# Patient Record
Sex: Female | Born: 1945
Health system: Southern US, Community
[De-identification: ages and names within clinical notes are randomized; demographics above are authoritative.]

## PROBLEM LIST (undated history)

## (undated) DIAGNOSIS — I10 Essential (primary) hypertension: Secondary | ICD-10-CM

## (undated) DIAGNOSIS — C801 Malignant (primary) neoplasm, unspecified: Secondary | ICD-10-CM

## (undated) DIAGNOSIS — N289 Disorder of kidney and ureter, unspecified: Secondary | ICD-10-CM

## (undated) DIAGNOSIS — K224 Dyskinesia of esophagus: Secondary | ICD-10-CM

## (undated) DIAGNOSIS — R0602 Shortness of breath: Secondary | ICD-10-CM

## (undated) DIAGNOSIS — T884XXA Failed or difficult intubation, initial encounter: Secondary | ICD-10-CM

## (undated) DIAGNOSIS — K219 Gastro-esophageal reflux disease without esophagitis: Secondary | ICD-10-CM

## (undated) DIAGNOSIS — I499 Cardiac arrhythmia, unspecified: Secondary | ICD-10-CM

## (undated) DIAGNOSIS — F32A Depression, unspecified: Secondary | ICD-10-CM

## (undated) DIAGNOSIS — F419 Anxiety disorder, unspecified: Secondary | ICD-10-CM

## (undated) DIAGNOSIS — N059 Unspecified nephritic syndrome with unspecified morphologic changes: Secondary | ICD-10-CM

## (undated) DIAGNOSIS — E119 Type 2 diabetes mellitus without complications: Secondary | ICD-10-CM

## (undated) DIAGNOSIS — T4145XA Adverse effect of unspecified anesthetic, initial encounter: Secondary | ICD-10-CM

## (undated) DIAGNOSIS — K8689 Other specified diseases of pancreas: Secondary | ICD-10-CM

## (undated) DIAGNOSIS — G8929 Other chronic pain: Secondary | ICD-10-CM

## (undated) DIAGNOSIS — J302 Other seasonal allergic rhinitis: Secondary | ICD-10-CM

## (undated) DIAGNOSIS — M1990 Unspecified osteoarthritis, unspecified site: Secondary | ICD-10-CM

## (undated) DIAGNOSIS — N183 Chronic kidney disease, stage 3 unspecified: Secondary | ICD-10-CM

## (undated) DIAGNOSIS — M545 Low back pain, unspecified: Secondary | ICD-10-CM

## (undated) DIAGNOSIS — F329 Major depressive disorder, single episode, unspecified: Secondary | ICD-10-CM

## (undated) DIAGNOSIS — M797 Fibromyalgia: Secondary | ICD-10-CM

## (undated) DIAGNOSIS — C259 Malignant neoplasm of pancreas, unspecified: Secondary | ICD-10-CM

## (undated) HISTORY — PX: TONSILLECTOMY: SUR1361

## (undated) HISTORY — DX: Essential (primary) hypertension: I10

## (undated) HISTORY — PX: LUMBAR DISC SURGERY: SHX700

## (undated) HISTORY — PX: CARPAL TUNNEL RELEASE: SHX101

## (undated) HISTORY — PX: CYST EXCISION: SHX5701

## (undated) HISTORY — DX: Malignant (primary) neoplasm, unspecified: C80.1

## (undated) HISTORY — PX: JOINT REPLACEMENT: SHX530

## (undated) HISTORY — DX: Gastro-esophageal reflux disease without esophagitis: K21.9

## (undated) HISTORY — PX: APPENDECTOMY: SHX54

## (undated) HISTORY — PX: COLONOSCOPY: SHX5424

## (undated) HISTORY — PX: ABDOMINAL HYSTERECTOMY: SHX81

---

## 1999-01-24 ENCOUNTER — Other Ambulatory Visit: Admission: RE | Admit: 1999-01-24 | Discharge: 1999-01-24 | Payer: Self-pay | Admitting: Obstetrics and Gynecology

## 2000-03-31 ENCOUNTER — Encounter: Admission: RE | Admit: 2000-03-31 | Discharge: 2000-03-31 | Payer: Self-pay | Admitting: Obstetrics and Gynecology

## 2000-03-31 ENCOUNTER — Encounter: Payer: Self-pay | Admitting: Obstetrics and Gynecology

## 2000-06-23 DIAGNOSIS — T8859XA Other complications of anesthesia, initial encounter: Secondary | ICD-10-CM

## 2000-06-23 DIAGNOSIS — T884XXA Failed or difficult intubation, initial encounter: Secondary | ICD-10-CM

## 2000-06-23 HISTORY — DX: Other complications of anesthesia, initial encounter: T88.59XA

## 2000-06-23 HISTORY — PX: SKIN GRAFT: SHX250

## 2000-06-23 HISTORY — PX: SQUAMOUS CELL CARCINOMA EXCISION: SHX2433

## 2000-06-23 HISTORY — DX: Failed or difficult intubation, initial encounter: T88.4XXA

## 2001-01-20 ENCOUNTER — Ambulatory Visit (HOSPITAL_BASED_OUTPATIENT_CLINIC_OR_DEPARTMENT_OTHER): Admission: RE | Admit: 2001-01-20 | Discharge: 2001-01-20 | Payer: Self-pay | Admitting: *Deleted

## 2001-02-08 ENCOUNTER — Encounter: Admission: RE | Admit: 2001-02-08 | Discharge: 2001-02-08 | Payer: Self-pay | Admitting: Specialist

## 2001-02-08 ENCOUNTER — Encounter: Payer: Self-pay | Admitting: Specialist

## 2001-02-26 ENCOUNTER — Encounter: Admission: RE | Admit: 2001-02-26 | Discharge: 2001-02-26 | Payer: Self-pay | Admitting: Specialist

## 2001-02-26 ENCOUNTER — Encounter: Payer: Self-pay | Admitting: Specialist

## 2001-03-01 ENCOUNTER — Encounter (INDEPENDENT_AMBULATORY_CARE_PROVIDER_SITE_OTHER): Payer: Self-pay | Admitting: *Deleted

## 2001-03-01 ENCOUNTER — Ambulatory Visit (HOSPITAL_BASED_OUTPATIENT_CLINIC_OR_DEPARTMENT_OTHER): Admission: RE | Admit: 2001-03-01 | Discharge: 2001-03-01 | Payer: Self-pay | Admitting: Specialist

## 2001-05-14 ENCOUNTER — Encounter: Payer: Self-pay | Admitting: Obstetrics and Gynecology

## 2001-05-14 ENCOUNTER — Other Ambulatory Visit: Admission: RE | Admit: 2001-05-14 | Discharge: 2001-05-14 | Payer: Self-pay | Admitting: Obstetrics and Gynecology

## 2001-05-14 ENCOUNTER — Encounter: Admission: RE | Admit: 2001-05-14 | Discharge: 2001-05-14 | Payer: Self-pay | Admitting: Obstetrics and Gynecology

## 2002-05-16 ENCOUNTER — Ambulatory Visit (HOSPITAL_COMMUNITY): Admission: RE | Admit: 2002-05-16 | Discharge: 2002-05-16 | Payer: Self-pay | Admitting: Obstetrics and Gynecology

## 2002-05-16 ENCOUNTER — Encounter: Payer: Self-pay | Admitting: Obstetrics and Gynecology

## 2003-05-19 ENCOUNTER — Ambulatory Visit (HOSPITAL_COMMUNITY): Admission: RE | Admit: 2003-05-19 | Discharge: 2003-05-19 | Payer: Self-pay | Admitting: Obstetrics and Gynecology

## 2004-05-21 ENCOUNTER — Ambulatory Visit (HOSPITAL_COMMUNITY): Admission: RE | Admit: 2004-05-21 | Discharge: 2004-05-21 | Payer: Self-pay | Admitting: Obstetrics and Gynecology

## 2005-05-01 ENCOUNTER — Encounter (HOSPITAL_COMMUNITY): Admission: RE | Admit: 2005-05-01 | Discharge: 2005-05-31 | Payer: Self-pay | Admitting: Neurosurgery

## 2005-06-05 ENCOUNTER — Ambulatory Visit (HOSPITAL_COMMUNITY): Admission: RE | Admit: 2005-06-05 | Discharge: 2005-06-05 | Payer: Self-pay | Admitting: Obstetrics and Gynecology

## 2006-06-08 ENCOUNTER — Ambulatory Visit (HOSPITAL_COMMUNITY): Admission: RE | Admit: 2006-06-08 | Discharge: 2006-06-08 | Payer: Self-pay | Admitting: Family Medicine

## 2007-06-21 ENCOUNTER — Ambulatory Visit (HOSPITAL_COMMUNITY): Admission: RE | Admit: 2007-06-21 | Discharge: 2007-06-21 | Payer: Self-pay | Admitting: Family Medicine

## 2008-07-04 ENCOUNTER — Ambulatory Visit (HOSPITAL_COMMUNITY): Admission: RE | Admit: 2008-07-04 | Discharge: 2008-07-04 | Payer: Self-pay | Admitting: Family Medicine

## 2009-07-05 ENCOUNTER — Ambulatory Visit (HOSPITAL_COMMUNITY): Admission: RE | Admit: 2009-07-05 | Discharge: 2009-07-05 | Payer: Self-pay | Admitting: Family Medicine

## 2010-07-01 ENCOUNTER — Encounter
Admission: RE | Admit: 2010-07-01 | Discharge: 2010-07-01 | Payer: Self-pay | Source: Home / Self Care | Attending: Orthopedic Surgery | Admitting: Orthopedic Surgery

## 2010-07-12 ENCOUNTER — Inpatient Hospital Stay (HOSPITAL_COMMUNITY)
Admission: RE | Admit: 2010-07-12 | Discharge: 2010-07-12 | Payer: Self-pay | Source: Home / Self Care | Attending: Neurosurgery | Admitting: Neurosurgery

## 2010-07-13 ENCOUNTER — Inpatient Hospital Stay (HOSPITAL_COMMUNITY)
Admission: RE | Admit: 2010-07-13 | Discharge: 2010-07-13 | Payer: Self-pay | Source: Home / Self Care | Attending: Neurosurgery | Admitting: Neurosurgery

## 2010-07-15 LAB — SURGICAL PCR SCREEN: MRSA, PCR: NEGATIVE

## 2010-07-15 LAB — CBC
MCH: 29.7 pg (ref 26.0–34.0)
MCHC: 32.4 g/dL (ref 30.0–36.0)
Platelets: 236 10*3/uL (ref 150–400)
RDW: 13.1 % (ref 11.5–15.5)

## 2010-07-15 LAB — BASIC METABOLIC PANEL
CO2: 30 mEq/L (ref 19–32)
Calcium: 10.2 mg/dL (ref 8.4–10.5)
Creatinine, Ser: 0.9 mg/dL (ref 0.4–1.2)
Glucose, Bld: 203 mg/dL — ABNORMAL HIGH (ref 70–99)
Sodium: 139 mEq/L (ref 135–145)

## 2010-07-16 LAB — GLUCOSE, CAPILLARY
Glucose-Capillary: 234 mg/dL — ABNORMAL HIGH (ref 70–99)
Glucose-Capillary: 135 mg/dL — ABNORMAL HIGH (ref 70–99)
Glucose-Capillary: 151 mg/dL — ABNORMAL HIGH (ref 70–99)

## 2010-07-16 LAB — URINALYSIS, ROUTINE W REFLEX MICROSCOPIC
Urine Glucose, Fasting: 500 mg/dL — AB
Urobilinogen, UA: 0.2 mg/dL (ref 0.0–1.0)

## 2010-08-16 NOTE — Op Note (Signed)
  Patricia Austin, ZELLNER NO.:  [id number]  MEDICAL RECORD NO.:  [id number]          PATIENT TYPE:  INP  LOCATION:  3001                         FACILITY:  [facility]  PHYSICIAN:  Coletta Memos, M.D.     DATE OF BIRTH:  1945/10/30  DATE OF PROCEDURE:  07/13/2010 DATE OF DISCHARGE:                              OPERATIVE REPORT   PREOPERATIVE DIAGNOSES:  Displaced disk left L4-5, left L4 radiculopathy.  POSTOPERATIVE DIAGNOSES:  Displaced disk left L4-5, left L4 radiculopathy.  PROCEDURE:  Left L4-7 semihemilaminectomy with microdissection and diskectomy.  COMPLICATIONS:  None.  SURGEON:  Coletta Memos, MD  ASSISTANT:  Lovell Sheehan.  INDICATIONS:  Kevin Docter presented to the office with severe pain in the back and left lower extremity.  She had significant weakness in the left hip flexors and in the quadriceps.  Great difficulty taking a step on the left leg leading with the left leg.  I therefore recommended, she agreed to undergo operative decompression secondary to the fragment of disk which was easily seen on the MRI which she presented with.  OPERATIVE NOTE:  Ms. Breisch was taken to the operating room, intubated and placed under general anesthetic without difficulty.  She was rolled prone onto a Wilson frame and all pressure points were properly padded. Her back was prepped and she was draped in a sterile fashion.  I infiltrated 10 mL 0.5% lidocaine with 1:200,000 strength epinephrine into the lumbar region.  I opened the skin with #10 blade and took this down to the thoracolumbar fascia.  I exposed the lamina of L4 and L5 on the left side.  I then proceeded with semihemilaminectomy of L4 using a high-speed drill and Kerrison punch.  I removed ligamentum flavum which was somewhat thick hardened and redundant.  I removed that exposing the thecal sac.  I brought the microscope into the operative field.  I then able to identify the thecal sac, the disk space and what  was a palpable lump which was still contained rostral to the disk space.  I was able to see the L4 root.  I then opened that lump.  I then removed the fragments of disk material.  I used various size right angle probes to ensure that I was not leaving any disk material behind and I was not.  Dr. Lovell Sheehan assisted with the inspection and diskectomy.  I then satisfied myself that there was no more pressure in her nerve and also satisfied myself that there was no benefit to going into her disk space, although it was quite degenerated.  I irrigated the wound.  I then closed the wound in layered fashion using Vicryl sutures.  I used Dermabond for sterile dressing.          ______________________________ Coletta Memos, M.D.     KC/MEDQ  D:  07/13/2010  T:  07/13/2010  Job:  [id number]  Electronically Signed by Coletta Memos M.D. on 08/16/2010 09:20:34 AM

## 2010-08-18 NOTE — Discharge Summary (Signed)
  NAMECITLALLI, WEIKEL NO.:  [id number]  MEDICAL RECORD NO.:  [id number]           PATIENT TYPE:  LOCATION:                                 FACILITY:  PHYSICIAN:  Coletta Memos, M.D.     DATE OF BIRTH:  Aug 31, 1945  DATE OF ADMISSION: DATE OF DISCHARGE:                              DISCHARGE SUMMARY   ADMITTING DIAGNOSIS:  Metallic bur hole cover.  DISCHARGE DIAGNOSIS:  Metallic bur hole cover.  PROCEDURE:  Removal of metallic bur hole cover.  COMPLICATIONS:  None.  DISCHARGE STATUS:  Alive and well.  DISCHARGE DESTINATION:  Home.  MEDICATIONS:  None.  INSTRUCTIONS:  May wash scalp tomorrow.  Do not directly wash incision, water may hit it.  COMPLICATIONS AT DISCHARGE:  None.          ______________________________ Coletta Memos, M.D.     KC/MEDQ  D:  08/16/2010  T:  08/16/2010  Job:  [id number]  Electronically Signed by Coletta Memos M.D. on 08/18/2010 12:11:57 AM

## 2010-09-24 ENCOUNTER — Other Ambulatory Visit: Payer: Self-pay | Admitting: Neurosurgery

## 2010-09-24 DIAGNOSIS — M545 Low back pain: Secondary | ICD-10-CM

## 2010-09-30 ENCOUNTER — Other Ambulatory Visit: Payer: Self-pay

## 2010-09-30 ENCOUNTER — Ambulatory Visit
Admission: RE | Admit: 2010-09-30 | Discharge: 2010-09-30 | Disposition: A | Discharge status: Home / Self Care | Payer: Medicare Other | Source: Ambulatory Visit | Attending: Neurosurgery | Admitting: Neurosurgery

## 2010-09-30 DIAGNOSIS — M545 Low back pain: Secondary | ICD-10-CM

## 2010-09-30 MED ORDER — GADOBENATE DIMEGLUMINE 529 MG/ML IV SOLN
17.0000 mL | Freq: Once | INTRAVENOUS | Status: AC | PRN
  Administered 2010-09-30: 17 mL via INTRAVENOUS

## 2010-10-17 ENCOUNTER — Encounter (HOSPITAL_COMMUNITY)
Admission: RE | Admit: 2010-10-17 | Discharge: 2010-10-17 | Disposition: A | Discharge status: Home / Self Care | Payer: Medicare Other | Source: Ambulatory Visit | Attending: Neurosurgery | Admitting: Neurosurgery

## 2010-10-17 LAB — BASIC METABOLIC PANEL
GFR calc Af Amer: >60 mL/min (ref >60)
GFR calc non Af Amer: 59 mL/min — ABNORMAL LOW (ref >60)
Glucose, Bld: 233 mg/dL — ABNORMAL HIGH (ref 70–99)

## 2010-10-17 LAB — SURGICAL PCR SCREEN: MRSA, PCR: NEGATIVE

## 2010-10-17 LAB — CBC
HCT: 35.6 % — ABNORMAL LOW (ref 36.0–46.0)
Hemoglobin: 11.9 g/dL — ABNORMAL LOW (ref 12.0–15.0)
MCH: 30.8 pg (ref 26.0–34.0)
MCHC: 33.4 g/dL (ref 30.0–36.0)
MCV: 92.2 fL (ref 78.0–100.0)
Platelets: 251 10*3/uL (ref 150–400)

## 2010-10-23 ENCOUNTER — Observation Stay (HOSPITAL_COMMUNITY)
Admission: RE | Admit: 2010-10-23 | Discharge: 2010-10-23 | Disposition: A | Discharge status: Home / Self Care | Payer: Medicare Other | Source: Ambulatory Visit | Attending: Neurosurgery | Admitting: Neurosurgery

## 2010-10-23 ENCOUNTER — Inpatient Hospital Stay (HOSPITAL_COMMUNITY): Payer: Medicare Other

## 2010-10-23 DIAGNOSIS — Z01812 Encounter for preprocedural laboratory examination: Secondary | ICD-10-CM | POA: Insufficient documentation

## 2010-10-23 DIAGNOSIS — I10 Essential (primary) hypertension: Secondary | ICD-10-CM | POA: Insufficient documentation

## 2010-10-23 DIAGNOSIS — E119 Type 2 diabetes mellitus without complications: Secondary | ICD-10-CM | POA: Insufficient documentation

## 2010-10-23 DIAGNOSIS — E669 Obesity, unspecified: Secondary | ICD-10-CM | POA: Insufficient documentation

## 2010-10-23 DIAGNOSIS — M5126 Other intervertebral disc displacement, lumbar region: Principal | ICD-10-CM | POA: Insufficient documentation

## 2010-10-23 LAB — GLUCOSE, CAPILLARY: Glucose-Capillary: 202 mg/dL — ABNORMAL HIGH (ref 70–99)

## 2010-10-23 LAB — TYPE AND SCREEN
ABO/RH(D): A POS
Antibody Screen: NEGATIVE

## 2010-11-08 NOTE — Op Note (Signed)
NAMEJANAYLA, MARIK NO.:  [id number]  MEDICAL RECORD NO.:  [id number]           PATIENT TYPE:  O  LOCATION:  3524                         FACILITY:  [facility]  PHYSICIAN:  Coletta Memos, M.D.     DATE OF BIRTH:  May 26, 1946  DATE OF PROCEDURE:  10/23/2010 DATE OF DISCHARGE:  10/23/2010                              OPERATIVE REPORT   PREOPERATIVE DIAGNOSIS:  Recurrent disk herniation, left L4-L5.  POSTOPERATIVE DIAGNOSIS:  Recurrent disk herniation, left L4-L5.  PROCEDURE:  Redo left L4-L5 semi-hemilaminectomy and diskectomy with microdissection.  SURGEON:  Coletta Memos, MD  ASSISTANT:  Hewitt Shorts, MD  COMPLICATIONS:  None.  FINDINGS:  Fragment of disk above the disk space with significant amount of surrounding scar tissue.  INDICATIONS:  Patricia Austin is a 65 year old who in January underwent a lumbar diskectomy at L4-L5.  She had very good relief of pain until approximately 5 weeks ago.  She has severe recurrence of pain.  MRI showed a recurrent disk herniation.  We discussed in length fusion versus a simple redo diskectomy.  Patricia Austin made a decision to simply undergo a redo diskectomy.  OPERATIVE NOTE:  Patricia Austin was brought to the operating room, intubated, and placed under a general anesthetic.  She was rolled prone onto a Wilson frame and all pressure points were properly padded.  Her back was prepped and she was draped in a sterile fashion.  I opened the old incision with a #10 blade and took this down to the thoracolumbar fascia.  I then exposed the lamina of L3, L4, and L5.  I confirmed our location intraoperatively with x-ray.  I then used a curved curette to free the soft tissue from the bony edges.  I used a Kerrison punch to remove more bone from both L4 and the lateral interlaminar space at L4- L5.  I then performed some more of the medial facetectomy.  Again, I was able to then find the disk space in a layer of the membranous  tissue overlying the disk material which was not in the disk space.  I was able to remove that using blunt hooks and a pituitary rongeur.  Dr. Newell Austin assisted with the decompression.  We observed what we felt was the disk space.  The recurrent disk was not in the disk space but was migrated rostrally from the disk space.  I still did not believe in Patricia Austin's best interest to explore the disk space, so we left that alone.  The nerve root was well decompressed.  I then irrigated the wound.  I then placed some fentanyl over the resection site.  I then closed the wound in layered fashion using Vicryl sutures.  The operative microscope was used during the case to help with operative dissection, microdissection of the nerve root, and disk material.  I closed the wound in layered fashion using Vicryls.  I used Dermabond for sterile dressing.  Patricia Austin tolerated the procedure well.          ______________________________ Coletta Memos, M.D.     KC/MEDQ  D:  10/23/2010  T:  10/24/2010  Job:  [id number]  Electronically Signed by Coletta Memos M.D. on 11/08/2010 11:42:00 AM

## 2010-11-08 NOTE — Op Note (Signed)
[place]  Patient:    Patricia Austin, LOUREIRO Visit Number: [id number] MRN: [id number]          Service Type: Attending:  Yaakov Guthrie. Shon Hough, M.D. Dictated by:   Yaakov Guthrie. Shon Hough, M.D. Proc. Date: 03/01/01   CC:         Maisie Fus B. Samuella Cota, M.D.   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  The defect presently measures approximately 1.5 x 1.0-2.0 cm.  PROCEDURE:  Wide excision, bilateral horizontal flap advancements.  SURGEON:  Yaakov Guthrie. Shon Hough, M.D.  ASSISTANTS: Donnie Coffin. Samuella Cota, M.D. Margaretha Sheffield, R.N.  ANESTHESIA:  General.  DESCRIPTION OF PROCEDURE:  The patient underwent general anesthesia and intubated orally.  Prep was done to the face, scalp and neck area with Betadine soaping solution after hair had been shaven and walled off with sterile towels and drapes so as to make a sterile field.  Marking pin was used to outline a wide, wide excision of the area down to underlying periosteum. Specimen was sent to pathology for examination.  Hemostasis was maintained with the Bovie unit on coagulation after the areas had been locally injected with 0.5% Xylocaine with epinephrine 1:200,000 concentration, a total of 30 cc.  After proper hemostasis, bilateral horizontal flaps were outlined based over the temporalis artery.  Flaps were then freed out all the way to their bases and a small bat cut made on each side.  There was increased resistance to closure initially and after freeing up the flaps more, the galea was scored from inside with some of the musculature of the scalp to allow [place] to get more distance.  Closure was done with a 0 nylon, then subcutaneous closure was done with 0 Vicryl and then the skin reapproximated with skin staples.  The wounds were drained with the #10 Blake drain, which was placed in the depths of the wound and brought out through the  lateral-most incision at the lower part of the incisions.  The wounds were cleansed, sterile dressings applied.  She withstood the procedures very well and was then taken to recovery in excellent condition.  Estimated blood loss:  150 cc. Complications:  None. Dictated by:   Yaakov Guthrie. Shon Hough, M.D. Attending:  Yaakov Guthrie. Shon Hough, M.D. DD:  03/01/01 TD:  03/01/01 Job: [id number]

## 2010-11-28 NOTE — Discharge Summary (Signed)
  NAMEELVIRA, [name] NO.:  [id number]  MEDICAL RECORD NO.:  [id number]           PATIENT TYPE:  O  LOCATION:  3524                         FACILITY:  [facility]  PHYSICIAN:  Coletta Memos, M.D.     DATE OF BIRTH:  06/19/1946  DATE OF ADMISSION:  10/23/2010 DATE OF DISCHARGE:  10/23/2010                              DISCHARGE SUMMARY   ADMITTING DIAGNOSIS:  Recurrent disk herniation, left L4-L5.  DISCHARGE DIAGNOSIS:  Recurrent disk herniation, left L4-L5.  PROCEDURE:  Redo left L4-L5 semi-hemilaminectomy and diskectomy with microdissection.  COMPLICATIONS:  None.  DISCHARGE STATUS:  Alive and well.  DISCHARGE DESTINATION:  Home.  MEDICATIONS:  Percocet and Flexeril.  INDICATIONS:  [name] was admitted to the hospital secondary to a recurrent disk herniation at L4-L5.  She was given the option of possible fusion or just simple redo diskectomy.  She has opted for redo diskectomy.  Her procedure went well on the left side at L4-L5.  Postop, she continued to do well.  Wound was clean, dry, no signs of infection. At discharge, she was able to void and ambulate and tolerated a regular diet.  I will see her in the office again in 3-4 weeks.  She was given a instruction sheet.  No driving for 10 days.  No heavy lifting, bending, or twisting.          ______________________________ Coletta Memos, M.D.     KC/MEDQ  D:  11/08/2010  T:  11/09/2010  Job:  [id number]  Electronically Signed by Coletta Memos M.D. on 11/28/2010 03:08:25 PM

## 2011-12-12 ENCOUNTER — Other Ambulatory Visit: Payer: Self-pay | Admitting: Nurse Practitioner

## 2011-12-12 DIAGNOSIS — Z1231 Encounter for screening mammogram for malignant neoplasm of breast: Secondary | ICD-10-CM

## 2012-01-06 ENCOUNTER — Ambulatory Visit (HOSPITAL_COMMUNITY): Payer: Medicare Other

## 2012-01-22 ENCOUNTER — Ambulatory Visit (HOSPITAL_COMMUNITY)
Admission: RE | Admit: 2012-01-22 | Discharge: 2012-01-22 | Disposition: A | Discharge status: Home / Self Care | Payer: Medicare Other | Source: Ambulatory Visit | Attending: Nurse Practitioner | Admitting: Nurse Practitioner

## 2012-01-22 DIAGNOSIS — Z1231 Encounter for screening mammogram for malignant neoplasm of breast: Secondary | ICD-10-CM | POA: Insufficient documentation

## 2012-09-07 ENCOUNTER — Other Ambulatory Visit: Payer: Self-pay | Admitting: Nurse Practitioner

## 2012-09-07 DIAGNOSIS — E118 Type 2 diabetes mellitus with unspecified complications: Secondary | ICD-10-CM

## 2012-09-07 MED ORDER — ONETOUCH ULTRA SYSTEM W/DEVICE KIT: 1.0000 | PACK | Freq: Once | Status: DC

## 2012-10-11 ENCOUNTER — Other Ambulatory Visit: Payer: Self-pay | Admitting: Nurse Practitioner

## 2012-11-08 ENCOUNTER — Other Ambulatory Visit: Payer: Self-pay | Admitting: Nurse Practitioner

## 2012-11-10 ENCOUNTER — Other Ambulatory Visit: Payer: Self-pay | Admitting: Nurse Practitioner

## 2012-11-11 NOTE — Telephone Encounter (Signed)
LAST OV 2/14. 

## 2012-12-06 ENCOUNTER — Other Ambulatory Visit: Payer: Self-pay | Admitting: Nurse Practitioner

## 2013-01-05 ENCOUNTER — Telehealth: Payer: Self-pay | Admitting: Nurse Practitioner

## 2013-01-06 NOTE — Telephone Encounter (Signed)
Ok to wait until appointment for labs

## 2013-01-06 NOTE — Telephone Encounter (Signed)
Please advise 

## 2013-01-06 NOTE — Telephone Encounter (Signed)
Patient takes injections once a week., They are costing her 133.00. Wants to have labs done in the am before she goes out of town and if it doesn't appear to be working then she wants to stop because she can't afford it.

## 2013-01-06 NOTE — Telephone Encounter (Signed)
Patricia Austin put her on Bydureon 2mg /ml weekly.

## 2013-01-06 NOTE — Telephone Encounter (Signed)
Pts appt moved to earlier date- will ck labs then

## 2013-01-06 NOTE — Telephone Encounter (Signed)
What is she taking?

## 2013-01-13 ENCOUNTER — Telehealth: Payer: Self-pay | Admitting: Nurse Practitioner

## 2013-01-13 ENCOUNTER — Other Ambulatory Visit: Payer: Self-pay | Admitting: Nurse Practitioner

## 2013-01-14 ENCOUNTER — Telehealth: Payer: Self-pay | Admitting: Nurse Practitioner

## 2013-01-14 NOTE — Telephone Encounter (Signed)
Samples left in lab refrigerator of bydueron. We just need to know if she needs refill on other medications since we do not receive samples of those meds.

## 2013-01-14 NOTE — Telephone Encounter (Signed)
LMOM with details, sample available. Call pharmacy if refills needed.

## 2013-01-14 NOTE — Telephone Encounter (Signed)
Left details for pt, sample available,call pharmacy to request other medication refills.

## 2013-01-17 NOTE — Telephone Encounter (Signed)
No regular labs since 12/24/11, only one check with Marcelino Duster

## 2013-01-20 ENCOUNTER — Ambulatory Visit: Payer: Self-pay | Admitting: Nurse Practitioner

## 2013-02-03 ENCOUNTER — Telehealth: Payer: Self-pay | Admitting: Nurse Practitioner

## 2013-02-04 NOTE — Telephone Encounter (Signed)
appt made

## 2013-02-07 ENCOUNTER — Ambulatory Visit: Payer: Self-pay | Admitting: Nurse Practitioner

## 2013-02-09 ENCOUNTER — Encounter: Payer: Self-pay | Admitting: Nurse Practitioner

## 2013-02-09 ENCOUNTER — Ambulatory Visit (INDEPENDENT_AMBULATORY_CARE_PROVIDER_SITE_OTHER): Payer: Medicare Other | Admitting: Nurse Practitioner

## 2013-02-09 VITALS — BP 140/78 | HR 100 | Temp 97.5°F | Ht 63.0 in | Wt 196.5 lb

## 2013-02-09 DIAGNOSIS — K219 Gastro-esophageal reflux disease without esophagitis: Secondary | ICD-10-CM

## 2013-02-09 DIAGNOSIS — C4442 Squamous cell carcinoma of skin of scalp and neck: Secondary | ICD-10-CM

## 2013-02-09 DIAGNOSIS — M19079 Primary osteoarthritis, unspecified ankle and foot: Secondary | ICD-10-CM

## 2013-02-09 DIAGNOSIS — E119 Type 2 diabetes mellitus without complications: Secondary | ICD-10-CM | POA: Insufficient documentation

## 2013-02-09 DIAGNOSIS — J309 Allergic rhinitis, unspecified: Secondary | ICD-10-CM

## 2013-02-09 DIAGNOSIS — E785 Hyperlipidemia, unspecified: Secondary | ICD-10-CM | POA: Insufficient documentation

## 2013-02-09 DIAGNOSIS — M1710 Unilateral primary osteoarthritis, unspecified knee: Secondary | ICD-10-CM

## 2013-02-09 DIAGNOSIS — M19071 Primary osteoarthritis, right ankle and foot: Secondary | ICD-10-CM

## 2013-02-09 DIAGNOSIS — I10 Essential (primary) hypertension: Secondary | ICD-10-CM

## 2013-02-09 DIAGNOSIS — M170 Bilateral primary osteoarthritis of knee: Secondary | ICD-10-CM

## 2013-02-09 DIAGNOSIS — IMO0001 Reserved for inherently not codable concepts without codable children: Secondary | ICD-10-CM

## 2013-02-09 LAB — POCT GLYCOSYLATED HEMOGLOBIN (HGB A1C): Hemoglobin A1C: 7.3

## 2013-02-09 MED ORDER — GLIPIZIDE-METFORMIN HCL 2.5-500 MG PO TABS: ORAL_TABLET | ORAL | Status: DC

## 2013-02-09 MED ORDER — DULOXETINE HCL 60 MG PO CPEP: ORAL_CAPSULE | ORAL | Status: DC

## 2013-02-09 MED ORDER — HYDROCODONE-ACETAMINOPHEN 7.5-325 MG/15ML PO SOLN: 15.0000 mL | Freq: Four times a day (QID) | ORAL | Status: DC | PRN

## 2013-02-09 MED ORDER — CARVEDILOL 25 MG PO TABS: 25.0000 mg | ORAL_TABLET | Freq: Every day | ORAL | Status: DC

## 2013-02-09 MED ORDER — BENAZEPRIL HCL 40 MG PO TABS: 40.0000 mg | ORAL_TABLET | Freq: Every day | ORAL | Status: DC

## 2013-02-09 MED ORDER — DICLOFENAC SODIUM 75 MG PO TBEC: 75.0000 mg | DELAYED_RELEASE_TABLET | Freq: Two times a day (BID) | ORAL | Status: DC

## 2013-02-09 NOTE — Patient Instructions (Signed)

## 2013-02-09 NOTE — Addendum Note (Signed)
Addended by: Bennie Pierini on: 02/09/2013 04:36 PM   Modules accepted: Orders

## 2013-02-09 NOTE — Progress Notes (Signed)
Subjective:    Patient ID: Patricia Austin, female    DOB: 01-08-1946, 66 y.o.   MRN: [id number]  Hypertension This is a chronic problem. The current episode started more than 1 year ago. The problem is unchanged. The problem is uncontrolled (patient doesn't check at home.). Pertinent negatives include no blurred vision, chest pain, headaches, neck pain, palpitations, peripheral edema, shortness of breath or sweats. There are no associated agents to hypertension. Risk factors for coronary artery disease include diabetes mellitus, dyslipidemia, family history, obesity, post-menopausal state and sedentary lifestyle. Past treatments include beta blockers, ACE inhibitors and diuretics. The current treatment provides mild improvement. Compliance problems include diet and exercise.   Hyperlipidemia This is a chronic problem. The current episode started more than 1 year ago. The problem is uncontrolled. Recent lipid tests were reviewed and are high. There are no known factors aggravating her hyperlipidemia. Associated symptoms include myalgias. Pertinent negatives include no chest pain or shortness of breath. She is currently on no antihyperlipidemic treatment (patient has never wanted toi take.). Compliance problems include adherence to diet and adherence to exercise.  Risk factors for coronary artery disease include diabetes mellitus, family history, hypertension, obesity, post-menopausal and a sedentary lifestyle.  Diabetes She presents for her follow-up diabetic visit. She has type 2 diabetes mellitus. No MedicAlert identification noted. The initial diagnosis of diabetes was made 15 years ago. Her disease course has been fluctuating. There are no hypoglycemic associated symptoms. Pertinent negatives for hypoglycemia include no headaches or sweats. Pertinent negatives for diabetes include no blurred vision and no chest pain. There are no hypoglycemic complications. Symptoms are stable. There are no diabetic  complications. Risk factors for coronary artery disease include dyslipidemia, family history, hypertension, obesity, post-menopausal and sedentary lifestyle. Current diabetic treatment includes oral agent (dual therapy) (was on bydureon but has stopped due to expense.). She is compliant with treatment some of the time. Her weight is stable. When asked about meal planning, she reported none. She has not had a previous visit with a dietician. She rarely participates in exercise. Her home blood glucose trend is fluctuating minimally. Her breakfast blood glucose range is generally 130-140 mg/dl. Her highest blood glucose is >200 mg/dl. Her overall blood glucose range is 130-140 mg/dl. An ACE inhibitor/angiotensin II receptor blocker is being taken. She does not see a podiatrist.Eye exam is not current (2013).  Fibromyalgia/periperal neuropathy Cymbalta working ok but would like to increase dose because still has achiness all over- she also takes neurotin which helps some. * History of squamous cell carcinoma- needs follow up CT scan  Review of Systems  Constitutional: Negative.   HENT: Negative for neck pain.   Eyes: Negative.  Negative for blurred vision.  Respiratory: Negative.  Negative for shortness of breath.   Cardiovascular: Negative for chest pain and palpitations.  Genitourinary: Negative.   Musculoskeletal: Positive for myalgias, back pain and arthralgias.  Neurological: Negative for headaches.       Objective:   Physical Exam  Constitutional: She is oriented to person, place, and time. She appears well-developed and well-nourished.  HENT:  Nose: Nose normal.  Mouth/Throat: Oropharynx is clear and moist.  Eyes: EOM are normal.  Neck: Trachea normal, normal range of motion and full passive range of motion without pain. Neck supple. No JVD present. Carotid bruit is not present. No thyromegaly present.  Cardiovascular: Normal rate, regular rhythm, normal heart sounds and intact distal  pulses.  Exam reveals no gallop and no friction rub.   No murmur  heard. Pulmonary/Chest: Effort normal and breath sounds normal.  Abdominal: Soft. Bowel sounds are normal. She exhibits no distension and no mass. There is no tenderness.  Musculoskeletal: Normal range of motion.  Lymphadenopathy:    She has no cervical adenopathy.  Neurological: She is alert and oriented to person, place, and time. She has normal reflexes.  Skin: Skin is warm and dry.  Scalp scar tender on right- no edema- no discoloration  Psychiatric: She has a normal mood and affect. Her behavior is normal. Judgment and thought content normal.    BP 140/78  Pulse 100  Temp(Src) 97.5 F (36.4 C) (Oral)  Ht 5\' 3"  (1.6 m)  Wt 196 lb 8 oz (89.132 kg)  BMI 34.82 kg/m2 Results for orders placed in visit on 02/09/13  POCT GLYCOSYLATED HEMOGLOBIN (HGB A1C)      Result Value Range   Hemoglobin A1C 7.3%           Assessment & Plan:  1. HTN (hypertension) Low Na+ diet - CMP14+EGFR - carvedilol (COREG) 25 MG tablet; Take 1 tablet (25 mg total) by mouth daily.  Dispense: 30 tablet; Refill: 5 - benazepril (LOTENSIN) 40 MG tablet; Take 1 tablet (40 mg total) by mouth daily.  Dispense: 30 tablet; Refill: 5  2. Type II or unspecified type diabetes mellitus without mention of complication, uncontrolled Count crabs - POCT glycosylated hemoglobin (Hb A1C) - glipiZIDE-metformin (METAGLIP) 2.5-500 MG per tablet; 2 PO BID  Dispense: 120 tablet; Refill: 2 - POCT UA - Microalbumin  3. Morbid obesity Discussed diet  Encouraged exercsie  4. Hyperlipidemia with target LDL less than 100 Low fat diet - NMR, lipoprofile  5. Osteoarthritis of both feet Keep follow up with ortho  6. Osteoarthritis of both knees Rest when can - HYDROcodone-acetaminophen (HYCET) 7.5-325 mg/15 ml solution; Take 15 mL by mouth 4 (four) times daily as needed for pain.  Dispense: 120 mL; Refill: 0 - diclofenac (VOLTAREN) 75 MG EC tablet; Take 1  tablet (75 mg total) by mouth 2 (two) times daily.  Dispense: 60 tablet; Refill: 3  7. GERD (gastroesophageal reflux disease) Avoid spicy and fatty foods  8. Allergic rhinitis OTC meds as needed   9. Squamous cell carcinoma of scalp Will schedule scalp CT when patient returns from vacation  Mary-Margaret Daphine Deutscher, FNP

## 2013-02-10 ENCOUNTER — Other Ambulatory Visit: Payer: Self-pay | Admitting: Nurse Practitioner

## 2013-02-10 ENCOUNTER — Telehealth: Payer: Self-pay | Admitting: Nurse Practitioner

## 2013-02-10 MED ORDER — HYDROCODONE-ACETAMINOPHEN 5-325 MG PO TABS: 1.0000 | ORAL_TABLET | Freq: Four times a day (QID) | ORAL | Status: DC | PRN

## 2013-02-10 NOTE — Telephone Encounter (Signed)
Pt aware.

## 2013-02-10 NOTE — Telephone Encounter (Signed)
You were concerned with her blood pressure and intended to increase her meds. She has been taking one half pill in morning and same in the evening. Script for one daily didn't change anything. Her cymbalta cost her $127 which was twice the cost of before. It looks like you ordered a liquid hydrocone per meds but no printed script has come through.  Pt definitely wants hydrocodone pill (like the ones she took of her husband's). They are going out of town tomorrow. She hopes to pick up pain med script by morning.

## 2013-02-10 NOTE — Telephone Encounter (Signed)
rx ready for pick up for pain meds- Continue same dose of coreg for blood pressure- Sorry about cymbalta- once outpf donut hole it will be cheaper.

## 2013-02-11 LAB — NMR, LIPOPROFILE
HDL Cholesterol by NMR: 55 mg/dL (ref >=40)
LDL Size: 21.5 nm (ref >20.5)
LP-IR Score: <25 (ref <=45)
Small LDL Particle Number: 241 nmol/L (ref <=527)

## 2013-02-11 LAB — CMP14+EGFR
ALT: 22 IU/L (ref 0–32)
AST: 23 IU/L (ref 0–40)
Albumin/Globulin Ratio: 1.7 (ref 1.1–2.5)
Alkaline Phosphatase: 119 IU/L — ABNORMAL HIGH (ref 39–117)
BUN/Creatinine Ratio: 15 (ref 11–26)
CO2: 27 mmol/L (ref 18–29)
Calcium: 10.3 mg/dL — ABNORMAL HIGH (ref 8.6–10.2)
GFR calc non Af Amer: 54 mL/min/{1.73_m2} — ABNORMAL LOW (ref >59)
Potassium: 5.1 mmol/L (ref 3.5–5.2)
Sodium: 143 mmol/L (ref 134–144)

## 2013-02-22 MED ORDER — HYDROCODONE-ACETAMINOPHEN 5-325 MG PO TABS: 1.0000 | ORAL_TABLET | Freq: Four times a day (QID) | ORAL | Status: DC | PRN

## 2013-03-02 ENCOUNTER — Other Ambulatory Visit: Payer: Self-pay | Admitting: Family Medicine

## 2013-03-16 ENCOUNTER — Other Ambulatory Visit: Payer: Self-pay | Admitting: Pharmacist

## 2013-03-16 MED ORDER — EXENATIDE ER 2 MG ~~LOC~~ SUSR: 2.0000 mg | SUBCUTANEOUS | Status: DC

## 2013-04-06 ENCOUNTER — Ambulatory Visit (INDEPENDENT_AMBULATORY_CARE_PROVIDER_SITE_OTHER): Payer: Medicare Other | Admitting: General Practice

## 2013-04-06 VITALS — BP 140/83 | HR 75 | Temp 97.7°F | Wt 196.0 lb

## 2013-04-06 DIAGNOSIS — R739 Hyperglycemia, unspecified: Secondary | ICD-10-CM

## 2013-04-06 DIAGNOSIS — R7309 Other abnormal glucose: Secondary | ICD-10-CM

## 2013-04-06 LAB — POCT URINALYSIS DIPSTICK
Leukocytes, UA: NEGATIVE
Nitrite, UA: NEGATIVE
Urobilinogen, UA: NEGATIVE
pH, UA: 5

## 2013-04-06 LAB — GLUCOSE, POCT (MANUAL RESULT ENTRY): POC Glucose: 280 mg/dl — AB (ref 70–99)

## 2013-04-06 MED ORDER — INSULIN ASPART 100 UNIT/ML ~~LOC~~ SOLN: 10.0000 [IU] | Freq: Once | SUBCUTANEOUS | Status: DC

## 2013-04-06 NOTE — Progress Notes (Signed)
  Subjective:    Patient ID: Patricia Austin, female    DOB: December 09, 1945, 67 y.o.   MRN: [id number]  HPI Patient presents today with complaints of elevated blood sugars. Reports checking blood sugar 2-3 hours ago and reading was 375. She denies having bydureon nsulin to administer for two weeks. She reports taking oral antidiabetic medications as directed.     Review of Systems  Constitutional: Negative for fever and chills.  Respiratory: Negative for cough, chest tightness, shortness of breath and wheezing.   Cardiovascular: Negative for chest pain and palpitations.  Gastrointestinal: Negative for abdominal pain, diarrhea, constipation and blood in stool.  Endocrine: Negative for polydipsia, polyphagia and polyuria.  Genitourinary: Negative for dysuria, hematuria and difficulty urinating.  Musculoskeletal: Negative for back pain, neck pain and neck stiffness.  Neurological: Negative for dizziness, weakness and headaches.       Objective:   Physical Exam  Constitutional: She is oriented to person, place, and time. She appears well-developed and well-nourished.  HENT:  Head: Normocephalic and atraumatic.  Right Ear: External ear normal.  Left Ear: External ear normal.  Nose: Nose normal.  Mouth/Throat: Oropharynx is clear and moist.  Eyes: EOM are normal. Pupils are equal, round, and reactive to light.  Neck: Normal range of motion. Neck supple. No thyromegaly present.  Cardiovascular: Normal rate, regular rhythm and normal heart sounds.   Pulmonary/Chest: Effort normal and breath sounds normal. No respiratory distress. She exhibits no tenderness.  Abdominal: Soft. Bowel sounds are normal. She exhibits no distension. There is no tenderness.  Musculoskeletal: She exhibits no edema and no tenderness.  Lymphadenopathy:    She has no cervical adenopathy.  Neurological: She is alert and oriented to person, place, and time.  Skin: Skin is warm and dry.  Psychiatric: She has a normal mood and  affect.          Results for orders placed in visit on 04/06/13  POCT URINALYSIS DIPSTICK      Result Value Range   Color, UA gold     Clarity, UA clear     Glucose, UA 1+     Bilirubin, UA negative     Ketones, UA moderate     Spec Grav, UA >=1.030     Blood, UA negative     pH, UA 5.0     Protein, UA 4+     Urobilinogen, UA negative     Nitrite, UA negative     Leukocytes, UA Negative    GLUCOSE, POCT (MANUAL RESULT ENTRY)      Result Value Range   POC Glucose 280 (*) 70 - 99 mg/dl     Assessment & Plan:  1. Elevated blood sugar  - Urinalysis Dipstick - Glucose (CBG) - POCT glucose (manual entry) -discussed with patient urine results and with ketones present she should go hospital for evaluation -patient refuses to be evaluated at hospital - insulin aspart (novoLOG) injection 10 Units; Inject 10 Units into the skin once. -blood sugar rechecked and down to 255 -encouraged patient to hydrate -patient has bydureon to self administer -check blood sugar as directed once home -seek emergency medical treatment if symptoms worsen -Patient verbalized understanding -Coralie Keens, FNP-C

## 2013-04-19 ENCOUNTER — Telehealth: Payer: Self-pay | Admitting: Nurse Practitioner

## 2013-04-20 MED ORDER — EXENATIDE ER 2 MG ~~LOC~~ PEN: 2.0000 mg | PEN_INJECTOR | SUBCUTANEOUS | Status: DC

## 2013-04-20 NOTE — Telephone Encounter (Signed)
rx was faxed to [facility] and patient notified.

## 2013-05-02 ENCOUNTER — Telehealth: Payer: Self-pay | Admitting: Pharmacist

## 2013-05-02 NOTE — Telephone Encounter (Signed)
Received paper work from [employer].  Filled out and faxed today.

## 2013-05-05 ENCOUNTER — Other Ambulatory Visit: Payer: Self-pay | Admitting: Nurse Practitioner

## 2013-05-05 DIAGNOSIS — Z1231 Encounter for screening mammogram for malignant neoplasm of breast: Secondary | ICD-10-CM

## 2013-05-09 ENCOUNTER — Other Ambulatory Visit: Payer: Self-pay | Admitting: Nurse Practitioner

## 2013-05-10 ENCOUNTER — Other Ambulatory Visit: Payer: Self-pay | Admitting: Nurse Practitioner

## 2013-05-20 ENCOUNTER — Ambulatory Visit (HOSPITAL_COMMUNITY)
Admission: RE | Admit: 2013-05-20 | Discharge: 2013-05-20 | Disposition: A | Discharge status: Home / Self Care | Payer: Medicare Other | Source: Ambulatory Visit | Attending: Nurse Practitioner | Admitting: Nurse Practitioner

## 2013-05-20 DIAGNOSIS — Z1231 Encounter for screening mammogram for malignant neoplasm of breast: Secondary | ICD-10-CM | POA: Insufficient documentation

## 2013-05-27 ENCOUNTER — Ambulatory Visit (INDEPENDENT_AMBULATORY_CARE_PROVIDER_SITE_OTHER): Payer: Medicare Other | Admitting: Nurse Practitioner

## 2013-05-27 ENCOUNTER — Encounter: Payer: Self-pay | Admitting: Nurse Practitioner

## 2013-05-27 VITALS — BP 136/77 | HR 81 | Temp 98.1°F | Ht 63.0 in | Wt 195.0 lb

## 2013-05-27 DIAGNOSIS — I10 Essential (primary) hypertension: Secondary | ICD-10-CM

## 2013-05-27 DIAGNOSIS — IMO0001 Reserved for inherently not codable concepts without codable children: Secondary | ICD-10-CM

## 2013-05-27 DIAGNOSIS — E785 Hyperlipidemia, unspecified: Secondary | ICD-10-CM

## 2013-05-27 DIAGNOSIS — K219 Gastro-esophageal reflux disease without esophagitis: Secondary | ICD-10-CM

## 2013-05-27 DIAGNOSIS — Z8589 Personal history of malignant neoplasm of other organs and systems: Secondary | ICD-10-CM

## 2013-05-27 MED ORDER — CARVEDILOL 25 MG PO TABS: 25.0000 mg | ORAL_TABLET | Freq: Two times a day (BID) | ORAL | Status: DC

## 2013-05-27 MED ORDER — AMLODIPINE BESYLATE 5 MG PO TABS: 5.0000 mg | ORAL_TABLET | Freq: Every day | ORAL | Status: DC

## 2013-05-27 NOTE — Progress Notes (Addendum)
Subjective:    Patient ID: Patricia Austin, female    DOB: 1945-12-15, 67 y.o.   MRN: [id number]  Hypertension This is a chronic problem. The current episode started more than 1 year ago. The problem is unchanged. The problem is uncontrolled (Blood presuure at home has been running in the 170's systolic.). Pertinent negatives include no blurred vision, chest pain, headaches, neck pain, palpitations, peripheral edema, shortness of breath or sweats. There are no associated agents to hypertension. Risk factors for coronary artery disease include diabetes mellitus, dyslipidemia, family history, obesity, post-menopausal state and sedentary lifestyle. Past treatments include beta blockers, ACE inhibitors and diuretics. The current treatment provides mild improvement. Compliance problems include diet and exercise.   Hyperlipidemia This is a chronic problem. The current episode started more than 1 year ago. The problem is uncontrolled. Recent lipid tests were reviewed and are high. There are no known factors aggravating her hyperlipidemia. Associated symptoms include myalgias. Pertinent negatives include no chest pain or shortness of breath. She is currently on no antihyperlipidemic treatment (patient has never wanted toi take.). Compliance problems include adherence to diet and adherence to exercise.  Risk factors for coronary artery disease include diabetes mellitus, family history, hypertension, obesity, post-menopausal and a sedentary lifestyle.  Diabetes She presents for her follow-up diabetic visit. She has type 2 diabetes mellitus. No MedicAlert identification noted. The initial diagnosis of diabetes was made 15 years ago. Her disease course has been fluctuating. There are no hypoglycemic associated symptoms. Pertinent negatives for hypoglycemia include no headaches or sweats. Pertinent negatives for diabetes include no blurred vision and no chest pain. There are no hypoglycemic complications. Symptoms are  stable. There are no diabetic complications. Risk factors for coronary artery disease include dyslipidemia, family history, hypertension, obesity, post-menopausal and sedentary lifestyle. Current diabetic treatment includes oral agent (dual therapy) (Ran out of byduren and blood sugars got out of control- started back on bydureon 3 weeks ago and blood sugars have really come down.). She is compliant with treatment some of the time. Her weight is stable. When asked about meal planning, she reported none. She has not had a previous visit with a dietician. She rarely participates in exercise. Her home blood glucose trend is fluctuating minimally. Her breakfast blood glucose range is generally 180-200 mg/dl. Her highest blood glucose is >200 mg/dl. Her overall blood glucose range is >200 mg/dl. An ACE inhibitor/angiotensin II receptor blocker is being taken. She does not see a podiatrist.Eye exam is not current (2013).  Fibromyalgia/periperal neuropathy Cymbalta working ok but would like to increase dose because still has achiness all over- she also takes neurotin which helps some.   Review of Systems  Constitutional: Negative.   Eyes: Negative.  Negative for blurred vision.  Respiratory: Negative.  Negative for shortness of breath.   Cardiovascular: Negative for chest pain and palpitations.  Genitourinary: Negative.   Musculoskeletal: Positive for arthralgias, back pain and myalgias. Negative for neck pain.  Neurological: Negative for headaches.       Objective:   Physical Exam  Constitutional: She is oriented to person, place, and time. She appears well-developed and well-nourished.  HENT:  Nose: Nose normal.  Mouth/Throat: Oropharynx is clear and moist.  Eyes: EOM are normal.  Neck: Trachea normal, normal range of motion and full passive range of motion without pain. Neck supple. No JVD present. Carotid bruit is not present. No thyromegaly present.  Cardiovascular: Normal rate, regular rhythm,  normal heart sounds and intact distal pulses.  Exam reveals no  gallop and no friction rub.   No murmur heard. Pulmonary/Chest: Effort normal and breath sounds normal.  Abdominal: Soft. Bowel sounds are normal. She exhibits no distension and no mass. There is no tenderness.  Musculoskeletal: Normal range of motion.  Lymphadenopathy:    She has no cervical adenopathy.  Neurological: She is alert and oriented to person, place, and time. She has normal reflexes.  Skin: Skin is warm and dry.  Scalp scar tender on right- no edema- no discoloration  Psychiatric: She has a normal mood and affect. Her behavior is normal. Judgment and thought content normal.    BP 136/77  Pulse 81  Temp(Src) 98.1 F (36.7 C) (Oral)  Ht 5\' 3"  (1.6 m)  Wt 195 lb (88.451 kg)  BMI 34.55 kg/m2 Results for orders placed in visit on 05/27/13  POCT GLYCOSYLATED HEMOGLOBIN (HGB A1C)      Result Value Range   Hemoglobin A1C 9.7           Assessment & Plan:  1. HTN (hypertension) Low Na+ diet - CMP14+EGFR - coreg 25mg  1 po BID #60 5 refills\-amlodipine 5mg  1 po qd #30 5 refills  2. Type II or unspecified type diabetes mellitus without mention of complication, uncontrolled Count crabs Keep diary of blood sugars Stay on bydureon- if can't afford willl have to go on lantus daily - POCT glycosylated hemoglobin (Hb A1C) - glipiZIDE-metformin (METAGLIP) 2.5-500 MG per tablet; 2 PO BID  Dispense: 120 tablet; Refill: 2 - POCT UA - Microalbumin  3. Morbid obesity Discussed diet  Encouraged exercsie  4. Hyperlipidemia with target LDL less than 100 Low fat diet - NMR, lipoprofile  5. Osteoarthritis of both feet Keep follow up with ortho  6. Osteoarthritis of both knees Rest when can  -7. GERD (gastroesophageal reflux disease) Avoid spicy and fatty foods  8. Allergic rhinitis OTC meds as needed   9. Squamous cell carcinoma of scalp Will schedule scalp CT   Mary-Margaret Daphine Deutscher, FNP

## 2013-05-27 NOTE — Patient Instructions (Signed)

## 2013-05-27 NOTE — Addendum Note (Signed)
Addended by: Bennie Pierini on: 05/27/2013 11:42 AM   Modules accepted: Orders

## 2013-05-29 LAB — CMP14+EGFR
AST: 21 IU/L (ref 0–40)
Albumin: 4 g/dL (ref 3.6–4.8)
Alkaline Phosphatase: 105 IU/L (ref 39–117)
BUN/Creatinine Ratio: 15 (ref 11–26)
Chloride: 100 mmol/L (ref 97–108)
GFR calc Af Amer: 68 mL/min/{1.73_m2} (ref >59)
Potassium: 5.2 mmol/L (ref 3.5–5.2)
Total Bilirubin: 0.2 mg/dL (ref 0.0–1.2)

## 2013-05-29 LAB — NMR, LIPOPROFILE
Cholesterol: 184 mg/dL (ref <200)
LDL Particle Number: 1193 nmol/L — ABNORMAL HIGH (ref <1000)
LDL Size: 21.2 nm (ref >20.5)
LDLC SERPL CALC-MCNC: 100 mg/dL — ABNORMAL HIGH (ref <100)
LP-IR Score: 33 (ref <=45)
Small LDL Particle Number: 479 nmol/L (ref <=527)

## 2013-06-02 ENCOUNTER — Ambulatory Visit
Admission: RE | Admit: 2013-06-02 | Discharge: 2013-06-02 | Disposition: A | Discharge status: Home / Self Care | Payer: Medicare Other | Source: Ambulatory Visit | Attending: Nurse Practitioner | Admitting: Nurse Practitioner

## 2013-06-02 DIAGNOSIS — Z8589 Personal history of malignant neoplasm of other organs and systems: Secondary | ICD-10-CM

## 2013-06-02 MED ORDER — IOHEXOL 300 MG/ML  SOLN: 75.0000 mL | Freq: Once | INTRAMUSCULAR | Status: AC | PRN

## 2013-06-08 ENCOUNTER — Telehealth: Payer: Self-pay | Admitting: *Deleted

## 2013-06-08 NOTE — Telephone Encounter (Signed)
Home health nurse said patient's urine was 4+ glucose.  She  (Patricia Austin), thought you should know.

## 2013-06-09 NOTE — Telephone Encounter (Signed)
What are blood sugars running?

## 2013-06-13 NOTE — Telephone Encounter (Signed)
ok 

## 2013-06-13 NOTE — Telephone Encounter (Signed)
Patient has not been checking it in a few days but the last time she checked it it was below 200 she will call back tom with some reading she said

## 2013-07-11 ENCOUNTER — Telehealth: Payer: Self-pay | Admitting: *Deleted

## 2013-07-11 NOTE — Telephone Encounter (Signed)
Pt notified that we received Bydureon pen inj 2mg  #12 from [employer] patient assistance program. She will have her husband pick up today. This was supposed to be shipped to her house but she said they called and said it would have to be shipped to our office.

## 2013-08-16 ENCOUNTER — Other Ambulatory Visit: Payer: Self-pay | Admitting: Family Medicine

## 2013-08-16 ENCOUNTER — Other Ambulatory Visit: Payer: Self-pay | Admitting: Nurse Practitioner

## 2013-09-14 ENCOUNTER — Other Ambulatory Visit: Payer: Self-pay | Admitting: Nurse Practitioner

## 2013-09-19 ENCOUNTER — Other Ambulatory Visit: Payer: Self-pay | Admitting: Pharmacist

## 2013-09-19 MED ORDER — EXENATIDE ER 2 MG ~~LOC~~ PEN: 2.0000 mg | PEN_INJECTOR | SUBCUTANEOUS | Status: DC

## 2013-09-22 ENCOUNTER — Telehealth: Payer: Self-pay | Admitting: Pharmacist

## 2013-09-22 ENCOUNTER — Other Ambulatory Visit: Payer: Self-pay | Admitting: Pharmacist

## 2013-09-22 MED ORDER — EXENATIDE ER 2 MG ~~LOC~~ PEN: 2.0000 mg | PEN_INJECTOR | SUBCUTANEOUS | Status: DC

## 2013-09-22 NOTE — Telephone Encounter (Signed)
I printed out Rx again and refaxed. Patient notified via VM

## 2013-09-28 ENCOUNTER — Telehealth: Payer: Self-pay | Admitting: Pharmacist

## 2013-10-03 NOTE — Telephone Encounter (Signed)
rx was signed by Dr Laurance Flatten and faxed back to [employer] patient assistance program. [place] program [phone].  Confirmed that they received rx.

## 2013-10-05 ENCOUNTER — Telehealth: Payer: Self-pay | Admitting: *Deleted

## 2013-10-05 NOTE — Telephone Encounter (Signed)
Pt notified that we received her indigent medication of Bydereon pens 2mg  #12 pens

## 2013-10-19 ENCOUNTER — Encounter: Payer: Self-pay | Admitting: Nurse Practitioner

## 2013-10-19 ENCOUNTER — Ambulatory Visit (INDEPENDENT_AMBULATORY_CARE_PROVIDER_SITE_OTHER): Payer: Medicare Other

## 2013-10-19 ENCOUNTER — Ambulatory Visit (INDEPENDENT_AMBULATORY_CARE_PROVIDER_SITE_OTHER): Payer: Medicare Other | Admitting: Nurse Practitioner

## 2013-10-19 VITALS — BP 108/64 | HR 81 | Temp 98.4°F | Ht 63.0 in | Wt 198.0 lb

## 2013-10-19 DIAGNOSIS — I10 Essential (primary) hypertension: Secondary | ICD-10-CM

## 2013-10-19 DIAGNOSIS — E785 Hyperlipidemia, unspecified: Secondary | ICD-10-CM

## 2013-10-19 DIAGNOSIS — E1165 Type 2 diabetes mellitus with hyperglycemia: Secondary | ICD-10-CM

## 2013-10-19 DIAGNOSIS — IMO0001 Reserved for inherently not codable concepts without codable children: Secondary | ICD-10-CM

## 2013-10-19 DIAGNOSIS — K219 Gastro-esophageal reflux disease without esophagitis: Secondary | ICD-10-CM

## 2013-10-19 LAB — POCT GLYCOSYLATED HEMOGLOBIN (HGB A1C)

## 2013-10-19 NOTE — Patient Instructions (Signed)
Premature Beats °A premature beat is an extra heartbeat that happens earlier than normal. Premature beats are called premature atrial contractions (PACs) or premature ventricular contractions (PVCs) depending on the area of the heart where they start. °CAUSES  °Premature beats may be brought on by a variety of factors including: °· Emotional stress. °· Lack of sleep. °· Caffeine. °· Asthma medicines. °· Stimulants. °· Herbal teas. °· Dietary supplements. °· Alcohol. °In most cases, premature beats are not dangerous and are not a sign of serious heart disease. Most patients evaluated for premature beats have completely normal heart function. Rarely, premature beats may be a sign of more significant heart problems or medical illness. °SYMPTOMS  °Premature beats may cause palpitations. This means you feel like your heart is skipping a beat or beating harder than usual. Sometimes, slight chest pain occurs with premature beats, lasting only a few seconds. This pain has been described as a "flopping" feeling inside the chest. In many cases, premature beats do not cause any symptoms and they are only detected when an electrocardiography test (EKG) or heart monitoring is performed. °DIAGNOSIS  °Your caregiver may run some tests to evaluate your heart such as an EKG or echocardiography. You may need to wear a portable heart monitor for several days to record the electrical activity of your heart. Blood testing may also be performed to check your electrolytes and thyroid function. °TREATMENT  °Premature beats usually go away with rest. If the problem continues, your caregiver will determine a treatment plan for you.  °HOME CARE INSTRUCTIONS °· Get plenty of rest over the next few days until your symptoms improve. °· Avoid coffee, tea, alcohol, and soda (pop, cola). °· Do not smoke. °SEEK MEDICAL CARE IF: °· Your symptoms continue after 1 to 2 days of rest. °· You have new symptoms, such as chest pain or trouble  breathing. °SEEK IMMEDIATE MEDICAL CARE IF: °· You have severe chest pain or abdominal pain. °· You have pain that radiates into the neck, arm, or jaw. °· You faint or have extreme weakness. °· You have shortness of breath. °· Your heartbeat races for more than 5 seconds. °MAKE SURE YOU: °· Understand these instructions. °· Will watch your condition. °· Will get help right away if you are not doing well or get worse. °Document Released: 07/17/2004 Document Revised: 09/01/2011 Document Reviewed: 02/10/2011 °Exit[employer]® Patient Information ©2014 [employer]. ° °

## 2013-10-19 NOTE — Progress Notes (Addendum)
Subjective:    Patient ID: Patricia Austin, female    DOB: May 18, 1946, 68 y.o.   MRN: [id number]  Patient here today for follow up of chronic medical problems.  Hypertension This is a chronic problem. The current episode started more than 1 year ago. The problem is unchanged. The problem is uncontrolled (patient doesn't check at home.). Pertinent negatives include no blurred vision, chest pain, headaches, neck pain, palpitations, peripheral edema, shortness of breath or sweats. There are no associated agents to hypertension. Risk factors for coronary artery disease include diabetes mellitus, dyslipidemia, family history, obesity, post-menopausal state and sedentary lifestyle. Past treatments include beta blockers, ACE inhibitors and diuretics. The current treatment provides mild improvement. Compliance problems include diet and exercise.   Hyperlipidemia This is a chronic problem. The current episode started more than 1 year ago. The problem is uncontrolled. Recent lipid tests were reviewed and are high. There are no known factors aggravating her hyperlipidemia. Associated symptoms include myalgias. Pertinent negatives include no chest pain or shortness of breath. She is currently on no antihyperlipidemic treatment (patient has never wanted toi take.). Compliance problems include adherence to diet and adherence to exercise.  Risk factors for coronary artery disease include diabetes mellitus, family history, hypertension, obesity, post-menopausal and a sedentary lifestyle.  Diabetes She presents for her follow-up diabetic visit. She has type 2 diabetes mellitus. No MedicAlert identification noted. The initial diagnosis of diabetes was made 15 years ago. Her disease course has been fluctuating. There are no hypoglycemic associated symptoms. Pertinent negatives for hypoglycemia include no headaches or sweats. Pertinent negatives for diabetes include no blurred vision and no chest pain. There are no hypoglycemic  complications. Symptoms are stable. There are no diabetic complications. Risk factors for coronary artery disease include dyslipidemia, family history, hypertension, obesity, post-menopausal and sedentary lifestyle. Current diabetic treatment includes oral agent (dual therapy) (Back on bydureon and is helping some.). She is compliant with treatment some of the time. Her weight is stable. When asked about meal planning, she reported none. She has not had a previous visit with a dietician. She rarely participates in exercise. Her home blood glucose trend is fluctuating minimally. Her breakfast blood glucose range is generally 130-140 mg/dl. Her highest blood glucose is >200 mg/dl. Her overall blood glucose range is 130-140 mg/dl. An ACE inhibitor/angiotensin II receptor blocker is being taken. She does not see a podiatrist.Eye exam is not current (2013).  Fibromyalgia/periperal neuropathy Cymbalta working ok but would like to increase dose because still has achiness all over- she also takes neurotin which helps some.  * feels like heart is "flip-flopping" occurs off and on everyday.  Review of Systems  Constitutional: Negative.   Eyes: Negative.  Negative for blurred vision.  Respiratory: Negative.  Negative for shortness of breath.   Cardiovascular: Negative for chest pain and palpitations.  Genitourinary: Negative.   Musculoskeletal: Positive for arthralgias, back pain and myalgias. Negative for neck pain.  Neurological: Negative for headaches.       Objective:   Physical Exam  Constitutional: She is oriented to person, place, and time. She appears well-developed and well-nourished.  HENT:  Nose: Nose normal.  Mouth/Throat: Oropharynx is clear and moist.  Eyes: EOM are normal.  Neck: Trachea normal, normal range of motion and full passive range of motion without pain. Neck supple. No JVD present. Carotid bruit is not present. No thyromegaly present.  Cardiovascular: Normal rate, regular  rhythm, normal heart sounds and intact distal pulses.  Exam reveals no gallop and  no friction rub.   No murmur heard. Pulmonary/Chest: Effort normal and breath sounds normal.  Abdominal: Soft. Bowel sounds are normal. She exhibits no distension and no mass. There is no tenderness.  Musculoskeletal: Normal range of motion.  Lymphadenopathy:    She has no cervical adenopathy.  Neurological: She is alert and oriented to person, place, and time. She has normal reflexes.  Skin: Skin is warm and dry.  Scalp scar tender on right- no edema- no discoloration  Psychiatric: She has a normal mood and affect. Her behavior is normal. Judgment and thought content normal.    BP 108/64  Pulse 81  Temp(Src) 98.4 F (36.9 C) (Oral)  Ht $R'5\' 3"'hG$  (1.6 m)  Wt 198 lb (89.812 kg)  BMI 35.08 kg/m2 Results for orders placed in visit on 10/19/13  POCT GLYCOSYLATED HEMOGLOBIN (HGB A1C)      Result Value Ref Range   Hemoglobin A1C 8.0%      EKG- NSR with premature beats-Mary-Margaret Hassell Done, FNP  Chest xray- unchanged from previous-Preliminary reading by Ronnald Collum, FNP  [facility]     Assessment & Plan:   1. Hyperlipidemia with target LDL less than 100   2. Hypertension   3. Type II or unspecified type diabetes mellitus without mention of complication, uncontrolled   4. Morbid obesity   5. GERD (gastroesophageal reflux disease)    Orders Placed This Encounter  Procedures  . CMP14+EGFR  . NMR, lipoprofile  . POCT glycosylated hemoglobin (Hb A1C)   Hgba1c is improving so we will continue current  meds and recheck in 3  months Labs pending Health maintenance reviewed Diet and exercise encouraged Continue all meds Follow up  In 3 months   [place], FNP

## 2013-10-20 LAB — NMR, LIPOPROFILE
CHOLESTEROL: 167 mg/dL (ref <200)
HDL CHOLESTEROL BY NMR: 53 mg/dL (ref >=40)
HDL PARTICLE NUMBER: 29 umol/L — AB (ref >=30.5)
LDL Particle Number: 1052 nmol/L — ABNORMAL HIGH (ref <1000)
LDL Size: 21.4 nm (ref >20.5)
LDLC SERPL CALC-MCNC: 88 mg/dL (ref <100)
LP-IR Score: 32 (ref <=45)
Small LDL Particle Number: 231 nmol/L (ref <=527)
Triglycerides by NMR: 130 mg/dL (ref <150)

## 2013-10-20 LAB — CMP14+EGFR
ALBUMIN: 3.8 g/dL (ref 3.6–4.8)
ALT: 15 IU/L (ref 0–32)
AST: 17 IU/L (ref 0–40)
Albumin/Globulin Ratio: 1.7 (ref 1.1–2.5)
Alkaline Phosphatase: 111 IU/L (ref 39–117)
BUN/Creatinine Ratio: 19 (ref 11–26)
BUN: 23 mg/dL (ref 8–27)
CALCIUM: 9.7 mg/dL (ref 8.7–10.3)
CO2: 23 mmol/L (ref 18–29)
CREATININE: 1.23 mg/dL — AB (ref 0.57–1.00)
Chloride: 100 mmol/L (ref 97–108)
GFR calc Af Amer: 52 mL/min/{1.73_m2} — ABNORMAL LOW (ref >59)
GFR calc non Af Amer: 45 mL/min/{1.73_m2} — ABNORMAL LOW (ref >59)
GLOBULIN, TOTAL: 2.3 g/dL (ref 1.5–4.5)
Glucose: 245 mg/dL — ABNORMAL HIGH (ref 65–99)
Potassium: 5.1 mmol/L (ref 3.5–5.2)
Sodium: 140 mmol/L (ref 134–144)
Total Bilirubin: <0.2 mg/dL (ref 0.0–1.2)
Total Protein: 6.1 g/dL (ref 6.0–8.5)

## 2013-11-02 ENCOUNTER — Other Ambulatory Visit: Payer: Self-pay | Admitting: Nurse Practitioner

## 2013-11-29 ENCOUNTER — Other Ambulatory Visit: Payer: Self-pay | Admitting: Physician Assistant

## 2013-11-29 NOTE — H&P (Signed)
TOTAL KNEE ADMISSION H&P  Patient is being admitted for left total knee arthroplasty.  Subjective:  Chief Complaint:left knee pain.  HPI: Patricia Austin, 68 y.o. female, has a history of pain and functional disability in the left knee due to arthritis and has failed non-surgical conservative treatments for greater than 12 weeks to includeNSAID's and/or analgesics, use of assistive devices and activity modification.  Onset of symptoms was gradual, starting 2 years ago with gradually worsening course since that time. The patient noted no past surgery on the left knee(s).  Patient currently rates pain in the left knee(s) at 5 out of 10 with activity. Patient has night pain, worsening of pain with activity and weight bearing, pain that interferes with activities of daily living, pain with passive range of motion, crepitus and joint swelling.  Patient has evidence of subchondral sclerosis and joint space narrowing by imaging studies. There is no active infection.  Patient Active Problem List   Diagnosis Date Noted  . Type II or unspecified type diabetes mellitus without mention of complication, uncontrolled 02/09/2013  . Morbid obesity 02/09/2013  . Hypertension 02/09/2013  . Hyperlipidemia with target LDL less than 100 02/09/2013  . Osteoarthritis of both feet 02/09/2013  . Osteoarthritis of both knees 02/09/2013  . GERD (gastroesophageal reflux disease) 02/09/2013  . Allergic rhinitis 02/09/2013  . Squamous cell carcinoma of scalp 02/09/2013   Past Medical History  Diagnosis Date  . Diabetes mellitus without complication   . Hypertension   . GERD (gastroesophageal reflux disease)   . Allergy   . squamous cell carcinoma scalp     Past Surgical History  Procedure Laterality Date  . Abdominal hysterectomy    . Spine surgery x2    . Appendectomy    . Scalp surgery       (Not in a hospital admission) No Known Allergies  History  Substance Use Topics  . Smoking status: Never Smoker   .  Smokeless tobacco: Not on file  . Alcohol Use: Yes    Family History  Problem Relation Age of Onset  . Early death Mother   . Thrombosis Father      Review of Systems  Constitutional: Negative.   HENT: Negative.   Eyes: Negative.   Respiratory: Negative.   Cardiovascular: Negative.   Gastrointestinal: Negative.   Genitourinary: Negative.   Musculoskeletal: Positive for joint pain.  Skin: Negative.   Neurological: Negative.   Endo/Heme/Allergies: Negative.   Psychiatric/Behavioral: Negative.     Objective:  Physical Exam  Constitutional: She is oriented to person, place, and time. She appears well-developed and well-nourished.  HENT:  Head: Normocephalic and atraumatic.  Eyes: EOM are normal. Pupils are equal, round, and reactive to light.  Neck: Normal range of motion.  Cardiovascular: Normal rate and regular rhythm.  Exam reveals no gallop and no friction rub.   No murmur heard. Respiratory: Effort normal and breath sounds normal. No respiratory distress. She has no wheezes. She has no rales.  GI: Soft. Bowel sounds are normal. She exhibits no distension. There is no tenderness.  Musculoskeletal:  68 year old female. Markedly antalgic gait varus thrust left greater than right. 5'3" 190 pounds. Negative straight leg raise both sides negative log roll both hips. Left knee has range of motion 0-110. She has 5-7 degrees of varus not very correctable. Tibiofemoral and patellofemoral crepitus. Right knee has range of motion 0-120 degrees. Just a little bit of varus. Patellofemoral greater than tibiofemoral crepitus. Good stability both sides. Reasonable  strength. She is neurovascularly intact distally. Decreased lumbar motion with soreness  lower lumbar spine and over SI joints sciatic notch.  Neurological: She is alert and oriented to person, place, and time.  Skin: Skin is warm and dry.  Psychiatric: She has a normal mood and affect. Her behavior is normal. Judgment and thought  content normal.    Vital signs in last 24 hours: @VSRANGES @  Labs:   Estimated body mass index is 35.08 kg/(m^2) as calculated from the following:   Height as of 10/19/13: 5\' 3"  (1.6 m).   Weight as of 10/19/13: 89.812 kg (198 lb).   Imaging Review Plain radiographs demonstrate severe degenerative joint disease of the left knee(s). The overall alignment ismild varus. The bone quality appears to be fair for age and reported activity level.  Assessment/Plan:  End stage arthritis, left knee   The patient history, physical examination, clinical judgment of the provider and imaging studies are consistent with end stage degenerative joint disease of the left knee(s) and total knee arthroplasty is deemed medically necessary. The treatment options including medical management, injection therapy arthroscopy and arthroplasty were discussed at length. The risks and benefits of total knee arthroplasty were presented and reviewed. The risks due to aseptic loosening, infection, stiffness, patella tracking problems, thromboembolic complications and other imponderables were discussed. The patient acknowledged the explanation, agreed to proceed with the plan and consent was signed. Patient is being admitted for inpatient treatment for surgery, pain control, PT, OT, prophylactic antibiotics, VTE prophylaxis, progressive ambulation and ADL's and discharge planning. The patient is planning to be discharged home with home health services

## 2013-12-01 ENCOUNTER — Encounter (HOSPITAL_COMMUNITY): Payer: Self-pay | Admitting: Pharmacy Technician

## 2013-12-02 ENCOUNTER — Other Ambulatory Visit: Payer: Self-pay | Admitting: Nurse Practitioner

## 2013-12-05 NOTE — Pre-Procedure Instructions (Signed)
Patricia Austin  12/05/2013   Your procedure is scheduled on:  Wednesday December 14, 2013 at 12:30 PM.  Report to [facility] at 10:30a.m.  Call this number if you have problems the morning of surgery: [phone]   Remember:   Do not eat food or drink liquids after midnight. On Tuesday   Take these medicines the morning of surgery with A SIP OF WATER: Amlodipine (Norvasc), Carvedilol (Coreg), Duloxetine (Cymbalta), Gabapentin (Neurontin), and Hydrocodone (Vicodin) if needed for pain.    Do NOT take any diabetic medications the morning of your surgery (ex. Glipizide-Metformin)   Do not wear jewelry, make-up or nail polish.  Do not wear lotions, powders, or perfumes.   Do not shave 48 hours prior to surgery.   Do not bring valuables to the hospital.  [facility] is not responsible for any belongings or valuables.               Contacts, dentures or bridgework may not be worn into surgery.  Leave suitcase in the car. After surgery it may be brought to your room.  For patients admitted to the hospital, discharge time is determined by your treatment team.               Patients discharged the day of surgery will not be allowed to drive home.  Name and phone number of your driver: Family/Friend  Special Instructions: Shower using CHG soap the night before and the morning of your surgery   Please read over the following fact sheets that you were given: Pain Booklet, Coughing and Deep Breathing, Blood Transfusion Information, Total Joint Packet, MRSA Information and Surgical Site Infection Prevention

## 2013-12-06 ENCOUNTER — Encounter (HOSPITAL_COMMUNITY): Payer: Self-pay

## 2013-12-06 ENCOUNTER — Encounter (HOSPITAL_COMMUNITY)
Admission: RE | Admit: 2013-12-06 | Discharge: 2013-12-06 | Disposition: A | Discharge status: Home / Self Care | Payer: Medicare Other | Source: Ambulatory Visit | Attending: Orthopedic Surgery | Admitting: Orthopedic Surgery

## 2013-12-06 DIAGNOSIS — Z01812 Encounter for preprocedural laboratory examination: Secondary | ICD-10-CM | POA: Insufficient documentation

## 2013-12-06 HISTORY — DX: Cardiac arrhythmia, unspecified: I49.9

## 2013-12-06 HISTORY — DX: Adverse effect of unspecified anesthetic, initial encounter: T41.45XA

## 2013-12-06 HISTORY — DX: Shortness of breath: R06.02

## 2013-12-06 HISTORY — DX: Fibromyalgia: M79.7

## 2013-12-06 LAB — CBC WITH DIFFERENTIAL/PLATELET
BASOS ABS: 0 10*3/uL (ref 0.0–0.1)
Basophils Relative: 0 % (ref 0–1)
Eosinophils Absolute: 0.4 10*3/uL (ref 0.0–0.7)
Eosinophils Relative: 4 % (ref 0–5)
HEMATOCRIT: 36.2 % (ref 36.0–46.0)
Hemoglobin: 11.8 g/dL — ABNORMAL LOW (ref 12.0–15.0)
LYMPHS PCT: 21 % (ref 12–46)
Lymphs Abs: 2.1 10*3/uL (ref 0.7–4.0)
MCH: 30.6 pg (ref 26.0–34.0)
MCHC: 32.6 g/dL (ref 30.0–36.0)
MCV: 94 fL (ref 78.0–100.0)
Monocytes Absolute: 0.6 10*3/uL (ref 0.1–1.0)
Monocytes Relative: 6 % (ref 3–12)
NEUTROS ABS: 7 10*3/uL (ref 1.7–7.7)
Neutrophils Relative %: 69 % (ref 43–77)
Platelets: 244 10*3/uL (ref 150–400)
RBC: 3.85 MIL/uL — ABNORMAL LOW (ref 3.87–5.11)
RDW: 13.6 % (ref 11.5–15.5)
WBC: 10.1 10*3/uL (ref 4.0–10.5)

## 2013-12-06 LAB — URINALYSIS, ROUTINE W REFLEX MICROSCOPIC
GLUCOSE, UA: NEGATIVE mg/dL
Hgb urine dipstick: NEGATIVE
Ketones, ur: 15 mg/dL — AB
Leukocytes, UA: NEGATIVE
Nitrite: NEGATIVE
PH: 5.5 (ref 5.0–8.0)
Protein, ur: NEGATIVE mg/dL
Specific Gravity, Urine: 1.025 (ref 1.005–1.030)
Urobilinogen, UA: 1 mg/dL (ref 0.0–1.0)

## 2013-12-06 LAB — APTT: APTT: 34 s (ref 24–37)

## 2013-12-06 LAB — COMPREHENSIVE METABOLIC PANEL
ALK PHOS: 104 U/L (ref 39–117)
ALT: 18 U/L (ref 0–35)
AST: 22 U/L (ref 0–37)
Albumin: 3.8 g/dL (ref 3.5–5.2)
BILIRUBIN TOTAL: 0.3 mg/dL (ref 0.3–1.2)
BUN: 22 mg/dL (ref 6–23)
CHLORIDE: 102 meq/L (ref 96–112)
CO2: 28 mEq/L (ref 19–32)
Calcium: 10.4 mg/dL (ref 8.4–10.5)
Creatinine, Ser: 1.05 mg/dL (ref 0.50–1.10)
GFR calc Af Amer: 61 mL/min — ABNORMAL LOW (ref >90)
GFR, EST NON AFRICAN AMERICAN: 53 mL/min — AB (ref >90)
Glucose, Bld: 152 mg/dL — ABNORMAL HIGH (ref 70–99)
Potassium: 5 mEq/L (ref 3.7–5.3)
SODIUM: 141 meq/L (ref 137–147)
Total Protein: 7.3 g/dL (ref 6.0–8.3)

## 2013-12-06 LAB — TYPE AND SCREEN
ABO/RH(D): A POS
Antibody Screen: NEGATIVE

## 2013-12-06 LAB — PROTIME-INR
INR: 0.9 (ref 0.00–1.49)
Prothrombin Time: 12 seconds (ref 11.6–15.2)

## 2013-12-06 LAB — SURGICAL PCR SCREEN
MRSA, PCR: NEGATIVE
STAPHYLOCOCCUS AUREUS: NEGATIVE

## 2013-12-06 NOTE — Progress Notes (Signed)
Call to Dr. Linna Caprice, asked that her history be reviewed due to her report of anesth. challenge in the past.

## 2013-12-07 LAB — URINE CULTURE
Colony Count: NO GROWTH
Culture: NO GROWTH

## 2013-12-12 ENCOUNTER — Ambulatory Visit (INDEPENDENT_AMBULATORY_CARE_PROVIDER_SITE_OTHER): Payer: Medicare Other | Admitting: Nurse Practitioner

## 2013-12-12 ENCOUNTER — Ambulatory Visit (INDEPENDENT_AMBULATORY_CARE_PROVIDER_SITE_OTHER): Payer: Medicare Other

## 2013-12-12 ENCOUNTER — Encounter: Payer: Self-pay | Admitting: Nurse Practitioner

## 2013-12-12 VITALS — BP 114/64 | HR 83 | Temp 98.5°F | Ht 62.0 in | Wt 195.8 lb

## 2013-12-12 DIAGNOSIS — I158 Other secondary hypertension: Secondary | ICD-10-CM

## 2013-12-12 DIAGNOSIS — K219 Gastro-esophageal reflux disease without esophagitis: Secondary | ICD-10-CM

## 2013-12-12 DIAGNOSIS — I159 Secondary hypertension, unspecified: Secondary | ICD-10-CM

## 2013-12-12 DIAGNOSIS — E1165 Type 2 diabetes mellitus with hyperglycemia: Secondary | ICD-10-CM

## 2013-12-12 DIAGNOSIS — IMO0001 Reserved for inherently not codable concepts without codable children: Secondary | ICD-10-CM

## 2013-12-12 DIAGNOSIS — Z01818 Encounter for other preprocedural examination: Secondary | ICD-10-CM

## 2013-12-12 DIAGNOSIS — E785 Hyperlipidemia, unspecified: Secondary | ICD-10-CM

## 2013-12-12 DIAGNOSIS — R635 Abnormal weight gain: Secondary | ICD-10-CM

## 2013-12-12 LAB — POCT GLYCOSYLATED HEMOGLOBIN (HGB A1C): HEMOGLOBIN A1C: 7.2

## 2013-12-12 NOTE — Patient Instructions (Signed)

## 2013-12-12 NOTE — Progress Notes (Signed)
Subjective:    Patient ID: Patricia Austin, female    DOB: 04/30/1946, 67 y.o.   MRN: [id number]  Patient here today for folllow up of chronic medical problems as well as surgical clearance- SHe is scheduled for Total knee replacement.  Hypertension This is a chronic problem. The current episode started more than 1 year ago. The problem is unchanged. The problem is uncontrolled (patient doesn't check at home.). Pertinent negatives include no blurred vision, chest pain, headaches, neck pain, palpitations, peripheral edema, shortness of breath or sweats. There are no associated agents to hypertension. Risk factors for coronary artery disease include diabetes mellitus, dyslipidemia, family history, obesity, post-menopausal state and sedentary lifestyle. Past treatments include beta blockers, ACE inhibitors and diuretics. The current treatment provides mild improvement. Compliance problems include diet and exercise.   Hyperlipidemia This is a chronic problem. The current episode started more than 1 year ago. The problem is uncontrolled. Recent lipid tests were reviewed and are high. There are no known factors aggravating her hyperlipidemia. Associated symptoms include myalgias. Pertinent negatives include no chest pain or shortness of breath. She is currently on no antihyperlipidemic treatment (patient has never wanted toi take.). Compliance problems include adherence to diet and adherence to exercise.  Risk factors for coronary artery disease include diabetes mellitus, family history, hypertension, obesity, post-menopausal and a sedentary lifestyle.  Diabetes She presents for her follow-up diabetic visit. She has type 2 diabetes mellitus. No MedicAlert identification noted. The initial diagnosis of diabetes was made 15 years ago. Her disease course has been fluctuating. There are no hypoglycemic associated symptoms. Pertinent negatives for hypoglycemia include no headaches or sweats. Pertinent negatives for  diabetes include no blurred vision and no chest pain. There are no hypoglycemic complications. Symptoms are stable. There are no diabetic complications. Risk factors for coronary artery disease include dyslipidemia, family history, hypertension, obesity, post-menopausal and sedentary lifestyle. Current diabetic treatment includes oral agent (dual therapy) (was on bydureon but has stopped due to expense.). She is compliant with treatment some of the time. Her weight is stable. When asked about meal planning, she reported none. She has not had a previous visit with a dietician. She rarely participates in exercise. Her home blood glucose trend is fluctuating minimally. Her breakfast blood glucose range is generally 130-140 mg/dl. Her highest blood glucose is >200 mg/dl. Her overall blood glucose range is 130-140 mg/dl. An ACE inhibitor/angiotensin II receptor blocker is being taken. She does not see a podiatrist.Eye exam is not current (2013).  Fibromyalgia/periperal neuropathy Cymbalta working ok but would like to increase dose because still has achiness all over- she also takes neurotin which helps some.   Review of Systems  Constitutional: Negative.   Eyes: Negative.  Negative for blurred vision.  Respiratory: Negative.  Negative for shortness of breath.   Cardiovascular: Negative for chest pain and palpitations.  Genitourinary: Negative.   Musculoskeletal: Positive for arthralgias, back pain and myalgias. Negative for neck pain.  Neurological: Negative for headaches.       Objective:   Physical Exam  Constitutional: She is oriented to person, place, and time. She appears well-developed and well-nourished.  HENT:  Nose: Nose normal.  Mouth/Throat: Oropharynx is clear and moist.  Eyes: EOM are normal.  Neck: Trachea normal, normal range of motion and full passive range of motion without pain. Neck supple. No JVD present. Carotid bruit is not present. No thyromegaly present.  Cardiovascular:  Normal rate, regular rhythm, normal heart sounds and intact distal pulses.  Exam reveals  no gallop and no friction rub.   No murmur heard. Pulmonary/Chest: Effort normal and breath sounds normal.  Abdominal: Soft. Bowel sounds are normal. She exhibits no distension and no mass. There is no tenderness.  Musculoskeletal: Normal range of motion.  Lymphadenopathy:    She has no cervical adenopathy.  Neurological: She is alert and oriented to person, place, and time. She has normal reflexes.  Skin: Skin is warm and dry.  Scalp scar tender on right- no edema- no discoloration  Psychiatric: She has a normal mood and affect. Her behavior is normal. Judgment and thought content normal.    Results for orders placed in visit on 12/12/13  POCT GLYCOSYLATED HEMOGLOBIN (HGB A1C)      Result Value Ref Range   Hemoglobin A1C 7.2     Chest x ray- unchanged from previous-Preliminary reading by Ronnald Collum, FNP  [facility]   EKG- NSR- unchanged from previous-Mary-Margaret Hassell Done, FNP   BP 114/64  Pulse 83  Temp(Src) 98.5 F (36.9 C) (Oral)  Ht $R'5\' 2"'zQ$  (1.575 m)  Wt 195 lb 12.8 oz (88.814 kg)  BMI 35.80 kg/m2       Assessment & Plan:   1. Preoperative clearance   2. Type II or unspecified type diabetes mellitus without mention of complication, uncontrolled   3. Secondary hypertension, unspecified   4. Hyperlipidemia with target LDL less than 100   5. Pre-op exam   6. Morbid obesity   7. Gastroesophageal reflux disease without esophagitis    Orders Placed This Encounter  Procedures  . DG Chest 2 View    Standing Status: Future     Number of Occurrences: 1     Standing Expiration Date: 02/11/2015    Order Specific Question:  Reason for Exam (SYMPTOM  OR DIAGNOSIS REQUIRED)    Answer:  pre op    Order Specific Question:  Preferred imaging location?    Answer:  Internal  . CMP14+EGFR  . NMR, lipoprofile  . POCT glycosylated hemoglobin (Hb A1C)  . EKG 12-Lead   Keep appointment for Total  knee replacement- clearance sent to Dr. Debroah Loop office Labs pending Health maintenance reviewed Diet and exercise encouraged Continue all meds Follow up  In 3 months   [place], FNP

## 2013-12-13 LAB — NMR, LIPOPROFILE
CHOLESTEROL: 166 mg/dL (ref 100–199)
HDL CHOLESTEROL BY NMR: 50 mg/dL (ref >39)
HDL PARTICLE NUMBER: 26.9 umol/L — AB (ref >=30.5)
LDL Particle Number: 939 nmol/L (ref <1000)
LDL Size: 21.4 nm (ref >20.5)
LDLC SERPL CALC-MCNC: 93 mg/dL (ref 0–99)
LP-IR Score: 37 (ref <=45)
SMALL LDL PARTICLE NUMBER: 255 nmol/L (ref <=527)
Triglycerides by NMR: 115 mg/dL (ref 0–149)

## 2013-12-13 LAB — CMP14+EGFR
A/G RATIO: 1.8 (ref 1.1–2.5)
ALBUMIN: 4.2 g/dL (ref 3.6–4.8)
ALT: 15 IU/L (ref 0–32)
AST: 20 IU/L (ref 0–40)
Alkaline Phosphatase: 110 IU/L (ref 39–117)
BUN/Creatinine Ratio: 19 (ref 11–26)
BUN: 21 mg/dL (ref 8–27)
CALCIUM: 9.7 mg/dL (ref 8.7–10.3)
CO2: 24 mmol/L (ref 18–29)
CREATININE: 1.09 mg/dL — AB (ref 0.57–1.00)
Chloride: 98 mmol/L (ref 97–108)
GFR calc Af Amer: 60 mL/min/{1.73_m2} (ref >59)
GFR calc non Af Amer: 52 mL/min/{1.73_m2} — ABNORMAL LOW (ref >59)
GLOBULIN, TOTAL: 2.4 g/dL (ref 1.5–4.5)
Glucose: 120 mg/dL — ABNORMAL HIGH (ref 65–99)
Potassium: 5.7 mmol/L — ABNORMAL HIGH (ref 3.5–5.2)
SODIUM: 140 mmol/L (ref 134–144)
Total Bilirubin: 0.3 mg/dL (ref 0.0–1.2)
Total Protein: 6.6 g/dL (ref 6.0–8.5)

## 2013-12-13 MED ORDER — CEFAZOLIN SODIUM-DEXTROSE 2-3 GM-% IV SOLR
2.0000 g | INTRAVENOUS | Status: AC
  Administered 2013-12-14: 2 g via INTRAVENOUS
  Filled 2013-12-13: qty 50

## 2013-12-14 ENCOUNTER — Inpatient Hospital Stay (HOSPITAL_COMMUNITY): Payer: Medicare Other | Admitting: Anesthesiology

## 2013-12-14 ENCOUNTER — Inpatient Hospital Stay (HOSPITAL_COMMUNITY): Payer: Medicare Other

## 2013-12-14 ENCOUNTER — Inpatient Hospital Stay (HOSPITAL_COMMUNITY)
Admission: RE | Admit: 2013-12-14 | Discharge: 2013-12-15 | DRG: 470 | Disposition: A | Discharge status: Home Health | Payer: Medicare Other | Source: Ambulatory Visit | Attending: Orthopedic Surgery | Admitting: Orthopedic Surgery

## 2013-12-14 ENCOUNTER — Encounter (HOSPITAL_COMMUNITY): Payer: Medicare Other | Admitting: Anesthesiology

## 2013-12-14 ENCOUNTER — Encounter (HOSPITAL_COMMUNITY)
Admission: RE | Disposition: A | Discharge status: Home Health | Payer: Self-pay | Source: Ambulatory Visit | Attending: Orthopedic Surgery

## 2013-12-14 ENCOUNTER — Encounter (HOSPITAL_COMMUNITY): Payer: Self-pay | Admitting: *Deleted

## 2013-12-14 DIAGNOSIS — M179 Osteoarthritis of knee, unspecified: Secondary | ICD-10-CM | POA: Diagnosis present

## 2013-12-14 DIAGNOSIS — I10 Essential (primary) hypertension: Secondary | ICD-10-CM | POA: Diagnosis present

## 2013-12-14 DIAGNOSIS — E785 Hyperlipidemia, unspecified: Secondary | ICD-10-CM | POA: Diagnosis present

## 2013-12-14 DIAGNOSIS — Z6835 Body mass index (BMI) 35.0-35.9, adult: Secondary | ICD-10-CM

## 2013-12-14 DIAGNOSIS — M24569 Contracture, unspecified knee: Secondary | ICD-10-CM | POA: Diagnosis present

## 2013-12-14 DIAGNOSIS — E119 Type 2 diabetes mellitus without complications: Secondary | ICD-10-CM | POA: Diagnosis present

## 2013-12-14 DIAGNOSIS — M1712 Unilateral primary osteoarthritis, left knee: Secondary | ICD-10-CM

## 2013-12-14 DIAGNOSIS — M1710 Unilateral primary osteoarthritis, unspecified knee: Principal | ICD-10-CM | POA: Diagnosis present

## 2013-12-14 DIAGNOSIS — Z85828 Personal history of other malignant neoplasm of skin: Secondary | ICD-10-CM

## 2013-12-14 HISTORY — DX: Low back pain: M54.5

## 2013-12-14 HISTORY — DX: Other chronic pain: G89.29

## 2013-12-14 HISTORY — DX: Dyskinesia of esophagus: K22.4

## 2013-12-14 HISTORY — DX: Low back pain, unspecified: M54.50

## 2013-12-14 HISTORY — DX: Type 2 diabetes mellitus without complications: E11.9

## 2013-12-14 HISTORY — DX: Unspecified nephritic syndrome with unspecified morphologic changes: N05.9

## 2013-12-14 HISTORY — DX: Depression, unspecified: F32.A

## 2013-12-14 HISTORY — DX: Unspecified osteoarthritis, unspecified site: M19.90

## 2013-12-14 HISTORY — DX: Anxiety disorder, unspecified: F41.9

## 2013-12-14 HISTORY — DX: Major depressive disorder, single episode, unspecified: F32.9

## 2013-12-14 HISTORY — PX: TOTAL KNEE ARTHROPLASTY: SHX125

## 2013-12-14 HISTORY — DX: Other seasonal allergic rhinitis: J30.2

## 2013-12-14 LAB — GLUCOSE, CAPILLARY
GLUCOSE-CAPILLARY: 155 mg/dL — AB (ref 70–99)
GLUCOSE-CAPILLARY: 195 mg/dL — AB (ref 70–99)
GLUCOSE-CAPILLARY: 353 mg/dL — AB (ref 70–99)
GLUCOSE-CAPILLARY: 248 mg/dL — AB (ref 70–99)

## 2013-12-14 SURGERY — ARTHROPLASTY, KNEE, TOTAL
Anesthesia: General | Site: Knee | Laterality: Left

## 2013-12-14 MED ORDER — PHENOL 1.4 % MT LIQD: 1.0000 | OROMUCOSAL | Status: DC | PRN

## 2013-12-14 MED ORDER — SODIUM CHLORIDE 0.9 % IJ SOLN
INTRAMUSCULAR | Status: DC | PRN
  Administered 2013-12-14: 40 mL

## 2013-12-14 MED ORDER — BISACODYL 5 MG PO TBEC: 5.0000 mg | DELAYED_RELEASE_TABLET | Freq: Every day | ORAL | Status: DC | PRN

## 2013-12-14 MED ORDER — INSULIN ASPART 100 UNIT/ML ~~LOC~~ SOLN
0.0000 [IU] | Freq: Every day | SUBCUTANEOUS | Status: DC
  Administered 2013-12-14: 2 [IU] via SUBCUTANEOUS

## 2013-12-14 MED ORDER — SUCCINYLCHOLINE CHLORIDE 20 MG/ML IJ SOLN
INTRAMUSCULAR | Status: DC | PRN
  Administered 2013-12-14: 100 mg via INTRAVENOUS

## 2013-12-14 MED ORDER — OXYCODONE-ACETAMINOPHEN 5-325 MG PO TABS: 1.0000 | ORAL_TABLET | ORAL | Status: DC | PRN

## 2013-12-14 MED ORDER — FENTANYL CITRATE 0.05 MG/ML IJ SOLN
INTRAMUSCULAR | Status: DC | PRN
  Administered 2013-12-14 (×2): 100 ug via INTRAVENOUS

## 2013-12-14 MED ORDER — POTASSIUM CHLORIDE IN NACL 20-0.9 MEQ/L-% IV SOLN
INTRAVENOUS | Status: DC
  Administered 2013-12-14: 17:00:00 via INTRAVENOUS
  Filled 2013-12-14 (×5): qty 1000

## 2013-12-14 MED ORDER — ACETAMINOPHEN 650 MG RE SUPP: 650.0000 mg | Freq: Four times a day (QID) | RECTAL | Status: DC | PRN

## 2013-12-14 MED ORDER — AMLODIPINE BESYLATE 5 MG PO TABS
5.0000 mg | ORAL_TABLET | Freq: Every day | ORAL | Status: DC
  Administered 2013-12-15: 5 mg via ORAL
  Filled 2013-12-14 (×2): qty 1

## 2013-12-14 MED ORDER — MENTHOL 3 MG MT LOZG: 1.0000 | LOZENGE | OROMUCOSAL | Status: DC | PRN

## 2013-12-14 MED ORDER — CHLORHEXIDINE GLUCONATE 4 % EX LIQD: 60.0000 mL | Freq: Once | CUTANEOUS | Status: DC

## 2013-12-14 MED ORDER — ACETAMINOPHEN 325 MG PO TABS: 650.0000 mg | ORAL_TABLET | Freq: Four times a day (QID) | ORAL | Status: DC | PRN

## 2013-12-14 MED ORDER — ONDANSETRON HCL 4 MG/2ML IJ SOLN: 4.0000 mg | Freq: Four times a day (QID) | INTRAMUSCULAR | Status: DC | PRN

## 2013-12-14 MED ORDER — BENAZEPRIL HCL 40 MG PO TABS
40.0000 mg | ORAL_TABLET | Freq: Every day | ORAL | Status: DC
  Administered 2013-12-15: 40 mg via ORAL
  Filled 2013-12-14 (×2): qty 1

## 2013-12-14 MED ORDER — BUPIVACAINE HCL (PF) 0.25 % IJ SOLN
INTRAMUSCULAR | Status: DC | PRN
  Administered 2013-12-14: 10 mL

## 2013-12-14 MED ORDER — METHOCARBAMOL 500 MG PO TABS: 500.0000 mg | ORAL_TABLET | Freq: Four times a day (QID) | ORAL | Status: DC

## 2013-12-14 MED ORDER — ONDANSETRON HCL 4 MG PO TABS: 4.0000 mg | ORAL_TABLET | Freq: Three times a day (TID) | ORAL | Status: DC | PRN

## 2013-12-14 MED ORDER — HYDROMORPHONE HCL PF 1 MG/ML IJ SOLN
INTRAMUSCULAR | Status: AC
  Filled 2013-12-14: qty 1

## 2013-12-14 MED ORDER — METHOCARBAMOL 1000 MG/10ML IJ SOLN
500.0000 mg | Freq: Four times a day (QID) | INTRAVENOUS | Status: DC | PRN
  Filled 2013-12-14: qty 5

## 2013-12-14 MED ORDER — ONDANSETRON HCL 4 MG/2ML IJ SOLN: 4.0000 mg | Freq: Once | INTRAMUSCULAR | Status: DC | PRN

## 2013-12-14 MED ORDER — ONDANSETRON HCL 4 MG/2ML IJ SOLN
INTRAMUSCULAR | Status: AC
  Filled 2013-12-14: qty 2

## 2013-12-14 MED ORDER — PROPOFOL 10 MG/ML IV BOLUS
INTRAVENOUS | Status: AC
  Filled 2013-12-14: qty 20

## 2013-12-14 MED ORDER — GLIPIZIDE 2.5 MG HALF TABLET
2.5000 mg | ORAL_TABLET | Freq: Every day | ORAL | Status: DC
  Filled 2013-12-14: qty 1

## 2013-12-14 MED ORDER — METOCLOPRAMIDE HCL 10 MG PO TABS: 5.0000 mg | ORAL_TABLET | Freq: Three times a day (TID) | ORAL | Status: DC | PRN

## 2013-12-14 MED ORDER — LIDOCAINE HCL (CARDIAC) 20 MG/ML IV SOLN
INTRAVENOUS | Status: DC | PRN
  Administered 2013-12-14: 40 mg via INTRAVENOUS

## 2013-12-14 MED ORDER — ROCURONIUM BROMIDE 50 MG/5ML IV SOLN
INTRAVENOUS | Status: AC
  Filled 2013-12-14: qty 1

## 2013-12-14 MED ORDER — FUROSEMIDE 20 MG PO TABS
20.0000 mg | ORAL_TABLET | Freq: Every day | ORAL | Status: DC
  Administered 2013-12-15: 20 mg via ORAL
  Filled 2013-12-14 (×2): qty 1

## 2013-12-14 MED ORDER — CEFAZOLIN SODIUM-DEXTROSE 2-3 GM-% IV SOLR
2.0000 g | Freq: Four times a day (QID) | INTRAVENOUS | Status: AC
  Administered 2013-12-14 (×2): 2 g via INTRAVENOUS
  Filled 2013-12-14 (×2): qty 50

## 2013-12-14 MED ORDER — HYDROMORPHONE HCL PF 1 MG/ML IJ SOLN
0.5000 mg | INTRAMUSCULAR | Status: DC | PRN
  Administered 2013-12-14: 1 mg via INTRAVENOUS
  Filled 2013-12-14: qty 1

## 2013-12-14 MED ORDER — DEXAMETHASONE SODIUM PHOSPHATE 4 MG/ML IJ SOLN
INTRAMUSCULAR | Status: DC | PRN
  Administered 2013-12-14: 4 mg via INTRAVENOUS

## 2013-12-14 MED ORDER — ONDANSETRON HCL 4 MG PO TABS: 4.0000 mg | ORAL_TABLET | Freq: Four times a day (QID) | ORAL | Status: DC | PRN

## 2013-12-14 MED ORDER — PHENYLEPHRINE 40 MCG/ML (10ML) SYRINGE FOR IV PUSH (FOR BLOOD PRESSURE SUPPORT)
PREFILLED_SYRINGE | INTRAVENOUS | Status: AC
  Filled 2013-12-14: qty 10

## 2013-12-14 MED ORDER — METHOCARBAMOL 500 MG PO TABS
500.0000 mg | ORAL_TABLET | Freq: Four times a day (QID) | ORAL | Status: DC | PRN
  Administered 2013-12-14 – 2013-12-15 (×2): 500 mg via ORAL
  Filled 2013-12-14 (×2): qty 1

## 2013-12-14 MED ORDER — CELECOXIB 200 MG PO CAPS
200.0000 mg | ORAL_CAPSULE | Freq: Two times a day (BID) | ORAL | Status: DC
  Administered 2013-12-15: 200 mg via ORAL
  Filled 2013-12-14 (×5): qty 1

## 2013-12-14 MED ORDER — GLIPIZIDE-METFORMIN HCL 2.5-500 MG PO TABS
1.0000 | ORAL_TABLET | Freq: Two times a day (BID) | ORAL | Status: DC
Start: 2013-12-14 — End: 2013-12-14

## 2013-12-14 MED ORDER — DIPHENHYDRAMINE HCL 12.5 MG/5ML PO ELIX: 12.5000 mg | ORAL_SOLUTION | ORAL | Status: DC | PRN

## 2013-12-14 MED ORDER — BUPIVACAINE LIPOSOME 1.3 % IJ SUSP
20.0000 mL | Freq: Once | INTRAMUSCULAR | Status: DC
  Filled 2013-12-14: qty 20

## 2013-12-14 MED ORDER — DEXAMETHASONE SODIUM PHOSPHATE 4 MG/ML IJ SOLN
INTRAMUSCULAR | Status: AC
Start: 2013-12-14 — End: 2013-12-14
  Filled 2013-12-14: qty 1

## 2013-12-14 MED ORDER — DULOXETINE HCL 60 MG PO CPEP
60.0000 mg | ORAL_CAPSULE | Freq: Every day | ORAL | Status: DC
  Administered 2013-12-15: 60 mg via ORAL
  Filled 2013-12-14 (×2): qty 1

## 2013-12-14 MED ORDER — SODIUM CHLORIDE 0.9 % IR SOLN
Status: DC | PRN
  Administered 2013-12-14: 1000 mL
  Administered 2013-12-14: 3000 mL

## 2013-12-14 MED ORDER — ASPIRIN EC 325 MG PO TBEC: 325.0000 mg | DELAYED_RELEASE_TABLET | Freq: Every day | ORAL | Status: DC

## 2013-12-14 MED ORDER — LIDOCAINE HCL (CARDIAC) 20 MG/ML IV SOLN
INTRAVENOUS | Status: AC
  Filled 2013-12-14: qty 5

## 2013-12-14 MED ORDER — ASPIRIN EC 325 MG PO TBEC
325.0000 mg | DELAYED_RELEASE_TABLET | Freq: Every day | ORAL | Status: DC
  Administered 2013-12-15: 325 mg via ORAL
  Filled 2013-12-14 (×2): qty 1

## 2013-12-14 MED ORDER — PROPOFOL 10 MG/ML IV BOLUS
INTRAVENOUS | Status: DC | PRN
  Administered 2013-12-14: 200 mg via INTRAVENOUS

## 2013-12-14 MED ORDER — METOCLOPRAMIDE HCL 5 MG/ML IJ SOLN: 5.0000 mg | Freq: Three times a day (TID) | INTRAMUSCULAR | Status: DC | PRN

## 2013-12-14 MED ORDER — PHENYLEPHRINE HCL 10 MG/ML IJ SOLN
INTRAMUSCULAR | Status: DC | PRN
  Administered 2013-12-14 (×2): 80 ug via INTRAVENOUS
  Administered 2013-12-14: 120 ug via INTRAVENOUS

## 2013-12-14 MED ORDER — OXYCODONE HCL 5 MG PO TABS
5.0000 mg | ORAL_TABLET | ORAL | Status: DC | PRN
  Administered 2013-12-14 – 2013-12-15 (×3): 10 mg via ORAL
  Filled 2013-12-14 (×3): qty 2

## 2013-12-14 MED ORDER — CARVEDILOL 25 MG PO TABS
25.0000 mg | ORAL_TABLET | Freq: Two times a day (BID) | ORAL | Status: DC
  Administered 2013-12-14 – 2013-12-15 (×2): 25 mg via ORAL
  Filled 2013-12-14 (×4): qty 1

## 2013-12-14 MED ORDER — GABAPENTIN 300 MG PO CAPS
300.0000 mg | ORAL_CAPSULE | Freq: Two times a day (BID) | ORAL | Status: DC
  Administered 2013-12-14 – 2013-12-15 (×2): 300 mg via ORAL
  Filled 2013-12-14 (×5): qty 1

## 2013-12-14 MED ORDER — LIDOCAINE HCL 4 % MT SOLN
OROMUCOSAL | Status: DC | PRN
  Administered 2013-12-14: 4 mL via TOPICAL

## 2013-12-14 MED ORDER — HYDROMORPHONE HCL PF 1 MG/ML IJ SOLN
0.2500 mg | INTRAMUSCULAR | Status: DC | PRN
  Administered 2013-12-14 (×4): 0.25 mg via INTRAVENOUS

## 2013-12-14 MED ORDER — GLIPIZIDE 2.5 MG HALF TABLET
2.5000 mg | ORAL_TABLET | Freq: Two times a day (BID) | ORAL | Status: DC
  Administered 2013-12-14 – 2013-12-15 (×2): 2.5 mg via ORAL
  Filled 2013-12-14 (×4): qty 1

## 2013-12-14 MED ORDER — LACTATED RINGERS IV SOLN
INTRAVENOUS | Status: DC
  Administered 2013-12-14: 10:00:00 via INTRAVENOUS

## 2013-12-14 MED ORDER — FENTANYL CITRATE 0.05 MG/ML IJ SOLN
INTRAMUSCULAR | Status: AC
  Filled 2013-12-14: qty 5

## 2013-12-14 MED ORDER — METFORMIN HCL 500 MG PO TABS
500.0000 mg | ORAL_TABLET | Freq: Two times a day (BID) | ORAL | Status: DC
  Administered 2013-12-14 – 2013-12-15 (×2): 500 mg via ORAL
  Filled 2013-12-14 (×4): qty 1

## 2013-12-14 MED ORDER — ONDANSETRON HCL 4 MG/2ML IJ SOLN
INTRAMUSCULAR | Status: DC | PRN
  Administered 2013-12-14: 4 mg via INTRAVENOUS

## 2013-12-14 MED ORDER — BUPIVACAINE LIPOSOME 1.3 % IJ SUSP
INTRAMUSCULAR | Status: DC | PRN
  Administered 2013-12-14: 20 mL

## 2013-12-14 MED ORDER — INSULIN ASPART 100 UNIT/ML ~~LOC~~ SOLN
0.0000 [IU] | Freq: Three times a day (TID) | SUBCUTANEOUS | Status: DC
  Administered 2013-12-14: 9 [IU] via SUBCUTANEOUS
  Administered 2013-12-15: 2 [IU] via SUBCUTANEOUS
  Administered 2013-12-15: 5 [IU] via SUBCUTANEOUS

## 2013-12-14 MED ORDER — DOCUSATE SODIUM 100 MG PO CAPS
100.0000 mg | ORAL_CAPSULE | Freq: Two times a day (BID) | ORAL | Status: DC
  Administered 2013-12-14 – 2013-12-15 (×2): 100 mg via ORAL
  Filled 2013-12-14 (×2): qty 1

## 2013-12-14 SURGICAL SUPPLY — 69 items
APL SKNCLS STERI-STRIP NONHPOA (GAUZE/BANDAGES/DRESSINGS) ×1
BANDAGE ELASTIC 4 VELCRO ST LF (GAUZE/BANDAGES/DRESSINGS) ×3 IMPLANT
BANDAGE ELASTIC 6 VELCRO ST LF (GAUZE/BANDAGES/DRESSINGS) ×3 IMPLANT
BANDAGE ESMARK 6X9 LF (GAUZE/BANDAGES/DRESSINGS) ×1 IMPLANT
BENZOIN TINCTURE PRP APPL 2/3 (GAUZE/BANDAGES/DRESSINGS) ×3 IMPLANT
BLADE SAG 18X100X1.27 (BLADE) ×6 IMPLANT
BNDG CMPR 9X6 STRL LF SNTH (GAUZE/BANDAGES/DRESSINGS) ×1
BNDG ESMARK 6X9 LF (GAUZE/BANDAGES/DRESSINGS) ×3
BOWL SMART MIX CTS (DISPOSABLE) ×3 IMPLANT
CEMENT BONE SIMPLEX SPEEDSET (Cement) ×6 IMPLANT
CLOSURE STERI-STRIP 1/2X4 (GAUZE/BANDAGES/DRESSINGS) ×1
CLOSURE WOUND 1/2 X4 (GAUZE/BANDAGES/DRESSINGS) ×2
CLSR STERI-STRIP ANTIMIC 1/2X4 (GAUZE/BANDAGES/DRESSINGS) ×1 IMPLANT
COVER SURGICAL LIGHT HANDLE (MISCELLANEOUS) ×3 IMPLANT
CUFF TOURNIQUET SINGLE 34IN LL (TOURNIQUET CUFF) ×3 IMPLANT
DRAPE EXTREMITY T 121X128X90 (DRAPE) ×3 IMPLANT
DRAPE PROXIMA HALF (DRAPES) ×3 IMPLANT
DRAPE U-SHAPE 47X51 STRL (DRAPES) ×3 IMPLANT
DRSG PAD ABDOMINAL 8X10 ST (GAUZE/BANDAGES/DRESSINGS) ×3 IMPLANT
DURAPREP 26ML APPLICATOR (WOUND CARE) ×6 IMPLANT
ELECT CAUTERY BLADE 6.4 (BLADE) ×3 IMPLANT
ELECT REM PT RETURN 9FT ADLT (ELECTROSURGICAL) ×3
ELECTRODE REM PT RTRN 9FT ADLT (ELECTROSURGICAL) ×1 IMPLANT
EVACUATOR 1/8 PVC DRAIN (DRAIN) ×3 IMPLANT
FACESHIELD WRAPAROUND (MASK) ×6 IMPLANT
FACESHIELD WRAPAROUND OR TEAM (MASK) ×2 IMPLANT
GLOVE BIOGEL PI IND STRL 7.0 (GLOVE) ×2 IMPLANT
GLOVE BIOGEL PI INDICATOR 7.0 (GLOVE) ×4
GLOVE ECLIPSE 6.5 STRL STRAW (GLOVE) ×6 IMPLANT
GLOVE ORTHO TXT STRL SZ7.5 (GLOVE) ×3 IMPLANT
GOWN STRL REUS W/ TWL LRG LVL3 (GOWN DISPOSABLE) ×1 IMPLANT
GOWN STRL REUS W/ TWL XL LVL3 (GOWN DISPOSABLE) ×1 IMPLANT
GOWN STRL REUS W/TWL LRG LVL3 (GOWN DISPOSABLE) ×3
GOWN STRL REUS W/TWL XL LVL3 (GOWN DISPOSABLE) ×3
HANDPIECE INTERPULSE COAX TIP (DISPOSABLE) ×3
IMMOBILIZER KNEE 22 UNIV (SOFTGOODS) ×3 IMPLANT
IMMOBILIZER KNEE 24 THIGH 36 (MISCELLANEOUS) IMPLANT
IMMOBILIZER KNEE 24 UNIV (MISCELLANEOUS)
KIT BASIN OR (CUSTOM PROCEDURE TRAY) ×3 IMPLANT
KIT ROOM TURNOVER OR (KITS) ×3 IMPLANT
KNEE/VIT E POLY LINER LEVEL 1B ×2 IMPLANT
MANIFOLD NEPTUNE II (INSTRUMENTS) ×3 IMPLANT
NDL 18GX1X1/2 (RX/OR ONLY) (NEEDLE) ×1 IMPLANT
NDL 25GX 5/8IN NON SAFETY (NEEDLE) ×1 IMPLANT
NEEDLE 18GX1X1/2 (RX/OR ONLY) (NEEDLE) ×3 IMPLANT
NEEDLE 25GX 5/8IN NON SAFETY (NEEDLE) ×3 IMPLANT
NS IRRIG 1000ML POUR BTL (IV SOLUTION) ×3 IMPLANT
PACK TOTAL JOINT (CUSTOM PROCEDURE TRAY) ×3 IMPLANT
PAD ARMBOARD 7.5X6 YLW CONV (MISCELLANEOUS) ×6 IMPLANT
PAD CAST 4YDX4 CTTN HI CHSV (CAST SUPPLIES) ×1 IMPLANT
PADDING CAST COTTON 4X4 STRL (CAST SUPPLIES) ×3
PADDING CAST COTTON 6X4 STRL (CAST SUPPLIES) ×3 IMPLANT
SET HNDPC FAN SPRY TIP SCT (DISPOSABLE) ×1 IMPLANT
SPONGE GAUZE 4X4 12PLY (GAUZE/BANDAGES/DRESSINGS) ×3 IMPLANT
SPONGE GAUZE 4X4 12PLY STER LF (GAUZE/BANDAGES/DRESSINGS) ×2 IMPLANT
STRIP CLOSURE SKIN 1/2X4 (GAUZE/BANDAGES/DRESSINGS) ×4 IMPLANT
SUCTION FRAZIER TIP 10 FR DISP (SUCTIONS) ×3 IMPLANT
SUT MNCRL AB 4-0 PS2 18 (SUTURE) ×3 IMPLANT
SUT VIC AB 0 CT1 27 (SUTURE)
SUT VIC AB 0 CT1 27XBRD ANBCTR (SUTURE) IMPLANT
SUT VIC AB 1 CT1 27 (SUTURE) ×6
SUT VIC AB 1 CT1 27XBRD ANBCTR (SUTURE) ×2 IMPLANT
SUT VIC AB 2-0 CT1 27 (SUTURE) ×6
SUT VIC AB 2-0 CT1 TAPERPNT 27 (SUTURE) ×2 IMPLANT
SYR 50ML LL SCALE MARK (SYRINGE) ×3 IMPLANT
SYR CONTROL 10ML LL (SYRINGE) ×3 IMPLANT
TOWEL OR 17X24 6PK STRL BLUE (TOWEL DISPOSABLE) ×3 IMPLANT
TOWEL OR 17X26 10 PK STRL BLUE (TOWEL DISPOSABLE) ×3 IMPLANT
WATER STERILE IRR 1000ML POUR (IV SOLUTION) ×6 IMPLANT

## 2013-12-14 NOTE — Transfer of Care (Signed)
Immediate Anesthesia Transfer of Care Note  Patient: Patricia Austin  Procedure(s) Performed: Procedure(s): LEFT TOTAL KNEE ARTHROPLASTY (Left)  Patient Location: PACU  Anesthesia Type:General  Level of Consciousness: awake  Airway & Oxygen Therapy: Patient Spontanous Breathing and Patient connected to nasal cannula oxygen  Post-op Assessment: Report given to PACU RN, Post -op Vital signs reviewed and stable and Patient moving all extremities X 4  Post vital signs: Reviewed and stable  Complications: No apparent anesthesia complications

## 2013-12-14 NOTE — Evaluation (Signed)
Physical Therapy Evaluation Patient Details Name: Patricia Austin MRN: [id number] DOB: Nov 15, 1945 Today's Date: 12/14/2013   History of Present Illness  68 y.o female s/p left total knee arthroplasty. Hx of diabetes, HTN, and GERD.  Clinical Impression  Pt is s/p left TKA resulting in the deficits listed below (see PT Problem List). Tolerated ambulating up to 15 feet today with min guard for safety on post op day #0. Anticipate pt will progress well with therapy and plans to d/c home with 24 hour care from husband. Pt will benefit from skilled PT to increase their independence and safety with mobility to allow discharge to the venue listed below.      Follow Up Recommendations Home health PT;Supervision for mobility/OOB    Equipment Recommendations  None recommended by PT    Recommendations for Other Services OT consult     Precautions / Restrictions Precautions Precautions: Knee Precaution Comments: Reviewed precautions and footsie roll use Required Braces or Orthoses: Knee Immobilizer - Left Restrictions Weight Bearing Restrictions: Yes LLE Weight Bearing: Weight bearing as tolerated      Mobility  Bed Mobility Overal bed mobility: Needs Assistance Bed Mobility: Supine to Sit     Supine to sit: Min guard;HOB elevated     General bed mobility comments: Min guard for safety with HOB slightly elevated. VCs for technique. Heavy use of rail  Transfers Overall transfer level: Needs assistance Equipment used: Rolling walker (2 wheeled) Transfers: Sit to/from Stand Sit to Stand: Min assist         General transfer comment: Min assist for boost from lowest bed setting. VCs for hand placement. Instructed to increase WB through LLE upon standing  Ambulation/Gait Ambulation/Gait assistance: Min guard Ambulation Distance (Feet): 15 Feet Assistive device: Rolling walker (2 wheeled) Gait Pattern/deviations: Step-to pattern;Decreased step length - right;Decreased stance time -  left;Antalgic   Gait velocity interpretation: Below normal speed for age/gender General Gait Details: Slow and guarded gait. No instances of knee buckling with knee immobilizer in place. Educated on safe use of DME and adjusted walker for approriate height. No loss of balance.  Stairs            Wheelchair Mobility    Modified Rankin (Stroke Patients Only)       Balance Overall balance assessment: Needs assistance Sitting-balance support: No upper extremity supported;Feet supported Sitting balance-Leahy Scale: Good     Standing balance support: No upper extremity supported Standing balance-Leahy Scale: Fair                               Pertinent Vitals/Pain Pt reports pain as moderate Nurse notified as pt requests pain medication Patient repositioned in chair for comfort.     Home Living Family/patient expects to be discharged to:: Private residence Living Arrangements: Spouse/significant other Available Help at Discharge: Family;Available 24 hours/day Type of Home: House Home Access: Stairs to enter Entrance Stairs-Rails: Left Entrance Stairs-Number of Steps: 4 Home Layout: One level Home Equipment: Walker - 2 wheels;Bedside commode;Shower seat;Tub bench      Prior Function Level of Independence: Independent               Hand Dominance   Dominant Hand: Right    Extremity/Trunk Assessment   Upper Extremity Assessment: Defer to OT evaluation           Lower Extremity Assessment: LLE deficits/detail   LLE Deficits / Details: decreased strength and ROM as expected  post op TKA     Communication   Communication: No difficulties  Cognition Arousal/Alertness: Awake/alert Behavior During Therapy: WFL for tasks assessed/performed Overall Cognitive Status: Within Functional Limits for tasks assessed                      General Comments      Exercises Total Joint Exercises Ankle Circles/Pumps: AROM;Both;10  reps;Supine Quad Sets: AROM;Left;10 reps;Seated      Assessment/Plan    PT Assessment Patient needs continued PT services  PT Diagnosis Difficulty walking;Abnormality of gait;Acute pain   PT Problem List Decreased strength;Decreased range of motion;Decreased activity tolerance;Decreased balance;Decreased mobility;Decreased knowledge of use of DME;Decreased knowledge of precautions;Impaired sensation;Pain;Obesity  PT Treatment Interventions DME instruction;Gait training;Stair training;Functional mobility training;Therapeutic activities;Therapeutic exercise;Balance training;Neuromuscular re-education;Patient/family education;Modalities   PT Goals (Current goals can be found in the Care Plan section) Acute Rehab PT Goals Patient Stated Goal: Go home PT Goal Formulation: With patient Time For Goal Achievement: 12/21/13 Potential to Achieve Goals: Good    Frequency 7X/week   Barriers to discharge        Co-evaluation               End of Session Equipment Utilized During Treatment: Left knee immobilizer Activity Tolerance: Patient tolerated treatment well Patient left: in chair;with call bell/phone within reach;with family/visitor present Nurse Communication: Mobility status;Patient requests pain meds         Time: 1410-3013 PT Time Calculation (min): 28 min   Charges:   PT Evaluation $Initial PT Evaluation Tier I: 1 Procedure PT Treatments $Therapeutic Activity: 8-22 mins   PT G Codes:         Elayne Snare, [place]  Ellouise Newer 12/14/2013, 4:35 PM

## 2013-12-14 NOTE — Discharge Instructions (Signed)
Total Knee Replacement Care After Refer to this sheet in the next few weeks. These instructions provide you with information on caring for yourself after your procedure. Your caregiver also may give you specific instructions. Your treatment has been planned according to the most current medical practices, but problems sometimes occur. Call your caregiver if you have any problems or questions after your procedure. HOME CARE INSTRUCTIONS   Weight bearing as tolerated.  Take Aspirin 1 tab a day for the next 30 days to prevent blood clots.  May change dressing daily starting on Saturday.  May shower on Monday but do not soak incision.  May apply ice for up to 20 minutes at a time for pain and swelling.  Follow up appointment in two weeks.     See a physical therapist as directed by your caregiver.  Take over-the-counter or prescription medicines for pain, discomfort, or fever only as directed by your caregiver.  Avoid lifting or driving until you are instructed otherwise.  If you have been sent home with a continuous passive motion machine, use it as directed by your caregiver. SEEK MEDICAL CARE IF:  You have difficulty breathing.  Your wound is red, swollen, or has become increasingly painful.  You have pus draining from your wound.  You have a bad smell coming from your wound.  You have persistent bleeding from your wound.  Your wound breaks open after sutures (stitches) or staples have been removed. SEEK IMMEDIATE MEDICAL CARE IF:   You have a fever.  You have a rash.  You have pain or swelling in your calf or thigh.  You have shortness of breath or chest pain.  Your range of motion in your knee is decreasing rather than increasing. MAKE SURE YOU:   Understand these instructions.  Will watch your condition.  Will get help right away if you are not doing well or get worse. Document Released: 12/27/2004 Document Revised: 12/09/2011 Document Reviewed: 07/29/2011 [facility]  Patient Information 2015 [place], [place]. This information is not intended to replace advice given to you by your health care provider. Make sure you discuss any questions you have with your health care provider.

## 2013-12-14 NOTE — Progress Notes (Signed)
Utilization review completed.  

## 2013-12-14 NOTE — Discharge Summary (Addendum)
Patient ID: KARE DADO MRN: [id number] DOB/AGE: Nov 16, 1945 68 y.o.  Admit date: 12/14/2013 Discharge date: 12/15/2013  Admission Diagnoses:  Active Problems:   DJD (degenerative joint disease) of knee   Discharge Diagnoses:  Same ABLA:  Pre-op Hgb 11.8 and is at 9.4 post-op.  Patient asymptomatic but will continue to follow.  Past Medical History  Diagnosis Date  . Diabetes mellitus without complication   . Hypertension   . GERD (gastroesophageal reflux disease)   . Allergy   . Dysrhythmia     told that she sometimes has extra beats. Does not see cardiologist, had a stress test many yrs. ago>10 yrs.   . Shortness of breath     walking long distances   . Fibromyalgia   . Arthritis     OA, knees   . squamous cell carcinoma scalp     squamous, on scalp, & has had several surgeries on scalp, for plastics repair of scalp   . Complication of anesthesia 2002    difficulty /w intubation, with plastic surgery , because she was feeling so anxious. 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 .   Pt. reports that she  is very sensitive to med. & is hard  to wake up.    Surgeries: Procedure(s): LEFT TOTAL KNEE ARTHROPLASTY on 12/14/2013   Consultants:    Discharged Condition: Improved  Hospital Course: KELSAY HAGGARD is an 68 y.o. female who was admitted 12/14/2013 for operative treatment of<principal problem not specified>. Patient has severe unremitting pain that affects sleep, daily activities, and work/hobbies. After pre-op clearance the patient was taken to the operating room on 12/14/2013 and underwent  Procedure(s): LEFT TOTAL KNEE ARTHROPLASTY.    Patient was given perioperative antibiotics:     Anti-infectives   Start     Dose/Rate Route Frequency Ordered Stop   12/14/13 1500  ceFAZolin (ANCEF) IVPB 2 g/50 mL premix     2 g 100 mL/hr over 30 Minutes Intravenous Every 6 hours 12/14/13 1452 12/14/13 2311   12/14/13 0600  ceFAZolin (ANCEF) IVPB 2 g/50 mL premix     2 g 100  mL/hr over 30 Minutes Intravenous On call to O.R. 12/13/13 1442 12/14/13 1123       Patient was given sequential compression devices, early ambulation, and chemoprophylaxis to prevent DVT.  Patient benefited maximally from hospital stay and there were no complications.    Recent vital signs:  Patient Vitals for the past 24 hrs:  BP Temp Temp src Pulse Resp SpO2 Height Weight  12/15/13 0447 130/46 mmHg 98.8 F (37.1 C) - 109 18 90 % - -  12/14/13 2058 121/51 mmHg 97.5 F (36.4 C) - 95 18 91 % - -  12/14/13 1600 - - - - 18 - - -  12/14/13 1455 134/64 mmHg 97.2 F (36.2 C) - - 16 95 % - -  12/14/13 1440 132/64 mmHg 97.2 F (36.2 C) - - 16 95 % - -  12/14/13 1407 - 97.7 F (36.5 C) - - - - - -  12/14/13 1405 - - - 94 16 97 % - -  12/14/13 1400 - - - 92 17 94 % - -  12/14/13 1353 129/52 mmHg - - 93 14 96 % - -  12/14/13 1345 - - - 95 18 96 % - -  12/14/13 1338 132/52 mmHg - - 94 16 94 % - -  12/14/13 1330 - - - 92 15 94 % - -  12/14/13 1323 138/58  mmHg - - 92 15 94 % - -  12/14/13 1315 - - - 92 17 94 % - -  12/14/13 1308 153/62 mmHg - - 92 17 95 % - -  12/14/13 1300 - - - 91 18 94 % - -  12/14/13 1254 - 97.6 F (36.4 C) - - - - - -  12/14/13 1253 159/77 mmHg - - 93 13 97 % - -  12/14/13 1004 131/43 mmHg 97.2 F (36.2 C) Oral 77 18 97 % 5\' 2"  (1.575 m) 88.8 kg (195 lb 12.3 oz)     Recent laboratory studies:   Recent Labs  12/12/13 1600 12/15/13 0420  WBC  --  12.4*  HGB  --  9.4*  HCT  --  29.2*  PLT  --  235  NA 140 137  K 5.7* 5.4*  CL 98 100  CO2 24 25  BUN 21 25*  CREATININE 1.09* 1.17*  GLUCOSE 120* 189*  CALCIUM 9.7 9.4     Discharge Medications:     Medication List    STOP taking these medications       diclofenac 75 MG EC tablet  Commonly known as:  VOLTAREN     HYDROcodone-acetaminophen 5-325 MG per tablet  Commonly known as:  NORCO/VICODIN      TAKE these medications       amLODipine 5 MG tablet  Commonly known as:  NORVASC  TAKE 1  TABLET DAILY before breakfast     aspirin EC 325 MG tablet  Take 1 tablet (325 mg total) by mouth daily.     benazepril 40 MG tablet  Commonly known as:  LOTENSIN  Take 40 mg by mouth daily before breakfast.     bisacodyl 5 MG EC tablet  Commonly known as:  DULCOLAX  Take 1 tablet (5 mg total) by mouth daily as needed for moderate constipation.     carvedilol 25 MG tablet  Commonly known as:  COREG  Take 1 tablet (25 mg total) by mouth 2 (two) times daily with a meal.     DULoxetine 60 MG capsule  Commonly known as:  CYMBALTA  Take 60 mg by mouth daily.     Exenatide ER 2 MG Pen  Inject 2 mg into the skin once a week.     furosemide 20 MG tablet  Commonly known as:  LASIX  Take 20 mg by mouth daily.     gabapentin 300 MG capsule  Commonly known as:  NEURONTIN  Take 300 mg by mouth 2 (two) times daily.     glipiZIDE-metformin 2.5-500 MG per tablet  Commonly known as:  METAGLIP  Take 1 tablet by mouth 2 (two) times daily before a meal.     methocarbamol 500 MG tablet  Commonly known as:  ROBAXIN  Take 1 tablet (500 mg total) by mouth 4 (four) times daily.     ondansetron 4 MG tablet  Commonly known as:  ZOFRAN  Take 1 tablet (4 mg total) by mouth every 8 (eight) hours as needed for nausea or vomiting.     oxyCODONE-acetaminophen 5-325 MG per tablet  Commonly known as:  ROXICET  Take 1-2 tablets by mouth every 4 (four) hours as needed.     Vitamin D3 5000 UNITS Caps  Take 5,000 Units by mouth daily.        Diagnostic Studies: Dg Chest 2 View  12/12/2013   CLINICAL DATA:  Preoperative evaluation  EXAM: CHEST  2 VIEW  COMPARISON:  10/19/13  FINDINGS: Cardiac shadow is stable. A calcified granuloma is again noted in the left lung base. The lungs are otherwise clear. Degenerative changes of the thoracic spine are noted. No other focal abnormality is noted.  IMPRESSION: No active cardiopulmonary disease.   Electronically Signed   By: Inez Catalina M.D.   On: 12/12/2013  16:40   Dg Knee Left Port  12/14/2013   CLINICAL DATA:  Total left knee arthroplasty  EXAM: PORTABLE LEFT KNEE - 1-2 VIEW  COMPARISON:  None.  FINDINGS: Left knee replacement is identified without malalignment. Postsurgical changes including soft tissue air and drain are noted.  IMPRESSION: Left knee replacement without malalignment. Postsurgical changes as described.   Electronically Signed   By: Abelardo Diesel M.D.   On: 12/14/2013 13:45    Disposition: 01-Home or Self Care ABLA:  Pre-op Hgb 11.8 and is at 9.4 post-op.  Patient asymptomatic but will continue to follow.  Discharge Instructions   CPM    Complete by:  As directed   Continuous passive motion machine (CPM):      Use the CPM from 0- to 60 for 6 hours per day.      You may increase by 10 per day.  You may break it up into 2 or 3 sessions per day.      Use CPM for 2-3 weeks or until you are told to stop.     Call MD / Call 911    Complete by:  As directed   If you experience chest pain or shortness of breath, CALL 911 and be transported to the hospital emergency room.  If you develope a fever above 101 F, pus (white drainage) or increased drainage or redness at the wound, or calf pain, call your surgeon's office.     Change dressing    Complete by:  As directed   Change dressing on Saturday, then change the dressing daily with sterile 4 x 4 inch gauze dressing and apply TED hose.  You may clean the incision with alcohol prior to redressing.     Constipation Prevention    Complete by:  As directed   Drink plenty of fluids.  Prune juice may be helpful.  You may use a stool softener, such as Colace (over the counter) 100 mg twice a day.  Use MiraLax (over the counter) for constipation as needed.     Diet - low sodium heart healthy    Complete by:  As directed      Discharge instructions    Complete by:  As directed   Weight bearing as tolerated.  Take Aspirin 1 tab a day for the next 30 days to prevent blood clots.  Change dressing  daily starting on Saturday.  May shower on Monday but do not soak incision.  May apply ice for up to 20 minutes at a time for pain and swelling.  Follow up appointment in two weeks.     Do not put a pillow under the knee. Place it under the heel.    Complete by:  As directed   Place gray foam under operative heel when in bed or in a chair to work on extension     Increase activity slowly as tolerated    Complete by:  As directed      TED hose    Complete by:  As directed   Use stockings (TED hose) for 2 weeks on both leg(s).  You may remove them at night for  sleeping.           Follow-up Information   Follow up with [facility], MD. Schedule an appointment as soon as possible for a visit in 2 weeks.   Specialty:  Orthopedic Surgery   Contact information:   [address] [place] [address] [phone]        Signed: Larae Grooms 12/15/2013, 6:46 AM

## 2013-12-14 NOTE — Progress Notes (Signed)
Orthopedic Tech Progress Note Patient Details:  Patricia Austin 1945/12/07 [id number]  CPM Left Knee CPM Left Knee: On Left Knee Flexion (Degrees): 60 Left Knee Extension (Degrees): 0 Additional Comments: Trapeze bar and foot roll   Irish Elders 12/14/2013, 2:02 PM

## 2013-12-14 NOTE — H&P (View-Only) (Signed)
TOTAL KNEE ADMISSION H&P  Patient is being admitted for left total knee arthroplasty.  Subjective:  Chief Complaint:left knee pain.  HPI: Patricia Austin, 68 y.o. female, has a history of pain and functional disability in the left knee due to arthritis and has failed non-surgical conservative treatments for greater than 12 weeks to includeNSAID's and/or analgesics, use of assistive devices and activity modification.  Onset of symptoms was gradual, starting 2 years ago with gradually worsening course since that time. The patient noted no past surgery on the left knee(s).  Patient currently rates pain in the left knee(s) at 5 out of 10 with activity. Patient has night pain, worsening of pain with activity and weight bearing, pain that interferes with activities of daily living, pain with passive range of motion, crepitus and joint swelling.  Patient has evidence of subchondral sclerosis and joint space narrowing by imaging studies. There is no active infection.  Patient Active Problem List   Diagnosis Date Noted  . Type II or unspecified type diabetes mellitus without mention of complication, uncontrolled 02/09/2013  . Morbid obesity 02/09/2013  . Hypertension 02/09/2013  . Hyperlipidemia with target LDL less than 100 02/09/2013  . Osteoarthritis of both feet 02/09/2013  . Osteoarthritis of both knees 02/09/2013  . GERD (gastroesophageal reflux disease) 02/09/2013  . Allergic rhinitis 02/09/2013  . Squamous cell carcinoma of scalp 02/09/2013   Past Medical History  Diagnosis Date  . Diabetes mellitus without complication   . Hypertension   . GERD (gastroesophageal reflux disease)   . Allergy   . squamous cell carcinoma scalp     Past Surgical History  Procedure Laterality Date  . Abdominal hysterectomy    . Spine surgery x2    . Appendectomy    . Scalp surgery       (Not in a hospital admission) No Known Allergies  History  Substance Use Topics  . Smoking status: Never Smoker   .  Smokeless tobacco: Not on file  . Alcohol Use: Yes    Family History  Problem Relation Age of Onset  . Early death Mother   . Thrombosis Father      Review of Systems  Constitutional: Negative.   HENT: Negative.   Eyes: Negative.   Respiratory: Negative.   Cardiovascular: Negative.   Gastrointestinal: Negative.   Genitourinary: Negative.   Musculoskeletal: Positive for joint pain.  Skin: Negative.   Neurological: Negative.   Endo/Heme/Allergies: Negative.   Psychiatric/Behavioral: Negative.     Objective:  Physical Exam  Constitutional: She is oriented to person, place, and time. She appears well-developed and well-nourished.  HENT:  Head: Normocephalic and atraumatic.  Eyes: EOM are normal. Pupils are equal, round, and reactive to light.  Neck: Normal range of motion.  Cardiovascular: Normal rate and regular rhythm.  Exam reveals no gallop and no friction rub.   No murmur heard. Respiratory: Effort normal and breath sounds normal. No respiratory distress. She has no wheezes. She has no rales.  GI: Soft. Bowel sounds are normal. She exhibits no distension. There is no tenderness.  Musculoskeletal:  68 year old female. Markedly antalgic gait varus thrust left greater than right. 5'3" 190 pounds. Negative straight leg raise both sides negative log roll both hips. Left knee has range of motion 0-110. She has 5-7 degrees of varus not very correctable. Tibiofemoral and patellofemoral crepitus. Right knee has range of motion 0-120 degrees. Just a little bit of varus. Patellofemoral greater than tibiofemoral crepitus. Good stability both sides. Reasonable  strength. She is neurovascularly intact distally. Decreased lumbar motion with soreness  lower lumbar spine and over SI joints sciatic notch.  Neurological: She is alert and oriented to person, place, and time.  Skin: Skin is warm and dry.  Psychiatric: She has a normal mood and affect. Her behavior is normal. Judgment and thought  content normal.    Vital signs in last 24 hours: @VSRANGES @  Labs:   Estimated body mass index is 35.08 kg/(m^2) as calculated from the following:   Height as of 10/19/13: 5\' 3"  (1.6 m).   Weight as of 10/19/13: 89.812 kg (198 lb).   Imaging Review Plain radiographs demonstrate severe degenerative joint disease of the left knee(s). The overall alignment ismild varus. The bone quality appears to be fair for age and reported activity level.  Assessment/Plan:  End stage arthritis, left knee   The patient history, physical examination, clinical judgment of the provider and imaging studies are consistent with end stage degenerative joint disease of the left knee(s) and total knee arthroplasty is deemed medically necessary. The treatment options including medical management, injection therapy arthroscopy and arthroplasty were discussed at length. The risks and benefits of total knee arthroplasty were presented and reviewed. The risks due to aseptic loosening, infection, stiffness, patella tracking problems, thromboembolic complications and other imponderables were discussed. The patient acknowledged the explanation, agreed to proceed with the plan and consent was signed. Patient is being admitted for inpatient treatment for surgery, pain control, PT, OT, prophylactic antibiotics, VTE prophylaxis, progressive ambulation and ADL's and discharge planning. The patient is planning to be discharged home with home health services

## 2013-12-14 NOTE — Plan of Care (Signed)
Problem: Consults Goal: Diagnosis- Total Joint Replacement Primary Total Knee Left     

## 2013-12-14 NOTE — Interval H&P Note (Signed)
History and Physical Interval Note:  12/14/2013 8:29 AM  Patricia Austin  has presented today for surgery, with the diagnosis of DJD LEFT KNEE  The various methods of treatment have been discussed with the patient and family. After consideration of risks, benefits and other options for treatment, the patient has consented to  Procedure(s): LEFT TOTAL KNEE ARTHROPLASTY (Left) as a surgical intervention .  The patient's history has been reviewed, patient examined, no change in status, stable for surgery.  I have reviewed the patient's chart and labs.  Questions were answered to the patient's satisfaction.     MURPHY,DANIEL F

## 2013-12-14 NOTE — Op Note (Signed)
NAMEMARIHA, Patricia Austin NO.:  [id number]  MEDICAL RECORD NO.:  [id number]  LOCATION:  5N18C                        FACILITY:  [facility]  PHYSICIAN:  Ninetta Lights, M.D. DATE OF BIRTH:  1945/10/06  DATE OF PROCEDURE:  12/14/2013 DATE OF DISCHARGE:                              OPERATIVE REPORT   PREOPERATIVE DIAGNOSIS:  Left knee end-stage degenerative arthritis. Flexion contracture varus alignment.  POSTOPERATIVE DIAGNOSIS:  Left knee end-stage degenerative arthritis. Flexion contracture varus alignment.  PROCEDURE:  Modified minimally invasive left total knee replacement Stryker Triathlon prosthesis.  Soft tissue balancing.  Cemented pegged cruciate retaining #3 femoral component.  Cemented #3 tibial component, 9 mm CS insert.  Cemented resurfacing 32-mm patellar component.  SURGEON:  Ninetta Lights, MD  ASSISTANT:  Doran Stabler, PA present throughout the entire case and necessary for timely completion of procedure.  ANESTHESIA:  General.  BLOOD LOSS:  Minimal.  SPECIMENS:  None.  CULTURES:  None.  COMPLICATIONS:  None.  DRESSINGS:  Soft compressive with knee immobilizer.  DRAINS:  Hemovac x1.  TOURNIQUET TIME:  50 minutes.  DESCRIPTION OF PROCEDURE:  The patient was brought to the operating room, placed on the operating room table in supine position.  After adequate anesthesia had been obtained, tourniquet applied, prepped and draped in usual sterile fashion.  Exsanguinated with elevation of Esmarch.  Tourniquet inflated to 350 mmHg.  A 5 degree flexion contracture, flexion of 100, 5 degrees of varus.  Anterior incision above the patella down to tibial tubercle.  Skin and subcutaneous tissue divided.  Medial arthrotomy, vastus splitting, preserving quad tendon. Knee exposed.  Medial capsule released.  Very tight knee. Intramedullary guide distal femur flexible rod.  A 10-mm resection 5 degrees of valgus.  Using epicondylar axis, the femur was  sized, cut, and fitted for a pegged cruciate retaining #3 component.  Proximal tibial resection extramedullary guide.  Resection below the defect medially.  Sized to #3 component.  Debris cleared throughout.  Nicely balanced in flexion, extension.  Patella exposed, posterior 10 mm removed, drilled, sized, and fitted for a 32-mm component.  Trials put in place.  With a 9 mm insert, very pleased with balancing by mechanical axis, flexion, extension, stability and patellar tracking.  Tibia was marked for rotation and hand reamed.  All trials removed.  Copious irrigation with a pulse irrigating device.  Cement prepared, placed on all components, firmly seated.  Polyethylene attached to tibia, knee reduced.  Patella with a clamp.  Once the cement hardened, the knee was irrigated again.  Soft tissues injected with Exparel.  Hemovac placed through a separate stab wound.  Arthrotomy closed with #1 Vicryl with a subcutaneous and subcuticular closure.  Margins were injected with Marcaine.  Sterile compressive dressing applied.  Tourniquet deflated and removed.  Knee immobilizer applied.  Anesthesia reversed.  Brought to the recovery room.  Tolerated the surgery well.  No complications.     Ninetta Lights, M.D.     DFM/MEDQ  D:  12/14/2013  T:  12/14/2013  Job:  [id number]

## 2013-12-14 NOTE — Anesthesia Procedure Notes (Addendum)
Procedure Name: Intubation Date/Time: 12/14/2013 11:05 AM Performed by: Rush Farmer E Pre-anesthesia Checklist: Patient identified, Emergency Drugs available, Suction available, Patient being monitored and Timeout performed Patient Re-evaluated:Patient Re-evaluated prior to inductionOxygen Delivery Method: Circle system utilized Preoxygenation: Pre-oxygenation with 100% oxygen Intubation Type: IV induction Ventilation: Mask ventilation without difficulty Laryngoscope Size: Mac and 3 Grade View: Grade II Tube type: Oral Tube size: 7.0 mm Number of attempts: 2 Airway Equipment and Method: Bougie stylet Placement Confirmation: positive ETCO2 and breath sounds checked- equal and bilateral Secured at: 21 cm Tube secured with: Tape Dental Injury: Teeth and Oropharynx as per pre-operative assessment  Comments: DL x1 with MAC 3 unable to visualize VC per CRNA. Reduced neck mobitity. AOI on 2nd DL with MAC 3 & using bougie, grade II view per Dr. Tamala Julian.

## 2013-12-14 NOTE — Anesthesia Postprocedure Evaluation (Signed)
  Anesthesia Post-op Note  Patient: Patricia Austin  Procedure(s) Performed: Procedure(s): LEFT TOTAL KNEE ARTHROPLASTY (Left)  Patient Location: PACU  Anesthesia Type:General  Level of Consciousness: awake, oriented, sedated and patient cooperative  Airway and Oxygen Therapy: Patient Spontanous Breathing  Post-op Pain: moderate  Post-op Assessment: Post-op Vital signs reviewed, Patient's Cardiovascular Status Stable, Respiratory Function Stable, Patent Airway, No signs of Nausea or vomiting and Pain level controlled  Post-op Vital Signs: stable  Last Vitals:  Filed Vitals:   12/14/13 1345  BP:   Pulse: 95  Temp:   Resp: 18    Complications: No apparent anesthesia complications

## 2013-12-14 NOTE — Anesthesia Preprocedure Evaluation (Signed)
Anesthesia Evaluation  Patient identified by MRN, date of birth, ID band Patient awake    Reviewed: Allergy & Precautions, H&P , NPO status , Patient's Chart, lab work & pertinent test results  Airway Mallampati: I      Dental  (+) Teeth Intact   Pulmonary          Cardiovascular hypertension, Pt. on medications     Neuro/Psych    GI/Hepatic   Endo/Other  diabetes, Well Controlled, Type 2  Renal/GU      Musculoskeletal   Abdominal   Peds  Hematology   Anesthesia Other Findings   Reproductive/Obstetrics                           Anesthesia Physical Anesthesia Plan  ASA: III  Anesthesia Plan: General   Post-op Pain Management:    Induction: Intravenous  Airway Management Planned: LMA and Oral ETT  Additional Equipment:   Intra-op Plan:   Post-operative Plan: Extubation in OR  Informed Consent: I have reviewed the patients History and Physical, chart, labs and discussed the procedure including the risks, benefits and alternatives for the proposed anesthesia with the patient or authorized representative who has indicated his/her understanding and acceptance.   Dental advisory given  Plan Discussed with: CRNA and Anesthesiologist  Anesthesia Plan Comments:         Anesthesia Quick Evaluation

## 2013-12-15 ENCOUNTER — Encounter (HOSPITAL_COMMUNITY): Payer: Self-pay | Admitting: Orthopedic Surgery

## 2013-12-15 HISTORY — PX: TOTAL KNEE ARTHROPLASTY: SHX125

## 2013-12-15 LAB — BASIC METABOLIC PANEL
BUN: 25 mg/dL — ABNORMAL HIGH (ref 6–23)
CO2: 25 mEq/L (ref 19–32)
Calcium: 9.4 mg/dL (ref 8.4–10.5)
Chloride: 100 mEq/L (ref 96–112)
Creatinine, Ser: 1.17 mg/dL — ABNORMAL HIGH (ref 0.50–1.10)
GFR calc Af Amer: 54 mL/min — ABNORMAL LOW (ref >90)
GFR, EST NON AFRICAN AMERICAN: 46 mL/min — AB (ref >90)
GLUCOSE: 189 mg/dL — AB (ref 70–99)
POTASSIUM: 5.4 meq/L — AB (ref 3.7–5.3)
SODIUM: 137 meq/L (ref 137–147)

## 2013-12-15 LAB — GLUCOSE, CAPILLARY
GLUCOSE-CAPILLARY: 181 mg/dL — AB (ref 70–99)
Glucose-Capillary: 279 mg/dL — ABNORMAL HIGH (ref 70–99)

## 2013-12-15 LAB — CBC
HCT: 29.2 % — ABNORMAL LOW (ref 36.0–46.0)
HEMOGLOBIN: 9.4 g/dL — AB (ref 12.0–15.0)
MCH: 30.2 pg (ref 26.0–34.0)
MCHC: 32.2 g/dL (ref 30.0–36.0)
MCV: 93.9 fL (ref 78.0–100.0)
PLATELETS: 235 10*3/uL (ref 150–400)
RBC: 3.11 MIL/uL — ABNORMAL LOW (ref 3.87–5.11)
RDW: 13.6 % (ref 11.5–15.5)
WBC: 12.4 10*3/uL — ABNORMAL HIGH (ref 4.0–10.5)

## 2013-12-15 NOTE — Care Management Note (Signed)
CARE MANAGEMENT NOTE 12/15/2013  Patient:  Patricia Austin, Patricia Austin   Account Number:  [id number]  Date Initiated:  12/15/2013  Documentation initiated by:  Ricki Miller  Subjective/Objective Assessment:   68 yr old female s/p left total knee arthroplasty.     Action/Plan:   Case manager spoke with patient and husband concerning home health and DME needs at discharge. Patient preoperatively setup with [facility], no changes.   Anticipated DC Date:  12/15/2013   Anticipated DC Plan:  [place]  CM consult      [facility] Choice  HOME HEALTH  DURABLE MEDICAL EQUIPMENT   Choice offered to / List presented to:  C-1 Patient   DME arranged  CPM      DME agency  [employer]     HH arranged  HH-2 PT      [place].   Status of service:  Completed, signed off Medicare Important Message given?  NA - LOS <3 / Initial given by admissions (If response is "NO", the following Medicare IM given date fields will be blank) Date Medicare IM given:  12/15/2013 Date Additional Medicare IM given:    Discharge Disposition:  [place]  Per UR Regulation:  Reviewed for med. necessity/level of care/duration of stay

## 2013-12-15 NOTE — Evaluation (Signed)
Occupational Therapy Evaluation and Discharge Patient Details Name: Patricia Austin MRN: [id number] DOB: 1946/05/21 Today's Date: 12/15/2013    History of Present Illness 68 y.o female s/p left total knee arthroplasty. Hx of diabetes, HTN, and GERD.   Clinical Impression   PTA pt was independent and now presents with generalized weakness and decreased ROM limiting her independence with self care activities. Educated pt on compensatory strategies and sequencing for LB ADLs, KI and footsie roll wearing schedule and available AE. Pt will have supervision at home from family and feels confident in self care activities, therefore no further OT is needed, we will sign off.    Follow Up Recommendations  No OT follow up;Supervision - Intermittent          Precautions / Restrictions Precautions Precautions: Knee Precaution Comments: Reviewed precautions and KI/footsie roll use Required Braces or Orthoses: Knee Immobilizer - Left Knee Immobilizer - Left: On when out of bed or walking;Other (comment) (when sleeping) Restrictions LLE Weight Bearing: Weight bearing as tolerated      Mobility Bed Mobility               General bed mobility comments: Pt up in chair upon OT arrival.  Transfers Overall transfer level: Needs assistance Equipment used: Rolling walker (2 wheeled) Transfers: Sit to/from Stand Sit to Stand: Supervision         General transfer comment: for safety with VCs for hand placement.     Balance Overall balance assessment: Needs assistance Sitting-balance support: No upper extremity supported Sitting balance-Leahy Scale: Good       Standing balance-Leahy Scale: Fair                              ADL Overall ADL's : Needs assistance/impaired Eating/Feeding: Sitting;Independent   Grooming: Sitting;Modified independent   Upper Body Bathing: Sitting;Modified independent   Lower Body Bathing: Sit to/from stand;Supervison/ safety   Upper Body  Dressing : Sitting;Modified independent   Lower Body Dressing: Sit to/from stand;Supervision/safety;With caregiver independent assisting   Toilet Transfer: Supervision/safety;BSC;RW;Ambulation Armed forces technical officer Details (indicate cue type and reason): BSC over toilet Toileting- Clothing Manipulation and Hygiene: Supervision/safety;Sit to/from stand       Functional mobility during ADLs: Supervision/safety;Rolling walker General ADL Comments: Educated pt on LB bathing/dressing sequencing and wearing schedule of KI and footsie roll. Educated pt on available AE but she stated that her husband will be at home to help and therefore she wouldnt need that.                Pertinent Vitals/Pain No c/o pain during tx.     Hand Dominance Right   Extremity/Trunk Assessment Upper Extremity Assessment Upper Extremity Assessment: Overall WFL for tasks assessed   Lower Extremity Assessment Lower Extremity Assessment: Defer to PT evaluation       Communication Communication Communication: No difficulties   Cognition Arousal/Alertness: Awake/alert Behavior During Therapy: WFL for tasks assessed/performed Overall Cognitive Status: Within Functional Limits for tasks assessed                                Home Living Family/patient expects to be discharged to:: Private residence Living Arrangements: Spouse/significant other Available Help at Discharge: Family;Available 24 hours/day Type of Home: House Home Access: Stairs to enter [employer] of Steps: 4 Entrance Stairs-Rails: Left Home Layout: One level     Bathroom Shower/Tub: Walk-in shower  Bathroom Toilet: Standard     Home Equipment: Environmental consultant - 2 wheels;Bedside commode;Shower seat;Tub bench          Prior Functioning/Environment Level of Independence: Independent                      OT Goals(Current goals can be found in the care plan section) Acute Rehab OT Goals Patient Stated Goal: Go  home OT Goal Formulation: With patient Time For Goal Achievement: 12/22/13 Potential to Achieve Goals: Good      End of Session Equipment Utilized During Treatment: Rolling walker;Left knee immobilizer Nurse Communication:  (that pt was in the bathroom and she was ready for d/c from OT standpoint)  Activity Tolerance: Patient tolerated treatment well Patient left: with family/visitor present;Other (comment) (in bathroom performing grooming activities)   Time:  -    Charges:    G-CodesLyda Perone 16-Dec-2013, 4:10 PM

## 2013-12-15 NOTE — Progress Notes (Signed)
Subjective: 1 Day Post-Op Procedure(s) (LRB): LEFT TOTAL KNEE ARTHROPLASTY (Left) Patient reports pain as 2 on 0-10 scale.  Patient is doing quite well overall.  No nausea or vomiting.  No lightheadedness/dizziness despite hgb 9.4 post-op.  Positive flatus but no bm as of yet.  Tolerating diet.  Objective: Vital signs in last 24 hours: Temp:  [97.2 F (36.2 C)-98.8 F (37.1 C)] 98.8 F (37.1 C) (06/25 0447) Pulse Rate:  [77-109] 109 (06/25 0447) Resp:  [13-18] 18 (06/25 0447) BP: (121-159)/(43-77) 130/46 mmHg (06/25 0447) SpO2:  [90 %-97 %] 90 % (06/25 0447) Weight:  [88.8 kg (195 lb 12.3 oz)] 88.8 kg (195 lb 12.3 oz) (06/24 1004)  Intake/Output from previous day: 06/24 0701 - 06/25 0700 In: 1430 [P.O.:480; I.V.:950] Out: 475 [Drains:425; Blood:50] Intake/Output this shift: Total I/O In: 480 [P.O.:480] Out: 75 [Drains:75]   Recent Labs  12/15/13 0420  HGB 9.4*    Recent Labs  12/15/13 0420  WBC 12.4*  RBC 3.11*  HCT 29.2*  PLT 235    Recent Labs  12/12/13 1600 12/15/13 0420  NA 140 137  K 5.7* 5.4*  CL 98 100  CO2 24 25  BUN 21 25*  CREATININE 1.09* 1.17*  GLUCOSE 120* 189*  CALCIUM 9.7 9.4   No results found for this basename: LABPT, INR,  in the last 72 hours  Neurologically intact Neurovascular intact Sensation intact distally Intact pulses distally Dorsiflexion/Plantar flexion intact Compartment soft Negative homen's bilaterally hemovac drain pulled by me today  Assessment/Plan: 1 Day Post-Op Procedure(s) (LRB): LEFT TOTAL KNEE ARTHROPLASTY (Left) Advance diet Up with therapy Discharge home with home health most likely today [place], M. LINDSEY 12/15/2013, 6:41 AM

## 2013-12-15 NOTE — Progress Notes (Signed)
Physical Therapy Treatment Patient Details Name: Patricia Austin MRN: [id number] DOB: October 20, 1945 Today's Date: 12/15/2013    History of Present Illness 68 y.o female s/p left total knee arthroplasty. Hx of diabetes, HTN, and GERD.    PT Comments    Patient is progressing well towards physical therapy goals, ambulating up to 250 feet at supervision level while using rolling walker. Stair training completed this AM and pt performs safely. All pertinent education has been reviewed and pt has no further questions. She reports that she feels safe to d/c home with the support of her husband today. Patient is adequate for d/c from PT standpoint when medically ready. Patient will continue to benefit from skilled physical therapy services at home to further improve independence with functional mobility.     Follow Up Recommendations  Home health PT;Supervision for mobility/OOB     Equipment Recommendations  None recommended by PT    Recommendations for Other Services OT consult     Precautions / Restrictions Precautions Precautions: Knee Precaution Comments: Reviewed precautions and footsie roll use Required Braces or Orthoses: Knee Immobilizer - Left Restrictions Weight Bearing Restrictions: Yes LLE Weight Bearing: Weight bearing as tolerated    Mobility  Bed Mobility                  Transfers Overall transfer level: Needs assistance Equipment used: Rolling walker (2 wheeled) Transfers: Sit to/from Stand Sit to Stand: Min guard         General transfer comment: Min guard for safety with VCs for hand placement. Progressed with descent to supervision throughout therapy. sit<>stand performed from lowest bed setting and recliner x2.  Ambulation/Gait Ambulation/Gait assistance: Supervision Ambulation Distance (Feet): 250 Feet (additional bout of 100) Assistive device: Rolling walker (2 wheeled) Gait Pattern/deviations: Step-to pattern;Step-through pattern;Decreased step  length - right;Decreased stance time - left;Antalgic;Trunk flexed   Gait velocity interpretation: Below normal speed for age/gender General Gait Details: Progressing nicely with gait pattern to step-through with verbal cues for technique. Tolerates increased WB through LLE and is able to continuously push RW. No instances of knee buckling with knee immobilizer in place.   Stairs Stairs: Yes Stairs assistance: Min guard Stair Management: One rail Left;Step to pattern;Sideways Number of Stairs: 2 General stair comments: Demonstrated prior to having pt practice. Educated on safe technique for ascend/descending stairs. Pt able to perform safely and states she feels comfortable with this task.  Wheelchair Mobility    Modified Rankin (Stroke Patients Only)       Balance                                    Cognition Arousal/Alertness: Awake/alert Behavior During Therapy: WFL for tasks assessed/performed Overall Cognitive Status: Within Functional Limits for tasks assessed                      Exercises Total Joint Exercises Ankle Circles/Pumps: AROM;Both;10 reps;Seated Quad Sets: AROM;Left;10 reps;Seated    General Comments General comments (skin integrity, edema, etc.): Encouraged increased use of footsie roll for knee to heal optimally.      Pertinent Vitals/Pain Pt reports pain is "increasing" towards end of therapy; requests nurse bring pain medicine. Nurse has been notified Patient repositioned in chair for comfort.     Home Living  Prior Function            PT Goals (current goals can now be found in the care plan section) Acute Rehab PT Goals PT Goal Formulation: With patient Time For Goal Achievement: 12/21/13 Potential to Achieve Goals: Good Progress towards PT goals: Progressing toward goals    Frequency  7X/week    PT Plan Current plan remains appropriate    Co-evaluation             End of  Session Equipment Utilized During Treatment: Left knee immobilizer Activity Tolerance: Patient tolerated treatment well Patient left: in chair;with call bell/phone within reach     Time: 0854-0926 PT Time Calculation (min): 32 min  Charges:  $Gait Training: 23-37 mins                    G Codes:      [employer], [place]  Ellouise Newer 12/15/2013, 10:28 AM

## 2013-12-15 NOTE — Evaluation (Signed)
I have read and agree with this note.   Time in/out:13:59-14:22 Total time: 23 minutes (1Ev and Krebs)  [place], OTR/L [phone]

## 2013-12-19 ENCOUNTER — Telehealth: Payer: Self-pay | Admitting: Nurse Practitioner

## 2013-12-19 MED ORDER — EXENATIDE ER 2 MG ~~LOC~~ PEN: 2.0000 mg | PEN_INJECTOR | SUBCUTANEOUS | Status: DC

## 2013-12-19 NOTE — Telephone Encounter (Signed)
Fax number for patient assistance program [phone].

## 2014-01-04 ENCOUNTER — Other Ambulatory Visit: Payer: Self-pay | Admitting: Nurse Practitioner

## 2014-01-04 ENCOUNTER — Ambulatory Visit: Payer: Medicare Other | Attending: Orthopedic Surgery | Admitting: Physical Therapy

## 2014-01-04 DIAGNOSIS — R5381 Other malaise: Secondary | ICD-10-CM | POA: Insufficient documentation

## 2014-01-04 DIAGNOSIS — Z96659 Presence of unspecified artificial knee joint: Secondary | ICD-10-CM | POA: Diagnosis not present

## 2014-01-04 DIAGNOSIS — M25569 Pain in unspecified knee: Secondary | ICD-10-CM | POA: Insufficient documentation

## 2014-01-04 DIAGNOSIS — IMO0001 Reserved for inherently not codable concepts without codable children: Secondary | ICD-10-CM | POA: Diagnosis present

## 2014-01-04 DIAGNOSIS — E119 Type 2 diabetes mellitus without complications: Secondary | ICD-10-CM | POA: Insufficient documentation

## 2014-01-04 DIAGNOSIS — M25669 Stiffness of unspecified knee, not elsewhere classified: Secondary | ICD-10-CM | POA: Diagnosis not present

## 2014-01-10 ENCOUNTER — Ambulatory Visit: Payer: Medicare Other | Admitting: *Deleted

## 2014-01-10 DIAGNOSIS — IMO0001 Reserved for inherently not codable concepts without codable children: Secondary | ICD-10-CM | POA: Diagnosis not present

## 2014-02-06 ENCOUNTER — Other Ambulatory Visit: Payer: Self-pay | Admitting: Nurse Practitioner

## 2014-02-08 ENCOUNTER — Other Ambulatory Visit: Payer: Self-pay | Admitting: Nurse Practitioner

## 2014-03-13 ENCOUNTER — Other Ambulatory Visit: Payer: Self-pay | Admitting: Family Medicine

## 2014-04-01 ENCOUNTER — Other Ambulatory Visit: Payer: Self-pay | Admitting: *Deleted

## 2014-04-01 MED ORDER — BENAZEPRIL HCL 40 MG PO TABS: 40.0000 mg | ORAL_TABLET | Freq: Every day | ORAL | Status: DC

## 2014-04-07 ENCOUNTER — Other Ambulatory Visit: Payer: Self-pay

## 2014-04-11 ENCOUNTER — Other Ambulatory Visit: Payer: Self-pay | Admitting: Nurse Practitioner

## 2014-04-13 ENCOUNTER — Other Ambulatory Visit: Payer: Self-pay | Admitting: *Deleted

## 2014-04-13 MED ORDER — DICLOFENAC SODIUM 75 MG PO TBEC: DELAYED_RELEASE_TABLET | ORAL | Status: DC

## 2014-04-13 MED ORDER — FUROSEMIDE 20 MG PO TABS: 20.0000 mg | ORAL_TABLET | Freq: Every day | ORAL | Status: DC

## 2014-04-13 MED ORDER — GABAPENTIN 300 MG PO CAPS: 300.0000 mg | ORAL_CAPSULE | Freq: Two times a day (BID) | ORAL | Status: DC

## 2014-04-13 MED ORDER — AMLODIPINE BESYLATE 5 MG PO TABS: ORAL_TABLET | ORAL | Status: DC

## 2014-04-13 MED ORDER — DULOXETINE HCL 60 MG PO CPEP: ORAL_CAPSULE | ORAL | Status: DC

## 2014-04-13 MED ORDER — GLIPIZIDE-METFORMIN HCL 2.5-500 MG PO TABS: 1.0000 | ORAL_TABLET | Freq: Two times a day (BID) | ORAL | Status: DC

## 2014-04-13 MED ORDER — CARVEDILOL 25 MG PO TABS: ORAL_TABLET | ORAL | Status: DC

## 2014-04-14 ENCOUNTER — Other Ambulatory Visit: Payer: Self-pay

## 2014-04-14 MED ORDER — DULOXETINE HCL 60 MG PO CPEP: 60.0000 mg | ORAL_CAPSULE | Freq: Two times a day (BID) | ORAL | Status: DC

## 2014-04-14 MED ORDER — AMLODIPINE BESYLATE 5 MG PO TABS: ORAL_TABLET | ORAL | Status: DC

## 2014-04-14 MED ORDER — GABAPENTIN 300 MG PO CAPS: 300.0000 mg | ORAL_CAPSULE | Freq: Two times a day (BID) | ORAL | Status: DC

## 2014-04-14 MED ORDER — GLIPIZIDE-METFORMIN HCL 2.5-500 MG PO TABS: ORAL_TABLET | ORAL | Status: DC

## 2014-04-14 MED ORDER — CARVEDILOL 25 MG PO TABS: ORAL_TABLET | ORAL | Status: DC

## 2014-04-14 MED ORDER — DICLOFENAC SODIUM 75 MG PO TBEC: DELAYED_RELEASE_TABLET | ORAL | Status: DC

## 2014-04-14 MED ORDER — FUROSEMIDE 20 MG PO TABS: 20.0000 mg | ORAL_TABLET | Freq: Every day | ORAL | Status: DC

## 2014-04-25 ENCOUNTER — Ambulatory Visit (INDEPENDENT_AMBULATORY_CARE_PROVIDER_SITE_OTHER): Payer: Medicare Other

## 2014-04-25 DIAGNOSIS — Z23 Encounter for immunization: Secondary | ICD-10-CM

## 2014-05-05 ENCOUNTER — Ambulatory Visit (INDEPENDENT_AMBULATORY_CARE_PROVIDER_SITE_OTHER): Payer: Medicare Other

## 2014-05-05 DIAGNOSIS — Z23 Encounter for immunization: Secondary | ICD-10-CM

## 2014-07-17 DIAGNOSIS — L570 Actinic keratosis: Secondary | ICD-10-CM | POA: Diagnosis not present

## 2014-07-17 DIAGNOSIS — L814 Other melanin hyperpigmentation: Secondary | ICD-10-CM | POA: Diagnosis not present

## 2014-07-17 DIAGNOSIS — D2261 Melanocytic nevi of right upper limb, including shoulder: Secondary | ICD-10-CM | POA: Diagnosis not present

## 2014-07-17 DIAGNOSIS — L821 Other seborrheic keratosis: Secondary | ICD-10-CM | POA: Diagnosis not present

## 2014-08-02 ENCOUNTER — Other Ambulatory Visit: Payer: Self-pay | Admitting: Nurse Practitioner

## 2014-10-24 ENCOUNTER — Other Ambulatory Visit: Payer: Self-pay | Admitting: Nurse Practitioner

## 2014-11-08 ENCOUNTER — Ambulatory Visit (INDEPENDENT_AMBULATORY_CARE_PROVIDER_SITE_OTHER): Payer: Medicare Other

## 2014-11-08 ENCOUNTER — Encounter: Payer: Self-pay | Admitting: Nurse Practitioner

## 2014-11-08 ENCOUNTER — Ambulatory Visit (INDEPENDENT_AMBULATORY_CARE_PROVIDER_SITE_OTHER): Payer: Medicare Other | Admitting: Nurse Practitioner

## 2014-11-08 VITALS — BP 154/87 | HR 93 | Temp 97.6°F | Ht 62.0 in | Wt 198.0 lb

## 2014-11-08 DIAGNOSIS — R079 Chest pain, unspecified: Secondary | ICD-10-CM | POA: Diagnosis not present

## 2014-11-08 DIAGNOSIS — E1142 Type 2 diabetes mellitus with diabetic polyneuropathy: Secondary | ICD-10-CM

## 2014-11-08 DIAGNOSIS — I10 Essential (primary) hypertension: Secondary | ICD-10-CM | POA: Diagnosis not present

## 2014-11-08 DIAGNOSIS — R0789 Other chest pain: Secondary | ICD-10-CM

## 2014-11-08 DIAGNOSIS — E785 Hyperlipidemia, unspecified: Secondary | ICD-10-CM | POA: Diagnosis not present

## 2014-11-08 LAB — POCT GLYCOSYLATED HEMOGLOBIN (HGB A1C): HEMOGLOBIN A1C: 13

## 2014-11-08 LAB — GLUCOSE, POCT (MANUAL RESULT ENTRY): POC GLUCOSE: 268 mg/dL — AB (ref 70–99)

## 2014-11-08 LAB — POCT UA - MICROALBUMIN: Microalbumin Ur, POC: 20 mg/L

## 2014-11-08 MED ORDER — DULOXETINE HCL 60 MG PO CPEP: 60.0000 mg | ORAL_CAPSULE | Freq: Two times a day (BID) | ORAL | Status: DC

## 2014-11-08 MED ORDER — CARVEDILOL 25 MG PO TABS: ORAL_TABLET | ORAL | Status: DC

## 2014-11-08 MED ORDER — BENAZEPRIL HCL 40 MG PO TABS: 40.0000 mg | ORAL_TABLET | Freq: Every day | ORAL | Status: DC

## 2014-11-08 MED ORDER — GLIPIZIDE-METFORMIN HCL 5-500 MG PO TABS: 1.0000 | ORAL_TABLET | Freq: Two times a day (BID) | ORAL | Status: DC

## 2014-11-08 MED ORDER — GABAPENTIN 300 MG PO CAPS: ORAL_CAPSULE | ORAL | Status: DC

## 2014-11-08 NOTE — Progress Notes (Signed)
Subjective:    Patient ID: Patricia Austin, female    DOB: [birth year], 69 y.o.   MRN: [id number]  Patient here today for follow up of chronic medical problems. Patient has not been compliant with her diabetic meds- some days she has not taken at all. C/o pain in sternal area- hurts when she presses on her xyphoid process.   Hypertension This is a chronic problem. The current episode started more than 1 year ago. The problem is resistant. Pertinent negatives include no chest pain, headaches, neck pain, palpitations or shortness of breath. Risk factors for coronary artery disease include diabetes mellitus, dyslipidemia, obesity, post-menopausal state and sedentary lifestyle. Past treatments include calcium channel blockers, beta blockers, diuretics and ACE inhibitors. The current treatment provides moderate improvement. Compliance problems include diet and exercise.  Hypertensive end-organ damage includes CAD/MI. There is no history of CVA.  Hyperlipidemia This is a chronic problem. The current episode started more than 1 year ago. Recent lipid tests were reviewed and are variable. Associated symptoms include myalgias. Pertinent negatives include no chest pain or shortness of breath. She is currently on no antihyperlipidemic treatment (statins make her hurt refuses to take). The current treatment provides mild improvement of lipids. Compliance problems include adherence to diet and adherence to exercise.  Risk factors for coronary artery disease include diabetes mellitus, dyslipidemia, hypertension, obesity and post-menopausal.  Diabetes She presents for her follow-up diabetic visit. She has type 2 diabetes mellitus. No MedicAlert identification noted. Her disease course has been fluctuating. Pertinent negatives for hypoglycemia include no headaches. Pertinent negatives for diabetes include no chest pain. There are no diabetic complications. Pertinent negatives for diabetic complications include no CVA. Risk  factors for coronary artery disease include diabetes mellitus, dyslipidemia, hypertension, obesity, post-menopausal and sedentary lifestyle. Current diabetic treatment includes oral agent (dual therapy). She is compliant with treatment some of the time. Her weight is stable. When asked about meal planning, she reported none. She has not had a previous visit with a dietitian. She never participates in exercise. Her breakfast blood glucose is taken between 9-10 am. Her breakfast blood glucose range is generally 140-180 mg/dl. Her highest blood glucose is >200 mg/dl. Her overall blood glucose range is 140-180 mg/dl. An ACE inhibitor/angiotensin II receptor blocker is being taken. She does not see a podiatrist.Eye exam is not current.  Fibromyalgia/periperal neuropathy Cymbalta working ok but would like to increase dose because still has achiness all over- she also takes neurotin which helps some. Neurotin helping some GERD Currently not taking anything- has occasional symptoms depending on what she eats  Review of Systems  Constitutional: Negative.   Eyes: Negative.   Respiratory: Negative.  Negative for shortness of breath.   Cardiovascular: Negative for chest pain and palpitations.  Genitourinary: Negative.   Musculoskeletal: Positive for myalgias, back pain and arthralgias. Negative for neck pain.  Neurological: Negative for headaches.       Objective:   Physical Exam  Constitutional: She is oriented to person, place, and time. She appears well-developed and well-nourished.  HENT:  Nose: Nose normal.  Mouth/Throat: Oropharynx is clear and moist.  Eyes: EOM are normal.  Neck: Trachea normal, normal range of motion and full passive range of motion without pain. Neck supple. No JVD present. Carotid bruit is not present. No thyromegaly present.  Cardiovascular: Normal rate, regular rhythm, normal heart sounds and intact distal pulses.  Exam reveals no gallop and no friction rub.   No murmur  heard. Pulmonary/Chest: Effort normal and breath sounds  normal.  Abdominal: Soft. Bowel sounds are normal. She exhibits no distension and no mass. There is no tenderness.  Musculoskeletal: Normal range of motion.  Lymphadenopathy:    She has no cervical adenopathy.  Neurological: She is alert and oriented to person, place, and time. She has normal reflexes.  Skin: Skin is warm and dry.  Scalp scar tender on right- no edema- no discoloration  Psychiatric: She has a normal mood and affect. Her behavior is normal. Judgment and thought content normal.   BP 154/87 mmHg  Pulse 93  Temp(Src) 97.6 F (36.4 C) (Oral)  Ht _0  (1.575 m)  Wt 198 lb (89.812 kg)  BMI 36.21 kg/m2  Results for orders placed or performed in visit on 11/08/14  POCT glycosylated hemoglobin (Hb A1C)  Result Value Ref Range   Hemoglobin A1C 13.0     Chest xray- no abmormal findsings- unchanged from previous-Preliminary reading by Ronnald Collum, FNP  [facility] [place]        Assessment & Plan:   1. Hyperlipidemia with target LDL less than 100 Low fta diet - NMR, lipoprofile  2. Essential hypertension Do not add salt to diet - CMP14+EGFR  3. Type 2 diabetes mellitus with diabetic polyneuropathy Need to get back on meds  Including bydureon Keep diary of blood sugars Want to see patient in 1 month to see if blood sugars are coming doen Patient refuses to go on insulin - POCT glycosylated hemoglobin (Hb A1C) - POCT UA - Microalbumin  4. Sternal pain tyenol OTC - DG Chest 2 View; Future  Meds ordered this encounter  Medications  . benazepril (LOTENSIN) 40 MG tablet    Sig: Take 1 tablet (40 mg total) by mouth daily before breakfast.    Dispense:  90 tablet    Refill:  0    Order Specific Question:  Supervising Provider    Answer:  Chipper Herb [[id number]]  . carvedilol (COREG) 25 MG tablet    Sig: TAKE  (1)  TABLET TWICE A DAY WITH MEALS (BREAKFAST AND SUPPER)    Dispense:  180 tablet    Refill:  0    Order  Specific Question:  Supervising Provider    Answer:  Chipper Herb [[id number]]  . gabapentin (NEURONTIN) 300 MG capsule    Sig: TAKE (1) CAPSULE TWICE DAILY.    Dispense:  60 capsule    Refill:  4    Order Specific Question:  Supervising Provider    Answer:  Chipper Herb [[id number]]  . glipiZIDE-metformin (METAGLIP) 5-500 MG per tablet    Sig: Take 1 tablet by mouth 2 (two) times daily before a meal.    Dispense:  180 tablet    Refill:  1    Order Specific Question:  Supervising Provider    Answer:  Chipper Herb [[id number]]  . DULoxetine (CYMBALTA) 60 MG capsule    Sig: Take 1 capsule (60 mg total) by mouth 2 (two) times daily.    Dispense:  180 capsule    Refill:  0    Order Specific Question:  Supervising Provider    Answer:  Chipper Herb Mack pending Health maintenance reviewed Diet and exercise encouraged Continue all meds Follow up  In 1 month   [place], FNP

## 2014-11-08 NOTE — Patient Instructions (Signed)

## 2014-11-08 NOTE — Addendum Note (Signed)
Addended by: Pollyann Kennedy F on: 11/08/2014 12:26 PM   Modules accepted: Orders

## 2014-11-08 NOTE — Addendum Note (Signed)
Addended by: Pollyann Kennedy F on: 11/08/2014 12:25 PM   Modules accepted: Orders

## 2014-11-09 LAB — CMP14+EGFR
A/G RATIO: 1.5 (ref 1.1–2.5)
ALBUMIN: 3.9 g/dL (ref 3.5–4.8)
ALT: 26 IU/L (ref 0–32)
AST: 26 IU/L (ref 0–40)
Alkaline Phosphatase: 121 IU/L — ABNORMAL HIGH (ref 39–117)
BUN/Creatinine Ratio: 12 (ref 11–26)
BUN: 13 mg/dL (ref 8–27)
Bilirubin Total: 0.3 mg/dL (ref 0.0–1.2)
CALCIUM: 10 mg/dL (ref 8.7–10.3)
CO2: 26 mmol/L (ref 18–29)
Chloride: 101 mmol/L (ref 97–108)
Creatinine, Ser: 1.07 mg/dL — ABNORMAL HIGH (ref 0.57–1.00)
GFR calc Af Amer: 61 mL/min/{1.73_m2} (ref >59)
GFR, EST NON AFRICAN AMERICAN: 53 mL/min/{1.73_m2} — AB (ref >59)
GLUCOSE: 277 mg/dL — AB (ref 65–99)
Globulin, Total: 2.6 g/dL (ref 1.5–4.5)
Potassium: 5.3 mmol/L — ABNORMAL HIGH (ref 3.5–5.2)
Sodium: 141 mmol/L (ref 134–144)
TOTAL PROTEIN: 6.5 g/dL (ref 6.0–8.5)

## 2014-11-09 LAB — NMR, LIPOPROFILE
Cholesterol: 190 mg/dL (ref 100–199)
HDL CHOLESTEROL BY NMR: 50 mg/dL (ref >39)
HDL Particle Number: 29 umol/L — ABNORMAL LOW (ref >=30.5)
LDL Particle Number: 946 nmol/L (ref <1000)
LDL Size: 21.6 nm (ref >20.5)
LDL-C: 88 mg/dL (ref 0–99)
LP-IR Score: 42 (ref <=45)
SMALL LDL PARTICLE NUMBER: 289 nmol/L (ref <=527)
Triglycerides by NMR: 259 mg/dL — ABNORMAL HIGH (ref 0–149)

## 2014-11-09 LAB — MICROALBUMIN, URINE: Microalbumin, Urine: 47.3 ug/mL

## 2014-12-22 ENCOUNTER — Ambulatory Visit: Payer: Medicare Other | Admitting: Nurse Practitioner

## 2015-01-03 ENCOUNTER — Other Ambulatory Visit: Payer: Self-pay | Admitting: Nurse Practitioner

## 2015-01-05 DIAGNOSIS — M25562 Pain in left knee: Secondary | ICD-10-CM | POA: Diagnosis not present

## 2015-01-16 ENCOUNTER — Other Ambulatory Visit: Payer: Self-pay | Admitting: Nurse Practitioner

## 2015-01-16 DIAGNOSIS — Z1231 Encounter for screening mammogram for malignant neoplasm of breast: Secondary | ICD-10-CM

## 2015-01-25 ENCOUNTER — Ambulatory Visit (HOSPITAL_COMMUNITY)
Admission: RE | Admit: 2015-01-25 | Discharge: 2015-01-25 | Disposition: A | Discharge status: Home / Self Care | Payer: Medicare Other | Source: Ambulatory Visit | Attending: Nurse Practitioner | Admitting: Nurse Practitioner

## 2015-01-25 DIAGNOSIS — Z1231 Encounter for screening mammogram for malignant neoplasm of breast: Secondary | ICD-10-CM | POA: Diagnosis not present

## 2015-01-30 ENCOUNTER — Encounter: Payer: Self-pay | Admitting: Nurse Practitioner

## 2015-01-30 ENCOUNTER — Ambulatory Visit (INDEPENDENT_AMBULATORY_CARE_PROVIDER_SITE_OTHER): Payer: Medicare Other | Admitting: Nurse Practitioner

## 2015-01-30 VITALS — BP 138/81 | HR 86 | Temp 98.4°F | Ht 62.0 in | Wt 194.0 lb

## 2015-01-30 DIAGNOSIS — I159 Secondary hypertension, unspecified: Secondary | ICD-10-CM | POA: Diagnosis not present

## 2015-01-30 DIAGNOSIS — K219 Gastro-esophageal reflux disease without esophagitis: Secondary | ICD-10-CM | POA: Diagnosis not present

## 2015-01-30 DIAGNOSIS — E785 Hyperlipidemia, unspecified: Secondary | ICD-10-CM | POA: Diagnosis not present

## 2015-01-30 DIAGNOSIS — M1712 Unilateral primary osteoarthritis, left knee: Secondary | ICD-10-CM | POA: Diagnosis not present

## 2015-01-30 DIAGNOSIS — E1142 Type 2 diabetes mellitus with diabetic polyneuropathy: Secondary | ICD-10-CM | POA: Diagnosis not present

## 2015-01-30 LAB — GLUCOSE, POCT (MANUAL RESULT ENTRY): POC Glucose: 354 mg/dl — AB (ref 70–99)

## 2015-01-30 LAB — POCT GLYCOSYLATED HEMOGLOBIN (HGB A1C): HEMOGLOBIN A1C: 13.7

## 2015-01-30 MED ORDER — CARVEDILOL 25 MG PO TABS: ORAL_TABLET | ORAL | Status: DC

## 2015-01-30 MED ORDER — GLIPIZIDE-METFORMIN HCL 5-500 MG PO TABS: 1.0000 | ORAL_TABLET | Freq: Two times a day (BID) | ORAL | Status: DC

## 2015-01-30 MED ORDER — DULOXETINE HCL 60 MG PO CPEP
60.0000 mg | ORAL_CAPSULE | Freq: Two times a day (BID) | ORAL | Status: DC
Start: 2015-01-30 — End: 2015-05-31

## 2015-01-30 MED ORDER — AMLODIPINE BESYLATE 5 MG PO TABS: 5.0000 mg | ORAL_TABLET | Freq: Every day | ORAL | Status: DC

## 2015-01-30 MED ORDER — GLUCOSE BLOOD VI STRP: ORAL_STRIP | Status: DC

## 2015-01-30 MED ORDER — ONETOUCH DELICA LANCING DEV MISC: 1.0000 | Freq: Two times a day (BID) | Status: DC

## 2015-01-30 MED ORDER — FUROSEMIDE 20 MG PO TABS: 20.0000 mg | ORAL_TABLET | Freq: Every day | ORAL | Status: DC

## 2015-01-30 MED ORDER — INSULIN GLARGINE 300 UNIT/ML ~~LOC~~ SOPN: 30.0000 [IU] | PEN_INJECTOR | Freq: Every day | SUBCUTANEOUS | Status: DC

## 2015-01-30 MED ORDER — GABAPENTIN 300 MG PO CAPS: ORAL_CAPSULE | ORAL | Status: DC

## 2015-01-30 MED ORDER — BENAZEPRIL HCL 40 MG PO TABS: 40.0000 mg | ORAL_TABLET | Freq: Every day | ORAL | Status: DC

## 2015-01-30 MED ORDER — INSULIN PEN NEEDLE 32G X 4 MM MISC: 1.0000 | Freq: Every day | Status: DC

## 2015-01-30 NOTE — Progress Notes (Signed)
Subjective:    Patient ID: Patricia Austin, female    DOB: 10/01/1945, 69 y.o.   MRN: [id number]  Patient here today for follow up of chronic medical problems. Patient really struggling with her diabetes. SHe cannot seem to watch her diet and forgets to take her meds half of the time. Patient sent me a note a few weeks ago wanting to start on injsctable but thought would wait until appointment to discuss.  Hypertension This is a chronic problem. The current episode started more than 1 year ago. The problem is resistant. Pertinent negatives include no chest pain, headaches, neck pain, palpitations or shortness of breath. Risk factors for coronary artery disease include diabetes mellitus, dyslipidemia, obesity, post-menopausal state and sedentary lifestyle. Past treatments include calcium channel blockers, beta blockers, diuretics and ACE inhibitors. The current treatment provides moderate improvement. Compliance problems include diet and exercise.  Hypertensive end-organ damage includes CAD/MI. There is no history of CVA.  Hyperlipidemia This is a chronic problem. The current episode started more than 1 year ago. Recent lipid tests were reviewed and are variable. Associated symptoms include myalgias. Pertinent negatives include no chest pain or shortness of breath. She is currently on no antihyperlipidemic treatment (statins make her hurt refuses to take). The current treatment provides mild improvement of lipids. Compliance problems include adherence to diet and adherence to exercise.  Risk factors for coronary artery disease include diabetes mellitus, dyslipidemia, hypertension, obesity and post-menopausal.  Diabetes She presents for her follow-up diabetic visit. She has type 2 diabetes mellitus. No MedicAlert identification noted. Her disease course has been fluctuating. Pertinent negatives for hypoglycemia include no headaches. Pertinent negatives for diabetes include no chest pain. There are no diabetic  complications. Pertinent negatives for diabetic complications include no CVA. Risk factors for coronary artery disease include diabetes mellitus, dyslipidemia, hypertension, obesity, post-menopausal and sedentary lifestyle. Current diabetic treatment includes oral agent (dual therapy). She is compliant with treatment some of the time. Her weight is stable. When asked about meal planning, she reported none. She has not had a previous visit with a dietitian. She never participates in exercise. Her breakfast blood glucose is taken between 9-10 am. Her breakfast blood glucose range is generally 140-180 mg/dl. Her highest blood glucose is >200 mg/dl. Her overall blood glucose range is 140-180 mg/dl. An ACE inhibitor/angiotensin II receptor blocker is being taken. She does not see a podiatrist.Eye exam is not current.  Fibromyalgia/periperal neuropathy Cymbalta working ok but would like to increase dose because still has achiness all over- she also takes neurotin which helps some. Neurotin helping some GERD Currently not taking anything- has occasional symptoms depending on what she eats  Review of Systems  Constitutional: Negative.   Eyes: Negative.   Respiratory: Negative.  Negative for shortness of breath.   Cardiovascular: Negative for chest pain and palpitations.  Genitourinary: Negative.   Musculoskeletal: Positive for myalgias, back pain and arthralgias. Negative for neck pain.  Neurological: Negative for headaches.       Objective:   Physical Exam  Constitutional: She is oriented to person, place, and time. She appears well-developed and well-nourished.  HENT:  Nose: Nose normal.  Mouth/Throat: Oropharynx is clear and moist.  Eyes: EOM are normal.  Neck: Trachea normal, normal range of motion and full passive range of motion without pain. Neck supple. No JVD present. Carotid bruit is not present. No thyromegaly present.  Cardiovascular: Normal rate, regular rhythm, normal heart sounds and  intact distal pulses.  Exam reveals no gallop and  no friction rub.   No murmur heard. Pulmonary/Chest: Effort normal and breath sounds normal.  Abdominal: Soft. Bowel sounds are normal. She exhibits no distension and no mass. There is no tenderness.  Musculoskeletal: Normal range of motion.  Lymphadenopathy:    She has no cervical adenopathy.  Neurological: She is alert and oriented to person, place, and time. She has normal reflexes.  Skin: Skin is warm and dry.  Scalp scar tender on right- no edema- no discoloration  Psychiatric: She has a normal mood and affect. Her behavior is normal. Judgment and thought content normal.     BP 138/81 mmHg  Pulse 86  Temp(Src) 98.4 F (36.9 C) (Oral)  Ht $R'5\' 2"'iB$  (1.575 m)  Wt 194 lb (87.998 kg)  BMI 35.47 kg/m2   Results for orders placed or performed in visit on 01/30/15  POCT glycosylated hemoglobin (Hb A1C)  Result Value Ref Range   Hemoglobin A1C 13.7         Assessment & Plan:   1. Secondary hypertension, unspecified Do not add salt to diet - CMP14+EGFR - benazepril (LOTENSIN) 40 MG tablet; Take 1 tablet (40 mg total) by mouth daily before breakfast.  Dispense: 90 tablet; Refill: 0 - carvedilol (COREG) 25 MG tablet; TAKE  (1)  TABLET TWICE A DAY WITH MEALS (BREAKFAST AND SUPPER)  Dispense: 180 tablet; Refill: 0 - amLODipine (NORVASC) 5 MG tablet; Take 1 tablet (5 mg total) by mouth daily.  Dispense: 30 tablet; Refill: 3 - furosemide (LASIX) 20 MG tablet; Take 1 tablet (20 mg total) by mouth daily.  Dispense: 30 tablet; Refill: 4  2. Gastroesophageal reflux disease without esophagitis Avoid spicy foods Do not eat 2 hours prior to bedtime  3. Type 2 diabetes mellitus with diabetic polyneuropathy STRICT carb counting Started on toujeo injections- patient understands how to use - POCT glycosylated hemoglobin (Hb A1C) - gabapentin (NEURONTIN) 300 MG capsule; TAKE (1) CAPSULE TWICE DAILY.  Dispense: 60 capsule; Refill: 4 -  glipiZIDE-metformin (METAGLIP) 5-500 MG per tablet; Take 1 tablet by mouth 2 (two) times daily before a meal.  Dispense: 180 tablet; Refill: 1 - DULoxetine (CYMBALTA) 60 MG capsule; Take 1 capsule (60 mg total) by mouth 2 (two) times daily.  Dispense: 180 capsule; Refill: 0 - glucose blood (ONE TOUCH ULTRA TEST) test strip; Test BID and prn  E11.42  Dispense: 100 each; Refill: 12 - Lancet Devices (ONE TOUCH DELICA LANCING DEV) MISC; 1 each by Does not apply route 2 (two) times daily. Dx   E11.42  Dispense: 100 each; Refill: 5 - Insulin Glargine (TOUJEO SOLOSTAR) 300 UNIT/ML SOPN; Inject 30 Units into the skin daily.  Dispense: 3 mL; Refill: 0 - POCT glucose (manual entry)  4. Primary osteoarthritis of left knee Follow up with ortho  5. Morbid obesity Low fat diet and exercise  6. Hyperlipidemia with target LDL less than 100 Low fat diet - Lipid panel    Labs pending Health maintenance reviewed Diet and exercise encouraged Continue all meds Follow up  In 1 month   [place], FNP

## 2015-01-30 NOTE — Patient Instructions (Signed)
How and Where to Give Subcutaneous Insulin Injections, Adult People with type 1 diabetes must take insulin since their bodies do not make it. People with type 2 diabetes may require insulin. There are many different types of insulin as well as other injectable diabetes medicines that are meant to be injected into the fat layer under your skin. The type of insulin or injectable diabetes medicine you take may determine how many injections you give yourself and when to take the injections.  CHOOSING A SITE FOR INJECTION Insulin absorption varies from site to site. As with any injectable medication it is best for the insulin to be injected within the same body region. However, do not inject the insulin in the same spot each time. Rotating the spots you give your injections will prevent inflammation or tissue breakdown. There are four main regions that can be used for injections. The regions include the:  Abdomen (preferred region, especially for non-insulin injectable diabetes medicine).  Front and upper outer sides of thighs.  Back of upper arm.  Buttocks. USING A SYRINGE AND VIAL Drawing up insulin: single insulin dose 1. Wash your hands with soap and water. 2. Gently roll the insulin bottle (vial) between your hands to mix it. Do not shake the vial. 3. Clean the top rubber part of the vial with an alcohol wipe. Be sure that the plastic pop-top has been removed on newer vials. 4. Remove the plastic cover from the needle on the syringe. Do not let the needle touch anything. 5. Pull the plunger back to draw air into the syringe. The air should be the same amount as the insulin dose. 6. Push the needle through the rubber on the top of the vial. Do not turn the vial over. 7. Push the plunger in all the way to put the air into the vial. 8. Leave the needle in the vial and turn the vial and syringe upside down. 9. Pull down slowly on the plunger, drawing the amount of insulin you need into the  syringe. 10. Look for air bubbles in the syringe. You may need to push the plunger up and down 2 to 3 times to slowly get rid of any air bubbles in the syringe. 11. Pull back the plunger to get your correct dose. 12. Remove the needle from the vial. 13. Use an alcohol wipe to clean the area of the body to be injected. 14. Pinch up 1 inch of skin and hold it. 15. Put the needle straight into the skin (90-degree angle). Put the needle in as far as it will go (to the hub). The needle may need to be injected at a 45-degree angle in small adults with little fat. 16. When the needle is in, you can let go of your skin. 17. Push the plunger down all the way to inject the insulin. 18. Pull the needle straight out of the skin. 19. Press the alcohol wipe over the spot where you gave your injection. Keep it there for a few seconds. Do not rub the area. 20. Do not put the plastic cover back on the needle. Drawing up insulin: mixing 2 insulins 1. Wash your hands with soap and water. 2. Gently roll the vial of "cloudy" insulin between your hands or rotate the vial from top to bottom to mix. 3. Clean the top of both vials with an alcohol wipe. Be sure that the plastic pop-top lid has been removed on newer vials. 4. Pull air into the syringe to  equal the dose of "cloudy" insulin. 5. Stick the needle into the "cloudy" insulin vial and inject the air. Be sure to keep the vial upright. 6. Remove the needle from the "cloudy" insulin vial. 7. Pull air into the syringe to equal the dose of "clear" insulin. 8. Stick the needle into the "clear" insulin vial and inject the air. 9. Leave the needle in the "clear" insulin vial and turn the vial upside down. 10. Pull down on the plunger and slowly draw into the syringe the number of units of "clear" insulin desired. 11. Look for air bubbles in the syringe. You may need to push the plunger up and down 2 to 3 times to slowly get rid of any air bubbles in the  syringe. 12. Remove the needle from the "clear" insulin vial. 13. Stick the needle into the "cloudy" insulin vial. Do not inject any of the "clear" insulin into the "cloudy" vial. 14. Turn the "cloudy" vial upside down and pull the plunger down to the number of units that equals the total number of units of "clear" and "cloudy" insulins. 15. Remove the needle from the "cloudy" insulin vial. 16. Use an alcohol wipe to clean the area of the body to be injected. 17. Put the needle straight into the skin (90-degree angle). Put the needle in as far as it will go (to the hub). The needle may need to be injected at a 45-degree angle in small adults with little fat. 18. When the needle is in, you can let go of your skin. 19. Push the plunger down all the way to inject the insulin. 20. Pull the needle straight out of the skin. 21. Press the alcohol wipe over the spot where you gave your injection. Keep it there for a few seconds. Do not rub the area. 22. Do not put the plastic cover back on the needle. USING INSULIN PENS 1. Wash your hands with soap and water. 2. If you are using the "cloudy" insulin, roll the pen between your palms several times or rotate the pen top to bottom several times. 3. Remove the insulin pen cap. 4. Clean the rubber stopper of the cartridge with an alcohol wipe. 5. Remove the protective paper tab from the disposable needle. 6. Screw the needle onto the pen. 7. Remove the outer plastic needle cover. 8. Remove the inner plastic needle cover. 9. Prime the insulin pen by turning the button (dial) to 2 units. Hold the pen with the needle pointing up, and push the dial on the opposite end until a drop of insulin appears at the needle tip. If no insulin appears, repeat this step. 10. Dial the number of units of insulin you will inject. 11. Use an alcohol wipe to clean the area of the body to be injected. 12. Pinch up 1 inch of skin and hold it. 13. Put the needle straight into the  skin (90-degree angle). 14. Push the dial down to push the insulin into the fat tissue. 15. Count to 10 slowly. Then, remove the needle from the fat tissue. 16. Carefully replace the larger outer plastic needle cover over the needle and unscrew the capped needle. THROWING AWAY SUPPLIES  Discard used needles in a puncture proof sharps disposal container. Follow disposal regulations for the area where you live.  Vials and empty disposable pens may be thrown away in the regular trash. Document Released: 08/30/2003 Document Revised: 10/24/2013 Document Reviewed: 11/16/2012 [facility] Patient Information 2015 [place], [place]. This information is not  intended to replace advice given to you by your health care provider. Make sure you discuss any questions you have with your health care provider.

## 2015-01-31 LAB — CMP14+EGFR
ALK PHOS: 124 IU/L — AB (ref 39–117)
ALT: 28 IU/L (ref 0–32)
AST: 28 IU/L (ref 0–40)
Albumin/Globulin Ratio: 1.8 (ref 1.1–2.5)
Albumin: 4.3 g/dL (ref 3.5–4.8)
BUN/Creatinine Ratio: 15 (ref 11–26)
BUN: 18 mg/dL (ref 8–27)
Bilirubin Total: 0.3 mg/dL (ref 0.0–1.2)
CHLORIDE: 95 mmol/L — AB (ref 97–108)
CO2: 28 mmol/L (ref 18–29)
Calcium: 9.9 mg/dL (ref 8.7–10.3)
Creatinine, Ser: 1.19 mg/dL — ABNORMAL HIGH (ref 0.57–1.00)
GFR calc Af Amer: 53 mL/min/{1.73_m2} — ABNORMAL LOW (ref >59)
GFR calc non Af Amer: 46 mL/min/{1.73_m2} — ABNORMAL LOW (ref >59)
Globulin, Total: 2.4 g/dL (ref 1.5–4.5)
Glucose: 365 mg/dL — ABNORMAL HIGH (ref 65–99)
Potassium: 5 mmol/L (ref 3.5–5.2)
SODIUM: 138 mmol/L (ref 134–144)
Total Protein: 6.7 g/dL (ref 6.0–8.5)

## 2015-01-31 LAB — LIPID PANEL
CHOLESTEROL TOTAL: 217 mg/dL — AB (ref 100–199)
Chol/HDL Ratio: 3.8 ratio units (ref 0.0–4.4)
HDL: 57 mg/dL (ref >39)
LDL Calculated: 107 mg/dL — ABNORMAL HIGH (ref 0–99)
TRIGLYCERIDES: 263 mg/dL — AB (ref 0–149)
VLDL Cholesterol Cal: 53 mg/dL — ABNORMAL HIGH (ref 5–40)

## 2015-02-09 ENCOUNTER — Other Ambulatory Visit: Payer: Self-pay | Admitting: Nurse Practitioner

## 2015-02-28 ENCOUNTER — Telehealth: Payer: Self-pay | Admitting: Nurse Practitioner

## 2015-02-28 DIAGNOSIS — E1142 Type 2 diabetes mellitus with diabetic polyneuropathy: Secondary | ICD-10-CM

## 2015-02-28 MED ORDER — INSULIN GLARGINE 300 UNIT/ML ~~LOC~~ SOPN: 30.0000 [IU] | PEN_INJECTOR | Freq: Every day | SUBCUTANEOUS | Status: DC

## 2015-02-28 NOTE — Telephone Encounter (Signed)
Patient is out of toujeo and does not have any for tonight and tomorrow what do you recommend. Patient aware that we are out of toujeo samples

## 2015-02-28 NOTE — Telephone Encounter (Signed)
Samples sound great. Donnald Garre 'em if we got 'em!

## 2015-02-28 NOTE — Telephone Encounter (Signed)
We are out of samples 

## 2015-02-28 NOTE — Telephone Encounter (Signed)
Please send scrip

## 2015-02-28 NOTE — Telephone Encounter (Signed)
Patient aware and rx sent to pharmacy.  

## 2015-02-28 NOTE — Addendum Note (Signed)
Addended by: Thana Ates on: 02/28/2015 12:29 PM   Modules accepted: Orders

## 2015-03-02 ENCOUNTER — Encounter: Payer: Self-pay | Admitting: Nurse Practitioner

## 2015-03-02 ENCOUNTER — Ambulatory Visit (INDEPENDENT_AMBULATORY_CARE_PROVIDER_SITE_OTHER): Payer: Medicare Other | Admitting: Nurse Practitioner

## 2015-03-02 VITALS — BP 144/81 | HR 82 | Temp 97.6°F | Ht 62.0 in | Wt 201.0 lb

## 2015-03-02 DIAGNOSIS — E1142 Type 2 diabetes mellitus with diabetic polyneuropathy: Secondary | ICD-10-CM

## 2015-03-02 LAB — POCT GLYCOSYLATED HEMOGLOBIN (HGB A1C): Hemoglobin A1C: 11

## 2015-03-02 NOTE — Progress Notes (Signed)
   Subjective:    Patient ID: Patricia Austin, female    DOB: December 03, 1945, 69 y.o.   MRN: [id number]  HPI Patient in today for recheck of diabetes. SHe has been out of control for awhile with her blood sugars. Has been refusing to make medication changes. At last visit his hgba1c was 13.7 and blood sugar was over 300. We started her on toujeo along with er metaglip and diet change. Since starting toujeo her fasting blood sugars have been running below 120. SH ehas not checked her blood sugar at other times. SHe has not been taking all of her oral meds daily only taking at night instead of BID.    Review of Systems  Constitutional: Negative.   HENT: Negative.   Respiratory: Negative.   Cardiovascular: Negative.   Gastrointestinal: Negative.   Genitourinary: Negative.   Musculoskeletal: Negative.   Neurological: Negative.   Psychiatric/Behavioral: Negative.   All other systems reviewed and are negative.      Objective:   Physical Exam  Constitutional: She is oriented to person, place, and time. She appears well-developed and well-nourished.  Cardiovascular: Normal rate, regular rhythm and normal heart sounds.   Pulmonary/Chest: Effort normal and breath sounds normal.  Neurological: She is alert and oriented to person, place, and time.  Skin: Skin is warm and dry.  Psychiatric: She has a normal mood and affect. Her behavior is normal. Judgment and thought content normal.    BP 144/81 mmHg  Pulse 82  Temp(Src) 97.6 F (36.4 C) (Oral)  Ht 5\' 2"  (1.575 m)  Wt 201 lb (91.173 kg)  BMI 36.75 kg/m2    Results for orders placed or performed in visit on 03/02/15  POCT glycosylated hemoglobin (Hb A1C)  Result Value Ref Range   Hemoglobin A1C 11.0          Assessment & Plan:   1. Type 2 diabetes mellitus with diabetic polyneuropathy    Continue toujeo as rx at 30 u daily Take oral meds BID as rx Keep diary of blood sugar RTO in 2 months  [place], FNP

## 2015-03-02 NOTE — Patient Instructions (Signed)

## 2015-03-14 ENCOUNTER — Other Ambulatory Visit: Payer: Self-pay | Admitting: Nurse Practitioner

## 2015-04-24 DIAGNOSIS — M7541 Impingement syndrome of right shoulder: Secondary | ICD-10-CM | POA: Diagnosis not present

## 2015-05-02 ENCOUNTER — Other Ambulatory Visit: Payer: Self-pay | Admitting: Family Medicine

## 2015-05-02 ENCOUNTER — Ambulatory Visit (INDEPENDENT_AMBULATORY_CARE_PROVIDER_SITE_OTHER): Payer: Medicare Other

## 2015-05-02 DIAGNOSIS — Z23 Encounter for immunization: Secondary | ICD-10-CM | POA: Diagnosis not present

## 2015-05-08 DIAGNOSIS — M25571 Pain in right ankle and joints of right foot: Secondary | ICD-10-CM | POA: Diagnosis not present

## 2015-05-08 DIAGNOSIS — M25572 Pain in left ankle and joints of left foot: Secondary | ICD-10-CM | POA: Diagnosis not present

## 2015-05-15 DIAGNOSIS — M19072 Primary osteoarthritis, left ankle and foot: Secondary | ICD-10-CM | POA: Diagnosis not present

## 2015-05-15 DIAGNOSIS — M19071 Primary osteoarthritis, right ankle and foot: Secondary | ICD-10-CM | POA: Diagnosis not present

## 2015-05-31 ENCOUNTER — Encounter: Payer: Self-pay | Admitting: Nurse Practitioner

## 2015-05-31 ENCOUNTER — Ambulatory Visit (INDEPENDENT_AMBULATORY_CARE_PROVIDER_SITE_OTHER): Payer: Medicare Other | Admitting: Nurse Practitioner

## 2015-05-31 VITALS — BP 128/82 | HR 77 | Temp 97.1°F | Ht 62.0 in | Wt 207.0 lb

## 2015-05-31 DIAGNOSIS — E785 Hyperlipidemia, unspecified: Secondary | ICD-10-CM

## 2015-05-31 DIAGNOSIS — M170 Bilateral primary osteoarthritis of knee: Secondary | ICD-10-CM | POA: Diagnosis not present

## 2015-05-31 DIAGNOSIS — Z794 Long term (current) use of insulin: Secondary | ICD-10-CM | POA: Diagnosis not present

## 2015-05-31 DIAGNOSIS — I10 Essential (primary) hypertension: Secondary | ICD-10-CM

## 2015-05-31 DIAGNOSIS — I159 Secondary hypertension, unspecified: Secondary | ICD-10-CM | POA: Diagnosis not present

## 2015-05-31 DIAGNOSIS — E1142 Type 2 diabetes mellitus with diabetic polyneuropathy: Secondary | ICD-10-CM | POA: Diagnosis not present

## 2015-05-31 DIAGNOSIS — Z1212 Encounter for screening for malignant neoplasm of rectum: Secondary | ICD-10-CM

## 2015-05-31 LAB — POCT GLYCOSYLATED HEMOGLOBIN (HGB A1C): Hemoglobin A1C: 11

## 2015-05-31 LAB — GLUCOSE, POCT (MANUAL RESULT ENTRY): POC Glucose: 173 mg/dl — AB (ref 70–99)

## 2015-05-31 MED ORDER — GLIPIZIDE-METFORMIN HCL 5-500 MG PO TABS
1.0000 | ORAL_TABLET | Freq: Two times a day (BID) | ORAL | Status: DC
Start: 2015-05-31 — End: 2015-09-20

## 2015-05-31 MED ORDER — DULOXETINE HCL 60 MG PO CPEP
60.0000 mg | ORAL_CAPSULE | Freq: Two times a day (BID) | ORAL | Status: DC
Start: 2015-05-31 — End: 2015-09-20

## 2015-05-31 MED ORDER — FUROSEMIDE 20 MG PO TABS: 20.0000 mg | ORAL_TABLET | Freq: Every day | ORAL | Status: DC

## 2015-05-31 MED ORDER — AMLODIPINE BESYLATE 5 MG PO TABS: 5.0000 mg | ORAL_TABLET | Freq: Every day | ORAL | Status: DC

## 2015-05-31 MED ORDER — GABAPENTIN 300 MG PO CAPS: ORAL_CAPSULE | ORAL | Status: DC

## 2015-05-31 MED ORDER — CARVEDILOL 25 MG PO TABS: ORAL_TABLET | ORAL | Status: DC

## 2015-05-31 MED ORDER — DICLOFENAC SODIUM 75 MG PO TBEC: DELAYED_RELEASE_TABLET | ORAL | Status: DC

## 2015-05-31 MED ORDER — INSULIN GLARGINE 300 UNIT/ML ~~LOC~~ SOPN: 40.0000 [IU] | PEN_INJECTOR | Freq: Every day | SUBCUTANEOUS | Status: DC

## 2015-05-31 MED ORDER — BENAZEPRIL HCL 40 MG PO TABS: 40.0000 mg | ORAL_TABLET | Freq: Every day | ORAL | Status: DC

## 2015-05-31 NOTE — Patient Instructions (Signed)
Health Maintenance, Female Adopting a healthy lifestyle and getting preventive care can go a long way to promote health and wellness. Talk with your health care provider about what schedule of regular examinations is right for you. This is a good chance for you to check in with your provider about disease prevention and staying healthy. In between checkups, there are plenty of things you can do on your own. Experts have done a lot of research about which lifestyle changes and preventive measures are most likely to keep you healthy. Ask your health care provider for more information. WEIGHT AND DIET  Eat a healthy diet  Be sure to include plenty of vegetables, fruits, low-fat dairy products, and lean protein.  Do not eat a lot of foods high in solid fats, added sugars, or salt.  Get regular exercise. This is one of the most important things you can do for your health.  Most adults should exercise for at least 150 minutes each week. The exercise should increase your heart rate and make you sweat (moderate-intensity exercise).  Most adults should also do strengthening exercises at least twice a week. This is in addition to the moderate-intensity exercise.  Maintain a healthy weight  Body mass index (BMI) is a measurement that can be used to identify possible weight problems. It estimates body fat based on height and weight. Your health care provider can help determine your BMI and help you achieve or maintain a healthy weight.  For females 20 years of age and older:   A BMI below 18.5 is considered underweight.  A BMI of 18.5 to 24.9 is normal.  A BMI of 25 to 29.9 is considered overweight.  A BMI of 30 and above is considered obese.  Watch levels of cholesterol and blood lipids  You should start having your blood tested for lipids and cholesterol at 71 years of age, then have this test every 5 years.  You may need to have your cholesterol levels checked more often if:  Your lipid  or cholesterol levels are high.  You are older than 71 years of age.  You are at high risk for heart disease.  CANCER SCREENING   Lung Cancer  Lung cancer screening is recommended for adults 55-80 years old who are at high risk for lung cancer because of a history of smoking.  A yearly low-dose CT scan of the lungs is recommended for people who:  Currently smoke.  Have quit within the past 15 years.  Have at least a 30-pack-year history of smoking. A pack year is smoking an average of one pack of cigarettes a day for 1 year.  Yearly screening should continue until it has been 15 years since you quit.  Yearly screening should stop if you develop a health problem that would prevent you from having lung cancer treatment.  Breast Cancer  Practice breast self-awareness. This means understanding how your breasts normally appear and feel.  It also means doing regular breast self-exams. Let your health care provider know about any changes, no matter how small.  If you are in your 20s or 30s, you should have a clinical breast exam (CBE) by a health care provider every 1-3 years as part of a regular health exam.  If you are 40 or older, have a CBE every year. Also consider having a breast X-ray (mammogram) every year.  If you have a family history of breast cancer, talk to your health care provider about genetic screening.  If you   are at high risk for breast cancer, talk to your health care provider about having an MRI and a mammogram every year.  Breast cancer gene (BRCA) assessment is recommended for women who have family members with BRCA-related cancers. BRCA-related cancers include:  Breast.  Ovarian.  Tubal.  Peritoneal cancers.  Results of the assessment will determine the need for genetic counseling and BRCA1 and BRCA2 testing. Cervical Cancer Your health care provider may recommend that you be screened regularly for cancer of the pelvic organs (ovaries, uterus, and  vagina). This screening involves a pelvic examination, including checking for microscopic changes to the surface of your cervix (Pap test). You may be encouraged to have this screening done every 3 years, beginning at age 21.  For women ages 30-65, health care providers may recommend pelvic exams and Pap testing every 3 years, or they may recommend the Pap and pelvic exam, combined with testing for human papilloma virus (HPV), every 5 years. Some types of HPV increase your risk of cervical cancer. Testing for HPV may also be done on women of any age with unclear Pap test results.  Other health care providers may not recommend any screening for nonpregnant women who are considered low risk for pelvic cancer and who do not have symptoms. Ask your health care provider if a screening pelvic exam is right for you.  If you have had past treatment for cervical cancer or a condition that could lead to cancer, you need Pap tests and screening for cancer for at least 20 years after your treatment. If Pap tests have been discontinued, your risk factors (such as having a new sexual partner) need to be reassessed to determine if screening should resume. Some women have medical problems that increase the chance of getting cervical cancer. In these cases, your health care provider may recommend more frequent screening and Pap tests. Colorectal Cancer  This type of cancer can be detected and often prevented.  Routine colorectal cancer screening usually begins at 71 years of age and continues through 71 years of age.  Your health care provider may recommend screening at an earlier age if you have risk factors for colon cancer.  Your health care provider may also recommend using home test kits to check for hidden blood in the stool.  A small camera at the end of a tube can be used to examine your colon directly (sigmoidoscopy or colonoscopy). This is done to check for the earliest forms of colorectal  cancer.  Routine screening usually begins at age 50.  Direct examination of the colon should be repeated every 5-10 years through 71 years of age. However, you may need to be screened more often if early forms of precancerous polyps or small growths are found. Skin Cancer  Check your skin from head to toe regularly.  Tell your health care provider about any new moles or changes in moles, especially if there is a change in a mole's shape or color.  Also tell your health care provider if you have a mole that is larger than the size of a pencil eraser.  Always use sunscreen. Apply sunscreen liberally and repeatedly throughout the day.  Protect yourself by wearing long sleeves, pants, a wide-brimmed hat, and sunglasses whenever you are outside. HEART DISEASE, DIABETES, AND HIGH BLOOD PRESSURE   High blood pressure causes heart disease and increases the risk of stroke. High blood pressure is more likely to develop in:  People who have blood pressure in the high end   of the normal range (130-139/85-89 mm Hg).  People who are overweight or obese.  People who are African American.  If you are 38-23 years of age, have your blood pressure checked every 3-5 years. If you are 61 years of age or older, have your blood pressure checked every year. You should have your blood pressure measured twice--once when you are at a hospital or clinic, and once when you are not at a hospital or clinic. Record the average of the two measurements. To check your blood pressure when you are not at a hospital or clinic, you can use:  An automated blood pressure machine at a pharmacy.  A home blood pressure monitor.  If you are between 45 years and 39 years old, ask your health care provider if you should take aspirin to prevent strokes.  Have regular diabetes screenings. This involves taking a blood sample to check your fasting blood sugar level.  If you are at a normal weight and have a low risk for diabetes,  have this test once every three years after 71 years of age.  If you are overweight and have a high risk for diabetes, consider being tested at a younger age or more often. PREVENTING INFECTION  Hepatitis B  If you have a higher risk for hepatitis B, you should be screened for this virus. You are considered at high risk for hepatitis B if:  You were born in a country where hepatitis B is common. Ask your health care provider which countries are considered high risk.  Your parents were born in a high-risk country, and you have not been immunized against hepatitis B (hepatitis B vaccine).  You have HIV or AIDS.  You use needles to inject street drugs.  You live with someone who has hepatitis B.  You have had sex with someone who has hepatitis B.  You get hemodialysis treatment.  You take certain medicines for conditions, including cancer, organ transplantation, and autoimmune conditions. Hepatitis C  Blood testing is recommended for:  Everyone born from [birth year] through 1967.  Anyone with known risk factors for hepatitis C. Sexually transmitted infections (STIs)  You should be screened for sexually transmitted infections (STIs) including gonorrhea and chlamydia if:  You are sexually active and are younger than 71 years of age.  You are older than 71 years of age and your health care provider tells you that you are at risk for this type of infection.  Your sexual activity has changed since you were last screened and you are at an increased risk for chlamydia or gonorrhea. Ask your health care provider if you are at risk.  If you do not have HIV, but are at risk, it may be recommended that you take a prescription medicine daily to prevent HIV infection. This is called pre-exposure prophylaxis (PrEP). You are considered at risk if:  You are sexually active and do not regularly use condoms or know the HIV status of your partner(s).  You take drugs by injection.  You are sexually  active with a partner who has HIV. Talk with your health care provider about whether you are at high risk of being infected with HIV. If you choose to begin PrEP, you should first be tested for HIV. You should then be tested every 3 months for as long as you are taking PrEP.  PREGNANCY   If you are premenopausal and you may become pregnant, ask your health care provider about preconception counseling.  If you may  become pregnant, take 400 to 800 micrograms (mcg) of folic acid every day.  If you want to prevent pregnancy, talk to your health care provider about birth control (contraception). OSTEOPOROSIS AND MENOPAUSE   Osteoporosis is a disease in which the bones lose minerals and strength with aging. This can result in serious bone fractures. Your risk for osteoporosis can be identified using a bone density scan.  If you are 61 years of age or older, or if you are at risk for osteoporosis and fractures, ask your health care provider if you should be screened.  Ask your health care provider whether you should take a calcium or vitamin D supplement to lower your risk for osteoporosis.  Menopause may have certain physical symptoms and risks.  Hormone replacement therapy may reduce some of these symptoms and risks. Talk to your health care provider about whether hormone replacement therapy is right for you.  HOME CARE INSTRUCTIONS   Schedule regular health, dental, and eye exams.  Stay current with your immunizations.   Do not use any tobacco products including cigarettes, chewing tobacco, or electronic cigarettes.  If you are pregnant, do not drink alcohol.  If you are breastfeeding, limit how much and how often you drink alcohol.  Limit alcohol intake to no more than 1 drink per day for nonpregnant women. One drink equals 12 ounces of beer, 5 ounces of wine, or 1 ounces of hard liquor.  Do not use street drugs.  Do not share needles.  Ask your health care provider for help if  you need support or information about quitting drugs.  Tell your health care provider if you often feel depressed.  Tell your health care provider if you have ever been abused or do not feel safe at home.   This information is not intended to replace advice given to you by your health care provider. Make sure you discuss any questions you have with your health care provider.   Document Released: 12/23/2010 Document Revised: 06/30/2014 Document Reviewed: 05/11/2013 Elsevier Interactive Patient Education [employer].

## 2015-05-31 NOTE — Progress Notes (Addendum)
Subjective:    Patient ID: Patricia Austin, female    DOB: 03-11-1946, 69 y.o.   MRN: [id number]  Patient here today for follow up of chronic medical problems. Patient has not been compliant with her diabetic meds- some days she has not taken at all. C/o pain in sternal area- hurts when she presses on her xyphoid process.   Hypertension This is a chronic problem. The current episode started more than 1 year ago. The problem is resistant. Pertinent negatives include no chest pain, headaches, neck pain, palpitations or shortness of breath. Risk factors for coronary artery disease include diabetes mellitus, dyslipidemia, obesity, post-menopausal state and sedentary lifestyle. Past treatments include calcium channel blockers, beta blockers, diuretics and ACE inhibitors. The current treatment provides moderate improvement. Compliance problems include diet and exercise.  Hypertensive end-organ damage includes CAD/MI. There is no history of CVA.  Hyperlipidemia This is a chronic problem. The current episode started more than 1 year ago. Recent lipid tests were reviewed and are variable. Associated symptoms include myalgias. Pertinent negatives include no chest pain or shortness of breath. She is currently on no antihyperlipidemic treatment (statins make her hurt refuses to take). The current treatment provides mild improvement of lipids. Compliance problems include adherence to diet and adherence to exercise.  Risk factors for coronary artery disease include diabetes mellitus, dyslipidemia, hypertension, obesity and post-menopausal.  Diabetes She presents for her follow-up diabetic visit. She has type 2 diabetes mellitus. No MedicAlert identification noted. Her disease course has been fluctuating. Pertinent negatives for hypoglycemia include no headaches. Pertinent negatives for diabetes include no chest pain. There are no diabetic complications. Pertinent negatives for diabetic complications include no CVA. Risk  factors for coronary artery disease include diabetes mellitus, dyslipidemia, hypertension, obesity, post-menopausal and sedentary lifestyle. Current diabetic treatment includes oral agent (dual therapy). She is compliant with treatment some of the time. Her weight is stable. When asked about meal planning, she reported none. She has not had a previous visit with a dietitian. She never participates in exercise. Her breakfast blood glucose is taken between 9-10 am. Her breakfast blood glucose range is generally 140-180 mg/dl. Her highest blood glucose is >200 mg/dl. Her overall blood glucose range is 140-180 mg/dl. An ACE inhibitor/angiotensin II receptor blocker is being taken. She does not see a podiatrist.Eye exam is not current.  Fibromyalgia/periperal neuropathy Cymbalta working ok but would like to increase dose because still has achiness all over- she also takes neurotin which helps some. Neurotin helping some GERD Currently not taking anything- has occasional symptoms depending on what she eats  Review of Systems  Constitutional: Negative.   Eyes: Negative.   Respiratory: Negative.  Negative for shortness of breath.   Cardiovascular: Negative for chest pain and palpitations.  Genitourinary: Negative.   Musculoskeletal: Positive for myalgias, back pain and arthralgias. Negative for neck pain.  Neurological: Negative for headaches.       Objective:   Physical Exam  Constitutional: She is oriented to person, place, and time. She appears well-developed and well-nourished.  HENT:  Nose: Nose normal.  Mouth/Throat: Oropharynx is clear and moist.  Eyes: EOM are normal.  Neck: Trachea normal, normal range of motion and full passive range of motion without pain. Neck supple. No JVD present. Carotid bruit is not present. No thyromegaly present.  Cardiovascular: Normal rate, regular rhythm, normal heart sounds and intact distal pulses.  Exam reveals no gallop and no friction rub.   No murmur  heard. Pulmonary/Chest: Effort normal and breath sounds  normal.  Abdominal: Soft. Bowel sounds are normal. She exhibits no distension and no mass. There is no tenderness.  Musculoskeletal: Normal range of motion.  Lymphadenopathy:    She has no cervical adenopathy.  Neurological: She is alert and oriented to person, place, and time. She has normal reflexes.  Skin: Skin is warm and dry.  Scalp scar tender on right- no edema- no discoloration  Psychiatric: She has a normal mood and affect. Her behavior is normal. Judgment and thought content normal.   BP 128/82 mmHg  Pulse 77  Temp(Src) 97.1 F (36.2 C) (Oral)  Ht $R'5\' 2"'uS$  (1.575 m)  Wt 207 lb (93.895 kg)  BMI 37.85 kg/m2  Results for orders placed or performed in visit on 05/31/15  POCT glycosylated hemoglobin (Hb A1C)  Result Value Ref Range   Hemoglobin A1C 11.0   POCT glucose (manual entry)  Result Value Ref Range   POC Glucose 173 (A) 70 - 99 mg/dl       Assessment & Plan:  1. Type 2 diabetes mellitus with diabetic polyneuropathy, without long-term current use of insulin (HCC) Do not go bearfooted - POCT glycosylated hemoglobin (Hb A1C) - POCT glucose (manual entry)  2. Hyperlipidemia with target LDL less than 100 Low fat diet - Lipid panel  3. Essential hypertension Do not add salt to diet - CMP14+EGFR - furosemide (LASIX) 20 MG tablet; Take 1 tablet (20 mg total) by mouth daily.  Dispense: 30 tablet; Refill: 4 - carvedilol (COREG) 25 MG tablet; TAKE  (1)  TABLET TWICE A DAY WITH MEALS (BREAKFAST AND SUPPER)  Dispense: 180 tablet; Refill: 0 - benazepril (LOTENSIN) 40 MG tablet; Take 1 tablet (40 mg total) by mouth daily before breakfast.  Dispense: 90 tablet; Refill: 0 - amLODipine (NORVASC) 5 MG tablet; Take 1 tablet (5 mg total) by mouth daily.  Dispense: 90 tablet; Refill: 1  4. Type 2 diabetes mellitus with diabetic polyneuropathy, with long-term current use of insulin (HCC) Carb counting Increased toujeo dose  from 35u for 3 days then 40u daily - glipiZIDE-metformin (METAGLIP) 5-500 MG tablet; Take 1 tablet by mouth 2 (two) times daily before a meal.  Dispense: 180 tablet; Refill: 1 - gabapentin (NEURONTIN) 300 MG capsule; TAKE (1) CAPSULE TWICE DAILY.  Dispense: 60 capsule; Refill: 4 - DULoxetine (CYMBALTA) 60 MG capsule; Take 1 capsule (60 mg total) by mouth 2 (two) times daily.  Dispense: 180 capsule; Refill: 0 - Insulin Glargine (TOUJEO SOLOSTAR) 300 UNIT/ML SOPN; Inject 40 Units into the skin daily.  Dispense: 4.5 mL; Refill: 0  5. Primary osteoarthritis of both knees - diclofenac (VOLTAREN) 75 MG EC tablet; TAKE (1) TABLET TWICE A DAY.  Dispense: 180 tablet; Refill: 1  6. Screening for malignant neoplasm of the rectum - Fecal occult blood, imunochemical; Future    Labs pending Health maintenance reviewed Diet and exercise encouraged Continue all meds Follow up  In 3 month   Ballard, FNP

## 2015-06-01 LAB — CMP14+EGFR
ALK PHOS: 117 IU/L (ref 39–117)
ALT: 30 IU/L (ref 0–32)
AST: 29 IU/L (ref 0–40)
Albumin/Globulin Ratio: 1.7 (ref 1.1–2.5)
Albumin: 3.8 g/dL (ref 3.5–4.8)
BILIRUBIN TOTAL: 0.2 mg/dL (ref 0.0–1.2)
BUN / CREAT RATIO: 13 (ref 11–26)
BUN: 13 mg/dL (ref 8–27)
CO2: 27 mmol/L (ref 18–29)
CREATININE: 0.97 mg/dL (ref 0.57–1.00)
Calcium: 9.6 mg/dL (ref 8.7–10.3)
Chloride: 101 mmol/L (ref 97–106)
GFR calc Af Amer: 68 mL/min/{1.73_m2} (ref >59)
GFR calc non Af Amer: 59 mL/min/{1.73_m2} — ABNORMAL LOW (ref >59)
GLUCOSE: 200 mg/dL — AB (ref 65–99)
Globulin, Total: 2.2 g/dL (ref 1.5–4.5)
Potassium: 5.2 mmol/L (ref 3.5–5.2)
Sodium: 141 mmol/L (ref 136–144)
Total Protein: 6 g/dL (ref 6.0–8.5)

## 2015-06-01 LAB — LIPID PANEL
CHOLESTEROL TOTAL: 192 mg/dL (ref 100–199)
Chol/HDL Ratio: 3 ratio units (ref 0.0–4.4)
HDL: 64 mg/dL (ref >39)
LDL CALC: 101 mg/dL — AB (ref 0–99)
Triglycerides: 137 mg/dL (ref 0–149)
VLDL CHOLESTEROL CAL: 27 mg/dL (ref 5–40)

## 2015-07-17 DIAGNOSIS — M25511 Pain in right shoulder: Secondary | ICD-10-CM | POA: Diagnosis not present

## 2015-07-23 DIAGNOSIS — M25511 Pain in right shoulder: Secondary | ICD-10-CM | POA: Diagnosis not present

## 2015-08-03 ENCOUNTER — Other Ambulatory Visit: Payer: Self-pay | Admitting: Nurse Practitioner

## 2015-08-07 DIAGNOSIS — M25511 Pain in right shoulder: Secondary | ICD-10-CM | POA: Diagnosis not present

## 2015-09-03 ENCOUNTER — Ambulatory Visit: Payer: Self-pay | Admitting: Nurse Practitioner

## 2015-09-06 ENCOUNTER — Other Ambulatory Visit: Payer: Self-pay | Admitting: Nurse Practitioner

## 2015-09-20 ENCOUNTER — Encounter: Payer: Self-pay | Admitting: Nurse Practitioner

## 2015-09-20 ENCOUNTER — Ambulatory Visit (INDEPENDENT_AMBULATORY_CARE_PROVIDER_SITE_OTHER): Payer: Medicare Other | Admitting: Nurse Practitioner

## 2015-09-20 DIAGNOSIS — Z23 Encounter for immunization: Secondary | ICD-10-CM | POA: Diagnosis not present

## 2015-09-20 DIAGNOSIS — Z1159 Encounter for screening for other viral diseases: Secondary | ICD-10-CM | POA: Diagnosis not present

## 2015-09-20 DIAGNOSIS — Z1212 Encounter for screening for malignant neoplasm of rectum: Secondary | ICD-10-CM

## 2015-09-20 DIAGNOSIS — I159 Secondary hypertension, unspecified: Secondary | ICD-10-CM

## 2015-09-20 DIAGNOSIS — I10 Essential (primary) hypertension: Secondary | ICD-10-CM | POA: Diagnosis not present

## 2015-09-20 DIAGNOSIS — Z794 Long term (current) use of insulin: Secondary | ICD-10-CM | POA: Diagnosis not present

## 2015-09-20 DIAGNOSIS — K219 Gastro-esophageal reflux disease without esophagitis: Secondary | ICD-10-CM | POA: Diagnosis not present

## 2015-09-20 DIAGNOSIS — M797 Fibromyalgia: Secondary | ICD-10-CM

## 2015-09-20 DIAGNOSIS — E785 Hyperlipidemia, unspecified: Secondary | ICD-10-CM

## 2015-09-20 DIAGNOSIS — E1142 Type 2 diabetes mellitus with diabetic polyneuropathy: Secondary | ICD-10-CM

## 2015-09-20 LAB — BAYER DCA HB A1C WAIVED: HB A1C: 12.2 % — AB (ref <7.0)

## 2015-09-20 MED ORDER — INSULIN GLARGINE 300 UNIT/ML ~~LOC~~ SOPN: 45.0000 [IU] | PEN_INJECTOR | Freq: Every day | SUBCUTANEOUS | Status: DC

## 2015-09-20 MED ORDER — CARVEDILOL 25 MG PO TABS: ORAL_TABLET | ORAL | Status: DC

## 2015-09-20 MED ORDER — BENAZEPRIL HCL 40 MG PO TABS: 40.0000 mg | ORAL_TABLET | Freq: Every day | ORAL | Status: DC

## 2015-09-20 MED ORDER — FUROSEMIDE 20 MG PO TABS: 20.0000 mg | ORAL_TABLET | Freq: Every day | ORAL | Status: DC

## 2015-09-20 MED ORDER — AMLODIPINE BESYLATE 5 MG PO TABS: 5.0000 mg | ORAL_TABLET | Freq: Every day | ORAL | Status: DC

## 2015-09-20 MED ORDER — GLIPIZIDE-METFORMIN HCL 5-500 MG PO TABS: 1.0000 | ORAL_TABLET | Freq: Two times a day (BID) | ORAL | Status: DC

## 2015-09-20 MED ORDER — DULOXETINE HCL 60 MG PO CPEP: 60.0000 mg | ORAL_CAPSULE | Freq: Two times a day (BID) | ORAL | Status: DC

## 2015-09-20 MED ORDER — GABAPENTIN 300 MG PO CAPS: ORAL_CAPSULE | ORAL | Status: DC

## 2015-09-20 NOTE — Addendum Note (Signed)
Addended by: Rolena Infante on: 09/20/2015 09:45 AM   Modules accepted: Orders

## 2015-09-20 NOTE — Patient Instructions (Signed)

## 2015-09-20 NOTE — Progress Notes (Signed)
Subjective:    Patient ID: Patricia Austin, female    DOB: 1946-06-03, 70 y.o.   MRN: [id number]  Patient here today for follow up of chronic medical problems.  Outpatient Encounter Prescriptions as of 09/20/2015  Medication Sig  . amLODipine (NORVASC) 5 MG tablet Take 1 tablet (5 mg total) by mouth daily.  Marland Kitchen aspirin 81 MG tablet Take 81 mg by mouth daily.  . benazepril (LOTENSIN) 40 MG tablet Take 1 tablet (40 mg total) by mouth daily before breakfast.  . carvedilol (COREG) 25 MG tablet TAKE  (1)  TABLET TWICE A DAY WITH MEALS (BREAKFAST AND SUPPER)  . Cholecalciferol (VITAMIN D3) 5000 UNITS CAPS Take 5,000 Units by mouth daily.  . diclofenac (VOLTAREN) 75 MG EC tablet TAKE (1) TABLET TWICE A DAY.  . DULoxetine (CYMBALTA) 60 MG capsule Take 1 capsule (60 mg total) by mouth 2 (two) times daily.  . furosemide (LASIX) 20 MG tablet Take 1 tablet (20 mg total) by mouth daily.  Marland Kitchen gabapentin (NEURONTIN) 300 MG capsule TAKE (1) CAPSULE TWICE DAILY.  Marland Kitchen glipiZIDE-metformin (METAGLIP) 5-500 MG tablet Take 1 tablet by mouth 2 (two) times daily before a meal.  . glucose blood (ONE TOUCH ULTRA TEST) test strip Test BID and prn  E11.42  . Insulin Pen Needle (INSUPEN PEN NEEDLES) 32G X 4 MM MISC 1 each by Does not apply route daily.  Elmore Guise Devices (ONE TOUCH DELICA LANCING DEV) MISC 1 each by Does not apply route 2 (two) times daily. Dx   N23.55  . oxyCODONE-acetaminophen (ROXICET) 5-325 MG per tablet Take 1-2 tablets by mouth every 4 (four) hours as needed.  Nelva Nay SOLOSTAR 300 UNIT/ML SOPN INJECT 40 UNITS SQ ONCE DAILY   No facility-administered encounter medications on file as of 09/20/2015.     Hypertension This is a chronic problem. The current episode started more than 1 year ago. The problem is resistant. Pertinent negatives include no chest pain, headaches, neck pain, palpitations or shortness of breath. Risk factors for coronary artery disease include diabetes mellitus, dyslipidemia, obesity,  post-menopausal state and sedentary lifestyle. Past treatments include calcium channel blockers, beta blockers, diuretics and ACE inhibitors. The current treatment provides moderate improvement. Compliance problems include diet and exercise.  Hypertensive end-organ damage includes CAD/MI. There is no history of CVA.  Hyperlipidemia This is a chronic problem. The current episode started more than 1 year ago. Recent lipid tests were reviewed and are variable. Associated symptoms include myalgias. Pertinent negatives include no chest pain or shortness of breath. She is currently on no antihyperlipidemic treatment (statins make her hurt refuses to take). The current treatment provides mild improvement of lipids. Compliance problems include adherence to diet and adherence to exercise.  Risk factors for coronary artery disease include diabetes mellitus, dyslipidemia, hypertension, obesity and post-menopausal.  Diabetes She presents for her follow-up diabetic visit. She has type 2 diabetes mellitus. No MedicAlert identification noted. Her disease course has been fluctuating. Pertinent negatives for hypoglycemia include no headaches. Pertinent negatives for diabetes include no chest pain. There are no diabetic complications. Pertinent negatives for diabetic complications include no CVA. Risk factors for coronary artery disease include diabetes mellitus, dyslipidemia, hypertension, obesity, post-menopausal and sedentary lifestyle. Current diabetic treatment includes oral agent (dual therapy) and insulin injections (patient very noncompliant with her meds- ). She is compliant with treatment some of the time. Her weight is stable. When asked about meal planning, she reported none. She has not had a previous visit with a dietitian.  She never participates in exercise. Her breakfast blood glucose is taken between 9-10 am. Her breakfast blood glucose range is generally 140-180 mg/dl. Her highest blood glucose is >200 mg/dl.  Her overall blood glucose range is 140-180 mg/dl. (Does not check blood sugars very often) An ACE inhibitor/angiotensin II receptor blocker is being taken. She does not see a podiatrist.Eye exam is not current.  Fibromyalgia/periperal neuropathy Cymbalta working ok but would like to increase dose because still has achiness all over- she also takes neurotin which helps some. Neurotin helping some GERD Currently not taking anything- has occasional symptoms depending on what she eats  Review of Systems  Constitutional: Negative.   Eyes: Negative.   Respiratory: Negative.  Negative for shortness of breath.   Cardiovascular: Negative for chest pain and palpitations.  Genitourinary: Negative.   Musculoskeletal: Positive for myalgias, back pain and arthralgias. Negative for neck pain.  Neurological: Negative for headaches.       Objective:   Physical Exam  Constitutional: She is oriented to person, place, and time. She appears well-developed and well-nourished.  HENT:  Nose: Nose normal.  Mouth/Throat: Oropharynx is clear and moist.  Eyes: EOM are normal.  Neck: Trachea normal, normal range of motion and full passive range of motion without pain. Neck supple. No JVD present. Carotid bruit is not present. No thyromegaly present.  Cardiovascular: Normal rate, regular rhythm, normal heart sounds and intact distal pulses.  Exam reveals no gallop and no friction rub.   No murmur heard. Pulmonary/Chest: Effort normal and breath sounds normal.  Abdominal: Soft. Bowel sounds are normal. She exhibits no distension and no mass. There is no tenderness.  Musculoskeletal: Normal range of motion.  Lymphadenopathy:    She has no cervical adenopathy.  Neurological: She is alert and oriented to person, place, and time. She has normal reflexes.  Skin: Skin is warm and dry.  Scalp scar tender on right- no edema- no discoloration  Psychiatric: She has a normal mood and affect. Her behavior is normal.  Judgment and thought content normal.   BP 139/82 mmHg  Pulse 75  Temp(Src) 98 F (36.7 C) (Oral)  Ht _0  (1.575 m)  Wt 209 lb (94.802 kg)  BMI 38.22 kg/m2  hgba1c- 12.2% increased from 11% in December 2016     Assessment & Plan:  1. Hyperlipidemia with target LDL less than 100 Low fat diet - Lipid panel  2. Essential hypertension Do not add salt o diet - CMP14+EGFR - furosemide (LASIX) 20 MG tablet; Take 1 tablet (20 mg total) by mouth daily.  Dispense: 30 tablet; Refill: 4 - benazepril (LOTENSIN) 40 MG tablet; Take 1 tablet (40 mg total) by mouth daily before breakfast.  Dispense: 90 tablet; Refill: 0 - amLODipine (NORVASC) 5 MG tablet; Take 1 tablet (5 mg total) by mouth daily.  Dispense: 90 tablet; Refill: 1 - carvedilol (COREG) 25 MG tablet; TAKE  (1)  TABLET TWICE A DAY WITH MEALS (BREAKFAST AND SUPPER)  Dispense: 180 tablet; Refill: 0  3. Need for hepatitis C screening test - Hepatitis C antibody  4. Screening for malignant neoplasm of the rectum - Fecal occult blood, imunochemical; Future  5. Gastroesophageal reflux disease without esophagitis Avoid spicy foods Do not eat 2 hours prior to bedtime  6. Morbid obesity, unspecified obesity type ([place]) Discussed diet and exercise for person with BMI >25 Will recheck weight in 3-6 months   7. Fibromyalgia Exercise!   8. Type 2 diabetes mellitus with diabetic polyneuropathy, with long-term  current use of insulin (HCC) Increased toujeo to 45u daily ENCOURAGED TO TAKE METFORMIN BID AS RX STRICT CARB COUNTING - Bayer DCA Hb A1c Waived - glipiZIDE-metformin (METAGLIP) 5-500 MG tablet; Take 1 tablet by mouth 2 (two) times daily before a meal.  Dispense: 180 tablet; Refill: 1 - DULoxetine (CYMBALTA) 60 MG capsule; Take 1 capsule (60 mg total) by mouth 2 (two) times daily.  Dispense: 180 capsule; Refill: 0 - gabapentin (NEURONTIN) 300 MG capsule; TAKE (1) CAPSULE TWICE DAILY.  Dispense: 60 capsule; Refill: 4 - Insulin  Glargine (TOUJEO SOLOSTAR) 300 UNIT/ML SOPN; Inject 45 Units into the skin daily.  Dispense: 4.5 mL; Refill: 0    Labs pending Health maintenance reviewed Diet and exercise encouraged Continue all meds Follow up  In 1 month- recheck Hgba1c   Mary-Margaret Hassell Done, FNP

## 2015-09-21 ENCOUNTER — Encounter: Payer: Self-pay | Admitting: Nurse Practitioner

## 2015-09-21 DIAGNOSIS — N183 Chronic kidney disease, stage 3 unspecified: Secondary | ICD-10-CM | POA: Insufficient documentation

## 2015-09-21 LAB — CMP14+EGFR
ALBUMIN: 4 g/dL (ref 3.5–4.8)
ALT: 21 IU/L (ref 0–32)
AST: 27 IU/L (ref 0–40)
Albumin/Globulin Ratio: 1.5 (ref 1.2–2.2)
Alkaline Phosphatase: 127 IU/L — ABNORMAL HIGH (ref 39–117)
BUN / CREAT RATIO: 14 (ref 11–26)
BUN: 16 mg/dL (ref 8–27)
Bilirubin Total: 0.3 mg/dL (ref 0.0–1.2)
CALCIUM: 9.9 mg/dL (ref 8.7–10.3)
CO2: 29 mmol/L (ref 18–29)
Chloride: 101 mmol/L (ref 96–106)
Creatinine, Ser: 1.17 mg/dL — ABNORMAL HIGH (ref 0.57–1.00)
GFR, EST AFRICAN AMERICAN: 54 mL/min/{1.73_m2} — AB (ref >59)
GFR, EST NON AFRICAN AMERICAN: 47 mL/min/{1.73_m2} — AB (ref >59)
GLUCOSE: 195 mg/dL — AB (ref 65–99)
Globulin, Total: 2.6 g/dL (ref 1.5–4.5)
Potassium: 5 mmol/L (ref 3.5–5.2)
Sodium: 142 mmol/L (ref 134–144)
TOTAL PROTEIN: 6.6 g/dL (ref 6.0–8.5)

## 2015-09-21 LAB — LIPID PANEL
CHOL/HDL RATIO: 3.5 ratio (ref 0.0–4.4)
Cholesterol, Total: 198 mg/dL (ref 100–199)
HDL: 57 mg/dL (ref >39)
LDL Calculated: 99 mg/dL (ref 0–99)
Triglycerides: 210 mg/dL — ABNORMAL HIGH (ref 0–149)
VLDL Cholesterol Cal: 42 mg/dL — ABNORMAL HIGH (ref 5–40)

## 2015-09-21 LAB — HEPATITIS C ANTIBODY

## 2015-10-23 ENCOUNTER — Encounter: Payer: Self-pay | Admitting: Nurse Practitioner

## 2015-10-23 ENCOUNTER — Ambulatory Visit (INDEPENDENT_AMBULATORY_CARE_PROVIDER_SITE_OTHER): Payer: Medicare Other | Admitting: Nurse Practitioner

## 2015-10-23 DIAGNOSIS — Z794 Long term (current) use of insulin: Secondary | ICD-10-CM

## 2015-10-23 DIAGNOSIS — E1142 Type 2 diabetes mellitus with diabetic polyneuropathy: Secondary | ICD-10-CM | POA: Diagnosis not present

## 2015-10-23 LAB — BAYER DCA HB A1C WAIVED: HB A1C: 11 % — AB (ref <7.0)

## 2015-10-23 MED ORDER — INSULIN GLARGINE 300 UNIT/ML ~~LOC~~ SOPN: 45.0000 [IU] | PEN_INJECTOR | Freq: Every day | SUBCUTANEOUS | Status: DC

## 2015-10-23 MED ORDER — INSULIN GLARGINE 300 UNIT/ML ~~LOC~~ SOPN: 50.0000 [IU] | PEN_INJECTOR | Freq: Every day | SUBCUTANEOUS | Status: DC

## 2015-10-23 NOTE — Progress Notes (Signed)
   Subjective:    Patient ID: Patricia Austin, female    DOB: July 31, 1945, 70 y.o.   MRN: [id number]  HPI Patient in today for diabetic follow up only- she was seen on 09/20/15 for follow up and her Hgba1c was 11%. We increased toujeo to 45u and encouraged her to take her metformin BID. SHe says her blood sugars are doing a little better but she does nit check them very often. This morning her blood sugar was 111.    Review of Systems  Constitutional: Negative.   HENT: Negative.   Respiratory: Negative.   Cardiovascular: Negative.   Genitourinary: Negative.   Neurological: Negative.   Psychiatric/Behavioral: Negative.   All other systems reviewed and are negative.      Objective:   Physical Exam  Constitutional: She is oriented to person, place, and time. She appears well-developed and well-nourished. No distress.  Cardiovascular: Normal rate, regular rhythm and normal heart sounds.   Pulmonary/Chest: Effort normal and breath sounds normal.  Neurological: She is alert and oriented to person, place, and time.  Skin: Skin is warm.  Psychiatric: She has a normal mood and affect. Her behavior is normal. Judgment and thought content normal.   BP 137/71 mmHg  Pulse 78  Temp(Src) 98.3 F (36.8 C) (Oral)  Ht 5\' 2"  (1.575 m)  Wt 209 lb (94.802 kg)  BMI 38.22 kg/m2  HgBa1c 11%- unchanged       Assessment & Plan:   1. Type 2 diabetes mellitus with diabetic polyneuropathy, with long-term current use of insulin (HCC)    Increase toujeo to 50 u daily Stricter carb counting Make sure take metformin BID as rx Recheck in 2 months  [place], FNP

## 2015-11-20 ENCOUNTER — Telehealth: Payer: Self-pay | Admitting: Nurse Practitioner

## 2015-11-20 DIAGNOSIS — E1142 Type 2 diabetes mellitus with diabetic polyneuropathy: Secondary | ICD-10-CM

## 2015-11-20 DIAGNOSIS — Z794 Long term (current) use of insulin: Principal | ICD-10-CM

## 2015-11-20 MED ORDER — GLIPIZIDE-METFORMIN HCL 5-500 MG PO TABS: 2.0000 | ORAL_TABLET | Freq: Two times a day (BID) | ORAL | Status: DC

## 2015-11-20 NOTE — Telephone Encounter (Signed)
New rx sent to pharmacy

## 2015-12-24 ENCOUNTER — Ambulatory Visit: Payer: Self-pay | Admitting: Nurse Practitioner

## 2015-12-28 ENCOUNTER — Encounter: Payer: Self-pay | Admitting: Nurse Practitioner

## 2015-12-28 ENCOUNTER — Ambulatory Visit (INDEPENDENT_AMBULATORY_CARE_PROVIDER_SITE_OTHER): Payer: Medicare Other | Admitting: Nurse Practitioner

## 2015-12-28 ENCOUNTER — Ambulatory Visit: Payer: Self-pay | Admitting: Nurse Practitioner

## 2015-12-28 VITALS — BP 142/72 | HR 92 | Temp 97.9°F | Ht 62.0 in | Wt 209.0 lb

## 2015-12-28 DIAGNOSIS — E1142 Type 2 diabetes mellitus with diabetic polyneuropathy: Secondary | ICD-10-CM

## 2015-12-28 DIAGNOSIS — E785 Hyperlipidemia, unspecified: Secondary | ICD-10-CM

## 2015-12-28 DIAGNOSIS — I159 Secondary hypertension, unspecified: Secondary | ICD-10-CM

## 2015-12-28 DIAGNOSIS — N183 Chronic kidney disease, stage 3 unspecified: Secondary | ICD-10-CM

## 2015-12-28 DIAGNOSIS — Z794 Long term (current) use of insulin: Secondary | ICD-10-CM

## 2015-12-28 DIAGNOSIS — K219 Gastro-esophageal reflux disease without esophagitis: Secondary | ICD-10-CM

## 2015-12-28 LAB — BAYER DCA HB A1C WAIVED: HB A1C (BAYER DCA - WAIVED): 9.3 % — ABNORMAL HIGH (ref <7.0)

## 2015-12-28 MED ORDER — FUROSEMIDE 20 MG PO TABS: 20.0000 mg | ORAL_TABLET | Freq: Every day | ORAL | Status: DC

## 2015-12-28 MED ORDER — GABAPENTIN 300 MG PO CAPS: ORAL_CAPSULE | ORAL | Status: DC

## 2015-12-28 MED ORDER — INSULIN GLARGINE 300 UNIT/ML ~~LOC~~ SOPN: 50.0000 [IU] | PEN_INJECTOR | Freq: Every day | SUBCUTANEOUS | Status: DC

## 2015-12-28 MED ORDER — CARVEDILOL 25 MG PO TABS: ORAL_TABLET | ORAL | Status: DC

## 2015-12-28 MED ORDER — AMLODIPINE BESYLATE 5 MG PO TABS: 5.0000 mg | ORAL_TABLET | Freq: Every day | ORAL | Status: DC

## 2015-12-28 MED ORDER — DULOXETINE HCL 60 MG PO CPEP: 60.0000 mg | ORAL_CAPSULE | Freq: Two times a day (BID) | ORAL | Status: DC

## 2015-12-28 MED ORDER — GLIPIZIDE-METFORMIN HCL 5-500 MG PO TABS: 2.0000 | ORAL_TABLET | Freq: Two times a day (BID) | ORAL | Status: DC

## 2015-12-28 MED ORDER — BENAZEPRIL HCL 40 MG PO TABS: 40.0000 mg | ORAL_TABLET | Freq: Every day | ORAL | Status: DC

## 2015-12-28 NOTE — Progress Notes (Signed)
Subjective:    Patient ID: Patricia Austin, female    DOB: 1945/07/03, 70 y.o.   MRN: [id number]  Patient here today for follow up of chronic medical problems.  Outpatient Encounter Prescriptions as of 12/28/2015  Medication Sig  . amLODipine (NORVASC) 5 MG tablet Take 1 tablet (5 mg total) by mouth daily.  Marland Kitchen aspirin 81 MG tablet Take 81 mg by mouth daily. Reported on 10/23/2015  . benazepril (LOTENSIN) 40 MG tablet Take 1 tablet (40 mg total) by mouth daily before breakfast.  . carvedilol (COREG) 25 MG tablet TAKE  (1)  TABLET TWICE A DAY WITH MEALS (BREAKFAST AND SUPPER)  . Cholecalciferol (VITAMIN D3) 5000 UNITS CAPS Take 5,000 Units by mouth daily.  . diclofenac (VOLTAREN) 75 MG EC tablet TAKE (1) TABLET TWICE A DAY.  . DULoxetine (CYMBALTA) 60 MG capsule Take 1 capsule (60 mg total) by mouth 2 (two) times daily.  . furosemide (LASIX) 20 MG tablet Take 1 tablet (20 mg total) by mouth daily.  Marland Kitchen gabapentin (NEURONTIN) 300 MG capsule TAKE (1) CAPSULE TWICE DAILY.  Marland Kitchen glipiZIDE-metformin (METAGLIP) 5-500 MG tablet Take 2 tablets by mouth 2 (two) times daily before a meal.  . glucose blood (ONE TOUCH ULTRA TEST) test strip Test BID and prn  E11.42  . Insulin Glargine (TOUJEO SOLOSTAR) 300 UNIT/ML SOPN Inject 50 Units into the skin daily.  . Insulin Pen Needle (INSUPEN PEN NEEDLES) 32G X 4 MM MISC 1 each by Does not apply route daily.  Elmore Guise Devices (ONE TOUCH DELICA LANCING DEV) MISC 1 each by Does not apply route 2 (two) times daily. Dx   N05.39  . oxyCODONE-acetaminophen (ROXICET) 5-325 MG per tablet Take 1-2 tablets by mouth every 4 (four) hours as needed.   No facility-administered encounter medications on file as of 12/28/2015.     Hypertension This is a chronic problem. The current episode started more than 1 year ago. The problem is resistant. Pertinent negatives include no chest pain, headaches, neck pain, palpitations or shortness of breath. Risk factors for coronary artery disease  include diabetes mellitus, dyslipidemia, obesity, post-menopausal state and sedentary lifestyle. Past treatments include calcium channel blockers, beta blockers, diuretics and ACE inhibitors. The current treatment provides moderate improvement. Compliance problems include diet and exercise.  Hypertensive end-organ damage includes CAD/MI. There is no history of CVA.  Hyperlipidemia This is a chronic problem. The current episode started more than 1 year ago. Recent lipid tests were reviewed and are variable. Associated symptoms include myalgias. Pertinent negatives include no chest pain or shortness of breath. She is currently on no antihyperlipidemic treatment (statins make her hurt refuses to take). The current treatment provides mild improvement of lipids. Compliance problems include adherence to diet and adherence to exercise.  Risk factors for coronary artery disease include diabetes mellitus, dyslipidemia, hypertension, obesity and post-menopausal.  Diabetes She presents for her follow-up diabetic visit. She has type 2 diabetes mellitus. No MedicAlert identification noted. Her disease course has been fluctuating. Pertinent negatives for hypoglycemia include no headaches. Pertinent negatives for diabetes include no chest pain. There are no diabetic complications. Pertinent negatives for diabetic complications include no CVA. Risk factors for coronary artery disease include diabetes mellitus, dyslipidemia, hypertension, obesity, post-menopausal and sedentary lifestyle. Current diabetic treatment includes oral agent (dual therapy) and insulin injections (patient very noncompliant with her meds- ). She is compliant with treatment some of the time. Her weight is stable. When asked about meal planning, she reported none. She has not had  a previous visit with a dietitian. She never participates in exercise. Her breakfast blood glucose is taken between 9-10 am. Her breakfast blood glucose range is generally 180-200  mg/dl. Her highest blood glucose is >200 mg/dl. Her overall blood glucose range is 140-180 mg/dl. (Does not check blood sugars very often) An ACE inhibitor/angiotensin II receptor blocker is being taken. She does not see a podiatrist.Eye exam is not current.  Fibromyalgia/periperal neuropathy Cymbalta working ok but would like to increase dose because still has achiness all over- she also takes neurotin which helps some. Neurotin helping some GERD Currently not taking anything- has occasional symptoms depending on what she eats  Review of Systems  Constitutional: Negative.   Eyes: Negative.   Respiratory: Negative.  Negative for shortness of breath.   Cardiovascular: Negative for chest pain and palpitations.  Genitourinary: Negative.   Musculoskeletal: Positive for myalgias, back pain and arthralgias. Negative for neck pain.  Neurological: Negative for headaches.       Objective:   Physical Exam  Constitutional: She is oriented to person, place, and time. She appears well-developed and well-nourished.  HENT:  Nose: Nose normal.  Mouth/Throat: Oropharynx is clear and moist.  Eyes: EOM are normal.  Neck: Trachea normal, normal range of motion and full passive range of motion without pain. Neck supple. No JVD present. Carotid bruit is not present. No thyromegaly present.  Cardiovascular: Normal rate, regular rhythm, normal heart sounds and intact distal pulses.  Exam reveals no gallop and no friction rub.   No murmur heard. Pulmonary/Chest: Effort normal and breath sounds normal.  Abdominal: Soft. Bowel sounds are normal. She exhibits no distension and no mass. There is no tenderness.  Musculoskeletal: Normal range of motion.  Lymphadenopathy:    She has no cervical adenopathy.  Neurological: She is alert and oriented to person, place, and time. She has normal reflexes.  Skin: Skin is warm and dry.  Scalp scar tender on right- no edema- no discoloration  Psychiatric: She has a normal  mood and affect. Her behavior is normal. Judgment and thought content normal.   BP 142/72 mmHg  Pulse 92  Temp(Src) 97.9 F (36.6 C) (Oral)  Ht _0  (1.575 m)  Wt 209 lb (94.802 kg)  BMI 38.22 kg/m2  HGBA1c- 9.3% down from 11% at last visit     Assessment & Plan:  1. Type 2 diabetes mellitus with diabetic polyneuropathy, with long-term current use of insulin (HCC) Continue to watch carbs in diet - Bayer DCA Hb A1c Waived - glipiZIDE-metformin (METAGLIP) 5-500 MG tablet; Take 2 tablets by mouth 2 (two) times daily before a meal.  Dispense: 360 tablet; Refill: 1 - Insulin Glargine (TOUJEO SOLOSTAR) 300 UNIT/ML SOPN; Inject 50 Units into the skin daily.  Dispense: 4.5 mL; Refill: 5 - DULoxetine (CYMBALTA) 60 MG capsule; Take 1 capsule (60 mg total) by mouth 2 (two) times daily.  Dispense: 180 capsule; Refill: 0 - gabapentin (NEURONTIN) 300 MG capsule; TAKE (1) CAPSULE TWICE DAILY.  Dispense: 60 capsule; Refill: 4  2. Secondary hypertension, unspecified Do not add salt to diet - CMP14+EGFR - furosemide (LASIX) 20 MG tablet; Take 1 tablet (20 mg total) by mouth daily.  Dispense: 30 tablet; Refill: 4 - benazepril (LOTENSIN) 40 MG tablet; Take 1 tablet (40 mg total) by mouth daily before breakfast.  Dispense: 90 tablet; Refill: 0 - amLODipine (NORVASC) 5 MG tablet; Take 1 tablet (5 mg total) by mouth daily.  Dispense: 90 tablet; Refill: 1 - carvedilol (COREG) 25  MG tablet; TAKE  (1)  TABLET TWICE A DAY WITH MEALS (BREAKFAST AND SUPPER)  Dispense: 180 tablet; Refill: 0  3. Gastroesophageal reflux disease without esophagitis Avoid spicy foods Do not eat 2 hours prior to bedtime  4. CKD (chronic kidney disease) stage 3, GFR 30-59 ml/min Continuing to watch labs  5. Morbid obesity, unspecified obesity type ([place]) Discussed diet and exercise for person with BMI >25 Will recheck weight in 3-6 months  6. Hyperlipidemia with target LDL less than 100 Low fat diet - Lipid panel    Labs  pending Health maintenance reviewed Diet and exercise encouraged Continue all meds Follow up  In 3 months   [place], FNP

## 2015-12-29 LAB — LIPID PANEL
CHOLESTEROL TOTAL: 173 mg/dL (ref 100–199)
Chol/HDL Ratio: 3.7 ratio units (ref 0.0–4.4)
HDL: 47 mg/dL (ref >39)
LDL CALC: 88 mg/dL (ref 0–99)
Triglycerides: 192 mg/dL — ABNORMAL HIGH (ref 0–149)
VLDL Cholesterol Cal: 38 mg/dL (ref 5–40)

## 2015-12-29 LAB — CMP14+EGFR
A/G RATIO: 1.5 (ref 1.2–2.2)
ALBUMIN: 3.8 g/dL (ref 3.5–4.8)
ALK PHOS: 129 IU/L — AB (ref 39–117)
ALT: 17 IU/L (ref 0–32)
AST: 24 IU/L (ref 0–40)
BILIRUBIN TOTAL: 0.3 mg/dL (ref 0.0–1.2)
BUN / CREAT RATIO: 16 (ref 12–28)
BUN: 18 mg/dL (ref 8–27)
CHLORIDE: 98 mmol/L (ref 96–106)
CO2: 26 mmol/L (ref 18–29)
CREATININE: 1.16 mg/dL — AB (ref 0.57–1.00)
Calcium: 9.9 mg/dL (ref 8.7–10.3)
GFR calc Af Amer: 55 mL/min/{1.73_m2} — ABNORMAL LOW (ref >59)
GFR calc non Af Amer: 47 mL/min/{1.73_m2} — ABNORMAL LOW (ref >59)
GLOBULIN, TOTAL: 2.6 g/dL (ref 1.5–4.5)
Glucose: 182 mg/dL — ABNORMAL HIGH (ref 65–99)
POTASSIUM: 4.9 mmol/L (ref 3.5–5.2)
SODIUM: 141 mmol/L (ref 134–144)
Total Protein: 6.4 g/dL (ref 6.0–8.5)

## 2016-01-30 LAB — HM DIABETES EYE EXAM

## 2016-02-28 DIAGNOSIS — G5622 Lesion of ulnar nerve, left upper limb: Secondary | ICD-10-CM | POA: Diagnosis not present

## 2016-02-28 DIAGNOSIS — M4806 Spinal stenosis, lumbar region: Secondary | ICD-10-CM | POA: Diagnosis not present

## 2016-03-05 DIAGNOSIS — H25812 Combined forms of age-related cataract, left eye: Secondary | ICD-10-CM | POA: Diagnosis not present

## 2016-03-05 DIAGNOSIS — H2511 Age-related nuclear cataract, right eye: Secondary | ICD-10-CM | POA: Diagnosis not present

## 2016-03-05 DIAGNOSIS — H5201 Hypermetropia, right eye: Secondary | ICD-10-CM | POA: Diagnosis not present

## 2016-03-05 DIAGNOSIS — E113291 Type 2 diabetes mellitus with mild nonproliferative diabetic retinopathy without macular edema, right eye: Secondary | ICD-10-CM | POA: Diagnosis not present

## 2016-03-05 DIAGNOSIS — H04123 Dry eye syndrome of bilateral lacrimal glands: Secondary | ICD-10-CM | POA: Diagnosis not present

## 2016-03-05 DIAGNOSIS — Z794 Long term (current) use of insulin: Secondary | ICD-10-CM | POA: Diagnosis not present

## 2016-03-05 LAB — HM DIABETES EYE EXAM

## 2016-03-06 DIAGNOSIS — G5622 Lesion of ulnar nerve, left upper limb: Secondary | ICD-10-CM | POA: Diagnosis not present

## 2016-03-07 DIAGNOSIS — M4806 Spinal stenosis, lumbar region: Secondary | ICD-10-CM | POA: Diagnosis not present

## 2016-03-14 DIAGNOSIS — I10 Essential (primary) hypertension: Secondary | ICD-10-CM | POA: Diagnosis not present

## 2016-03-14 DIAGNOSIS — G5602 Carpal tunnel syndrome, left upper limb: Secondary | ICD-10-CM | POA: Diagnosis not present

## 2016-03-17 DIAGNOSIS — G5602 Carpal tunnel syndrome, left upper limb: Secondary | ICD-10-CM | POA: Diagnosis not present

## 2016-03-17 DIAGNOSIS — M4316 Spondylolisthesis, lumbar region: Secondary | ICD-10-CM | POA: Diagnosis not present

## 2016-03-17 DIAGNOSIS — M4806 Spinal stenosis, lumbar region: Secondary | ICD-10-CM | POA: Diagnosis not present

## 2016-03-28 DIAGNOSIS — G5602 Carpal tunnel syndrome, left upper limb: Secondary | ICD-10-CM | POA: Diagnosis not present

## 2016-04-10 ENCOUNTER — Ambulatory Visit (INDEPENDENT_AMBULATORY_CARE_PROVIDER_SITE_OTHER): Payer: Medicare Other | Admitting: Nurse Practitioner

## 2016-04-10 ENCOUNTER — Encounter: Payer: Self-pay | Admitting: Nurse Practitioner

## 2016-04-10 VITALS — BP 134/88 | HR 75 | Temp 98.2°F | Ht 62.0 in | Wt 205.0 lb

## 2016-04-10 DIAGNOSIS — Z794 Long term (current) use of insulin: Secondary | ICD-10-CM | POA: Diagnosis not present

## 2016-04-10 DIAGNOSIS — Z23 Encounter for immunization: Secondary | ICD-10-CM

## 2016-04-10 DIAGNOSIS — E1142 Type 2 diabetes mellitus with diabetic polyneuropathy: Secondary | ICD-10-CM

## 2016-04-10 DIAGNOSIS — E785 Hyperlipidemia, unspecified: Secondary | ICD-10-CM

## 2016-04-10 DIAGNOSIS — I10 Essential (primary) hypertension: Secondary | ICD-10-CM | POA: Diagnosis not present

## 2016-04-10 LAB — BAYER DCA HB A1C WAIVED: HB A1C: 8.9 % — AB (ref <7.0)

## 2016-04-10 MED ORDER — BENAZEPRIL HCL 40 MG PO TABS: 40.0000 mg | ORAL_TABLET | Freq: Every day | ORAL | 1 refills | Status: DC

## 2016-04-10 MED ORDER — CARVEDILOL 25 MG PO TABS: ORAL_TABLET | ORAL | 1 refills | Status: DC

## 2016-04-10 MED ORDER — FUROSEMIDE 20 MG PO TABS: 20.0000 mg | ORAL_TABLET | Freq: Every day | ORAL | 1 refills | Status: DC

## 2016-04-10 MED ORDER — DULOXETINE HCL 60 MG PO CPEP: 60.0000 mg | ORAL_CAPSULE | Freq: Two times a day (BID) | ORAL | 1 refills | Status: DC

## 2016-04-10 NOTE — Patient Instructions (Signed)
Diabetes and Foot Care Diabetes may cause you to have problems because of poor blood supply (circulation) to your feet and legs. This may cause the skin on your feet to become thinner, break easier, and heal more slowly. Your skin may become dry, and the skin may peel and crack. You may also have nerve damage in your legs and feet causing decreased feeling in them. You may not notice minor injuries to your feet that could lead to infections or more serious problems. Taking care of your feet is one of the most important things you can do for yourself.  HOME CARE INSTRUCTIONS  Wear shoes at all times, even in the house. Do not go barefoot. Bare feet are easily injured.  Check your feet daily for blisters, cuts, and redness. If you cannot see the bottom of your feet, use a mirror or ask someone for help.  Wash your feet with warm water (do not use hot water) and mild soap. Then pat your feet and the areas between your toes until they are completely dry. Do not soak your feet as this can dry your skin.  Apply a moisturizing lotion or petroleum jelly (that does not contain alcohol and is unscented) to the skin on your feet and to dry, brittle toenails. Do not apply lotion between your toes.  Trim your toenails straight across. Do not dig under them or around the cuticle. File the edges of your nails with an emery board or nail file.  Do not cut corns or calluses or try to remove them with medicine.  Wear clean socks or stockings every day. Make sure they are not too tight. Do not wear knee-high stockings since they may decrease blood flow to your legs.  Wear shoes that fit properly and have enough cushioning. To break in new shoes, wear them for just a few hours a day. This prevents you from injuring your feet. Always look in your shoes before you put them on to be sure there are no objects inside.  Do not cross your legs. This may decrease the blood flow to your feet.  If you find a minor scrape,  cut, or break in the skin on your feet, keep it and the skin around it clean and dry. These areas may be cleansed with mild soap and water. Do not cleanse the area with peroxide, alcohol, or iodine.  When you remove an adhesive bandage, be sure not to damage the skin around it.  If you have a wound, look at it several times a day to make sure it is healing.  Do not use heating pads or hot water bottles. They may burn your skin. If you have lost feeling in your feet or legs, you may not know it is happening until it is too late.  Make sure your health care provider performs a complete foot exam at least annually or more often if you have foot problems. Report any cuts, sores, or bruises to your health care provider immediately. SEEK MEDICAL CARE IF:   You have an injury that is not healing.  You have cuts or breaks in the skin.  You have an ingrown nail.  You notice redness on your legs or feet.  You feel burning or tingling in your legs or feet.  You have pain or cramps in your legs and feet.  Your legs or feet are numb.  Your feet always feel cold. SEEK IMMEDIATE MEDICAL CARE IF:   There is increasing redness,   swelling, or pain in or around a wound.  There is a red line that goes up your leg.  Pus is coming from a wound.  You develop a fever or as directed by your health care provider.  You notice a bad smell coming from an ulcer or wound.   This information is not intended to replace advice given to you by your health care provider. Make sure you discuss any questions you have with your health care provider.   Document Released: 06/06/2000 Document Revised: 02/09/2013 Document Reviewed: 11/16/2012 [employer] Interactive Patient Education 2016 [employer]  

## 2016-04-10 NOTE — Progress Notes (Signed)
Subjective:    Patient ID: Patricia Austin, female    DOB: 1945-07-11, 70 y.o.   MRN: [id number]  Patient here today for follow up of chronic medical problems.  Outpatient Encounter Prescriptions as of 04/10/2016  Medication Sig  . amLODipine (NORVASC) 5 MG tablet Take 1 tablet (5 mg total) by mouth daily.  Marland Kitchen aspirin 81 MG tablet Take 81 mg by mouth daily. Reported on 10/23/2015  . benazepril (LOTENSIN) 40 MG tablet Take 1 tablet (40 mg total) by mouth daily before breakfast.  . carvedilol (COREG) 25 MG tablet TAKE  (1)  TABLET TWICE A DAY WITH MEALS (BREAKFAST AND SUPPER)  . Cholecalciferol (VITAMIN D3) 5000 UNITS CAPS Take 5,000 Units by mouth daily.  . diclofenac (VOLTAREN) 75 MG EC tablet TAKE (1) TABLET TWICE A DAY.  . DULoxetine (CYMBALTA) 60 MG capsule Take 1 capsule (60 mg total) by mouth 2 (two) times daily.  . furosemide (LASIX) 20 MG tablet Take 1 tablet (20 mg total) by mouth daily.  Marland Kitchen gabapentin (NEURONTIN) 300 MG capsule TAKE (1) CAPSULE TWICE DAILY.  Marland Kitchen glipiZIDE-metformin (METAGLIP) 5-500 MG tablet Take 2 tablets by mouth 2 (two) times daily before a meal.  . glucose blood (ONE TOUCH ULTRA TEST) test strip Test BID and prn  E11.42  . Insulin Glargine (TOUJEO SOLOSTAR) 300 UNIT/ML SOPN Inject 50 Units into the skin daily.  . Insulin Pen Needle (INSUPEN PEN NEEDLES) 32G X 4 MM MISC 1 each by Does not apply route daily.  Elmore Guise Devices (ONE TOUCH DELICA LANCING DEV) MISC 1 each by Does not apply route 2 (two) times daily. Dx   U27.25  . oxyCODONE-acetaminophen (ROXICET) 5-325 MG per tablet Take 1-2 tablets by mouth every 4 (four) hours as needed.   No facility-administered encounter medications on file as of 04/10/2016.    * Having back surgery in January due to DDD causing problems with walking.  Hypertension  This is a chronic problem. The current episode started more than 1 year ago. The problem is resistant. Pertinent negatives include no chest pain, headaches, neck pain,  palpitations or shortness of breath. Risk factors for coronary artery disease include diabetes mellitus, dyslipidemia, obesity, post-menopausal state and sedentary lifestyle. Past treatments include calcium channel blockers, beta blockers, diuretics and ACE inhibitors. The current treatment provides moderate improvement. Compliance problems include diet and exercise.  Hypertensive end-organ damage includes CAD/MI. There is no history of CVA.  Hyperlipidemia  This is a chronic problem. The current episode started more than 1 year ago. Recent lipid tests were reviewed and are variable. Associated symptoms include myalgias. Pertinent negatives include no chest pain or shortness of breath. She is currently on no antihyperlipidemic treatment (statins make her hurt refuses to take). The current treatment provides mild improvement of lipids. Compliance problems include adherence to diet and adherence to exercise.  Risk factors for coronary artery disease include diabetes mellitus, dyslipidemia, hypertension, obesity and post-menopausal.  Diabetes  She presents for her follow-up diabetic visit. She has type 2 diabetes mellitus. No MedicAlert identification noted. Her disease course has been fluctuating. Pertinent negatives for hypoglycemia include no headaches. Pertinent negatives for diabetes include no chest pain. There are no diabetic complications. Pertinent negatives for diabetic complications include no CVA. Risk factors for coronary artery disease include diabetes mellitus, dyslipidemia, hypertension, obesity, post-menopausal and sedentary lifestyle. Current diabetic treatment includes oral agent (dual therapy) and insulin injections (patient very noncompliant with her meds- toujeo was increased to 50u daily. she did not increase  it until a month ago.). She is compliant with treatment some of the time. Her weight is stable. When asked about meal planning, she reported none. She has not had a previous visit with a  dietitian. She never participates in exercise. Her breakfast blood glucose is taken between 9-10 am. Her breakfast blood glucose range is generally 180-200 mg/dl. Her highest blood glucose is >200 mg/dl. Her overall blood glucose range is 140-180 mg/dl. (Does not check blood sugars very often) An ACE inhibitor/angiotensin II receptor blocker is being taken. She does not see a podiatrist.Eye exam is not current.  Fibromyalgia/periperal neuropathy Cymbalta working ok but would like to increase dose because still has achiness all over- she also takes neurotin which helps some. Neurotin helping some GERD Currently not taking anything- has occasional symptoms depending on what she eats  Review of Systems  Constitutional: Negative.   Eyes: Negative.   Respiratory: Negative.  Negative for shortness of breath.   Cardiovascular: Negative for chest pain and palpitations.  Genitourinary: Negative.   Musculoskeletal: Positive for arthralgias, back pain and myalgias. Negative for neck pain.  Neurological: Negative for headaches.       Objective:   Physical Exam  Constitutional: She is oriented to person, place, and time. She appears well-developed and well-nourished.  HENT:  Nose: Nose normal.  Mouth/Throat: Oropharynx is clear and moist.  Eyes: EOM are normal.  Neck: Trachea normal, normal range of motion and full passive range of motion without pain. Neck supple. No JVD present. Carotid bruit is not present. No thyromegaly present.  Cardiovascular: Normal rate, regular rhythm, normal heart sounds and intact distal pulses.  Exam reveals no gallop and no friction rub.   No murmur heard. Pulmonary/Chest: Effort normal and breath sounds normal.  Abdominal: Soft. Bowel sounds are normal. She exhibits no distension and no mass. There is no tenderness.  Musculoskeletal: Normal range of motion.  Lymphadenopathy:    She has no cervical adenopathy.  Neurological: She is alert and oriented to person,  place, and time. She has normal reflexes.  Skin: Skin is warm and dry.  Scalp scar tender on right- no edema- no discoloration  Psychiatric: She has a normal mood and affect. Her behavior is normal. Judgment and thought content normal.   BP 134/88 (BP Location: Left Arm, Cuff Size: Normal)   Pulse 75   Temp 98.2 F (36.8 C) (Oral)   Ht '5\' 2"'$  (1.575 m)   Wt 205 lb (93 kg)   BMI 37.49 kg/m    Hgba1c 8.9% down from 9.3%     Assessment & Plan:  1. Type 2 diabetes mellitus with diabetic polyneuropathy, with long-term current use of insulin (HCC) Must take toujeo 50 u daily Continue all other meds Continue to watch carbs in diet - Bayer DCA Hb A1c Waived - Microalbumin / creatinine urine ratio  2. Hyperlipidemia with target LDL less than 100 Low fat diet - Lipid panel  3. Essential hypertension Do nt add salt to diet - CMP14+EGFR   Encouraged to do hemoccult cards given at last appointment Labs pending Health maintenance reviewed Diet and exercise encouraged Continue all meds Follow up  In 3 months   [place], FNP

## 2016-04-11 LAB — CMP14+EGFR
A/G RATIO: 1.5 (ref 1.2–2.2)
ALBUMIN: 4.2 g/dL (ref 3.5–4.8)
ALK PHOS: 125 IU/L — AB (ref 39–117)
ALT: 24 IU/L (ref 0–32)
AST: 31 IU/L (ref 0–40)
BUN / CREAT RATIO: 14 (ref 12–28)
BUN: 17 mg/dL (ref 8–27)
Bilirubin Total: 0.3 mg/dL (ref 0.0–1.2)
CALCIUM: 10 mg/dL (ref 8.7–10.3)
CO2: 24 mmol/L (ref 18–29)
CREATININE: 1.19 mg/dL — AB (ref 0.57–1.00)
Chloride: 101 mmol/L (ref 96–106)
GFR calc Af Amer: 53 mL/min/{1.73_m2} — ABNORMAL LOW (ref >59)
GFR, EST NON AFRICAN AMERICAN: 46 mL/min/{1.73_m2} — AB (ref >59)
GLOBULIN, TOTAL: 2.8 g/dL (ref 1.5–4.5)
Glucose: 133 mg/dL — ABNORMAL HIGH (ref 65–99)
POTASSIUM: 4.9 mmol/L (ref 3.5–5.2)
SODIUM: 144 mmol/L (ref 134–144)
Total Protein: 7 g/dL (ref 6.0–8.5)

## 2016-04-11 LAB — LIPID PANEL
CHOL/HDL RATIO: 3.7 ratio (ref 0.0–4.4)
Cholesterol, Total: 195 mg/dL (ref 100–199)
HDL: 53 mg/dL (ref >39)
LDL CALC: 109 mg/dL — AB (ref 0–99)
Triglycerides: 167 mg/dL — ABNORMAL HIGH (ref 0–149)
VLDL Cholesterol Cal: 33 mg/dL (ref 5–40)

## 2016-04-30 ENCOUNTER — Other Ambulatory Visit: Payer: Self-pay | Admitting: Neurosurgery

## 2016-05-05 DIAGNOSIS — I10 Essential (primary) hypertension: Secondary | ICD-10-CM | POA: Diagnosis not present

## 2016-05-05 DIAGNOSIS — M48062 Spinal stenosis, lumbar region with neurogenic claudication: Secondary | ICD-10-CM | POA: Diagnosis not present

## 2016-05-06 ENCOUNTER — Telehealth: Payer: Self-pay | Admitting: Nurse Practitioner

## 2016-05-06 NOTE — Telephone Encounter (Signed)
Appt made to see provider for painful, itchy rash

## 2016-05-07 ENCOUNTER — Encounter: Payer: Self-pay | Admitting: Family Medicine

## 2016-05-07 ENCOUNTER — Ambulatory Visit (INDEPENDENT_AMBULATORY_CARE_PROVIDER_SITE_OTHER): Payer: Medicare Other | Admitting: Family Medicine

## 2016-05-07 VITALS — BP 130/85 | HR 85 | Temp 98.6°F | Ht 62.0 in | Wt 205.4 lb

## 2016-05-07 DIAGNOSIS — M792 Neuralgia and neuritis, unspecified: Secondary | ICD-10-CM

## 2016-05-07 NOTE — Progress Notes (Signed)
BP 130/85   Pulse 85   Temp 98.6 F (37 C) (Oral)   Ht 5\' 2"  (1.575 m)   Wt 205 lb 6 oz (93.2 kg)   BMI 37.56 kg/m    Subjective:    Patient ID: Patricia Austin, female    DOB: [birth year], 70 y.o.   MRN: [id number]  HPI: Patricia Austin is a 70 y.o. female presenting on 05/07/2016 for Left side pain (over a week ago began having extreme itching on left side, followed by extreme pain, soreness and feeling of "pin pricks" on her side, never had any rash)   HPI Left sided thoracic back pain Patient started out having pruritus over her left thoracic back and then it developed over the past week into sharp pins and needles sensation. She says the sharp pins and needle sensation is slightly improved today. She still feels very pruritic over that area she denies any fevers or chills or drainage. She was concerned about whether it could be shingles. She does not know if she had chickenpox when she was younger but she did have the shingles vaccination about 6 or 7 years ago. She has not had any rash or has not developed any rash.  Relevant past medical, surgical, family and social history reviewed and updated as indicated. Interim medical history since our last visit reviewed. Allergies and medications reviewed and updated.  Review of Systems  Constitutional: Negative for chills and fever.  HENT: Negative for congestion, ear discharge and ear pain.   Respiratory: Negative for chest tightness and shortness of breath.   Cardiovascular: Negative for chest pain and leg swelling.  Musculoskeletal: Negative for back pain and gait problem.  Skin: Negative for color change and rash.  Neurological: Negative for light-headedness and headaches.  Psychiatric/Behavioral: Negative for agitation and behavioral problems.  All other systems reviewed and are negative.   Per HPI unless specifically indicated above     Objective:    BP 130/85   Pulse 85   Temp 98.6 F (37 C) (Oral)   Ht 5\' 2"  (1.575 m)    Wt 205 lb 6 oz (93.2 kg)   BMI 37.56 kg/m   Wt Readings from Last 3 Encounters:  05/07/16 205 lb 6 oz (93.2 kg)  04/10/16 205 lb (93 kg)  12/28/15 209 lb (94.8 kg)    Physical Exam  Constitutional: She is oriented to person, place, and time. She appears well-developed and well-nourished. No distress.  Eyes: Conjunctivae are normal.  Cardiovascular: Normal rate, regular rhythm, normal heart sounds and intact distal pulses.   No murmur heard. Pulmonary/Chest: Effort normal and breath sounds normal. No respiratory distress. She has no wheezes. She has no rales.  Musculoskeletal: Normal range of motion. She exhibits no edema.       Thoracic back: She exhibits tenderness.       Back:  Neurological: She is alert and oriented to person, place, and time. Coordination normal.  Skin: Skin is warm and dry. No rash noted. She is not diaphoretic. No erythema.  Psychiatric: She has a normal mood and affect. Her behavior is normal.  Nursing note and vitals reviewed.     Assessment & Plan:   Problem List Items Addressed This Visit    None    Visit Diagnoses    Nerve pain    -  Primary   Pain along a dermatomal nerve along her left thorax, and around into her abdomen. Been going on one week, no rash, we'll  test for varicella   Relevant Orders   Varicella Zoster Abs, IgG/IgM   Vitamin B12       Follow up plan: Return if symptoms worsen or fail to improve.  Counseling provided for all of the vaccine components Orders Placed This Encounter  Procedures  . Varicella Zoster Abs, IgG/IgM  . Vitamin B12    Caryl Pina, MD [facility] 05/07/2016, 11:41 AM

## 2016-05-08 LAB — VARICELLA ZOSTER ABS, IGG/IGM: Varicella zoster IgG: 979 index (ref >165)

## 2016-05-08 LAB — VITAMIN B12: Vitamin B-12: 469 pg/mL (ref 211–946)

## 2016-05-20 ENCOUNTER — Ambulatory Visit (INDEPENDENT_AMBULATORY_CARE_PROVIDER_SITE_OTHER): Payer: Medicare Other | Admitting: Nurse Practitioner

## 2016-05-20 ENCOUNTER — Encounter: Payer: Self-pay | Admitting: Nurse Practitioner

## 2016-05-20 VITALS — BP 128/71 | HR 71 | Temp 97.4°F | Ht 62.0 in | Wt 204.0 lb

## 2016-05-20 DIAGNOSIS — B0229 Other postherpetic nervous system involvement: Secondary | ICD-10-CM

## 2016-05-20 MED ORDER — OXYCODONE-ACETAMINOPHEN 5-325 MG PO TABS: 1.0000 | ORAL_TABLET | ORAL | 0 refills | Status: DC | PRN

## 2016-05-20 NOTE — Progress Notes (Signed)
   Subjective:    Patient ID: Patricia Austin, female    DOB: April 18, 1946, 70 y.o.   MRN: [id number]  HPI Patient has had back problems for many years- she went to see back doctor last week and is having surgery January 5th. SHe is c/o of left flank pain that started about a  Month ago. The flank actually started out as an itch for over a week then it turned to being painful to touch and radiates around to back- she denies a rash. SHe says pain is an ache now. Rates pain 5/10. Pian just throbs all day.     Review of Systems  Constitutional: Negative.   HENT: Negative.   Respiratory: Negative.   Cardiovascular: Negative.   Genitourinary: Negative.   Musculoskeletal: Positive for back pain.  Neurological: Negative.   Psychiatric/Behavioral: Negative.   All other systems reviewed and are negative.      Objective:   Physical Exam  Constitutional: She is oriented to person, place, and time. She appears well-developed and well-nourished. No distress.  Cardiovascular: Normal rate, regular rhythm and normal heart sounds.   Pulmonary/Chest: Effort normal and breath sounds normal.  Musculoskeletal:  FROM of lumbar spine with pain on rotation (-) SLR bil .Motor strength and sensation distally intact   Neurological: She is alert and oriented to person, place, and time.  Skin: Skin is warm.  Psychiatric: She has a normal mood and affect. Her behavior is normal. Judgment and thought content normal.   BP 128/71   Pulse 71   Temp 97.4 F (36.3 C) (Oral)   Ht 5\' 2"  (1.575 m)   Wt 204 lb (92.5 kg)   BMI 37.31 kg/m         Assessment & Plan:  1. Post herpetic neuralgia Meds ordered this encounter  Medications  . oxyCODONE-acetaminophen (ROXICET) 5-325 MG tablet    Sig: Take 1-2 tablets by mouth every 4 (four) hours as needed.    Dispense:  40 tablet    Refill:  0    Order Specific Question:   Supervising Provider    Answer:   Eustaquio Maize [[id number]]   Patient past time window for  valtrex Nothing more we can do Warm conpresses may help RTO prn  Mary-Margaret Hassell Done, FNP

## 2016-06-13 ENCOUNTER — Telehealth: Payer: Self-pay | Admitting: Nurse Practitioner

## 2016-06-13 NOTE — Telephone Encounter (Signed)
Patient aware that we are currently out of toujeo samples.  Patient will call back mid next week

## 2016-06-19 ENCOUNTER — Inpatient Hospital Stay (HOSPITAL_COMMUNITY)
Admission: RE | Admit: 2016-06-19 | Discharge: 2016-06-19 | Disposition: A | Discharge status: Home / Self Care | Payer: Self-pay | Source: Ambulatory Visit

## 2016-06-19 NOTE — Pre-Procedure Instructions (Signed)
Mykelti Baar Bermea  06/19/2016      [facility] [place] [address] [address] Phone: [phone] Fax: [phone]    Your procedure is scheduled on 06/27/16.  Report to [facility] Admitting at 7 A.M.  Call this number if you have problems the morning of surgery:  [phone]   Remember:  Do not eat food or drink liquids after midnight.  Take these medicines the morning of surgery with A SIP OF WATER --neurontin,cymbalta,amlodipine,carvedilol,oxycocodone   Do not wear jewelry, make-up or nail polish.  Do not wear lotions, powders, or perfumes, or deoderant.  Do not shave 48 hours prior to surgery.  Men may shave face and neck.  Do not bring valuables to the hospital.  [facility] is not responsible for any belongings or valuables.  Contacts, dentures or bridgework may not be worn into surgery.  Leave your suitcase in the car.  After surgery it may be brought to your room.  For patients admitted to the hospital, discharge time will be determined by your treatment team.  Patients discharged the day of surgery will not be allowed to drive home.   Name and phone number of your driver:    Special instructions:  Do not take any aspirin,anti-inflammatories,vitamins,or herbal supplements 5-7 days prior to surgery.  Please read over the following fact sheets that you were given. MRSA Information    How to Manage Your Diabetes Before and After Surgery  Why is it important to control my blood sugar before and after surgery? . Improving blood sugar levels before and after surgery helps healing and can limit problems. . A way of improving blood sugar control is eating a healthy diet by: o  Eating less sugar and carbohydrates o  Increasing activity/exercise o  Talking with your doctor about reaching your blood sugar goals . High blood sugars (greater than 180 mg/dL) can raise your risk of infections and slow your  recovery, so you will need to focus on controlling your diabetes during the weeks before surgery. . Make sure that the doctor who takes care of your diabetes knows about your planned surgery including the date and location.  How do I manage my blood sugar before surgery? . Check your blood sugar at least 4 times a day, starting 2 days before surgery, to make sure that the level is not too high or low. o Check your blood sugar the morning of your surgery when you wake up and every 2 hours until you get to the Short Stay unit. . If your blood sugar is less than 70 mg/dL, you will need to treat for low blood sugar: o Do not take insulin. o Treat a low blood sugar (less than 70 mg/dL) with  cup of clear juice (cranberry or apple), 4 glucose tablets, OR glucose gel. o Recheck blood sugar in 15 minutes after treatment (to make sure it is greater than 70 mg/dL). If your blood sugar is not greater than 70 mg/dL on recheck, call [phone] for further instructions. . Report your blood sugar to the short stay nurse when you get to Short Stay.  . If you are admitted to the hospital after surgery: o Your blood sugar will be checked by the staff and you will probably be given insulin after surgery (instead of oral diabetes medicines) to make sure you have good blood sugar levels. o The goal for blood sugar control after surgery is  80-180 mg/dL.              WHAT DO I DO ABOUT MY DIABETES MEDICATION?   Marland Kitchen Do not take oral diabetes medicines (pills) the morning of surgery.  . THE NIGHT BEFORE SURGERY, take ___________ units of ___________insulin.       Marland Kitchen HE MORNING OF SURGERY, take _____________ units of __________insulin.  . The day of surgery, do not take other diabetes injectables, including Byetta (exenatide), Bydureon (exenatide ER), Victoza (liraglutide), or Trulicity (dulaglutide).  . If your CBG is greater than 220 mg/dL, you may take  of your sliding scale (correction) dose of  insulin.  Other Instructions:          Patient Signature:  Date:   Nurse Signature:  Date:   Reviewed and Endorsed by [facility] Patient Education Committee, August 2015

## 2016-07-08 ENCOUNTER — Telehealth: Payer: Self-pay | Admitting: Nurse Practitioner

## 2016-07-08 NOTE — Telephone Encounter (Signed)
Pt given appt with MMM 1/26 at 10:30 for 3 month follow up.

## 2016-07-18 ENCOUNTER — Ambulatory Visit (INDEPENDENT_AMBULATORY_CARE_PROVIDER_SITE_OTHER): Payer: Medicare Other | Admitting: Nurse Practitioner

## 2016-07-18 ENCOUNTER — Encounter: Payer: Self-pay | Admitting: Nurse Practitioner

## 2016-07-18 VITALS — BP 148/84 | HR 88 | Temp 97.9°F | Ht 62.0 in | Wt 202.0 lb

## 2016-07-18 DIAGNOSIS — R600 Localized edema: Secondary | ICD-10-CM | POA: Insufficient documentation

## 2016-07-18 DIAGNOSIS — E1142 Type 2 diabetes mellitus with diabetic polyneuropathy: Secondary | ICD-10-CM | POA: Diagnosis not present

## 2016-07-18 DIAGNOSIS — R609 Edema, unspecified: Secondary | ICD-10-CM

## 2016-07-18 DIAGNOSIS — M797 Fibromyalgia: Secondary | ICD-10-CM

## 2016-07-18 DIAGNOSIS — N183 Chronic kidney disease, stage 3 unspecified: Secondary | ICD-10-CM

## 2016-07-18 DIAGNOSIS — M170 Bilateral primary osteoarthritis of knee: Secondary | ICD-10-CM | POA: Diagnosis not present

## 2016-07-18 DIAGNOSIS — E785 Hyperlipidemia, unspecified: Secondary | ICD-10-CM | POA: Diagnosis not present

## 2016-07-18 DIAGNOSIS — I10 Essential (primary) hypertension: Secondary | ICD-10-CM | POA: Diagnosis not present

## 2016-07-18 DIAGNOSIS — K219 Gastro-esophageal reflux disease without esophagitis: Secondary | ICD-10-CM | POA: Diagnosis not present

## 2016-07-18 DIAGNOSIS — Z794 Long term (current) use of insulin: Secondary | ICD-10-CM | POA: Diagnosis not present

## 2016-07-18 LAB — BAYER DCA HB A1C WAIVED: HB A1C: 10 % — AB (ref <7.0)

## 2016-07-18 MED ORDER — CARVEDILOL 25 MG PO TABS: ORAL_TABLET | ORAL | 1 refills | Status: DC

## 2016-07-18 MED ORDER — INSULIN GLARGINE 300 UNIT/ML ~~LOC~~ SOPN
50.0000 [IU] | PEN_INJECTOR | Freq: Every day | SUBCUTANEOUS | 5 refills | Status: DC
Start: 2016-07-18 — End: 2016-08-15

## 2016-07-18 MED ORDER — DULOXETINE HCL 60 MG PO CPEP: 60.0000 mg | ORAL_CAPSULE | Freq: Two times a day (BID) | ORAL | 1 refills | Status: DC

## 2016-07-18 MED ORDER — GLIPIZIDE-METFORMIN HCL 5-500 MG PO TABS: 2.0000 | ORAL_TABLET | Freq: Two times a day (BID) | ORAL | 1 refills | Status: DC

## 2016-07-18 MED ORDER — GABAPENTIN 300 MG PO CAPS: ORAL_CAPSULE | ORAL | 4 refills | Status: DC

## 2016-07-18 MED ORDER — FUROSEMIDE 20 MG PO TABS: 20.0000 mg | ORAL_TABLET | Freq: Every day | ORAL | 1 refills | Status: DC

## 2016-07-18 MED ORDER — DICLOFENAC SODIUM 75 MG PO TBEC: DELAYED_RELEASE_TABLET | ORAL | 1 refills | Status: DC

## 2016-07-18 MED ORDER — BENAZEPRIL HCL 40 MG PO TABS: 40.0000 mg | ORAL_TABLET | Freq: Every day | ORAL | 1 refills | Status: DC

## 2016-07-18 MED ORDER — AMLODIPINE BESYLATE 5 MG PO TABS: 5.0000 mg | ORAL_TABLET | Freq: Every day | ORAL | 1 refills | Status: DC

## 2016-07-18 NOTE — Progress Notes (Signed)
Subjective:    Patient ID: Patricia Austin, female    DOB: 1946/06/09, 71 y.o.   MRN: [id number]  Patient here today for follow up of chronic medical problems. Patient said sh efell into donut hole and did not get toujeo filled in December- she decreased the anot she was taking to make it last until the first of January- she is now back on her regular dose.  Outpatient Encounter Prescriptions as of 07/18/2016  Medication Sig  . amLODipine (NORVASC) 5 MG tablet Take 1 tablet (5 mg total) by mouth daily.  Marland Kitchen aspirin 81 MG tablet Take 81 mg by mouth daily. Reported on 10/23/2015  . benazepril (LOTENSIN) 40 MG tablet Take 1 tablet (40 mg total) by mouth daily before breakfast.  . carvedilol (COREG) 25 MG tablet TAKE  (1)  TABLET TWICE A DAY WITH MEALS (BREAKFAST AND SUPPER)  . Cholecalciferol (VITAMIN D3) 5000 UNITS CAPS Take 5,000 Units by mouth daily.  . diclofenac (VOLTAREN) 75 MG EC tablet TAKE (1) TABLET TWICE A DAY.  . DULoxetine (CYMBALTA) 60 MG capsule Take 1 capsule (60 mg total) by mouth 2 (two) times daily.  . furosemide (LASIX) 20 MG tablet Take 1 tablet (20 mg total) by mouth daily.  Marland Kitchen gabapentin (NEURONTIN) 300 MG capsule TAKE (1) CAPSULE TWICE DAILY.  Marland Kitchen glipiZIDE-metformin (METAGLIP) 5-500 MG tablet Take 2 tablets by mouth 2 (two) times daily before a meal.  . glucose blood (ONE TOUCH ULTRA TEST) test strip Test BID and prn  E11.42  . Insulin Glargine (TOUJEO SOLOSTAR) 300 UNIT/ML SOPN Inject 50 Units into the skin daily.  . Insulin Pen Needle (INSUPEN PEN NEEDLES) 32G X 4 MM MISC 1 each by Does not apply route daily.  Elmore Guise Devices (ONE TOUCH DELICA LANCING DEV) MISC 1 each by Does not apply route 2 (two) times daily. Dx   Y85.02  . oxyCODONE-acetaminophen (ROXICET) 5-325 MG tablet Take 1-2 tablets by mouth every 4 (four) hours as needed.   No facility-administered encounter medications on file as of 07/18/2016.    * Having back surgery in January due to DDD causing problems with  walking.  Hypertension  This is a chronic problem. The current episode started more than 1 year ago. The problem is resistant. Pertinent negatives include no chest pain, headaches, neck pain, palpitations or shortness of breath. Risk factors for coronary artery disease include diabetes mellitus, dyslipidemia, obesity, post-menopausal state and sedentary lifestyle. Past treatments include calcium channel blockers, beta blockers, diuretics and ACE inhibitors. The current treatment provides moderate improvement. Compliance problems include diet and exercise.  Hypertensive end-organ damage includes CAD/MI. There is no history of CVA.  Hyperlipidemia  This is a chronic problem. The current episode started more than 1 year ago. Recent lipid tests were reviewed and are variable. Associated symptoms include myalgias. Pertinent negatives include no chest pain or shortness of breath. She is currently on no antihyperlipidemic treatment (statins make her hurt refuses to take). The current treatment provides mild improvement of lipids. Compliance problems include adherence to diet and adherence to exercise.  Risk factors for coronary artery disease include diabetes mellitus, dyslipidemia, hypertension, obesity and post-menopausal.  Diabetes  She presents for her follow-up diabetic visit. She has type 2 diabetes mellitus. No MedicAlert identification noted. Her disease course has been fluctuating. Pertinent negatives for hypoglycemia include no headaches. Pertinent negatives for diabetes include no chest pain. There are no diabetic complications. Pertinent negatives for diabetic complications include no CVA. Risk factors for coronary artery  disease include diabetes mellitus, dyslipidemia, hypertension, obesity, post-menopausal and sedentary lifestyle. Current diabetic treatment includes oral agent (dual therapy) and insulin injections (patient very noncompliant with her meds- toujeo was increased to 50u daily. she did not  increase it until a month ago.). She is compliant with treatment some of the time. Her weight is stable. Diabetic current diet: very noncompliant with diet. When asked about meal planning, she reported none. She has not had a previous visit with a dietitian. She never participates in exercise. Her breakfast blood glucose is taken between 9-10 am. Her breakfast blood glucose range is generally 180-200 mg/dl. Her highest blood glucose is >200 mg/dl. Her overall blood glucose range is 140-180 mg/dl. (Does not check blood sugars very often) An ACE inhibitor/angiotensin II receptor blocker is being taken. She does not see a podiatrist.Eye exam is not current.  Fibromyalgia/periperal neuropathy Cymbalta working ok but would like to increase dose because still has achiness all over- she also takes neurotin which helps some. Neurotin helping some GERD Currently not taking anything- has occasional symptoms depending on what she eats  Review of Systems  Constitutional: Negative.   Eyes: Negative.   Respiratory: Negative.  Negative for shortness of breath.   Cardiovascular: Negative for chest pain and palpitations.  Genitourinary: Negative.   Musculoskeletal: Positive for arthralgias, back pain and myalgias. Negative for neck pain.  Neurological: Negative for headaches.       Objective:   Physical Exam  Constitutional: She is oriented to person, place, and time. She appears well-developed and well-nourished.  HENT:  Nose: Nose normal.  Mouth/Throat: Oropharynx is clear and moist.  Eyes: EOM are normal.  Neck: Trachea normal, normal range of motion and full passive range of motion without pain. Neck supple. No JVD present. Carotid bruit is not present. No thyromegaly present.  Cardiovascular: Normal rate, regular rhythm, normal heart sounds and intact distal pulses.  Exam reveals no gallop and no friction rub.   No murmur heard. Pulmonary/Chest: Effort normal and breath sounds normal.  Abdominal:  Soft. Bowel sounds are normal. She exhibits no distension and no mass. There is no tenderness.  Musculoskeletal: Normal range of motion.  Lymphadenopathy:    She has no cervical adenopathy.  Neurological: She is alert and oriented to person, place, and time. She has normal reflexes.  Skin: Skin is warm and dry.  Scalp scar tender on right- no edema- no discoloration  Psychiatric: She has a normal mood and affect. Her behavior is normal. Judgment and thought content normal.   BP (!) 148/84   Pulse 88   Temp 97.9 F (36.6 C) (Oral)   Ht _0  (1.575 m)   Wt 202 lb (91.6 kg)   BMI 36.95 kg/m     Hgba1c 10.0% up from 8.9%    Assessment & Plan:  1. Essential hypertension Low sodium diet - CMP14+EGFR - amLODipine (NORVASC) 5 MG tablet; Take 1 tablet (5 mg total) by mouth daily.  Dispense: 90 tablet; Refill: 1 - benazepril (LOTENSIN) 40 MG tablet; Take 1 tablet (40 mg total) by mouth daily before breakfast.  Dispense: 90 tablet; Refill: 1 - carvedilol (COREG) 25 MG tablet; TAKE  (1)  TABLET TWICE A DAY WITH MEALS (BREAKFAST AND SUPPER)  Dispense: 180 tablet; Refill: 1  2. Type 2 diabetes mellitus with diabetic polyneuropathy, with long-term current use of insulin (HCC) Do not decrease meds in futre- call and we will discuss options - Bayer DCA Hb A1c Waived - Microalbumin / creatinine urine  ratio - glipiZIDE-metformin (METAGLIP) 5-500 MG tablet; Take 2 tablets by mouth 2 (two) times daily before a meal.  Dispense: 360 tablet; Refill: 1 - gabapentin (NEURONTIN) 300 MG capsule; TAKE (1) CAPSULE TWICE DAILY.  Dispense: 60 capsule; Refill: 4 - DULoxetine (CYMBALTA) 60 MG capsule; Take 1 capsule (60 mg total) by mouth 2 (two) times daily.  Dispense: 180 capsule; Refill: 1 - Insulin Glargine (TOUJEO SOLOSTAR) 300 UNIT/ML SOPN; Inject 50 Units into the skin daily.  Dispense: 4.5 mL; Refill: 5  3. Hyperlipidemia with target LDL less than 100 Low fat diet - Lipid panel  4. Gastroesophageal  reflux disease without esophagitis Avoid spicy foods Do not eat 2 hours prior to bedtime  5. CKD (chronic kidney disease) stage 3, GFR 30-59 ml/min  6. Fibromyalgia Exercise encouraged  7. Morbid obesity ([place]) Discussed diet and exercise for person with BMI >25 Will recheck weight in 3-6 months  8. Peripheral edema Elevate legs when sitting - furosemide (LASIX) 20 MG tablet; Take 1 tablet (20 mg total) by mouth daily.  Dispense: 90 tablet; Refill: 1  9. Primary osteoarthritis of both knees - diclofenac (VOLTAREN) 75 MG EC tablet; TAKE (1) TABLET TWICE A DAY.  Dispense: 180 tablet; Refill: 1    Labs pending Health maintenance reviewed Diet and exercise encouraged Continue all meds Follow up  In 1 month to check hgba1c   Mary-Margaret Hassell Done, FNP

## 2016-07-18 NOTE — Patient Instructions (Signed)
Carbohydrate Counting for Diabetes Mellitus, Adult Carbohydrate counting is a method for keeping track of how many carbohydrates you eat. Eating carbohydrates naturally increases the amount of sugar (glucose) in the blood. Counting how many carbohydrates you eat helps keep your blood glucose within normal limits, which helps you manage your diabetes (diabetes mellitus). It is important to know how many carbohydrates you can safely have in each meal. This is different for every person. A diet and nutrition specialist (registered dietitian) can help you make a meal plan and calculate how many carbohydrates you should have at each meal and snack. Carbohydrates are found in the following foods:  Grains, such as breads and cereals.  Dried beans and soy products.  Starchy vegetables, such as potatoes, peas, and corn.  Fruit and fruit juices.  Milk and yogurt.  Sweets and snack foods, such as cake, cookies, candy, chips, and soft drinks. How do I count carbohydrates? There are two ways to count carbohydrates in food. You can use either of the methods or a combination of both. Reading "Nutrition Facts" on packaged food  The "Nutrition Facts" list is included on the labels of almost all packaged foods and beverages in the [place] It includes:  The serving size.  Information about nutrients in each serving, including the grams (g) of carbohydrate per serving. To use the "Nutrition Facts":  Decide how many servings you will have.  Multiply the number of servings by the number of carbohydrates per serving.  The resulting number is the total amount of carbohydrates that you will be having. Learning standard serving sizes of other foods  When you eat foods containing carbohydrates that are not packaged or do not include "Nutrition Facts" on the label, you need to measure the servings in order to count the amount of carbohydrates:  Measure the foods that you will eat with a food scale or measuring  cup, if needed.  Decide how many standard-size servings you will eat.  Multiply the number of servings by 15. Most carbohydrate-rich foods have about 15 g of carbohydrates per serving.  For example, if you eat 8 oz (170 g) of strawberries, you will have eaten 2 servings and 30 g of carbohydrates (2 servings x 15 g = 30 g).  For foods that have more than one food mixed, such as soups and casseroles, you must count the carbohydrates in each food that is included. The following list contains standard serving sizes of common carbohydrate-rich foods. Each of these servings has about 15 g of carbohydrates:   hamburger bun or  English muffin.   oz (15 mL) syrup.   oz (14 g) jelly.  1 slice of bread.  1 six-inch tortilla.  3 oz (85 g) cooked rice or pasta.  4 oz (113 g) cooked dried beans.  4 oz (113 g) starchy vegetable, such as peas, corn, or potatoes.  4 oz (113 g) hot cereal.  4 oz (113 g) mashed potatoes or  of a large baked potato.  4 oz (113 g) canned or frozen fruit.  4 oz (120 mL) fruit juice.  4-6 crackers.  6 chicken nuggets.  6 oz (170 g) unsweetened dry cereal.  6 oz (170 g) plain fat-free yogurt or yogurt sweetened with artificial sweeteners.  8 oz (240 mL) milk.  8 oz (170 g) fresh fruit or one small piece of fruit.  24 oz (680 g) popped popcorn. Example of carbohydrate counting Sample meal  3 oz (85 g) chicken breast.  6 oz (  170 g) brown rice.  4 oz (113 g) corn.  8 oz (240 mL) milk.  8 oz (170 g) strawberries with sugar-free whipped topping. Carbohydrate calculation 1. Identify the foods that contain carbohydrates:  Rice.  Corn.  Milk.  Strawberries. 2. Calculate how many servings you have of each food:  2 servings rice.  1 serving corn.  1 serving milk.  1 serving strawberries. 3. Multiply each number of servings by 15 g:  2 servings rice x 15 g = 30 g.  1 serving corn x 15 g = 15 g.  1 serving milk x 15 g = 15  g.  1 serving strawberries x 15 g = 15 g. 4. Add together all of the amounts to find the total grams of carbohydrates eaten:  30 g + 15 g + 15 g + 15 g = 75 g of carbohydrates total. This information is not intended to replace advice given to you by your health care provider. Make sure you discuss any questions you have with your health care provider. Document Released: 06/09/2005 Document Revised: 12/28/2015 Document Reviewed: 11/21/2015 Elsevier Interactive Patient Education  2017 [employer]  

## 2016-07-19 LAB — CMP14+EGFR
ALBUMIN: 4.1 g/dL (ref 3.5–4.8)
ALT: 34 IU/L — ABNORMAL HIGH (ref 0–32)
AST: 34 IU/L (ref 0–40)
Albumin/Globulin Ratio: 1.6 (ref 1.2–2.2)
Alkaline Phosphatase: 154 IU/L — ABNORMAL HIGH (ref 39–117)
BUN / CREAT RATIO: 14 (ref 12–28)
BUN: 19 mg/dL (ref 8–27)
Bilirubin Total: 0.3 mg/dL (ref 0.0–1.2)
CALCIUM: 9.9 mg/dL (ref 8.7–10.3)
CO2: 26 mmol/L (ref 18–29)
CREATININE: 1.32 mg/dL — AB (ref 0.57–1.00)
Chloride: 101 mmol/L (ref 96–106)
GFR, EST AFRICAN AMERICAN: 46 mL/min/{1.73_m2} — AB (ref >59)
GFR, EST NON AFRICAN AMERICAN: 40 mL/min/{1.73_m2} — AB (ref >59)
Globulin, Total: 2.6 g/dL (ref 1.5–4.5)
Glucose: 134 mg/dL — ABNORMAL HIGH (ref 65–99)
Potassium: 4.6 mmol/L (ref 3.5–5.2)
Sodium: 141 mmol/L (ref 134–144)
TOTAL PROTEIN: 6.7 g/dL (ref 6.0–8.5)

## 2016-07-19 LAB — LIPID PANEL
CHOL/HDL RATIO: 3.6 ratio (ref 0.0–4.4)
CHOLESTEROL TOTAL: 208 mg/dL — AB (ref 100–199)
HDL: 58 mg/dL (ref >39)
LDL CALC: 117 mg/dL — AB (ref 0–99)
Triglycerides: 163 mg/dL — ABNORMAL HIGH (ref 0–149)
VLDL CHOLESTEROL CAL: 33 mg/dL (ref 5–40)

## 2016-07-24 HISTORY — PX: BACK SURGERY: SHX140

## 2016-08-12 ENCOUNTER — Encounter (HOSPITAL_COMMUNITY)
Admission: RE | Admit: 2016-08-12 | Discharge: 2016-08-12 | Disposition: A | Discharge status: Home / Self Care | Payer: Medicare Other | Source: Ambulatory Visit | Attending: Neurosurgery | Admitting: Neurosurgery

## 2016-08-12 ENCOUNTER — Encounter (HOSPITAL_COMMUNITY): Payer: Self-pay

## 2016-08-12 DIAGNOSIS — Z794 Long term (current) use of insulin: Secondary | ICD-10-CM | POA: Diagnosis not present

## 2016-08-12 DIAGNOSIS — I10 Essential (primary) hypertension: Secondary | ICD-10-CM | POA: Diagnosis not present

## 2016-08-12 DIAGNOSIS — M797 Fibromyalgia: Secondary | ICD-10-CM | POA: Insufficient documentation

## 2016-08-12 DIAGNOSIS — Z01812 Encounter for preprocedural laboratory examination: Secondary | ICD-10-CM | POA: Insufficient documentation

## 2016-08-12 DIAGNOSIS — E119 Type 2 diabetes mellitus without complications: Secondary | ICD-10-CM | POA: Diagnosis not present

## 2016-08-12 DIAGNOSIS — Z01810 Encounter for preprocedural cardiovascular examination: Secondary | ICD-10-CM | POA: Diagnosis not present

## 2016-08-12 DIAGNOSIS — Z79899 Other long term (current) drug therapy: Secondary | ICD-10-CM | POA: Insufficient documentation

## 2016-08-12 HISTORY — DX: Failed or difficult intubation, initial encounter: T88.4XXA

## 2016-08-12 LAB — CBC
HCT: 38.1 % (ref 36.0–46.0)
Hemoglobin: 12.4 g/dL (ref 12.0–15.0)
MCH: 30.8 pg (ref 26.0–34.0)
MCHC: 32.5 g/dL (ref 30.0–36.0)
MCV: 94.5 fL (ref 78.0–100.0)
PLATELETS: 308 10*3/uL (ref 150–400)
RBC: 4.03 MIL/uL (ref 3.87–5.11)
RDW: 13.5 % (ref 11.5–15.5)
WBC: 6.9 10*3/uL (ref 4.0–10.5)

## 2016-08-12 LAB — BASIC METABOLIC PANEL
Anion gap: 9 (ref 5–15)
BUN: 20 mg/dL (ref 6–20)
CHLORIDE: 104 mmol/L (ref 101–111)
CO2: 27 mmol/L (ref 22–32)
CREATININE: 1.04 mg/dL — AB (ref 0.44–1.00)
Calcium: 10 mg/dL (ref 8.9–10.3)
GFR calc Af Amer: >60 mL/min (ref >60)
GFR calc non Af Amer: 52 mL/min — ABNORMAL LOW (ref >60)
Glucose, Bld: 129 mg/dL — ABNORMAL HIGH (ref 65–99)
Potassium: 4.2 mmol/L (ref 3.5–5.1)
SODIUM: 140 mmol/L (ref 135–145)

## 2016-08-12 LAB — TYPE AND SCREEN
ABO/RH(D): A POS
ANTIBODY SCREEN: NEGATIVE

## 2016-08-12 LAB — SURGICAL PCR SCREEN
MRSA, PCR: NEGATIVE
Staphylococcus aureus: NEGATIVE

## 2016-08-12 LAB — GLUCOSE, CAPILLARY: Glucose-Capillary: 119 mg/dL — ABNORMAL HIGH (ref 65–99)

## 2016-08-12 NOTE — Progress Notes (Signed)
pcp is Dr. Chevis Pretty, PA Denies ever seeing a cardiologist. Denies ever having a card cath or echo. States she had a stress test years ago. Reports her fasting CBG's run over 100, but states that she doesn't check them regularly.  Reports her last a1c was 11.0- order placed for a1c today. History of diff intubation Patricia Austin called and informed.

## 2016-08-12 NOTE — Pre-Procedure Instructions (Signed)
Patricia Austin  08/12/2016      [facility], [place] [facility] [place] [place] [address] Phone: [phone] Fax: [phone]    Your procedure is scheduled on Feb 28.  Report to [facility] Admitting at 600 A.M.  Call this number if you have problems the morning of surgery:  [phone]   Remember:  Do not eat food or drink liquids after midnight.  Take these medicines the morning of surgery with A SIP OF WATER  Carvedilol (coreg), Percocet if needed for pain  Stop taking aspirin, BC's, Goody's, Herbal medications, fish oil, Vitamins, Ibuprofen, Advil, Motrin, Aleve    How to Manage Your Diabetes Before and After Surgery  Why is it important to control my blood sugar before and after surgery? . Improving blood sugar levels before and after surgery helps healing and can limit problems. . A way of improving blood sugar control is eating a healthy diet by: o  Eating less sugar and carbohydrates o  Increasing activity/exercise o  Talking with your doctor about reaching your blood sugar goals . High blood sugars (greater than 180 mg/dL) can raise your risk of infections and slow your recovery, so you will need to focus on controlling your diabetes during the weeks before surgery. . Make sure that the doctor who takes care of your diabetes knows about your planned surgery including the date and location.  How do I manage my blood sugar before surgery? . Check your blood sugar at least 4 times a day, starting 2 days before surgery, to make sure that the level is not too high or low. o Check your blood sugar the morning of your surgery when you wake up and every 2 hours until you get to the Short Stay unit. . If your blood sugar is less than 70 mg/dL, you will need to treat for low blood sugar: o Do not take insulin. o Treat a low blood sugar (less than 70 mg/dL) with  cup of clear juice (cranberry or apple), 4 glucose tablets,  OR glucose gel. o Recheck blood sugar in 15 minutes after treatment (to make sure it is greater than 70 mg/dL). If your blood sugar is not greater than 70 mg/dL on recheck, call [phone] for further instructions. . Report your blood sugar to the short stay nurse when you get to Short Stay.  . If you are admitted to the hospital after surgery: o Your blood sugar will be checked by the staff and you will probably be given insulin after surgery (instead of oral diabetes medicines) to make sure you have good blood sugar levels. o The goal for blood sugar control after surgery is 80-180 mg/dL.       WHAT DO I DO ABOUT MY DIABETES MEDICATION?   Do not take oral diabetes medicines (pills) the morning of surgery. Glipizide-metformin (Metaglip) . THE NIGHT BEFORE SURGERY, take 25 units of Toujeo insulin.       Marland Kitchen HE MORNING OF SURGERY, take _____________ units of __________insulin.  . The day of surgery, do not take other diabetes injectables, including Byetta (exenatide), Bydureon (exenatide ER), Victoza (liraglutide), or Trulicity (dulaglutide).  . If your CBG is greater than 220 mg/dL, you may take  of your sliding scale (correction) dose of insulin.  Other Instructions:          Patient Signature:  Date:   Nurse Signature:  Date:   Reviewed and Endorsed by  [facility] Patient Education Committee, August 2015  Do not wear jewelry, make-up or nail polish.  Do not wear lotions, powders, or perfumes, or deoderant.  Do not shave 48 hours prior to surgery.  Men may shave face and neck.  Do not bring valuables to the hospital.  [facility] is not responsible for any belongings or valuables.  Contacts, dentures or bridgework may not be worn into surgery.  Leave your suitcase in the car.  After surgery it may be brought to your room.  For patients admitted to the hospital, discharge time will be determined by your treatment team.  Patients discharged the day of surgery will not  be allowed to drive home.    Special instructions:  [facility] - Preparing for Surgery  Before surgery, you can play an important role.  Because skin is not sterile, your skin needs to be as free of germs as possible.  You can reduce the number of germs on you skin by washing with CHG (chlorahexidine gluconate) soap before surgery.  CHG is an antiseptic cleaner which kills germs and bonds with the skin to continue killing germs even after washing.  Please DO NOT use if you have an allergy to CHG or antibacterial soaps.  If your skin becomes reddened/irritated stop using the CHG and inform your nurse when you arrive at Short Stay.  Do not shave (including legs and underarms) for at least 48 hours prior to the first CHG shower.  You may shave your face.  Please follow these instructions carefully:   1.  Shower with CHG Soap the night before surgery and the                                morning of Surgery.  2.  If you choose to wash your hair, wash your hair first as usual with your       normal shampoo.  3.  After you shampoo, rinse your hair and body thoroughly to remove the                      Shampoo.  4.  Use CHG as you would any other liquid soap.  You can apply chg directly       to the skin and wash gently with scrungie or a clean washcloth.  5.  Apply the CHG Soap to your body ONLY FROM THE NECK DOWN.        Do not use on open wounds or open sores.  Avoid contact with your eyes,       ears, mouth and genitals (private parts).  Wash genitals (private parts)       with your normal soap.  6.  Wash thoroughly, paying special attention to the area where your surgery        will be performed.  7.  Thoroughly rinse your body with warm water from the neck down.  8.  DO NOT shower/wash with your normal soap after using and rinsing off       the CHG Soap.  9.  Pat yourself dry with a clean towel.            10.  Wear clean pajamas.            11.  Place clean sheets on your bed the night of  your first shower and do not  sleep with pets.  Day of Surgery  Do not apply any lotions/deoderants the morning of surgery.  Please wear clean clothes to the hospital/surgery center.    Please read over the following fact sheets that you were given. Pain Booklet, Coughing and Deep Breathing, MRSA Information and Surgical Site Infection Prevention

## 2016-08-13 LAB — HEMOGLOBIN A1C
Hgb A1c MFr Bld: 10.5 % — ABNORMAL HIGH (ref 4.8–5.6)
MEAN PLASMA GLUCOSE: 255

## 2016-08-14 ENCOUNTER — Encounter (HOSPITAL_COMMUNITY): Payer: Self-pay

## 2016-08-14 NOTE — Progress Notes (Addendum)
Anesthesia Chart Review: Patient is a 71 year old female scheduled for L3-4, L4-5 PLIF on 08/20/16 by Dr. Christella Noa.  History includes DIFFICULT AIRWAY, never smoker, hypertension, GERD, dysrhythmia ("extra beat"), exertional dyspnea, fibromyalgia, diabetes mellitus type 2, esophageal dysmotility, chronic back pain, anxiety, depression, skin cancer excision (SCC), nephritis '55, CKD stage III, hysterectomy, appendectomy, tonsillectomy, redo L4-5 discectomy 10/23/10, left TKA 12/14/13. BMI is consistent with obesity.  In regards to her DIFFICULT AIRWAY history: - 12/14/13: IV induction. Mask ventilation without difficulty. Grade 2 view. 2 attempts to place 7.0 mm ET tube using bougie stylette, MAC and 3. Comments: DL 1 with MAC 3, CRNA unable to visualize vocal cord. Reduced neck mobility. AOI on 2nd DL with MAC3 & using bougie.   - 10/23/10: Easy bag/mask ventilation. Difficult airway due to prominent teeth, large tongue, reduced mouth opening, anterior larynx. Grade 3 view. DL 5 with glidescope. Unable to pass stylette. Ultimately 6.5 ETT secured using MAC 3. Unable to pass using glidescope. Induction with short-acting agent and alternative techniques readily available recommended for future anesthetics. - 05/29/03 [facility] [place] used to place 6.0 ETT (There was also difficultly passing ETT on 07/08/02. 2003 [facility] anesthesia records also sent. 2003-2004 anesthesia records from [facility] are on patient's hard chart for review as needed.)  PCP is Mary-Margaret Hassell Done, FNP. Last appointment 07/18/16. Patient had apparently not been taking DM meds as prescribed (due to bing in the "doughnut hole" and unable to afford refills). A1c was 11.0. Meds resumed. Patient has follow-up scheduled for 08/15/16.   Meds include amlodipine, aspirin 81 mg (on hold), benazepril, Coreg, Cymbalta, Lasix, Neurontin, glipizide-metformin, Toujeo, Roxicet.  BP (!) 159/68   Pulse 76   Temp 36.8 C   Resp 20   Ht _0  (1.6 m)   Wt 200 lb  9.6 oz (91 kg)   SpO2 96%   BMI 35.53 kg/m    EKG 08/12/16: NSR with sinus arrhythmia. She reported prior stress test, but many years ago.   Preoperative labs noted. Cr 1.04, CBC WNL. Glucose 129, although A1c 10.5 consistent with mean plasma glucose of 255 (routed to PCP). Since she was non-compliant with her DM regimen through December, it's difficult to know how much her A1c is reflective of this. Unfortunately, she does not check her home CBGs regularly and notes indicate that she is not very compliant with a diabetic diet either. Last month breakfast glucose range was generally 180-200. Voice message left with Nicki at Dr. Lacy Duverney office regarding A1c and scheduled follow-up with PCP tomorrow. Will plan to follow-up notes as available. I also asked Nicki to contact me with an update. If Dr. Christella Noa still wants to proceed as planned then she would still be at risk for cancellation if she arrived with a fasting glucose much over 200.   Known difficult airway. She would need GETA for this procedure. Several anesthesia records are available for review in Epic and also 2012 and 2003-2004 records are on her hard chart for review as needed.  George Hugh [facility] Short Stay Center/Anesthesiology Phone [phone] 08/14/2016 12:02 PM  Addendum: Patient was seen by Chevis Pretty, FNP on 08/15/16. Patient apparently non-compliant (or forgetful) with taking DM meds as prescribed. Toujeo increased and taking metformin and glipizide as prescribed reinforced. One month follow-up planned. I updated Nicki at Dr. Lacy Duverney office yesterday. She says Dr. Christella Noa plans to proceed understanding that case could be canceled if she arrives with significant hyperglycemia. Of note, Nicki stated that patient has  been itching for several days and was concerned that she may have shingles. No rash was documented at her 08/15/16 PCP visit. Discussed Nicki that if shingles were a concern that Infection Prevention  would need to be involved, and typically elective cases are postponed. She discussed with Dr. Christella Noa. He was NOT inclined to think that patient had shingles since no visible rash and also no indication for concern of shingles during her last PCP office visit. Further evaluation on the day of surgery. Definitive plan at that time based on glucose results and exam findings. I updated Maryjean Ka, RN Short Stay Surveyor, quantity. Dr. Christella Noa does not think patient has herpes zoster, but as a precaution she will assign patient to a negative pressure room until patient can be further evaluated face to face. If no concerns then proceed as planned if glucose is acceptable. If there are concerning exam findings then would consult Infection Prevention.   George Hugh [facility] Short Stay Center/Anesthesiology Phone [phone] 08/19/2016 10:55 AM

## 2016-08-14 NOTE — Anesthesia Preprocedure Evaluation (Addendum)
Anesthesia Evaluation  Patient identified by MRN, date of birth, ID band Patient awake    Reviewed: Allergy & Precautions, H&P , NPO status , Patient's Chart, lab work & pertinent test results  History of Anesthesia Complications (+) DIFFICULT AIRWAY  Airway Mallampati: III   Neck ROM: full    Dental   Pulmonary shortness of breath,    breath sounds clear to auscultation       Cardiovascular hypertension,  Rhythm:regular Rate:Normal     Neuro/Psych PSYCHIATRIC DISORDERS Anxiety Depression    GI/Hepatic GERD  ,  Endo/Other  diabetes, Type 2  Renal/GU      Musculoskeletal  (+) Arthritis , Fibromyalgia -  Abdominal   Peds  Hematology   Anesthesia Other Findings Pt was intubated in 2015 with MAC 3 laryngoscope and grade 2 view.  Easy mask ventilation was noted.  Reproductive/Obstetrics                            Anesthesia Physical Anesthesia Plan  ASA: II  Anesthesia Plan: General   Post-op Pain Management:    Induction: Intravenous  Airway Management Planned: Oral ETT  Additional Equipment:   Intra-op Plan:   Post-operative Plan: Extubation in OR  Informed Consent: I have reviewed the patients History and Physical, chart, labs and discussed the procedure including the risks, benefits and alternatives for the proposed anesthesia with the patient or authorized representative who has indicated his/her understanding and acceptance.     Plan Discussed with: CRNA, Anesthesiologist and Surgeon  Anesthesia Plan Comments:         Anesthesia Quick Evaluation

## 2016-08-15 ENCOUNTER — Encounter: Payer: Self-pay | Admitting: Nurse Practitioner

## 2016-08-15 ENCOUNTER — Ambulatory Visit (INDEPENDENT_AMBULATORY_CARE_PROVIDER_SITE_OTHER): Payer: Medicare Other | Admitting: Nurse Practitioner

## 2016-08-15 VITALS — BP 162/68 | HR 73 | Temp 97.0°F | Ht 63.0 in | Wt 202.0 lb

## 2016-08-15 DIAGNOSIS — E1142 Type 2 diabetes mellitus with diabetic polyneuropathy: Secondary | ICD-10-CM | POA: Diagnosis not present

## 2016-08-15 DIAGNOSIS — Z794 Long term (current) use of insulin: Secondary | ICD-10-CM | POA: Diagnosis not present

## 2016-08-15 DIAGNOSIS — L299 Pruritus, unspecified: Secondary | ICD-10-CM

## 2016-08-15 MED ORDER — INSULIN GLARGINE 300 UNIT/ML ~~LOC~~ SOPN: 60.0000 [IU] | PEN_INJECTOR | Freq: Every day | SUBCUTANEOUS | 5 refills | Status: DC

## 2016-08-15 MED ORDER — HYDROXYZINE HCL 10 MG PO TABS: 10.0000 mg | ORAL_TABLET | Freq: Three times a day (TID) | ORAL | 2 refills | Status: DC | PRN

## 2016-08-15 NOTE — Progress Notes (Signed)
   Subjective:    Patient ID: Patricia Austin, female    DOB: 28-Sep-1945, 71 y.o.   MRN: [id number]  HPI  Patient comes in today for diabetic follow up. She was seen on 07/18/16 and HGBA1c had gone up from 8.9 to 10%. She does not always do meds like she is suppose to and will adjust her injections to what she thinks she needs rather then taking it as rx. SHe refused medication changes at last visit and said she could get HGBA1c down on her own. She is suppose to have surgery om her back and had pre-op labs done, her hgba1c was 10.5%. She says that she forgets to take her second dose of metformin and glipizide almost every day.  * Patient c/o itching all over- started Saturday and won't go away- she denies any new saps or detergents- no new foods or drinks.  Review of Systems  Constitutional: Negative.   HENT: Negative.   Respiratory: Negative.   Cardiovascular: Negative.   Genitourinary: Negative.   Skin:       All over itching  Neurological: Negative.   Psychiatric/Behavioral: Negative.   All other systems reviewed and are negative.      Objective:   Physical Exam  Constitutional: She appears well-developed and well-nourished. No distress.  Cardiovascular: Normal rate.   Pulmonary/Chest: Effort normal and breath sounds normal.  Skin: Skin is warm.  Dry skin but no visible rash- patient is scratching throughout exam.    BP (!) 162/68   Pulse 73   Temp 97 F (36.1 C) (Oral)   Ht 5\' 3"  (1.6 m)   Wt 202 lb (91.6 kg)   BMI 35.78 kg/m       Assessment & Plan:  1. Itching *avoid scratching Showers if helps** - hydrOXYzine (ATARAX/VISTARIL) 10 MG tablet; Take 1 tablet (10 mg total) by mouth 3 (three) times daily as needed.  Dispense: 30 tablet; Refill: 2  2. Type 2 diabetes mellitus with diabetic polyneuropathy, with long-term current use of insulin (HCC) Increase toujeo to 55u for 3 days then to 0u daily Take metformin/glipizide 2x a day as rx Again discussed the importance of  getting blood sugars down  Recheck in 1 month - Insulin Glargine (TOUJEO SOLOSTAR) 300 UNIT/ML SOPN; Inject 60 Units into the skin daily.  Dispense: 6 pen; Refill: [place], FNP

## 2016-08-15 NOTE — Patient Instructions (Signed)
Carbohydrate Counting for Diabetes Mellitus, Adult Carbohydrate counting is a method for keeping track of how many carbohydrates you eat. Eating carbohydrates naturally increases the amount of sugar (glucose) in the blood. Counting how many carbohydrates you eat helps keep your blood glucose within normal limits, which helps you manage your diabetes (diabetes mellitus). It is important to know how many carbohydrates you can safely have in each meal. This is different for every person. A diet and nutrition specialist (registered dietitian) can help you make a meal plan and calculate how many carbohydrates you should have at each meal and snack. Carbohydrates are found in the following foods:  Grains, such as breads and cereals.  Dried beans and soy products.  Starchy vegetables, such as potatoes, peas, and corn.  Fruit and fruit juices.  Milk and yogurt.  Sweets and snack foods, such as cake, cookies, candy, chips, and soft drinks. How do I count carbohydrates? There are two ways to count carbohydrates in food. You can use either of the methods or a combination of both. Reading "Nutrition Facts" on packaged food  The "Nutrition Facts" list is included on the labels of almost all packaged foods and beverages in the [place] It includes:  The serving size.  Information about nutrients in each serving, including the grams (g) of carbohydrate per serving. To use the "Nutrition Facts":  Decide how many servings you will have.  Multiply the number of servings by the number of carbohydrates per serving.  The resulting number is the total amount of carbohydrates that you will be having. Learning standard serving sizes of other foods  When you eat foods containing carbohydrates that are not packaged or do not include "Nutrition Facts" on the label, you need to measure the servings in order to count the amount of carbohydrates:  Measure the foods that you will eat with a food scale or measuring  cup, if needed.  Decide how many standard-size servings you will eat.  Multiply the number of servings by 15. Most carbohydrate-rich foods have about 15 g of carbohydrates per serving.  For example, if you eat 8 oz (170 g) of strawberries, you will have eaten 2 servings and 30 g of carbohydrates (2 servings x 15 g = 30 g).  For foods that have more than one food mixed, such as soups and casseroles, you must count the carbohydrates in each food that is included. The following list contains standard serving sizes of common carbohydrate-rich foods. Each of these servings has about 15 g of carbohydrates:   hamburger bun or  English muffin.   oz (15 mL) syrup.   oz (14 g) jelly.  1 slice of bread.  1 six-inch tortilla.  3 oz (85 g) cooked rice or pasta.  4 oz (113 g) cooked dried beans.  4 oz (113 g) starchy vegetable, such as peas, corn, or potatoes.  4 oz (113 g) hot cereal.  4 oz (113 g) mashed potatoes or  of a large baked potato.  4 oz (113 g) canned or frozen fruit.  4 oz (120 mL) fruit juice.  4-6 crackers.  6 chicken nuggets.  6 oz (170 g) unsweetened dry cereal.  6 oz (170 g) plain fat-free yogurt or yogurt sweetened with artificial sweeteners.  8 oz (240 mL) milk.  8 oz (170 g) fresh fruit or one small piece of fruit.  24 oz (680 g) popped popcorn. Example of carbohydrate counting Sample meal  3 oz (85 g) chicken breast.  6 oz (  170 g) brown rice.  4 oz (113 g) corn.  8 oz (240 mL) milk.  8 oz (170 g) strawberries with sugar-free whipped topping. Carbohydrate calculation 1. Identify the foods that contain carbohydrates:  Rice.  Corn.  Milk.  Strawberries. 2. Calculate how many servings you have of each food:  2 servings rice.  1 serving corn.  1 serving milk.  1 serving strawberries. 3. Multiply each number of servings by 15 g:  2 servings rice x 15 g = 30 g.  1 serving corn x 15 g = 15 g.  1 serving milk x 15 g = 15  g.  1 serving strawberries x 15 g = 15 g. 4. Add together all of the amounts to find the total grams of carbohydrates eaten:  30 g + 15 g + 15 g + 15 g = 75 g of carbohydrates total. This information is not intended to replace advice given to you by your health care provider. Make sure you discuss any questions you have with your health care provider. Document Released: 06/09/2005 Document Revised: 12/28/2015 Document Reviewed: 11/21/2015 Elsevier Interactive Patient Education  2017 [employer]  

## 2016-08-19 ENCOUNTER — Encounter (HOSPITAL_COMMUNITY): Payer: Self-pay | Admitting: Certified Registered Nurse Anesthetist

## 2016-08-20 ENCOUNTER — Inpatient Hospital Stay (HOSPITAL_COMMUNITY): Payer: Medicare Other

## 2016-08-20 ENCOUNTER — Inpatient Hospital Stay (HOSPITAL_COMMUNITY): Payer: Medicare Other | Admitting: Vascular Surgery

## 2016-08-20 ENCOUNTER — Inpatient Hospital Stay (HOSPITAL_COMMUNITY): Payer: Medicare Other | Admitting: Certified Registered Nurse Anesthetist

## 2016-08-20 ENCOUNTER — Encounter (HOSPITAL_COMMUNITY)
Admission: RE | Disposition: A | Discharge status: Home Health | Payer: Self-pay | Source: Ambulatory Visit | Attending: Neurosurgery

## 2016-08-20 ENCOUNTER — Inpatient Hospital Stay (HOSPITAL_COMMUNITY)
Admission: RE | Admit: 2016-08-20 | Discharge: 2016-08-21 | DRG: 460 | Disposition: A | Discharge status: Home Health | Payer: Medicare Other | Source: Ambulatory Visit | Attending: Neurosurgery | Admitting: Neurosurgery

## 2016-08-20 ENCOUNTER — Encounter (HOSPITAL_COMMUNITY): Payer: Self-pay | Admitting: *Deleted

## 2016-08-20 DIAGNOSIS — M4316 Spondylolisthesis, lumbar region: Principal | ICD-10-CM

## 2016-08-20 DIAGNOSIS — M797 Fibromyalgia: Secondary | ICD-10-CM | POA: Diagnosis not present

## 2016-08-20 DIAGNOSIS — F329 Major depressive disorder, single episode, unspecified: Secondary | ICD-10-CM | POA: Diagnosis present

## 2016-08-20 DIAGNOSIS — E119 Type 2 diabetes mellitus without complications: Secondary | ICD-10-CM | POA: Diagnosis present

## 2016-08-20 DIAGNOSIS — Z7984 Long term (current) use of oral hypoglycemic drugs: Secondary | ICD-10-CM | POA: Diagnosis not present

## 2016-08-20 DIAGNOSIS — Z96659 Presence of unspecified artificial knee joint: Secondary | ICD-10-CM | POA: Diagnosis not present

## 2016-08-20 DIAGNOSIS — I10 Essential (primary) hypertension: Secondary | ICD-10-CM | POA: Diagnosis present

## 2016-08-20 DIAGNOSIS — I129 Hypertensive chronic kidney disease with stage 1 through stage 4 chronic kidney disease, or unspecified chronic kidney disease: Secondary | ICD-10-CM | POA: Diagnosis not present

## 2016-08-20 DIAGNOSIS — Z419 Encounter for procedure for purposes other than remedying health state, unspecified: Secondary | ICD-10-CM

## 2016-08-20 DIAGNOSIS — Z7982 Long term (current) use of aspirin: Secondary | ICD-10-CM

## 2016-08-20 DIAGNOSIS — Z981 Arthrodesis status: Secondary | ICD-10-CM | POA: Diagnosis not present

## 2016-08-20 DIAGNOSIS — M4326 Fusion of spine, lumbar region: Secondary | ICD-10-CM | POA: Diagnosis not present

## 2016-08-20 DIAGNOSIS — Z79899 Other long term (current) drug therapy: Secondary | ICD-10-CM | POA: Diagnosis not present

## 2016-08-20 DIAGNOSIS — F419 Anxiety disorder, unspecified: Secondary | ICD-10-CM | POA: Diagnosis present

## 2016-08-20 DIAGNOSIS — M51369 Other intervertebral disc degeneration, lumbar region without mention of lumbar back pain or lower extremity pain: Secondary | ICD-10-CM

## 2016-08-20 DIAGNOSIS — N183 Chronic kidney disease, stage 3 (moderate): Secondary | ICD-10-CM | POA: Diagnosis not present

## 2016-08-20 DIAGNOSIS — M5136 Other intervertebral disc degeneration, lumbar region: Secondary | ICD-10-CM

## 2016-08-20 LAB — CBC
HEMATOCRIT: 31.8 % — AB (ref 36.0–46.0)
HEMOGLOBIN: 10.2 g/dL — AB (ref 12.0–15.0)
MCH: 30.2 pg (ref 26.0–34.0)
MCHC: 32.1 g/dL (ref 30.0–36.0)
MCV: 94.1 fL (ref 78.0–100.0)
Platelets: 208 10*3/uL (ref 150–400)
RBC: 3.38 MIL/uL — ABNORMAL LOW (ref 3.87–5.11)
RDW: 14 % (ref 11.5–15.5)
WBC: 12.6 10*3/uL — ABNORMAL HIGH (ref 4.0–10.5)

## 2016-08-20 LAB — GLUCOSE, CAPILLARY
GLUCOSE-CAPILLARY: 72 mg/dL (ref 65–99)
Glucose-Capillary: 79 mg/dL (ref 65–99)
Glucose-Capillary: 96 mg/dL (ref 65–99)

## 2016-08-20 LAB — CREATININE, SERUM
Creatinine, Ser: 1.18 mg/dL — ABNORMAL HIGH (ref 0.44–1.00)
GFR, EST AFRICAN AMERICAN: 52 mL/min — AB (ref >60)
GFR, EST NON AFRICAN AMERICAN: 45 mL/min — AB (ref >60)

## 2016-08-20 SURGERY — POSTERIOR LUMBAR FUSION 2 LEVEL
Anesthesia: General

## 2016-08-20 MED ORDER — DEXTROSE 5 % IV SOLN
INTRAVENOUS | Status: DC | PRN
  Administered 2016-08-20 (×2): 40 ug/min via INTRAVENOUS

## 2016-08-20 MED ORDER — ACETAMINOPHEN 650 MG RE SUPP: 650.0000 mg | RECTAL | Status: DC | PRN

## 2016-08-20 MED ORDER — PROPOFOL 10 MG/ML IV BOLUS
INTRAVENOUS | Status: DC | PRN
  Administered 2016-08-20: 130 mg via INTRAVENOUS

## 2016-08-20 MED ORDER — CARVEDILOL 12.5 MG PO TABS: 25.0000 mg | ORAL_TABLET | Freq: Two times a day (BID) | ORAL | Status: DC

## 2016-08-20 MED ORDER — LIDOCAINE-EPINEPHRINE 0.5 %-1:200000 IJ SOLN
INTRAMUSCULAR | Status: DC | PRN
  Administered 2016-08-20: 20 mL

## 2016-08-20 MED ORDER — LIDOCAINE-EPINEPHRINE (PF) 2 %-1:200000 IJ SOLN
INTRAMUSCULAR | Status: AC
  Filled 2016-08-20: qty 20

## 2016-08-20 MED ORDER — FENTANYL CITRATE (PF) 100 MCG/2ML IJ SOLN
INTRAMUSCULAR | Status: AC
  Filled 2016-08-20: qty 2

## 2016-08-20 MED ORDER — POTASSIUM CHLORIDE IN NACL 20-0.9 MEQ/L-% IV SOLN
INTRAVENOUS | Status: DC
  Administered 2016-08-20: 22:00:00 via INTRAVENOUS
  Filled 2016-08-20 (×2): qty 1000

## 2016-08-20 MED ORDER — ONDANSETRON HCL 4 MG/2ML IJ SOLN
INTRAMUSCULAR | Status: AC
  Filled 2016-08-20: qty 4

## 2016-08-20 MED ORDER — PHENYLEPHRINE HCL 10 MG/ML IJ SOLN
INTRAMUSCULAR | Status: DC | PRN
  Administered 2016-08-20 (×2): 80 ug via INTRAVENOUS

## 2016-08-20 MED ORDER — HYDROMORPHONE HCL 1 MG/ML IJ SOLN
0.2500 mg | INTRAMUSCULAR | Status: DC | PRN
  Administered 2016-08-20 (×2): 0.5 mg via INTRAVENOUS

## 2016-08-20 MED ORDER — HYDROMORPHONE HCL 1 MG/ML IJ SOLN: 1.0000 mg | INTRAMUSCULAR | Status: DC | PRN

## 2016-08-20 MED ORDER — ONDANSETRON HCL 4 MG/2ML IJ SOLN
INTRAMUSCULAR | Status: DC | PRN
  Administered 2016-08-20 (×2): 4 mg via INTRAVENOUS

## 2016-08-20 MED ORDER — VITAMIN D3 25 MCG (1000 UNIT) PO TABS
5000.0000 [IU] | ORAL_TABLET | Freq: Every day | ORAL | Status: DC
  Administered 2016-08-21: 5000 [IU] via ORAL
  Filled 2016-08-20 (×2): qty 5

## 2016-08-20 MED ORDER — DOCUSATE SODIUM 100 MG PO CAPS
100.0000 mg | ORAL_CAPSULE | Freq: Two times a day (BID) | ORAL | Status: DC
  Administered 2016-08-20 – 2016-08-21 (×2): 100 mg via ORAL
  Filled 2016-08-20 (×2): qty 1

## 2016-08-20 MED ORDER — SODIUM CHLORIDE 0.9% FLUSH: 3.0000 mL | Freq: Two times a day (BID) | INTRAVENOUS | Status: DC

## 2016-08-20 MED ORDER — BUPIVACAINE HCL (PF) 0.5 % IJ SOLN
INTRAMUSCULAR | Status: AC
  Filled 2016-08-20: qty 30

## 2016-08-20 MED ORDER — LACTATED RINGERS IV SOLN
Freq: Once | INTRAVENOUS | Status: AC
  Administered 2016-08-20: 08:00:00 via INTRAVENOUS

## 2016-08-20 MED ORDER — ROCURONIUM BROMIDE 50 MG/5ML IV SOSY
PREFILLED_SYRINGE | INTRAVENOUS | Status: AC
  Filled 2016-08-20: qty 5

## 2016-08-20 MED ORDER — HYDROMORPHONE HCL 2 MG/ML IJ SOLN
INTRAMUSCULAR | Status: AC
  Filled 2016-08-20: qty 1

## 2016-08-20 MED ORDER — ASPIRIN EC 81 MG PO TBEC
81.0000 mg | DELAYED_RELEASE_TABLET | Freq: Every day | ORAL | Status: DC
  Administered 2016-08-21: 81 mg via ORAL
  Filled 2016-08-20: qty 1

## 2016-08-20 MED ORDER — GLIPIZIDE 5 MG PO TABS
10.0000 mg | ORAL_TABLET | Freq: Two times a day (BID) | ORAL | Status: DC
  Administered 2016-08-21 (×2): 10 mg via ORAL
  Filled 2016-08-20 (×2): qty 2

## 2016-08-20 MED ORDER — DEXAMETHASONE SODIUM PHOSPHATE 4 MG/ML IJ SOLN
INTRAMUSCULAR | Status: DC | PRN
  Administered 2016-08-20: 10 mg via INTRAVENOUS

## 2016-08-20 MED ORDER — SUGAMMADEX SODIUM 200 MG/2ML IV SOLN
INTRAVENOUS | Status: DC | PRN
  Administered 2016-08-20: 100 mg via INTRAVENOUS

## 2016-08-20 MED ORDER — BISACODYL 5 MG PO TBEC: 5.0000 mg | DELAYED_RELEASE_TABLET | Freq: Every day | ORAL | Status: DC | PRN

## 2016-08-20 MED ORDER — FENTANYL CITRATE (PF) 100 MCG/2ML IJ SOLN
INTRAMUSCULAR | Status: AC
  Filled 2016-08-20: qty 4

## 2016-08-20 MED ORDER — MENTHOL 3 MG MT LOZG
1.0000 | LOZENGE | OROMUCOSAL | Status: DC | PRN
  Filled 2016-08-20 (×3): qty 9

## 2016-08-20 MED ORDER — SODIUM CHLORIDE 0.9% FLUSH
3.0000 mL | INTRAVENOUS | Status: DC | PRN
Start: 2016-08-20 — End: 2016-08-21

## 2016-08-20 MED ORDER — ONDANSETRON HCL 4 MG/2ML IJ SOLN: 4.0000 mg | Freq: Four times a day (QID) | INTRAMUSCULAR | Status: DC | PRN

## 2016-08-20 MED ORDER — ZOLPIDEM TARTRATE 5 MG PO TABS: 5.0000 mg | ORAL_TABLET | Freq: Every evening | ORAL | Status: DC | PRN

## 2016-08-20 MED ORDER — OXYCODONE HCL 5 MG PO TABS: 5.0000 mg | ORAL_TABLET | Freq: Once | ORAL | Status: DC | PRN

## 2016-08-20 MED ORDER — INSULIN GLARGINE 100 UNIT/ML ~~LOC~~ SOLN
60.0000 [IU] | Freq: Every day | SUBCUTANEOUS | Status: DC
  Administered 2016-08-21: 60 [IU] via SUBCUTANEOUS
  Filled 2016-08-20: qty 0.6

## 2016-08-20 MED ORDER — VITAMIN B-12 1000 MCG PO TABS: 5000.0000 ug | ORAL_TABLET | ORAL | Status: DC

## 2016-08-20 MED ORDER — THROMBIN 20000 UNITS EX SOLR
CUTANEOUS | Status: DC | PRN
  Administered 2016-08-20: 09:00:00 via TOPICAL

## 2016-08-20 MED ORDER — MIDAZOLAM HCL 2 MG/2ML IJ SOLN
INTRAMUSCULAR | Status: AC
  Filled 2016-08-20: qty 2

## 2016-08-20 MED ORDER — LACTATED RINGERS IV SOLN
INTRAVENOUS | Status: DC | PRN
  Administered 2016-08-20 (×4): via INTRAVENOUS

## 2016-08-20 MED ORDER — KETOROLAC TROMETHAMINE 15 MG/ML IJ SOLN
15.0000 mg | Freq: Four times a day (QID) | INTRAMUSCULAR | Status: AC
  Administered 2016-08-20 – 2016-08-21 (×3): 15 mg via INTRAVENOUS
  Filled 2016-08-20 (×3): qty 1

## 2016-08-20 MED ORDER — ACETAMINOPHEN 325 MG PO TABS: 650.0000 mg | ORAL_TABLET | Freq: Four times a day (QID) | ORAL | Status: DC | PRN

## 2016-08-20 MED ORDER — BUPIVACAINE HCL (PF) 0.5 % IJ SOLN
INTRAMUSCULAR | Status: DC | PRN
  Administered 2016-08-20: 25 mL

## 2016-08-20 MED ORDER — VITAMIN B-12 5000 MCG SL SUBL: 5000.0000 ug | SUBLINGUAL_TABLET | SUBLINGUAL | Status: DC

## 2016-08-20 MED ORDER — ONDANSETRON HCL 4 MG/2ML IJ SOLN: 4.0000 mg | INTRAMUSCULAR | Status: DC | PRN

## 2016-08-20 MED ORDER — GLIPIZIDE-METFORMIN HCL 5-500 MG PO TABS: 2.0000 | ORAL_TABLET | Freq: Two times a day (BID) | ORAL | Status: DC

## 2016-08-20 MED ORDER — ACETAMINOPHEN 325 MG PO TABS: 650.0000 mg | ORAL_TABLET | ORAL | Status: DC | PRN

## 2016-08-20 MED ORDER — HYDROXYZINE HCL 10 MG PO TABS
10.0000 mg | ORAL_TABLET | Freq: Three times a day (TID) | ORAL | Status: DC | PRN
  Administered 2016-08-20: 10 mg via ORAL
  Filled 2016-08-20: qty 1

## 2016-08-20 MED ORDER — CHLORHEXIDINE GLUCONATE CLOTH 2 % EX PADS: 6.0000 | MEDICATED_PAD | Freq: Once | CUTANEOUS | Status: DC

## 2016-08-20 MED ORDER — THROMBIN 20000 UNITS EX SOLR
CUTANEOUS | Status: AC
  Filled 2016-08-20: qty 20000

## 2016-08-20 MED ORDER — ACETAMINOPHEN 650 MG RE SUPP: 650.0000 mg | Freq: Four times a day (QID) | RECTAL | Status: DC | PRN

## 2016-08-20 MED ORDER — HEPARIN SODIUM (PORCINE) 5000 UNIT/ML IJ SOLN: 5000.0000 [IU] | Freq: Three times a day (TID) | INTRAMUSCULAR | Status: DC

## 2016-08-20 MED ORDER — MAGNESIUM CITRATE PO SOLN: 1.0000 | Freq: Once | ORAL | Status: DC | PRN

## 2016-08-20 MED ORDER — EPHEDRINE SULFATE 50 MG/ML IJ SOLN
INTRAMUSCULAR | Status: DC | PRN
  Administered 2016-08-20: 10 mg via INTRAVENOUS
  Administered 2016-08-20 (×2): 5 mg via INTRAVENOUS
  Administered 2016-08-20: 10 mg via INTRAVENOUS

## 2016-08-20 MED ORDER — PHENOL 1.4 % MT LIQD
1.0000 | OROMUCOSAL | Status: DC | PRN
Start: 2016-08-20 — End: 2016-08-21

## 2016-08-20 MED ORDER — SUGAMMADEX SODIUM 200 MG/2ML IV SOLN
INTRAVENOUS | Status: AC
  Filled 2016-08-20: qty 2

## 2016-08-20 MED ORDER — FUROSEMIDE 20 MG PO TABS
20.0000 mg | ORAL_TABLET | ORAL | Status: DC
  Administered 2016-08-21: 20 mg via ORAL
  Filled 2016-08-20: qty 1

## 2016-08-20 MED ORDER — OXYCODONE HCL 5 MG PO TABS
5.0000 mg | ORAL_TABLET | ORAL | Status: DC | PRN
  Administered 2016-08-20 – 2016-08-21 (×3): 5 mg via ORAL
  Filled 2016-08-20 (×3): qty 1

## 2016-08-20 MED ORDER — BENAZEPRIL HCL 20 MG PO TABS
40.0000 mg | ORAL_TABLET | Freq: Every day | ORAL | Status: DC
  Administered 2016-08-20: 40 mg via ORAL
  Filled 2016-08-20: qty 2

## 2016-08-20 MED ORDER — LIDOCAINE HCL (CARDIAC) 20 MG/ML IV SOLN
INTRAVENOUS | Status: DC | PRN
  Administered 2016-08-20: 60 mg via INTRAVENOUS

## 2016-08-20 MED ORDER — 0.9 % SODIUM CHLORIDE (POUR BTL) OPTIME
TOPICAL | Status: DC | PRN
  Administered 2016-08-20 (×2): 1000 mL

## 2016-08-20 MED ORDER — OXYCODONE HCL 5 MG/5ML PO SOLN: 5.0000 mg | Freq: Once | ORAL | Status: DC | PRN

## 2016-08-20 MED ORDER — PROPOFOL 10 MG/ML IV BOLUS
INTRAVENOUS | Status: AC
  Filled 2016-08-20: qty 20

## 2016-08-20 MED ORDER — DIAZEPAM 5 MG PO TABS: 5.0000 mg | ORAL_TABLET | Freq: Four times a day (QID) | ORAL | Status: DC | PRN

## 2016-08-20 MED ORDER — SENNOSIDES-DOCUSATE SODIUM 8.6-50 MG PO TABS: 1.0000 | ORAL_TABLET | Freq: Every evening | ORAL | Status: DC | PRN

## 2016-08-20 MED ORDER — ALBUMIN HUMAN 5 % IV SOLN
INTRAVENOUS | Status: DC | PRN
  Administered 2016-08-20 (×2): via INTRAVENOUS

## 2016-08-20 MED ORDER — SODIUM CHLORIDE 0.9 % IV SOLN: 250.0000 mL | INTRAVENOUS | Status: DC

## 2016-08-20 MED ORDER — SUCCINYLCHOLINE CHLORIDE 20 MG/ML IJ SOLN
INTRAMUSCULAR | Status: DC | PRN
  Administered 2016-08-20: 100 mg via INTRAVENOUS

## 2016-08-20 MED ORDER — MIDAZOLAM HCL 5 MG/5ML IJ SOLN
INTRAMUSCULAR | Status: DC | PRN
  Administered 2016-08-20: 2 mg via INTRAVENOUS

## 2016-08-20 MED ORDER — GLYCOPYRROLATE 0.2 MG/ML IJ SOLN
INTRAMUSCULAR | Status: DC | PRN
  Administered 2016-08-20: 0.2 mg via INTRAVENOUS

## 2016-08-20 MED ORDER — ROCURONIUM BROMIDE 100 MG/10ML IV SOLN
INTRAVENOUS | Status: DC | PRN
  Administered 2016-08-20: 50 mg via INTRAVENOUS

## 2016-08-20 MED ORDER — DULOXETINE HCL 60 MG PO CPEP
60.0000 mg | ORAL_CAPSULE | Freq: Every day | ORAL | Status: DC
  Administered 2016-08-20: 60 mg via ORAL
  Filled 2016-08-20: qty 1

## 2016-08-20 MED ORDER — FENTANYL CITRATE (PF) 100 MCG/2ML IJ SOLN
INTRAMUSCULAR | Status: DC | PRN
  Administered 2016-08-20 (×2): 50 ug via INTRAVENOUS
  Administered 2016-08-20 (×2): 100 ug via INTRAVENOUS

## 2016-08-20 MED ORDER — METFORMIN HCL 500 MG PO TABS
1000.0000 mg | ORAL_TABLET | Freq: Two times a day (BID) | ORAL | Status: DC
  Administered 2016-08-21 (×2): 1000 mg via ORAL
  Filled 2016-08-20 (×2): qty 2

## 2016-08-20 MED ORDER — AMLODIPINE BESYLATE 5 MG PO TABS
5.0000 mg | ORAL_TABLET | Freq: Every day | ORAL | Status: DC
  Administered 2016-08-20: 5 mg via ORAL
  Filled 2016-08-20: qty 1

## 2016-08-20 MED ORDER — CEFAZOLIN SODIUM-DEXTROSE 2-4 GM/100ML-% IV SOLN
2.0000 g | INTRAVENOUS | Status: AC
  Administered 2016-08-20: 2 g via INTRAVENOUS
  Filled 2016-08-20: qty 100

## 2016-08-20 MED ORDER — GABAPENTIN 300 MG PO CAPS
300.0000 mg | ORAL_CAPSULE | Freq: Every day | ORAL | Status: DC
  Administered 2016-08-20: 300 mg via ORAL
  Filled 2016-08-20: qty 1

## 2016-08-20 SURGICAL SUPPLY — 73 items
ADH SKN CLS APL DERMABOND .7 (GAUZE/BANDAGES/DRESSINGS) ×2
APL SKNCLS STERI-STRIP NONHPOA (GAUZE/BANDAGES/DRESSINGS)
BAG DECANTER FOR FLEXI CONT (MISCELLANEOUS) ×3 IMPLANT
BASKET BONE COLLECTION (BASKET) ×1 IMPLANT
BENZOIN TINCTURE PRP APPL 2/3 (GAUZE/BANDAGES/DRESSINGS) IMPLANT
BIT DRILL PLIF MAS DISP 5.5MM (DRILL) IMPLANT
BLADE CLIPPER SURG (BLADE) IMPLANT
BUR MATCHSTICK NEURO 3.0 LAGG (BURR) ×3 IMPLANT
BUR PRECISION FLUTE 5.0 (BURR) ×3 IMPLANT
CANISTER SUCT 3000ML PPV (MISCELLANEOUS) ×3 IMPLANT
CAP RELINE MOD TULIP RMM (Cap) ×12 IMPLANT
CARTRIDGE OIL MAESTRO DRILL (MISCELLANEOUS) ×1 IMPLANT
CLOSURE WOUND 1/2 X4 (GAUZE/BANDAGES/DRESSINGS)
CONT SPEC 4OZ CLIKSEAL STRL BL (MISCELLANEOUS) ×3 IMPLANT
COVER BACK TABLE 60X90IN (DRAPES) IMPLANT
DECANTER SPIKE VIAL GLASS SM (MISCELLANEOUS) ×3 IMPLANT
DERMABOND ADVANCED (GAUZE/BANDAGES/DRESSINGS) ×4
DERMABOND ADVANCED .7 DNX12 (GAUZE/BANDAGES/DRESSINGS) ×1 IMPLANT
DIFFUSER DRILL AIR PNEUMATIC (MISCELLANEOUS) ×3 IMPLANT
DRAPE C-ARM 42X72 X-RAY (DRAPES) ×6 IMPLANT
DRAPE C-ARMOR (DRAPES) IMPLANT
DRAPE LAPAROTOMY 100X72X124 (DRAPES) ×3 IMPLANT
DRAPE POUCH INSTRU U-SHP 10X18 (DRAPES) ×3 IMPLANT
DRAPE SURG 17X23 STRL (DRAPES) ×3 IMPLANT
DRILL PLIF MAS DISP 5.5MM (DRILL) ×3
DURAPREP 26ML APPLICATOR (WOUND CARE) ×3 IMPLANT
ELECT REM PT RETURN 9FT ADLT (ELECTROSURGICAL) ×3
ELECTRODE REM PT RTRN 9FT ADLT (ELECTROSURGICAL) ×1 IMPLANT
GAUZE SPONGE 4X4 12PLY STRL (GAUZE/BANDAGES/DRESSINGS) IMPLANT
GAUZE SPONGE 4X4 16PLY XRAY LF (GAUZE/BANDAGES/DRESSINGS) IMPLANT
GLOVE ECLIPSE 6.5 STRL STRAW (GLOVE) ×6 IMPLANT
GLOVE ECLIPSE 7.5 STRL STRAW (GLOVE) ×2 IMPLANT
GLOVE EXAM NITRILE LRG STRL (GLOVE) IMPLANT
GLOVE EXAM NITRILE XL STR (GLOVE) IMPLANT
GLOVE EXAM NITRILE XS STR PU (GLOVE) IMPLANT
GLOVE INDICATOR 7.0 STRL GRN (GLOVE) ×2 IMPLANT
GLOVE INDICATOR 7.5 STRL GRN (GLOVE) ×2 IMPLANT
GLOVE INDICATOR 8.0 STRL GRN (GLOVE) ×2 IMPLANT
GLOVE SURG SS PI 6.5 STRL IVOR (GLOVE) ×6 IMPLANT
GOWN STRL REUS W/ TWL LRG LVL3 (GOWN DISPOSABLE) ×2 IMPLANT
GOWN STRL REUS W/ TWL XL LVL3 (GOWN DISPOSABLE) IMPLANT
GOWN STRL REUS W/TWL 2XL LVL3 (GOWN DISPOSABLE) ×4 IMPLANT
GOWN STRL REUS W/TWL LRG LVL3 (GOWN DISPOSABLE) ×9
GOWN STRL REUS W/TWL XL LVL3 (GOWN DISPOSABLE) ×3
KIT BASIN OR (CUSTOM PROCEDURE TRAY) ×3 IMPLANT
KIT POSITION SURG JACKSON T1 (MISCELLANEOUS) ×3 IMPLANT
KIT ROOM TURNOVER OR (KITS) ×3 IMPLANT
MILL MEDIUM DISP (BLADE) ×2 IMPLANT
NDL HYPO 25X1 1.5 SAFETY (NEEDLE) ×1 IMPLANT
NDL SPNL 18GX3.5 QUINCKE PK (NEEDLE) IMPLANT
NEEDLE HYPO 25X1 1.5 SAFETY (NEEDLE) ×3 IMPLANT
NEEDLE SPNL 18GX3.5 QUINCKE PK (NEEDLE) ×3 IMPLANT
NS IRRIG 1000ML POUR BTL (IV SOLUTION) ×5 IMPLANT
OIL CARTRIDGE MAESTRO DRILL (MISCELLANEOUS) ×3
PACK LAMINECTOMY NEURO (CUSTOM PROCEDURE TRAY) ×3 IMPLANT
PAD ARMBOARD 7.5X6 YLW CONV (MISCELLANEOUS) ×9 IMPLANT
ROD RELINE COCR 5.0X60MM (Rod) ×2 IMPLANT
SCREW LOCK RSS 4.5/5.0MM (Screw) ×14 IMPLANT
SCREW SHANK RELINE MOD 5.5X35 (Screw) ×8 IMPLANT
SHANK RELINE MOD 5.5X40 (Screw) ×4 IMPLANT
SPACER OPAL 10X24 (Orthopedic Implant) ×2 IMPLANT
SPACER OPAL REVOLVE 10MM ×6 IMPLANT
SPONGE LAP 4X18 X RAY DECT (DISPOSABLE) IMPLANT
SPONGE SURGIFOAM ABS GEL 100 (HEMOSTASIS) ×3 IMPLANT
STRIP CLOSURE SKIN 1/2X4 (GAUZE/BANDAGES/DRESSINGS) IMPLANT
SUT PROLENE 6 0 BV (SUTURE) IMPLANT
SUT VIC AB 0 CT1 18XCR BRD8 (SUTURE) ×1 IMPLANT
SUT VIC AB 0 CT1 8-18 (SUTURE) ×6
SUT VIC AB 2-0 CT1 18 (SUTURE) ×3 IMPLANT
SUT VIC AB 3-0 SH 8-18 (SUTURE) ×7 IMPLANT
TOWEL GREEN STERILE (TOWEL DISPOSABLE) ×2 IMPLANT
TOWEL GREEN STERILE FF (TOWEL DISPOSABLE) ×3 IMPLANT
WATER STERILE IRR 1000ML POUR (IV SOLUTION) ×3 IMPLANT

## 2016-08-20 NOTE — H&P (Signed)
BP (!) 173/81   Pulse 73   Temp 98.2 F (36.8 C) (Oral)   Resp 20   SpO2 97%  Mrs. Gillooly is a former patient of mine whom I had taken to the operating room for a lumbar procedure in 2012. She presents today with a classic history of neurogenic claudication. She says if she stands or walks for any period of time, she is in severe pain into both lower extremities and buttocks. She had recently gone to [place] to see her sister, and another sister in [place], and on both occasions, it simply hurt too much for her to walk to the beach. She does better, and she says that this may sound crazy, if I am in the super market and I can push a shopping cart. Again I told her this is just a classic story for a neurogenic claudication. The other problem that she is having, is that she mentions significant tingling in her left forearm. She has had carpal tunnel in the right hand, carpal tunnel release in the right hand, she says this is not feeling the same. She has had no bowel or bladder dysfunction or weakness. She is 46-years of age. She says that the pain started three to four years ago, but has gotten worse over the last six or seven months. She does feel weakness in the legs. She says sitting down will relieve the pain almost immediately. REVIEW OF SYSTEMS: Review of systems is positive for cataracts, leg pain with walking, and sometimes at rest, back pain, and arm pain. PAST SURGICAL HISTORY: She has undergone a knee replacement in 11/2013. She had the discectomy performed by me.  FAMILY HISTORY: Family history not provided. SOCIAL HISTORY: She is right-handed. She does not smoke. She does not use alcohol. She does not use illicit drugs.  CURRENT MEDICATIONS: She takes Metformin, Cymbalta, Lasix, Voltaren, Neurontin, and Coreg.  PHYSICAL EXAMINATION: Vital signs, height 5 feet 3 inches, weight 213.8 pounds, temperature is 96.9 degrees Fahrenheit, blood pressure is 138/69, pulse is 84, respiratory rate is  12. On examination she is alert, oriented x4, and she answers all questions appropriately. Memory, language, attention span, and fund of knowledge is normal. Speech is clear, it is also fluent. She has symmetric facial sensation and movement. Hearing is intact to finger rub bilaterally. Uvula elevates intermittent the midline. Shoulder shrug is normal. Tongue protrudes in the midline. 2+ reflexes at biceps, triceps, brachioradialis, knees, and ankles. She has normal muscle tone, bulk, and coordination. Romberg test is negative.  IMPRESSION/PLAN: Mrs. Doi has been on antiinflammatories now for months, well over six. She has done some physical therapy, she has had absolutely no relief. I would like to obtain an MRI given the fact that she has classic signs and symptoms of neurogenic claudication. I will also have her go ahead and do an EMG of the left upper extremity. I did not obtain a Tinel's sign over the cubital tunnel on the left, or over the carpal tunnel, but it certainly sounds more peripheral than central. She has absolutely no neck pain.  Mrs. Janowski returned today so we could look at the MRI of the lumbar spine. She has a listhesis present at L3-4. She also has foraminal narrowing and stenosis at that level, along with severe facet arthropathy. She has the same at 4-5, except there is no listhesis. This is almost identical to the previous MRI, but it does show some progression. She does have some narrowing of the spinal  canal at L3-4, but by far the worst levels are 3-4 and 4-5.  We spoke at great length today 20 minutes. I went over her films with her, the rationale of doing both L3-4 and L4-5 since are both are listhesed, both are stenotic and there facet and ligamentous hypertrophy at each level. The risks and benefits of bleeding, infection, no relief, damage to the nerve roots, bowel or bladder dysfunction were discussed, this along with other risks. She understands and wishes to proceed.

## 2016-08-20 NOTE — Transfer of Care (Signed)
Immediate Anesthesia Transfer of Care Note  Patient: Patricia Austin  Procedure(s) Performed: Procedure(s): POSTERIOR LUMBAR INTERBODY FUSION LUMBAR THREE-FOUR, LUMBAR FOUR-FIVE (N/A)  Patient Location: PACU  Anesthesia Type:General  Level of Consciousness: patient cooperative, lethargic and responds to stimulation  Airway & Oxygen Therapy: Patient Spontanous Breathing and Patient connected to nasal cannula oxygen  Post-op Assessment: Report given to RN and Patient moving all extremities X 4  Post vital signs: Reviewed and stable  Last Vitals:  Vitals:   08/20/16 0753  BP: (!) 173/81  Pulse: 73  Resp: 20  Temp: 36.8 C    Last Pain:  Vitals:   08/20/16 0753  TempSrc: Oral  PainSc: 3       Patients Stated Pain Goal: 2 ([id number])  Complications: No apparent anesthesia complications

## 2016-08-20 NOTE — Op Note (Signed)
08/20/2016  3:47 PM  PATIENT:  Patricia Austin  71 y.o. female with stenosis caused by the listhesis at L3/4, and 4/5. She reports neurogenic claudication. 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, L4/5, LUMBAR REGION  POST-OPERATIVE DIAGNOSIS:  same PROCEDURE:  Procedure(s):Lumbar decompression L3, L4 nerve roots beyond the needed and necessary exposure for a PLIF POSTERIOR LUMBAR INTERBODY FUSION LUMBAR THREE-FOUR, LUMBAR FOUR-FIVE synthes cages, autograft morsels Segmental pedicle screw fixation L3-5, Nuvasive mas plif instrumentation  SURGEON:  Surgeon(s): Ashok Pall, MD Jovita Gamma, MD  ASSISTANTS:Nudelman,Robert  ANESTHESIA:   general  EBL:  Total I/O In: 3900 [I.V.:3200; Blood:200; IV Piggyback:500] Out: 750 [Urine:250; Blood:500]  BLOOD ADMINISTERED:none  CELL SAVER GIVEN:per nursing  COUNT:per nursing  DRAINS: none   SPECIMEN:  No Specimen  DICTATION: Patricia Austin is a 71 y.o. female whom was taken to the operating room intubated, and placed under a general anesthetic without difficulty. A foley catheter was placed under sterile conditions. She was positioned prone on a Jackson stable with all pressure points properly padded.  Her lumbar region was prepped and draped in a sterile manner. I infiltrated 20cc's 1/2%lidocaine/1:2000,000 strength epinephrine into the planned incision. I opened the skin with a 10 blade and took the incision down to the thoracolumbar fascia. I exposed the lamina of L2,3,4,and 5 in a subperiosteal fashion bilaterally. I confirmed my location with an intraoperative xray.  I placed self retaining retractors and started the decompression.  I decompressed the spinal canal via complete laminectomies of L3 and L4, and hemilaminectomy of L5. I performed inferior facetectomies of L3, and L4 bilaterally well beyond the necessary exposure to perform PLIF's at L3/4, and 4/5. I  removed bone and ligamentum flavum to decompress the spinal canal and the nerve roots laterally through the neural foraminae PLIF's were performed at 3/4, and 4/5 in the same fashion. I opened the disc space with a 15 blade then used a variety of instruments to remove the disc and prepare the space for the arthrodesis. I used curettes, rongeurs, punches, shavers for the disc space, and rasps in the discetomy. I measured the disc space and placed 80mm   Peek cages(Synthes) into the disc space(s), filled with autograft morsels. I also packed autograft around the cages into the disc space I decorticated the lateral bone at L3,4, and 5. I then placed autograft morsels on the decorticated surfaces to complete the posterolateral arthrodesis.  We placed pedicle screws at L3, 4, and 5, using fluoroscopic guidance. I drilled a pilot hole, then cannulated the pedicle with a drill at each site. We then tapped each pedicle, assessing each site for pedicle violations. No cutouts were appreciated. Screws (Nuvasive mas plif) were then placed at each site without difficulty. We attached rods and locking caps with the appropriate tools. The locking caps were secured with torque limited screwdrivers. Final films were performed and the final construct appeared to be in good position.  We closed the wound in a layered fashion. We approximated the thoracolumbar fascia, subcutaneous, and subcuticular planes with vicryl sutures. I used dermabond, and an occlusive bandage for a sterile dressing.     PLAN OF CARE: Admit to inpatient   PATIENT DISPOSITION:  PACU - hemodynamically stable.   Delay start of Pharmacological VTE agent (>24hrs) due to surgical blood loss or risk of bleeding:  yes

## 2016-08-20 NOTE — Progress Notes (Signed)
Inpatient Diabetes Program Recommendations  AACE/ADA: New Consensus Statement on Inpatient Glycemic Control (2015)  Target Ranges:  Prepandial:   less than 140 mg/dL      Peak postprandial:   less than 180 mg/dL (1-2 hours)      Critically ill patients:  140 - 180 mg/dL   Lab Results  Component Value Date   GLUCAP 72 08/20/2016   HGBA1C 10.5 (H) 08/12/2016    Review of Glycemic ControlResults for DEONA, HAGGAN (MRN [id number]) as of 08/20/2016 13:09  Ref. Range 08/20/2016 07:38  Glucose-Capillary Latest Ref Range: 65 - 99 mg/dL 72   Diabetes history: Type 2 diabetes Outpatient Diabetes medications: Toujeo 60 units daily, METAGLIP 5-500 mg 2 tablets bid Current orders for Inpatient glycemic control:  Patient currently in OR  Inpatient Diabetes Program Recommendations:    Note patient saw PCP prior to surgery on 08/15/16 due to elevated A1C of 10.5%.  According to PAT instructions, patient was to take Toujeo 25 units last PM prior to surgery and fasting blood sugar this morning was 72 mg/dL.   MD, please consider ordering atleast 1/2 of patient's home dose of basal insulin.  Consider Lantus 30 units daily, Novolog moderate correction tid with meals and HS, and Novolog meal coverage 4 units tid with meals per Glycemic control order set.  Will follow.  Thanks, Adah Perl, RN, BC-ADM Inpatient Diabetes Coordinator Pager [phone]

## 2016-08-20 NOTE — Anesthesia Procedure Notes (Signed)
Procedure Name: Intubation Date/Time: 08/20/2016 9:20 AM Performed by: Ollen Bowl Pre-anesthesia Checklist: Patient identified, Emergency Drugs available, Suction available, Patient being monitored and Timeout performed Patient Re-evaluated:Patient Re-evaluated prior to inductionOxygen Delivery Method: Circle system utilized and Simple face mask Preoxygenation: Pre-oxygenation with 100% oxygen Intubation Type: IV induction Ventilation: Mask ventilation without difficulty Laryngoscope Size: Glidescope and 3 Grade View: Grade I Tube type: Oral Tube size: 6.5 mm Number of attempts: 1 Airway Equipment and Method: Patient positioned with wedge pillow,  Stylet and Video-laryngoscopy Placement Confirmation: ETT inserted through vocal cords under direct vision,  positive ETCO2 and breath sounds checked- equal and bilateral Secured at: 22 cm Tube secured with: Tape Dental Injury: Teeth and Oropharynx as per pre-operative assessment  Difficulty Due To: Difficulty was anticipated and Difficult Airway- due to anterior larynx Future Recommendations: Recommend- induction with short-acting agent, and alternative techniques readily available Comments: Initial DL with MAC 3 to assess airway. Grade4 airway with anterior properties. Proceeded to intubation with glidescope as listed above

## 2016-08-20 NOTE — Progress Notes (Signed)
New Admission Note:  Arrival Method: strecher Mental Orientation: Alert  & Oriented Telemetry: n/a Assessment: Completed Skin: surgical incision on back  IV: Left wrist  Pain:0/10 Tubes:n/a Safety Measures: Safety Fall Prevention Plan was discussed Admission: Completed 5C20: Patient has been orientated to the room, unit and the staff. Family:  Orders have been reviewed and implemented. Will continue to monitor the patient. Call light has been placed within reach and bed alarm has been activated.   Arta Silence ,RN

## 2016-08-21 MED ORDER — CYCLOBENZAPRINE HCL 10 MG PO TABS: 10.0000 mg | ORAL_TABLET | Freq: Three times a day (TID) | ORAL | 0 refills | Status: DC | PRN

## 2016-08-21 MED ORDER — HYDROCODONE-ACETAMINOPHEN 7.5-325 MG PO TABS: 1.0000 | ORAL_TABLET | Freq: Four times a day (QID) | ORAL | 0 refills | Status: DC | PRN

## 2016-08-21 NOTE — Evaluation (Signed)
Occupational Therapy Evaluation Patient Details Name: SHIESHA MANCA MRN: [id number] DOB: [birth year] Today's Date: 08/21/2016    History of Present Illness 71 y.o. female s/p posterior lumbar interbody fusion L3-4 and L4-5.   Clinical Impression   Pt admitted with the above diagnoses and presents with below problem list. Pt will benefit from continued acute OT to address the below listed deficits and maximize independence with basic ADLs prior to d/c home. PTA pt was mostly independent with ADLs, though she report min A with LB dressing. Pt currently min guard with household distance functional mobility. Min A with transfer to regular height toilet, pt has 3n1 over toilet at home, placed 3n1 over toilet in her bathroom for future use while here.      Follow Up Recommendations  No OT follow up;Supervision - Intermittent    Equipment Recommendations  None recommended by OT    Recommendations for Other Services       Precautions / Restrictions Precautions Precautions: Back Precaution Booklet Issued: Yes (comment) Precaution Comments: educated on BAT precautions Restrictions Weight Bearing Restrictions: No      Mobility Bed Mobility               General bed mobility comments: Pt in chair  Transfers Overall transfer level: Needs assistance Equipment used: Rolling walker (2 wheeled) Transfers: Sit to/from Stand Sit to Stand: Supervision         General transfer comment: Pt required cueing for proper hand placement and mantaining back precautions by not bending.     Balance Overall balance assessment: History of Falls                                          ADL Overall ADL's : Needs assistance/impaired Eating/Feeding: Set up;Sitting   Grooming: Min guard;Standing   Upper Body Bathing: Sitting;Set up   Lower Body Bathing: Minimal assistance;Sit to/from stand   Upper Body Dressing : Set up;Sitting   Lower Body Dressing: Minimal  assistance;Sit to/from stand   Toilet Transfer: RW;Ambulation;Regular Toilet;Minimal Print production planner Details (indicate cue type and reason): uncontrolled speed of descent on to regular height toilet. 3n1 placed over toilet for future use.     Tub/ Shower Transfer: Walk-in shower;Min guard;Ambulation;3 in 1   Functional mobility during ADLs: Supervision/safety;Rolling walker General ADL Comments: Pt with baseline difficulty elevating feet to access for LB ADLs in seated position. Spouse assists with this at home. Pt familiar with strategies and availability of AE for LB ADLs. Completed toilet transfer as detailed above.      Vision         Perception     Praxis      Pertinent Vitals/Pain Pain Assessment: Faces Pain Score: 3  Faces Pain Scale: Hurts a little bit Pain Location: back Pain Descriptors / Indicators: Burning     Hand Dominance Right   Extremity/Trunk Assessment Upper Extremity Assessment Upper Extremity Assessment: Overall WFL for tasks assessed   Lower Extremity Assessment Lower Extremity Assessment: Defer to PT evaluation   Cervical / Trunk Assessment Cervical / Trunk Assessment: Normal   Communication Communication Communication: No difficulties   Cognition Arousal/Alertness: Awake/alert Behavior During Therapy: WFL for tasks assessed/performed Overall Cognitive Status: Within Functional Limits for tasks assessed                     General Comments  Pt stated that  she hasnt had a fall in a few months but sometimes she feels like her legs just give out and she falls. She states she is able to get up on her own if she does fall.  Educated patient on how to get in and out of the car to maintain precautions.    Exercises       Shoulder Instructions      Home Living Family/patient expects to be discharged to:: Private residence Living Arrangements: Spouse/significant other Available Help at Discharge: Family;Available 24  hours/day Type of Home: House Home Access: Stairs to enter [employer] of Steps: 4 Entrance Stairs-Rails: Left Home Layout: One level     Bathroom Shower/Tub: Occupational psychologist: Standard Bathroom Accessibility: Yes   Home Equipment: Environmental consultant - 4 wheels;Walker - 2 wheels;Cane - single point;Shower seat;Bedside commode   Additional Comments: BSC remains over the toilet in the bathroom to raise the height.       Prior Functioning/Environment Level of Independence: Needs assistance  Gait / Transfers Assistance Needed: none ADL's / Homemaking Assistance Needed: min A LB dressing. "my husband helps get (clothing) on my feet."            OT Problem List: Impaired balance (sitting and/or standing);Decreased knowledge of use of DME or AE;Decreased knowledge of precautions;Pain      OT Treatment/Interventions: Self-care/ADL training;DME and/or AE instruction;Therapeutic activities;Patient/family education;Balance training    OT Goals(Current goals can be found in the care plan section) Acute Rehab OT Goals Patient Stated Goal: To get back to independence OT Goal Formulation: With patient Time For Goal Achievement: 08/28/16 Potential to Achieve Goals: Good ADL Goals Pt Will Transfer to Toilet: with modified independence;ambulating (3n1 over toilet) Pt Will Perform Tub/Shower Transfer: Shower transfer;with supervision;ambulating;3 in 1;rolling walker Additional ADL Goal #1: Pt will be mod I with bed mobility to prepare for OOB ADLs.   OT Frequency: Min 2X/week   Barriers to D/C:            Co-evaluation              End of Session Equipment Utilized During Treatment: Rolling walker  Activity Tolerance: Patient tolerated treatment well Patient left: in chair;with call bell/phone within reach  OT Visit Diagnosis: Other abnormalities of gait and mobility (R26.89);Pain Pain - part of body:  (back)                ADL either performed or assessed  with clinical judgement  Time: [id number] OT Time Calculation (min): 20 min Charges:  OT General Charges $OT Visit: 1 Procedure OT Evaluation $OT Eval Low Complexity: 1 Procedure G-Codes:       Hortencia Pilar 08/21/2016, 12:10 PM

## 2016-08-21 NOTE — Discharge Instructions (Signed)

## 2016-08-21 NOTE — Anesthesia Postprocedure Evaluation (Signed)
Anesthesia Post Note  Patient: Patricia Austin  Procedure(s) Performed: Procedure(s) (LRB): POSTERIOR LUMBAR INTERBODY FUSION LUMBAR THREE-FOUR, LUMBAR FOUR-FIVE (N/A)  Patient location during evaluation: PACU Anesthesia Type: General Level of consciousness: awake and alert and patient cooperative Pain management: pain level controlled Vital Signs Assessment: post-procedure vital signs reviewed and stable Respiratory status: spontaneous breathing and respiratory function stable Cardiovascular status: stable Anesthetic complications: no       Last Vitals:  Vitals:   08/21/16 0039 08/21/16 0607  BP: (!) 152/53 (!) 138/49  Pulse: (!) 101 (!) 107  Resp: 18 18  Temp: 36.9 C 37.4 C    Last Pain:  Vitals:   08/21/16 0718  TempSrc:   PainSc: 0-No pain                 Arush Gatliff S

## 2016-08-21 NOTE — Care Management Note (Signed)
Case Management Note  Patient Details  Name: Patricia Austin MRN: [id number] Date of Birth: 07-22-1945  Subjective/Objective:      Patient was admitted for a 2-level PLIF.  Lives at home with spouse. CM will follow for discharge needs pending PT/OT evals and physician orders.        Action/Plan:   Expected Discharge Date:  08/23/16               Expected Discharge Plan:     In-House Referral:     Discharge planning Services     Post Acute Care Choice:    Choice offered to:     DME Arranged:    DME Agency:     HH Arranged:    HH Agency:     Status of Service:     If discussed at [employer], dates discussed:    Additional Comments:  Rolm Baptise, RN 08/21/2016, 9:44 AM

## 2016-08-21 NOTE — Op Note (Signed)
08/20/2016  7:05 PM  PATIENT:  Patricia Austin  71 y.o. female with lumbar stenosis and lower extremity pain . 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  BLOOD ADMINISTERED:none  CELL SAVER GIVEN:per nursing   COUNT:per nursing  DRAINS: none   SPECIMEN:  No Specimen  DICTATION: [name] is a 71 y.o. female whom was taken to the operating room intubated, and placed under a general anesthetic without difficulty. A foley catheter was placed under sterile conditions. She was positioned prone on a Jackson stable with all pressure points properly padded. Her lumbar region was prepped and draped in a sterile manner. I infiltrated 20cc's 1/2%lidocaine/1:2000,000 strength epinephrine into the planned incision. I opened the skin with a 10 blade and took the incision down to the thoracolumbar fascia. I exposed the lamina of L2,3,4,and 5 in a subperiosteal fashion bilaterally. I confirmed my location with an intraoperative xray.  I placed self retaining retractors and started the decompression.  I decompressed the spinal canal via complete laminectomies of L3, and L4 and inferior facetectomies of L3, and L4. I removed enough bone so the the L3 and L4 roots had free egress from the spinal canal. The decompression was well beyond the needed exposure for a plif. I used  the drill and Kerrison punches to effect the bone removal and subsequent ligamentum flavum removal.  PLIF's were performed at 3/4, and 4/5 in the same fashion. I opened the disc space with a 15 blade then used a variety of instruments to remove the disc and prepare the space for the arthrodesis. I used curettes, rongeurs, punches, shavers for the disc space, and rasps in the discetomy. I measured the disc space and placed 80mm  Peek cages(Synthes) into the disc space(s).  I decorticated the lateral bone at L3,4, and 5. I then placed autograft morsels on the decorticated surfaces to complete the posterolateral arthrodesis with Dr. Donnella Bi assistance.  We placed pedicle screws at 3,4, and 5, using fluoroscopic guidance. I drilled a pilot hole, then cannulated the pedicle with a drill at each site. I then tapped each pedicle, assessing each site for pedicle violations. No cutouts were appreciated. Screws Harlin Heys) were then placed at each site without difficulty. We attached rods and locking caps with the appropriate tools. The locking caps were secured with torque limited screwdrivers. Final films were performed and the final construct appeared to be in good position.  We closed the wound in a layered fashion. We approximated the thoracolumbar fascia, subcutaneous, and subcuticular planes with vicryl sutures. I used dermabond for a sterile dressing.     PLAN OF CARE: Admit to inpatient   PATIENT DISPOSITION:  PACU - hemodynamically stable.   Delay start of Pharmacological VTE agent (>24hrs) due to surgical blood loss or risk of bleeding:  yes

## 2016-08-21 NOTE — Clinical Social Work Note (Addendum)
CSW consulted for SNF placement. P/T recommending home health. No O/T follow up recommended. RNCM will continue to follow for d/c needs. CSW signing off, as no further needs identified. Reconsult if new needs arise.   Patricia Austin, [place], [place] Work [phone]

## 2016-08-21 NOTE — Evaluation (Signed)
Physical Therapy Evaluation Patient Details Name: Patricia Austin MRN: [id number] DOB: 10/24/1945 Today's Date: 08/21/2016   History of Present Illness  71 y.o. female s/p posterior lumbar interbody fusion L3-4 and L4-5.  Clinical Impression  Pt presented with above dx. Pt required min assist to put on underwear in order to maintain back precautions. She lives at home with her husband who she states will be there to help her with ADLs. Pt requires supervision while ambulating and walking up stairs to maintain back precautions. Pt states that she has had multiple falls in the past year but is unsure how falls occur. Pt was educated on how to ambulate on stairs with appropriate precaution and how her husband can help her with safety. Pt requires follow up skilled physical therapy to address safe use of DME and proper stair negotiation for patient safety. Recommend d/c to home with Home Health PT to address safety concerns in the home and pt's fall risk.   Follow Up Recommendations Home health PT (to address safety in the home due to self reported falls)    Equipment Recommendations  None recommended by PT    Recommendations for Other Services       Precautions / Restrictions Precautions Precautions: Back Precaution Comments: educated on BAT precautions Restrictions Weight Bearing Restrictions: No      Mobility  Bed Mobility               General bed mobility comments: Pt in chair  Transfers Overall transfer level: Needs assistance Equipment used: Rolling walker (2 wheeled) Transfers: Sit to/from Stand Sit to Stand: Supervision         General transfer comment: Pt required cueing for proper hand placement and mantaining back precautions by not bending.   Ambulation/Gait Ambulation/Gait assistance: Supervision Ambulation Distance (Feet): 200 Feet Assistive device: Rolling walker (2 wheeled) Gait Pattern/deviations: WFL(Within Functional Limits)     General Gait Details:  decreased gait speed  Stairs Stairs: Yes Stairs assistance: Min guard Stair Management: One rail Left Number of Stairs: 4 General stair comments: pt taught how to ambulate up stairs by side-stepping using the L rail. pt required cues for appropriate hand placement. Pt was educated on how to have husband help her ascend and descend stairs safely.   Wheelchair Mobility    Modified Rankin (Stroke Patients Only)       Balance Overall balance assessment: History of Falls                                           Pertinent Vitals/Pain Pain Assessment: Faces Faces Pain Scale: Hurts a little bit Pain Location: back    Home Living Family/patient expects to be discharged to:: Private residence Living Arrangements: Spouse/significant other Available Help at Discharge: Family;Available 24 hours/day Type of Home: House Home Access: Stairs to enter Entrance Stairs-Rails: Left Entrance Stairs-Number of Steps: 4 Home Layout: One level Home Equipment: Walker - 4 wheels;Walker - 2 wheels;Cane - single point;Shower seat;Bedside commode Additional Comments: BSC remains over the toilet in the bathroom to raise the height.     Prior Function Level of Independence: Needs assistance   Gait / Transfers Assistance Needed: none  ADL's / Homemaking Assistance Needed: min A LB dressing. "my husband helps get (clothing) on my feet."        Hand Dominance   Dominant Hand: Right    Extremity/Trunk Assessment  Upper Extremity Assessment Upper Extremity Assessment: Overall WFL for tasks assessed    Lower Extremity Assessment Lower Extremity Assessment: Defer to PT evaluation    Cervical / Trunk Assessment Cervical / Trunk Assessment: Normal  Communication   Communication: No difficulties  Cognition Arousal/Alertness: Awake/alert Behavior During Therapy: WFL for tasks assessed/performed Overall Cognitive Status: Within Functional Limits for tasks assessed                       General Comments General comments (skin integrity, edema, etc.): Pt stated that she hasnt had a fall in a few months but sometimes she feels like her legs just give out and she falls. She states she is able to get up on her own if she does fall.  Educated patient on how to get in and out of the car to maintain precautions.    Exercises     Assessment/Plan    PT Assessment Patient needs continued PT services  PT Problem List Decreased activity tolerance;Decreased mobility;Decreased knowledge of precautions;Decreased knowledge of use of DME       PT Treatment Interventions DME instruction;Stair training;Patient/family education    PT Goals (Current goals can be found in the Care Plan section)  Acute Rehab PT Goals Patient Stated Goal: To get back to independence PT Goal Formulation: With patient Time For Goal Achievement: 08/28/16 Potential to Achieve Goals: Good    Frequency Min 4X/week   Barriers to discharge        Co-evaluation               End of Session Equipment Utilized During Treatment: Gait belt Activity Tolerance: Patient tolerated treatment well Patient left: in chair;with call bell/phone within reach   PT Visit Diagnosis: History of falling (Z91.81);Difficulty in walking, not elsewhere classified (R26.2)         Time: [id number] PT Time Calculation (min) (ACUTE ONLY): 22 min   Charges:   PT Evaluation $PT Eval Moderate Complexity: 1 Procedure     PT G CodesGlee Arvin, SPT 08/21/2016

## 2016-08-21 NOTE — Progress Notes (Addendum)
Inpatient Diabetes Program Recommendations  AACE/ADA: New Consensus Statement on Inpatient Glycemic Control (2015)  Target Ranges:  Prepandial:   less than 140 mg/dL      Peak postprandial:   less than 180 mg/dL (1-2 hours)      Critically ill patients:  140 - 180 mg/dL   Lab Results  Component Value Date   GLUCAP 96 08/20/2016   HGBA1C 10.5 (H) 08/12/2016    Review of Glycemic Control  Diabetes history: dm2, Obesity  Inpatient Diabetes Program Recommendations:   Insulin - Basal: Please consider decreasing Lantus to 45 units daily (home dose is Toujeo 60 units)  Correction (SSI): Please consider sensitive scale Novolog 0-9 units tidac  Oral Agents: Please consider holding Metformin 1000 mg bid and Glipizide 10 mg bid  Diet: A1C has been over 10% consistently since 11/08/2014 (10.5% on 08/12/2016 to 13.7% on 01/30/2015 range)  Note: Spoke at bedside with patient and husband regarding self-management of diabetes.  1.  Taught patient the following (teach back and/or return demonstration):  Hypo/Hyperglycemia (and how to treat - states that she has experienced both)  Medications at D/C (what these are, why taking, when taking, how taking, common S.E.'s - patient was able to list all DM med's and why/when/how taking, patient given information on 10$ copay for Toujeo and given card))  CBG monitoring, A1C (patient given information on www.goodglucos.com to help with costs of testing supplies)  How/why to check feet every day  Why exercise is important   Carb modified diet  2.  Identified barriers and facilitators to self-management goals:  Has good knowledge of DM, stated frustration with cost of DM meds and supplies, co-morbidities make DM management complicated  3.  Identified support systems:  Husband, 2 children, and 4 grandchildren   Thank you,  Windy Carina, RN, MSN Diabetes Coordinator Inpatient Diabetes Program [phone] (Team Pager)

## 2016-08-21 NOTE — Discharge Summary (Signed)
Physician Discharge Summary  Patient ID: Patricia Austin MRN: [id number] DOB/AGE: 01-23-1946 71 y.o.  Admit date: 08/20/2016 Discharge date: 08/21/2016  Admission Diagnoses:lumbar spondylolisthesis  Discharge Diagnoses:  Active Problems:   Lumbar adjacent segment disease with spondylolisthesis   Discharged Condition: good  Hospital Course: Patricia Austin was admitted and taken to the operating room for an uncomplicated lumbar decompression at L3-L5, with posterior fixation. She is ambulating, voiding , and tolerating a regular diet. Wound is clean, dry, no signs of infection.   Treatments: surgery: as above  Discharge Exam: Blood pressure (!) 175/66, pulse (!) 103, temperature 99.8 F (37.7 C), temperature source Oral, resp. rate 18, height 5\' 3"  (1.6 m), weight 95 kg (209 lb 7 oz), SpO2 99 %. General appearance: alert, cooperative, appears stated age and no distress Neurologic: Alert and oriented X 3, normal strength and tone. Normal symmetric reflexes. Normal coordination and gait  Disposition: 06-Home-Health Care Svc SPONDYLOLISTHESIS, LUMBAR REGION  Allergies as of 08/21/2016      Reactions   No Known Allergies       Medication List    TAKE these medications   amLODipine 5 MG tablet Commonly known as:  NORVASC Take 1 tablet (5 mg total) by mouth daily. What changed:  when to take this   aspirin 81 MG tablet Take 81 mg by mouth daily. Reported on 10/23/2015   benazepril 40 MG tablet Commonly known as:  LOTENSIN Take 1 tablet (40 mg total) by mouth daily before breakfast. What changed:  when to take this   carvedilol 25 MG tablet Commonly known as:  COREG TAKE  (1)  TABLET TWICE A DAY WITH MEALS (BREAKFAST AND SUPPER) What changed:  how much to take  how to take this  when to take this  additional instructions   cyclobenzaprine 10 MG tablet Commonly known as:  FLEXERIL Take 1 tablet (10 mg total) by mouth 3 (three) times daily as needed for muscle spasms.    diclofenac 75 MG EC tablet Commonly known as:  VOLTAREN TAKE (1) TABLET TWICE A DAY. What changed:  how much to take  how to take this  when to take this  additional instructions   DULoxetine 60 MG capsule Commonly known as:  CYMBALTA Take 1 capsule (60 mg total) by mouth 2 (two) times daily. What changed:  when to take this   furosemide 20 MG tablet Commonly known as:  LASIX Take 1 tablet (20 mg total) by mouth daily. What changed:  when to take this   gabapentin 300 MG capsule Commonly known as:  NEURONTIN TAKE (1) CAPSULE TWICE DAILY. What changed:  how much to take  how to take this  when to take this  additional instructions   glipiZIDE-metformin 5-500 MG tablet Commonly known as:  METAGLIP Take 2 tablets by mouth 2 (two) times daily before a meal.   glucose blood test strip Commonly known as:  ONE TOUCH ULTRA TEST Test BID and prn  E11.42   HYDROcodone-acetaminophen 7.5-325 MG tablet Commonly known as:  NORCO Take 1 tablet by mouth every 6 (six) hours as needed for moderate pain.   hydrOXYzine 10 MG tablet Commonly known as:  ATARAX/VISTARIL Take 1 tablet (10 mg total) by mouth 3 (three) times daily as needed.   Insulin Glargine 300 UNIT/ML Sopn Commonly known as:  TOUJEO SOLOSTAR Inject 60 Units into the skin daily.   Insulin Pen Needle 32G X 4 MM Misc Commonly known as:  INSUPEN PEN NEEDLES 1 each  by Does not apply route daily.   ONE TOUCH DELICA LANCING DEV Misc 1 each by Does not apply route 2 (two) times daily. Dx   E11.42   oxyCODONE-acetaminophen 5-325 MG tablet Commonly known as:  ROXICET Take 1-2 tablets by mouth every 4 (four) hours as needed. What changed:  how much to take  reasons to take this   Vitamin B-12 5000 MCG Subl Place 5,000 mcg under the tongue 3 (three) times a week.   Vitamin D3 5000 units Caps Take 5,000 Units by mouth daily.        Signed: Newt Levingston L 08/21/2016, 7:02 PM

## 2016-08-22 ENCOUNTER — Telehealth: Payer: Self-pay | Admitting: *Deleted

## 2016-08-22 MED FILL — Sodium Chloride IV Soln 0.9%: INTRAVENOUS | Qty: 1000 | Status: AC

## 2016-08-22 MED FILL — Heparin Sodium (Porcine) Inj 1000 Unit/ML: INTRAMUSCULAR | Qty: 30 | Status: AC

## 2016-08-22 NOTE — Telephone Encounter (Signed)
Call Completed and Appointment Scheduled: Yes, Date: 08/25/2016 with [place] INFORMATION Date of Discharge:08/21/2016  Discharge Facility: [facility]  Principal Discharge Diagnosis: Lumbar spondylolisthesis  Patient and/or caregiver is knowledgeable of his/her condition(s) and treatment: Yes  MEDICATION RECONCILIATION Current medication list reviewed with patient:Yes  Outpatient Encounter Prescriptions as of 08/22/2016  Medication Sig  . amLODipine (NORVASC) 5 MG tablet Take 1 tablet (5 mg total) by mouth daily. (Patient taking differently: Take 5 mg by mouth at bedtime. )  . aspirin 81 MG tablet Take 81 mg by mouth daily. Reported on 10/23/2015  . benazepril (LOTENSIN) 40 MG tablet Take 1 tablet (40 mg total) by mouth daily before breakfast. (Patient taking differently: Take 40 mg by mouth at bedtime. )  . carvedilol (COREG) 25 MG tablet TAKE  (1)  TABLET TWICE A DAY WITH MEALS (BREAKFAST AND SUPPER) (Patient taking differently: Take 25 mg by mouth 2 (two) times daily. )  . Cholecalciferol (VITAMIN D3) 5000 UNITS CAPS Take 5,000 Units by mouth daily.  . Cyanocobalamin (VITAMIN B-12) 5000 MCG SUBL Place 5,000 mcg under the tongue 3 (three) times a week.  . cyclobenzaprine (FLEXERIL) 10 MG tablet Take 1 tablet (10 mg total) by mouth 3 (three) times daily as needed for muscle spasms.  . diclofenac (VOLTAREN) 75 MG EC tablet TAKE (1) TABLET TWICE A DAY. (Patient taking differently: Take 75 mg by mouth at bedtime. )  . DULoxetine (CYMBALTA) 60 MG capsule Take 1 capsule (60 mg total) by mouth 2 (two) times daily. (Patient taking differently: Take 60 mg by mouth at bedtime. )  . furosemide (LASIX) 20 MG tablet Take 1 tablet (20 mg total) by mouth daily. (Patient taking differently: Take 20 mg by mouth 4 (four) times a week. )  . gabapentin (NEURONTIN) 300 MG capsule TAKE (1) CAPSULE TWICE DAILY. (Patient taking differently: Take 300 mg by mouth at bedtime. )  .  glipiZIDE-metformin (METAGLIP) 5-500 MG tablet Take 2 tablets by mouth 2 (two) times daily before a meal.  . glucose blood (ONE TOUCH ULTRA TEST) test strip Test BID and prn  E11.42  . HYDROcodone-acetaminophen (NORCO) 7.5-325 MG tablet Take 1 tablet by mouth every 6 (six) hours as needed for moderate pain.  . hydrOXYzine (ATARAX/VISTARIL) 10 MG tablet Take 1 tablet (10 mg total) by mouth 3 (three) times daily as needed.  . Insulin Glargine (TOUJEO SOLOSTAR) 300 UNIT/ML SOPN Inject 60 Units into the skin daily.  . Insulin Pen Needle (INSUPEN PEN NEEDLES) 32G X 4 MM MISC 1 each by Does not apply route daily.  Elmore Guise Devices (ONE TOUCH DELICA LANCING DEV) MISC 1 each by Does not apply route 2 (two) times daily. Dx   [id number]  . oxyCODONE-acetaminophen (ROXICET) 5-325 MG tablet Take 1-2 tablets by mouth every 4 (four) hours as needed. (Patient taking differently: Take 0.5-1 tablets by mouth every 4 (four) hours as needed (for pain/severe pain.). )   No facility-administered encounter medications on file as of 08/22/2016.     Discharge Medications reviewed and reconciled with current medications.Yes  Patient is able to obtain needed medications:Yes  ACTIVITIES OF DAILY LIVING  Is the patient able to perform his/her own ADLs: Yes.    Patient is receiving home health services: No.  PATIENT EDUCATION Questions/Concerns Discussed: none

## 2016-08-22 NOTE — Consult Note (Signed)
           [facility] CM Primary Care Navigator  08/22/2016  Patricia Austin 1945-11-16 [id number]   Went to see patient at the bedside earlier today to identify possible discharge needs but she was already discharged per staff.  Patient was discharged home with home health services. Primary care provider's office called [facility] Daniel) to notify of patient's discharge and need for post hospital follow-up and transition of care.  Made aware to refer patient to [facility] care management if deemed appropriate for services.  For questions, please contact:  Dannielle Huh, BSN, RN- [facility] Primary Care Navigator  Telephone: [phone] [place]

## 2016-08-22 NOTE — Care Management Note (Signed)
Case Management Note  Patient Details  Name: Patricia Austin MRN: [id number] Date of Birth: 1945/11/11  Subjective/Objective:                    Action/Plan: 08/22/2016 at 10:25 am: Patient discharged late yesterday with orders for [facility] services. CM called Mr Volk this am and went over the list of [facility] agencies. He chose [facility]. Santiago Glad with [facility] notified and accepted the referral.    Expected Discharge Date:  08/21/16               Expected Discharge Plan:  Advance  In-House Referral:     Discharge planning Services  CM Consult  Post Acute Care Choice:  Home Health Choice offered to:  Spouse  DME Arranged:    DME Agency:     HH Arranged:  PT [place]:  [facility]  Status of Service:  Completed, signed off  If discussed at [place] of Stay Meetings, dates discussed:    Additional Comments:  Pollie Friar, RN 08/22/2016, 10:28 AM

## 2016-08-23 DIAGNOSIS — Z79891 Long term (current) use of opiate analgesic: Secondary | ICD-10-CM | POA: Diagnosis not present

## 2016-08-23 DIAGNOSIS — Z794 Long term (current) use of insulin: Secondary | ICD-10-CM | POA: Diagnosis not present

## 2016-08-23 DIAGNOSIS — Z4789 Encounter for other orthopedic aftercare: Secondary | ICD-10-CM | POA: Diagnosis not present

## 2016-08-23 DIAGNOSIS — Z7982 Long term (current) use of aspirin: Secondary | ICD-10-CM | POA: Diagnosis not present

## 2016-08-23 DIAGNOSIS — E119 Type 2 diabetes mellitus without complications: Secondary | ICD-10-CM | POA: Diagnosis not present

## 2016-08-23 DIAGNOSIS — N319 Neuromuscular dysfunction of bladder, unspecified: Secondary | ICD-10-CM | POA: Diagnosis not present

## 2016-08-25 ENCOUNTER — Ambulatory Visit: Payer: Medicare Other | Admitting: Nurse Practitioner

## 2016-08-25 DIAGNOSIS — E119 Type 2 diabetes mellitus without complications: Secondary | ICD-10-CM | POA: Diagnosis not present

## 2016-08-25 DIAGNOSIS — N319 Neuromuscular dysfunction of bladder, unspecified: Secondary | ICD-10-CM | POA: Diagnosis not present

## 2016-08-25 DIAGNOSIS — Z79891 Long term (current) use of opiate analgesic: Secondary | ICD-10-CM | POA: Diagnosis not present

## 2016-08-25 DIAGNOSIS — Z7982 Long term (current) use of aspirin: Secondary | ICD-10-CM | POA: Diagnosis not present

## 2016-08-25 DIAGNOSIS — Z794 Long term (current) use of insulin: Secondary | ICD-10-CM | POA: Diagnosis not present

## 2016-08-25 DIAGNOSIS — Z4789 Encounter for other orthopedic aftercare: Secondary | ICD-10-CM | POA: Diagnosis not present

## 2016-08-28 DIAGNOSIS — Z7982 Long term (current) use of aspirin: Secondary | ICD-10-CM | POA: Diagnosis not present

## 2016-08-28 DIAGNOSIS — Z79891 Long term (current) use of opiate analgesic: Secondary | ICD-10-CM | POA: Diagnosis not present

## 2016-08-28 DIAGNOSIS — N319 Neuromuscular dysfunction of bladder, unspecified: Secondary | ICD-10-CM | POA: Diagnosis not present

## 2016-08-28 DIAGNOSIS — Z4789 Encounter for other orthopedic aftercare: Secondary | ICD-10-CM | POA: Diagnosis not present

## 2016-08-28 DIAGNOSIS — Z794 Long term (current) use of insulin: Secondary | ICD-10-CM | POA: Diagnosis not present

## 2016-08-28 DIAGNOSIS — E119 Type 2 diabetes mellitus without complications: Secondary | ICD-10-CM | POA: Diagnosis not present

## 2016-08-29 ENCOUNTER — Ambulatory Visit (INDEPENDENT_AMBULATORY_CARE_PROVIDER_SITE_OTHER): Payer: Medicare Other | Admitting: Nurse Practitioner

## 2016-08-29 ENCOUNTER — Encounter: Payer: Medicare Other | Admitting: Nurse Practitioner

## 2016-08-29 ENCOUNTER — Encounter: Payer: Self-pay | Admitting: Nurse Practitioner

## 2016-08-29 VITALS — BP 143/62 | HR 59 | Temp 97.5°F | Ht 63.0 in | Wt 198.0 lb

## 2016-08-29 DIAGNOSIS — L299 Pruritus, unspecified: Secondary | ICD-10-CM | POA: Diagnosis not present

## 2016-08-29 NOTE — Progress Notes (Signed)
   Subjective:    Patient ID: Patricia Austin, female    DOB: 12/03/1945, 71 y.o.   MRN: [id number]  HPI Patient had back surgery on 08/20/16 and says sheis doing well. SHe says a week prior to her surgery she started itching- Has gotten worse since she has been home. Denies any new meds, soaps or detergents. She has beenusing benadryl which has helped some- has rx fro atarax at home but has not used it.    Review of Systems  Constitutional: Negative.   HENT: Negative.   Respiratory: Negative.   Cardiovascular: Negative.   Genitourinary: Negative.   Skin: Negative.   Neurological: Negative.   Psychiatric/Behavioral: Negative.   All other systems reviewed and are negative.      Objective:   Physical Exam  Constitutional: She appears well-developed.  Cardiovascular: Normal rate and regular rhythm.   Pulmonary/Chest: Effort normal.  Neurological: She is alert.  Skin: Skin is warm. There is pallor.  Psychiatric: She has a normal mood and affect. Her behavior is normal. Judgment and thought content normal.   BP (!) 143/62   Pulse (!) 59   Temp 97.5 F (36.4 C) (Oral)   Ht 5\' 3"  (1.6 m)   Wt 198 lb (89.8 kg)   BMI 35.07 kg/m         Assessment & Plan:   1. Pruritus    Atarax as rx Try to figure out if drinking anything different or eating anything different that could be allergic to. RTO prn  Patricia Hassell Done, FNP

## 2016-08-29 NOTE — Patient Instructions (Signed)
Pruritus Pruritus is an itching feeling. There are many different conditions and factors that can make your skin itchy. Dry skin is one of the most common causes of itching. Most cases of itching do not require medical attention. Itchy skin can turn into a rash. Follow these instructions at home: Watch your pruritus for any changes. Take these steps to help with your condition: Skin Care   Moisturize your skin as needed. A moisturizer that contains petroleum jelly is best for keeping moisture in your skin.  Take or apply medicines only as directed by your health care provider. This may include:  Corticosteroid cream.  Anti-itch lotions.  Oral anti-histamines.  Apply cool compresses to the affected areas.  Try taking a bath with:  Epsom salts. Follow the instructions on the packaging. You can get these at your local pharmacy or grocery store.  Baking soda. Pour a small amount into the bath as directed by your health care provider.  Colloidal oatmeal. Follow the instructions on the packaging. You can get this at your local pharmacy or grocery store.  Try applying baking soda paste to your skin. Stir water into baking soda until it reaches a paste-like consistency.  Do not scratch your skin.  Avoid hot showers or baths, which can make itching worse. A cold shower may help with itching as long as you use a moisturizer after.  Avoid scented soaps, detergents, and perfumes. Use gentle soaps, detergents, perfumes, and other cosmetic products. General instructions   Avoid wearing tight clothes.  Keep a journal to help track what causes your itch. Write down:  What you eat.  What cosmetic products you use.  What you drink.  What you wear. This includes jewelry.  Use a humidifier. This keeps the air moist, which helps to prevent dry skin. Contact a health care provider if:  The itching does not go away after several days.  You sweat at night.  You have weight loss.  You  are unusually thirsty.  You urinate more than normal.  You are more tired than normal.  You have abdominal pain.  Your skin tingles.  You feel weak.  Your skin or the whites of your eyes look yellow (jaundice).  Your skin feels numb. This information is not intended to replace advice given to you by your health care provider. Make sure you discuss any questions you have with your health care provider. Document Released: 02/19/2011 Document Revised: 11/15/2015 Document Reviewed: 06/05/2014 Elsevier Interactive Patient Education  2017 [employer]  

## 2016-09-02 DIAGNOSIS — N319 Neuromuscular dysfunction of bladder, unspecified: Secondary | ICD-10-CM | POA: Diagnosis not present

## 2016-09-02 DIAGNOSIS — Z79891 Long term (current) use of opiate analgesic: Secondary | ICD-10-CM | POA: Diagnosis not present

## 2016-09-02 DIAGNOSIS — Z794 Long term (current) use of insulin: Secondary | ICD-10-CM | POA: Diagnosis not present

## 2016-09-02 DIAGNOSIS — E119 Type 2 diabetes mellitus without complications: Secondary | ICD-10-CM | POA: Diagnosis not present

## 2016-09-02 DIAGNOSIS — Z4789 Encounter for other orthopedic aftercare: Secondary | ICD-10-CM | POA: Diagnosis not present

## 2016-09-02 DIAGNOSIS — Z7982 Long term (current) use of aspirin: Secondary | ICD-10-CM | POA: Diagnosis not present

## 2016-09-04 DIAGNOSIS — N319 Neuromuscular dysfunction of bladder, unspecified: Secondary | ICD-10-CM | POA: Diagnosis not present

## 2016-09-04 DIAGNOSIS — E119 Type 2 diabetes mellitus without complications: Secondary | ICD-10-CM | POA: Diagnosis not present

## 2016-09-04 DIAGNOSIS — Z4789 Encounter for other orthopedic aftercare: Secondary | ICD-10-CM | POA: Diagnosis not present

## 2016-09-04 DIAGNOSIS — Z794 Long term (current) use of insulin: Secondary | ICD-10-CM | POA: Diagnosis not present

## 2016-09-04 DIAGNOSIS — Z79891 Long term (current) use of opiate analgesic: Secondary | ICD-10-CM | POA: Diagnosis not present

## 2016-09-04 DIAGNOSIS — Z7982 Long term (current) use of aspirin: Secondary | ICD-10-CM | POA: Diagnosis not present

## 2016-09-08 NOTE — Progress Notes (Signed)
Patient will bring in stool card

## 2016-09-09 DIAGNOSIS — M4316 Spondylolisthesis, lumbar region: Secondary | ICD-10-CM | POA: Diagnosis not present

## 2016-09-17 ENCOUNTER — Telehealth: Payer: Self-pay

## 2016-09-19 NOTE — Telephone Encounter (Signed)
Need to know for what? 

## 2016-09-20 ENCOUNTER — Encounter (HOSPITAL_COMMUNITY): Payer: Self-pay

## 2016-09-20 ENCOUNTER — Emergency Department (HOSPITAL_COMMUNITY): Payer: Medicare Other

## 2016-09-20 ENCOUNTER — Emergency Department (HOSPITAL_COMMUNITY)
Admission: EM | Admit: 2016-09-20 | Discharge: 2016-09-20 | Disposition: A | Discharge status: Home / Self Care | Payer: Medicare Other | Attending: Emergency Medicine | Admitting: Emergency Medicine

## 2016-09-20 DIAGNOSIS — R4781 Slurred speech: Secondary | ICD-10-CM | POA: Diagnosis not present

## 2016-09-20 DIAGNOSIS — K8020 Calculus of gallbladder without cholecystitis without obstruction: Secondary | ICD-10-CM | POA: Insufficient documentation

## 2016-09-20 DIAGNOSIS — N183 Chronic kidney disease, stage 3 (moderate): Secondary | ICD-10-CM | POA: Insufficient documentation

## 2016-09-20 DIAGNOSIS — I129 Hypertensive chronic kidney disease with stage 1 through stage 4 chronic kidney disease, or unspecified chronic kidney disease: Secondary | ICD-10-CM | POA: Diagnosis not present

## 2016-09-20 DIAGNOSIS — R200 Anesthesia of skin: Secondary | ICD-10-CM | POA: Diagnosis present

## 2016-09-20 DIAGNOSIS — K869 Disease of pancreas, unspecified: Secondary | ICD-10-CM | POA: Insufficient documentation

## 2016-09-20 DIAGNOSIS — E1122 Type 2 diabetes mellitus with diabetic chronic kidney disease: Secondary | ICD-10-CM | POA: Diagnosis not present

## 2016-09-20 DIAGNOSIS — Z85828 Personal history of other malignant neoplasm of skin: Secondary | ICD-10-CM | POA: Diagnosis not present

## 2016-09-20 DIAGNOSIS — R531 Weakness: Secondary | ICD-10-CM | POA: Diagnosis not present

## 2016-09-20 DIAGNOSIS — R269 Unspecified abnormalities of gait and mobility: Secondary | ICD-10-CM | POA: Diagnosis not present

## 2016-09-20 DIAGNOSIS — Z79899 Other long term (current) drug therapy: Secondary | ICD-10-CM | POA: Insufficient documentation

## 2016-09-20 DIAGNOSIS — K8689 Other specified diseases of pancreas: Secondary | ICD-10-CM

## 2016-09-20 DIAGNOSIS — Z794 Long term (current) use of insulin: Secondary | ICD-10-CM | POA: Insufficient documentation

## 2016-09-20 DIAGNOSIS — R5381 Other malaise: Secondary | ICD-10-CM | POA: Diagnosis not present

## 2016-09-20 DIAGNOSIS — R109 Unspecified abdominal pain: Secondary | ICD-10-CM | POA: Diagnosis not present

## 2016-09-20 DIAGNOSIS — R29810 Facial weakness: Secondary | ICD-10-CM | POA: Diagnosis not present

## 2016-09-20 DIAGNOSIS — I6789 Other cerebrovascular disease: Secondary | ICD-10-CM | POA: Diagnosis not present

## 2016-09-20 LAB — DIFFERENTIAL
Basophils Absolute: 0 10*3/uL (ref 0.0–0.1)
Basophils Relative: 0 %
Eosinophils Absolute: 0.1 10*3/uL (ref 0.0–0.7)
Eosinophils Relative: 1 %
LYMPHS ABS: 1.1 10*3/uL (ref 0.7–4.0)
Lymphocytes Relative: 17 %
Monocytes Absolute: 0.5 10*3/uL (ref 0.1–1.0)
Monocytes Relative: 8 %
NEUTROS ABS: 4.8 10*3/uL (ref 1.7–7.7)
NEUTROS PCT: 74 %

## 2016-09-20 LAB — URINALYSIS, ROUTINE W REFLEX MICROSCOPIC
Bilirubin Urine: NEGATIVE
GLUCOSE, UA: 50 mg/dL — AB
Hgb urine dipstick: NEGATIVE
KETONES UR: NEGATIVE mg/dL
Leukocytes, UA: NEGATIVE
Nitrite: NEGATIVE
PH: 5 (ref 5.0–8.0)
Protein, ur: 100 mg/dL — AB
SPECIFIC GRAVITY, URINE: 1.012 (ref 1.005–1.030)

## 2016-09-20 LAB — COMPREHENSIVE METABOLIC PANEL
ALBUMIN: 2.7 g/dL — AB (ref 3.5–5.0)
ALK PHOS: 609 U/L — AB (ref 38–126)
ALT: 42 U/L (ref 14–54)
AST: 53 U/L — AB (ref 15–41)
Anion gap: 9 (ref 5–15)
BILIRUBIN TOTAL: 4.2 mg/dL — AB (ref 0.3–1.2)
BUN: 22 mg/dL — AB (ref 6–20)
CO2: 25 mmol/L (ref 22–32)
CREATININE: 1.27 mg/dL — AB (ref 0.44–1.00)
Calcium: 9.8 mg/dL (ref 8.9–10.3)
Chloride: 101 mmol/L (ref 101–111)
GFR calc Af Amer: 48 mL/min — ABNORMAL LOW (ref >60)
GFR calc non Af Amer: 41 mL/min — ABNORMAL LOW (ref >60)
GLUCOSE: 168 mg/dL — AB (ref 65–99)
Potassium: 4.2 mmol/L (ref 3.5–5.1)
SODIUM: 135 mmol/L (ref 135–145)
Total Protein: 6.6 g/dL (ref 6.5–8.1)

## 2016-09-20 LAB — CBC
HCT: 34.8 % — ABNORMAL LOW (ref 36.0–46.0)
HEMOGLOBIN: 11.4 g/dL — AB (ref 12.0–15.0)
MCH: 31.9 pg (ref 26.0–34.0)
MCHC: 32.8 g/dL (ref 30.0–36.0)
MCV: 97.5 fL (ref 78.0–100.0)
PLATELETS: 295 10*3/uL (ref 150–400)
RBC: 3.57 MIL/uL — ABNORMAL LOW (ref 3.87–5.11)
RDW: 15.3 % (ref 11.5–15.5)
WBC: 6.6 10*3/uL (ref 4.0–10.5)

## 2016-09-20 LAB — RAPID URINE DRUG SCREEN, HOSP PERFORMED
AMPHETAMINES: NOT DETECTED
BARBITURATES: NOT DETECTED
Benzodiazepines: NOT DETECTED
Cocaine: NOT DETECTED
Opiates: NOT DETECTED
TETRAHYDROCANNABINOL: NOT DETECTED

## 2016-09-20 LAB — TROPONIN I

## 2016-09-20 LAB — PROTIME-INR
INR: 0.86
PROTHROMBIN TIME: 11.7 s (ref 11.4–15.2)

## 2016-09-20 LAB — AMMONIA: AMMONIA: 41 umol/L — AB (ref 9–35)

## 2016-09-20 LAB — CBG MONITORING, ED: Glucose-Capillary: 149 mg/dL — ABNORMAL HIGH (ref 65–99)

## 2016-09-20 LAB — APTT: aPTT: 33 seconds (ref 24–36)

## 2016-09-20 MED ORDER — IOPAMIDOL (ISOVUE-300) INJECTION 61%
80.0000 mL | Freq: Once | INTRAVENOUS | Status: AC | PRN
  Administered 2016-09-20: 80 mL via INTRAVENOUS

## 2016-09-20 MED ORDER — IOPAMIDOL (ISOVUE-300) INJECTION 61%
INTRAVENOUS | Status: AC
  Administered 2016-09-20: 30 mL via ORAL
  Filled 2016-09-20: qty 30

## 2016-09-20 NOTE — ED Notes (Signed)
In CT

## 2016-09-20 NOTE — ED Notes (Signed)
To Ct 

## 2016-09-20 NOTE — ED Notes (Signed)
Pt ambulated to bathroom. Pt ambulated with a steady gait and no difficulties were noted.

## 2016-09-20 NOTE — ED Provider Notes (Signed)
[facility] DEPT Provider Note   CSN: [id number] Arrival date & time: 09/20/16  1258   By signing my name below, I, Patricia Austin, attest that this documentation has been prepared under the direction and in the presence of Daleen Bo, MD. Electronically signed, Patricia Austin, ED Scribe. 09/20/16. 1:48 PM.  History   Chief Complaint Chief Complaint  Patient presents with  . Weakness   The history is provided by the spouse, medical records and the EMS personnel. No language interpreter was used.    HPI Comments: Patricia Austin is a 71 y.o. female with Hx of DM BIB EMS who presents to the Emergency Department complaining of confusion x 1 month. History given by husband as pt was unavailable at the time of first contact. Husband notes associated dizziness, decreased appetite, "staggering" last ocurring "a couple weeks" ago before it occurred today, increased sleep (16 hours daily), slurred speech today that also ocurred the two days leading up to today's event, "itching all over" and unsteady coordination.  The patient stated that she had left sided numbness, and her hand and face which lasted for 10 minutes earlier today.  Pt handles her own medicines and she allegedly states she is compliant with her medications. Pt has reportedly taken allergy pills without relief to itching. Husband also notes a back surgery fusion on 08/20/2016 marked the onset of her current symptoms though it helped her prior pain. Recent concern for "something in her urine" reported via husband. Husband denies vomiting.  Patient denies dysuria or urinary frequency or gross hematuria.  She states that sometimes her urine is dark in color.  She occasionally is sad, and worries about family issues.  She denies crying, or severe vegetative symptoms.  There are no other known modifying factors.  Past Medical History:  Diagnosis Date  . Anxiety    "not all the time"   . Chronic lower back pain   . Complication of  anesthesia 2002   difficulty /w intubation, with plastic surgery , because she was feeling so anxious. 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 .   Pt. reports that she  is very sensitive to med. & is hard  to wake up.  . Depression    "not all the time"   . Difficult intubation   . Dysrhythmia    told that she sometimes has extra beats. Does not see cardiologist, had a stress test many yrs. ago>10 yrs.   . Esophageal dysmotility    "getting worse recenly" (12/15/2013)  . Fibromyalgia   . GERD (gastroesophageal reflux disease)   . Hypertension   . Nephritis ~ 1955  . Osteoarthritis    knees   . Seasonal allergies    "in the spring"  . Shortness of breath    walking long distances   . squamous cell carcinoma scalp    squamous, on scalp, & has had several surgeries on scalp, for plastics repair of scalp   . Type II diabetes mellitus [facility])     Patient Active Problem List   Diagnosis Date Noted  . Lumbar adjacent segment disease with spondylolisthesis 08/20/2016  . Peripheral edema 07/18/2016  . CKD (chronic kidney disease) stage 3, GFR 30-59 ml/min 09/21/2015  . Fibromyalgia 09/20/2015  . DJD (degenerative joint disease) of knee 12/14/2013  . Diabetes ([place]) 02/09/2013  . Morbid obesity ([place]) 02/09/2013  . Hypertension 02/09/2013  . Hyperlipidemia with target LDL less than 100 02/09/2013  . Osteoarthritis of both feet 02/09/2013  . Osteoarthritis  of both knees 02/09/2013  . GERD (gastroesophageal reflux disease) 02/09/2013  . Allergic rhinitis 02/09/2013  . 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 ABDOMINAL HYSTERECTOMY    . APPENDECTOMY    . BACK SURGERY    . CARPAL TUNNEL RELEASE Right   . CARPAL TUNNEL RELEASE Left   . COLONOSCOPY    . CYST EXCISION  1990's?   scalp  . LUMBAR DISC SURGERY  ~ 2012 X 2  . SKIN GRAFT  2002   scalp; "went to [facility] to repair OR earlier in the yearr"  . SQUAMOUS CELL  CARCINOMA EXCISION  2002   "off scalp; cancerous area removed and stapled; Dr. Georgia Lopes"  . TONSILLECTOMY    . TOTAL KNEE ARTHROPLASTY Left 12/14/2013   Procedure: LEFT TOTAL KNEE ARTHROPLASTY;  Surgeon: Ninetta Lights, MD;  Location: [facility];  Service: Orthopedics;  Laterality: Left;  . TOTAL KNEE ARTHROPLASTY Left 12/15/2013    OB History    No data available       Home Medications    Prior to Admission medications   Medication Sig Start Date End Date Taking? Authorizing Provider  amLODipine (NORVASC) 5 MG tablet Take 1 tablet (5 mg total) by mouth daily. Patient taking differently: Take 5 mg by mouth at bedtime.  07/18/16  Yes Mary-Margaret Hassell Done, FNP  benazepril (LOTENSIN) 40 MG tablet Take 1 tablet (40 mg total) by mouth daily before breakfast. Patient taking differently: Take 40 mg by mouth at bedtime.  07/18/16  Yes Mary-Margaret Hassell Done, FNP  carvedilol (COREG) 25 MG tablet TAKE  (1)  TABLET TWICE A DAY WITH MEALS (BREAKFAST AND SUPPER) Patient taking differently: Take 25 mg by mouth 2 (two) times daily.  07/18/16  Yes Mary-Margaret Hassell Done, FNP  Cyanocobalamin (VITAMIN B-12) 5000 MCG SUBL Place 5,000 mcg under the tongue 3 (three) times a week.   Yes Historical Provider, MD  diclofenac (VOLTAREN) 75 MG EC tablet TAKE (1) TABLET TWICE A DAY. Patient taking differently: Take 75 mg by mouth at bedtime.  07/18/16  Yes Mary-Margaret Hassell Done, FNP  DULoxetine (CYMBALTA) 60 MG capsule Take 1 capsule (60 mg total) by mouth 2 (two) times daily. Patient taking differently: Take 60 mg by mouth at bedtime.  07/18/16  Yes Mary-Margaret Hassell Done, FNP  furosemide (LASIX) 20 MG tablet Take 1 tablet (20 mg total) by mouth daily. Patient taking differently: Take 20 mg by mouth 4 (four) times a week.  07/18/16  Yes Mary-Margaret Hassell Done, FNP  gabapentin (NEURONTIN) 300 MG capsule TAKE (1) CAPSULE TWICE DAILY. Patient taking differently: Take 300 mg by mouth at bedtime.  07/18/16  Yes Mary-Margaret Hassell Done, FNP    glipiZIDE-metformin (METAGLIP) 5-500 MG tablet Take 2 tablets by mouth 2 (two) times daily before a meal. Patient taking differently: Take 2 tablets by mouth at bedtime.  07/18/16  Yes Mary-Margaret Hassell Done, FNP  HYDROcodone-acetaminophen (NORCO) 7.5-325 MG tablet Take 1 tablet by mouth every 6 (six) hours as needed for moderate pain. 08/21/16  Yes Ashok Pall, MD  Insulin Glargine (TOUJEO SOLOSTAR) 300 UNIT/ML SOPN Inject 60 Units into the skin daily. Patient taking differently: Inject 40-50 Units into the skin at bedtime.  08/15/16  Yes Mary-Margaret Hassell Done, FNP  loratadine (ALLERGY) 10 MG tablet Take 10 mg by mouth daily. For itching   Yes Historical Provider, MD  oxyCODONE-acetaminophen (ROXICET) 5-325 MG tablet Take 1-2 tablets by mouth every 4 (four) hours as needed. Patient taking differently: Take  0.5-1 tablets by mouth every 4 (four) hours as needed (for pain/severe pain.).  05/20/16  Yes Mary-Margaret Hassell Done, FNP  aspirin 81 MG tablet Take 81 mg by mouth daily. Reported on 10/23/2015    Historical Provider, MD  Cholecalciferol (VITAMIN D3) 5000 UNITS CAPS Take 5,000 Units by mouth daily.    Historical Provider, MD  cyclobenzaprine (FLEXERIL) 10 MG tablet Take 1 tablet (10 mg total) by mouth 3 (three) times daily as needed for muscle spasms. Patient not taking: Reported on 09/20/2016 08/21/16   Ashok Pall, MD  glucose blood (ONE TOUCH ULTRA TEST) test strip Test BID and prn  E11.42 01/30/15   Mary-Margaret Hassell Done, FNP  hydrOXYzine (ATARAX/VISTARIL) 10 MG tablet Take 1 tablet (10 mg total) by mouth 3 (three) times daily as needed. Patient not taking: Reported on 09/20/2016 08/15/16   Mary-Margaret Hassell Done, FNP  Insulin Pen Needle (INSUPEN PEN NEEDLES) 32G X 4 MM MISC 1 each by Does not apply route daily. 01/30/15   Mary-Margaret Hassell Done, FNP  Lancet Devices (ONE TOUCH DELICA LANCING DEV) MISC 1 each by Does not apply route 2 (two) times daily. Dx   Q33.00 01/30/15   Mary-Margaret Hassell Done, FNP    Family  History Family History  Problem Relation Age of Onset  . Early death Mother   . Thrombosis Father     Social History Social History  Substance Use Topics  . Smoking status: Never Smoker  . Smokeless tobacco: Never Used  . Alcohol use Yes     Comment: 12/15/2013 "a couple times of year"     Allergies   No known allergies   Review of Systems Review of Systems  Constitutional: Positive for appetite change and fatigue.  Gastrointestinal: Negative for nausea and vomiting.  Musculoskeletal: Positive for gait problem. Negative for back pain.  Neurological: Positive for dizziness, weakness and numbness.  All other systems reviewed and are negative.    Physical Exam Updated Vital Signs BP (!) 156/70 (BP Location: Right Arm)   Pulse 74   Temp 98.2 F (36.8 C) (Oral)   Resp 17   Ht 5\' 3"  (1.6 m)   Wt 189 lb (85.7 kg)   SpO2 98%   BMI 33.48 kg/m   Physical Exam  Constitutional: She is oriented to person, place, and time. She appears well-developed and well-nourished. No distress.  HENT:  Head: Normocephalic and atraumatic.  Eyes: Conjunctivae and EOM are normal. Pupils are equal, round, and reactive to light.  Neck: Normal range of motion and phonation normal. Neck supple.  Cardiovascular: Normal rate and regular rhythm.   Pulmonary/Chest: Effort normal and breath sounds normal. She exhibits no tenderness.  Abdominal: Soft. She exhibits no distension and no mass. There is no tenderness. There is no guarding.  Musculoskeletal: Normal range of motion.  Neurological: She is alert and oriented to person, place, and time. She exhibits normal muscle tone.  No dysarthria, aphasia or nystagmus.  Skin: Skin is warm and dry.  Psychiatric: She has a normal mood and affect. Her behavior is normal. Judgment and thought content normal.  Nursing note and vitals reviewed.    ED Treatments / Results  DIAGNOSTIC STUDIES: Oxygen Saturation is 98% on RA, normal by my interpretation.      COORDINATION OF CARE: 1:45 PM Discussed treatment plan with spouse at bedside and spouse agreed to plan. Will order labs, review records and reassess when pt returns from imaging.  Labs (all labs ordered are listed, but only abnormal results are displayed) Labs  Reviewed  CBC - Abnormal; Notable for the following:       Result Value   RBC 3.57 (*)    Hemoglobin 11.4 (*)    HCT 34.8 (*)    All other components within normal limits  COMPREHENSIVE METABOLIC PANEL - Abnormal; Notable for the following:    Glucose, Bld 168 (*)    BUN 22 (*)    Creatinine, Ser 1.27 (*)    Albumin 2.7 (*)    AST 53 (*)    Alkaline Phosphatase 609 (*)    Total Bilirubin 4.2 (*)    GFR calc non Af Amer 41 (*)    GFR calc Af Amer 48 (*)    All other components within normal limits  URINALYSIS, ROUTINE W REFLEX MICROSCOPIC - Abnormal; Notable for the following:    Color, Urine AMBER (*)    Glucose, UA 50 (*)    Protein, ur 100 (*)    Bacteria, UA RARE (*)    Squamous Epithelial / LPF 0-5 (*)    All other components within normal limits  AMMONIA - Abnormal; Notable for the following:    Ammonia 41 (*)    All other components within normal limits  CBG MONITORING, ED - Abnormal; Notable for the following:    Glucose-Capillary 149 (*)    All other components within normal limits  PROTIME-INR  APTT  DIFFERENTIAL  TROPONIN I  RAPID URINE DRUG SCREEN, HOSP PERFORMED    EKG  EKG Interpretation  Date/Time:  Saturday September 20 2016 13:06:11 EDT Ventricular Rate:  77 PR Interval:    QRS Duration: 79 QT Interval:  397 QTC Calculation: 450 R Axis:   42 Text Interpretation:  Sinus rhythm since last tracing no significant change Confirmed by Eulis Foster  MD, Cambreigh Dearing [phone]) on 09/20/2016 2:04:39 PM       Radiology Ct Head Wo Contrast  Result Date: 09/20/2016 CLINICAL DATA:  Weakness. Left-sided numbness and tingling for 3 days. History of diabetes and hypertension. EXAM: CT HEAD WITHOUT CONTRAST  TECHNIQUE: Contiguous axial images were obtained from the base of the skull through the vertex without intravenous contrast. COMPARISON:  06/02/2013 FINDINGS: Brain: There is no evidence of acute cortical infarct, intracranial hemorrhage, mass, midline shift, or extra-axial fluid collection. Mild cerebral atrophy may have minimally progressed. Periventricular white matter hypodensities also appear slightly increased and are nonspecific but compatible with mild chronic small vessel ischemic disease. Vascular: Mild calcified atherosclerosis at the skullbase. No hyperdense vessel. Skull: No fracture or focal osseous lesion. Sinuses/Orbits: Visualized paranasal sinuses are clear. Minimal chronic air cell opacification at the mastoid tips. Unremarkable orbits. Other: None. IMPRESSION: 1. No evidence of acute intracranial abnormality. 2. Mild chronic small vessel ischemic disease. Electronically Signed   By: Logan Bores M.D.   On: 09/20/2016 13:48   Ct Abdomen Pelvis W Contrast  Result Date: 09/20/2016 CLINICAL DATA:  Transaminitis. Dysuria and lower abdominal pain. Lumbar spine surgery 1 month ago. EXAM: CT ABDOMEN AND PELVIS WITH CONTRAST TECHNIQUE: Multidetector CT imaging of the abdomen and pelvis was performed using the standard protocol following bolus administration of intravenous contrast. CONTRAST:  80 mL Isovue 300 COMPARISON:  None. FINDINGS: Lower chest: 5 mm right lower lobe lung nodule (series 4, image 12). 3 mm right lower lobe nodule (series 4, image 14). 5 mm calcified nodule in the left lower lobe. No lung consolidation or pleural effusion. Hepatobiliary: Diffusely decreased attenuation of the liver suggesting steatosis. Scattered calcified granulomas in the liver. Mild intrahepatic biliary  dilatation. Common bile duct dilatation measuring 12 mm. 3.4 cm stone in the gallbladder without evidence of acute cholecystitis. Pancreas: There is an approximately 4.0 x 3.1 x 3.9 cm heterogeneously  hypoenhancing mass at the level of the uncinate process of the pancreas. This may contact the posterior margin of the adjacent superior mesenteric artery and partially encases some small adjacent SMA branch vessels. There is not a substantial enough amount of surrounding stranding to suggest that this represents pancreatitis rather than neoplasm. There is pancreatic atrophy without ductal dilatation. Spleen: Calcified granulomas in the spleen. Adrenals/Urinary Tract: Unremarkable adrenal glands. No evidence of renal calculi or hydronephrosis. Subcentimeter low-density renal lesions bilaterally are too small to fully characterize but most likely represent cysts. Unremarkable bladder. Stomach/Bowel: Apparent mild diffuse wall thickening of the visualized distal esophagus. The stomach is within normal limits. Duodenal diverticular noted. No evidence of bowel obstruction. Mild colonic diverticulosis without evidence of diverticulitis. Prior appendectomy. Vascular/Lymphatic: Minimal atherosclerotic plaque in the abdominal aorta without aneurysm. Small portacaval lymph nodes measure up to 7 mm in short axis. Reproductive: Prior hysterectomy.  No adnexal mass. Other: No intraperitoneal free fluid. Musculoskeletal: Postoperative changes from recent L3-L5 PLIF. IMPRESSION: 1. 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 Abdominal MRI is recommended for further evaluation. 2. Associated mild intrahepatic and extrahepatic biliary dilatation. 3. Small right lower lobe lung nodules measuring up to 5 mm, indeterminate. Metastatic disease is not excluded. 4. Cholelithiasis. 5. Hepatic steatosis. 6. Distal esophageal wall thickening, query esophagitis. Electronically Signed   By: Logan Bores M.D.   On: 09/20/2016 21:13    Procedures Procedures (including critical care time)  Medications Ordered in ED Medications  iopamidol (ISOVUE-300) 61 % injection (30  mLs Oral Contrast Given 09/20/16 1858)  iopamidol (ISOVUE-300) 61 % injection 80 mL (80 mLs Intravenous Contrast Given 09/20/16 2006)     Initial Impression / Assessment and Plan / ED Course  I have reviewed the triage vital signs and the nursing notes.  Pertinent labs & imaging results that were available during my care of the patient were reviewed by me and considered in my medical decision making (see chart for details).  Clinical Course as of Sep 23 1043  Sat Sep 20, 2016  1404 MCHC: 32.8 [EW]    Clinical Course User Index [EW] Daleen Bo, MD    Medications  iopamidol (ISOVUE-300) 61 % injection (30 mLs Oral Contrast Given 09/20/16 1858)  iopamidol (ISOVUE-300) 61 % injection 80 mL (80 mLs Intravenous Contrast Given 09/20/16 2006)    No data found. Ct Head Wo Contrast  Result Date: 09/20/2016 CLINICAL DATA:  Weakness. Left-sided numbness and tingling for 3 days. History of diabetes and hypertension. EXAM: CT HEAD WITHOUT CONTRAST TECHNIQUE: Contiguous axial images were obtained from the base of the skull through the vertex without intravenous contrast. COMPARISON:  06/02/2013 FINDINGS: Brain: There is no evidence of acute cortical infarct, intracranial hemorrhage, mass, midline shift, or extra-axial fluid collection. Mild cerebral atrophy may have minimally progressed. Periventricular white matter hypodensities also appear slightly increased and are nonspecific but compatible with mild chronic small vessel ischemic disease. Vascular: Mild calcified atherosclerosis at the skullbase. No hyperdense vessel. Skull: No fracture or focal osseous lesion. Sinuses/Orbits: Visualized paranasal sinuses are clear. Minimal chronic air cell opacification at the mastoid tips. Unremarkable orbits. Other: None. IMPRESSION: 1. No evidence of acute intracranial abnormality. 2. Mild chronic small vessel ischemic disease. Electronically Signed   By: Logan Bores  M.D.   On: 09/20/2016 13:48   Ct Abdomen  Pelvis W Contrast  Result Date: 09/20/2016 CLINICAL DATA:  Transaminitis. Dysuria and lower abdominal pain. Lumbar spine surgery 1 month ago. EXAM: CT ABDOMEN AND PELVIS WITH CONTRAST TECHNIQUE: Multidetector CT imaging of the abdomen and pelvis was performed using the standard protocol following bolus administration of intravenous contrast. CONTRAST:  80 mL Isovue 300 COMPARISON:  None. FINDINGS: Lower chest: 5 mm right lower lobe lung nodule (series 4, image 12). 3 mm right lower lobe nodule (series 4, image 14). 5 mm calcified nodule in the left lower lobe. No lung consolidation or pleural effusion. Hepatobiliary: Diffusely decreased attenuation of the liver suggesting steatosis. Scattered calcified granulomas in the liver. Mild intrahepatic biliary dilatation. Common bile duct dilatation measuring 12 mm. 3.4 cm stone in the gallbladder without evidence of acute cholecystitis. Pancreas: There is an approximately 4.0 x 3.1 x 3.9 cm heterogeneously hypoenhancing mass at the level of the uncinate process of the pancreas. This may contact the posterior margin of the adjacent superior mesenteric artery and partially encases some small adjacent SMA branch vessels. There is not a substantial enough amount of surrounding stranding to suggest that this represents pancreatitis rather than neoplasm. There is pancreatic atrophy without ductal dilatation. Spleen: Calcified granulomas in the spleen. Adrenals/Urinary Tract: Unremarkable adrenal glands. No evidence of renal calculi or hydronephrosis. Subcentimeter low-density renal lesions bilaterally are too small to fully characterize but most likely represent cysts. Unremarkable bladder. Stomach/Bowel: Apparent mild diffuse wall thickening of the visualized distal esophagus. The stomach is within normal limits. Duodenal diverticular noted. No evidence of bowel obstruction. Mild colonic diverticulosis without evidence of diverticulitis. Prior appendectomy.  Vascular/Lymphatic: Minimal atherosclerotic plaque in the abdominal aorta without aneurysm. Small portacaval lymph nodes measure up to 7 mm in short axis. Reproductive: Prior hysterectomy.  No adnexal mass. Other: No intraperitoneal free fluid. Musculoskeletal: Postoperative changes from recent L3-L5 PLIF. IMPRESSION: 1. 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 Abdominal MRI is recommended for further evaluation. 2. Associated mild intrahepatic and extrahepatic biliary dilatation. 3. Small right lower lobe lung nodules measuring up to 5 mm, indeterminate. Metastatic disease is not excluded. 4. Cholelithiasis. 5. Hepatic steatosis. 6. Distal esophageal wall thickening, query esophagitis. Electronically Signed   By: Logan Bores M.D.   On: 09/20/2016 21:13    At D/C Reevaluation with update and discussion. After initial assessment and treatment, an updated evaluation reveals patient is comfortable. Findings and plan for further evaluation discussed. All questions answered. Nayana Lenig L    Final Clinical Impressions(s) / ED Diagnoses   Final diagnoses:  Malaise  Pancreatic mass  Hyperbilirubinemia  Gall stones    Malaise an pruritis with nonspecific sx, and painless jaundice. CT ordered to expedite care. CT with likely pancreatic cancer. Patient with 10 lb. Weight loss since 08/29/16.  Patient is nontoxic and comfortable. Pruritis likely d/t hyperbilirubinemia.  Nursing Notes Reviewed/ Care Coordinated Applicable Imaging Reviewed Interpretation of Laboratory Data incorporated into ED treatment  The patient appears reasonably screened and/or stabilized for discharge and I doubt any other medical condition or other [facility] requiring further screening, evaluation, or treatment in the ED at this time prior to discharge.  Plan: Home Medications- continue; Home Treatments- rest, fluids; return here if the recommended treatment, does  not improve the symptoms; Recommended follow up- PCP 1 week. Contact GI for diagnostic evaluation, and planning.    New Prescriptions Discharge Medication List as of 09/20/2016  9:39 PM     I personally performed the services described in this documentation, which was scribed in my presence. The recorded information has been reviewed and is accurate.       Daleen Bo, MD 09/22/16 1056

## 2016-09-20 NOTE — ED Triage Notes (Signed)
Patient here from home by [facility] for left side numbness/tingling x3 days. States she has had difficulty walking for several days as well. Symptoms will come and go per patient. Patient alert and oriented x4. Equal grips and strength noted.

## 2016-09-20 NOTE — ED Notes (Signed)
Ambulated patient to restroom to obtain ua sample, no difficulties noted.

## 2016-09-20 NOTE — ED Notes (Signed)
Patient states she is not able to give urine sample at this time but does think she can if she drinks a little. Patient drinking water at this time.

## 2016-09-20 NOTE — Discharge Instructions (Signed)
Make sure you are drinking plenty of fluids especially water 1-2 L each day.  Try to eat 3 meals each day.

## 2016-09-22 NOTE — Telephone Encounter (Signed)
Pt said for the itching that she has had for over the last month now. Spent Saturday in the ED at [facility]. Has Gallstones, 2 nodules on her lungs and that the itching maybe from her liver issues.

## 2016-09-23 ENCOUNTER — Other Ambulatory Visit: Payer: Self-pay | Admitting: Nurse Practitioner

## 2016-09-23 DIAGNOSIS — K8689 Other specified diseases of pancreas: Secondary | ICD-10-CM

## 2016-09-23 NOTE — Telephone Encounter (Signed)
lft's slightly elevtaed at last visit-that could be from gall bladder- doubt it is causing th eitching

## 2016-09-23 NOTE — Progress Notes (Signed)
Have spoke with patient about abd Ct results- did urgent GI referral

## 2016-09-23 NOTE — Telephone Encounter (Signed)
Spoke with patient and her husband.  She does not think she needs a urology referral, but a gastroenterology referral for the pancreatic mass that was found on the CT of the abdomen and pelvis.  Will send to Chevis Pretty, FNP to review.  Her Itching is still bothersome, but not as severe today.

## 2016-09-24 ENCOUNTER — Other Ambulatory Visit (INDEPENDENT_AMBULATORY_CARE_PROVIDER_SITE_OTHER): Payer: Medicare Other

## 2016-09-24 ENCOUNTER — Encounter: Payer: Self-pay | Admitting: Internal Medicine

## 2016-09-24 ENCOUNTER — Other Ambulatory Visit: Payer: Self-pay | Admitting: Internal Medicine

## 2016-09-24 ENCOUNTER — Telehealth: Payer: Self-pay | Admitting: Internal Medicine

## 2016-09-24 ENCOUNTER — Ambulatory Visit (INDEPENDENT_AMBULATORY_CARE_PROVIDER_SITE_OTHER): Payer: Medicare Other | Admitting: Internal Medicine

## 2016-09-24 VITALS — BP 110/50 | HR 78 | Resp 20 | Ht 63.0 in | Wt 188.0 lb

## 2016-09-24 DIAGNOSIS — K831 Obstruction of bile duct: Secondary | ICD-10-CM

## 2016-09-24 DIAGNOSIS — K8689 Other specified diseases of pancreas: Secondary | ICD-10-CM

## 2016-09-24 DIAGNOSIS — L298 Other pruritus: Secondary | ICD-10-CM | POA: Diagnosis not present

## 2016-09-24 DIAGNOSIS — K869 Disease of pancreas, unspecified: Secondary | ICD-10-CM | POA: Diagnosis not present

## 2016-09-24 LAB — COMPREHENSIVE METABOLIC PANEL
ALT: 40 U/L — ABNORMAL HIGH (ref 0–35)
AST: 49 U/L — ABNORMAL HIGH (ref 0–37)
Albumin: 3.4 g/dL — ABNORMAL LOW (ref 3.5–5.2)
Alkaline Phosphatase: 610 U/L — ABNORMAL HIGH (ref 39–117)
BILIRUBIN TOTAL: 3.9 mg/dL — AB (ref 0.2–1.2)
BUN: 20 mg/dL (ref 6–23)
CALCIUM: 9.7 mg/dL (ref 8.4–10.5)
CHLORIDE: 100 meq/L (ref 96–112)
CO2: 28 meq/L (ref 19–32)
CREATININE: 1.39 mg/dL — AB (ref 0.40–1.20)
GFR: 39.54 mL/min — AB (ref >60.00)
Glucose, Bld: 329 mg/dL — ABNORMAL HIGH (ref 70–99)
Potassium: 4.3 mEq/L (ref 3.5–5.1)
Sodium: 134 mEq/L — ABNORMAL LOW (ref 135–145)
Total Protein: 6.8 g/dL (ref 6.0–8.3)

## 2016-09-24 LAB — CBC WITH DIFFERENTIAL/PLATELET
BASOS PCT: 0.9 % (ref 0.0–3.0)
Basophils Absolute: 0.1 10*3/uL (ref 0.0–0.1)
EOS PCT: 1.6 % (ref 0.0–5.0)
Eosinophils Absolute: 0.1 10*3/uL (ref 0.0–0.7)
HEMATOCRIT: 33.7 % — AB (ref 36.0–46.0)
HEMOGLOBIN: 11.5 g/dL — AB (ref 12.0–15.0)
Lymphocytes Relative: 13 % (ref 12.0–46.0)
Lymphs Abs: 1 10*3/uL (ref 0.7–4.0)
MCHC: 34 g/dL (ref 30.0–36.0)
MCV: 95.2 fl (ref 78.0–100.0)
MONO ABS: 0.7 10*3/uL (ref 0.1–1.0)
MONOS PCT: 9.1 % (ref 3.0–12.0)
Neutro Abs: 5.7 10*3/uL (ref 1.4–7.7)
Neutrophils Relative %: 75.4 % (ref 43.0–77.0)
Platelets: 288 10*3/uL (ref 150.0–400.0)
RBC: 3.54 Mil/uL — AB (ref 3.87–5.11)
RDW: 14.6 % (ref 11.5–15.5)
WBC: 7.5 10*3/uL (ref 4.0–10.5)

## 2016-09-24 NOTE — Patient Instructions (Signed)
If you are age 73 or older, your body mass index should be between 23-30. Your Body mass index is 33.3 kg/m. If this is out of the aforementioned range listed, please consider follow up with your Primary Care Provider.  If you are age 69 or younger, your body mass index should be between 19-25. Your Body mass index is 33.3 kg/m. If this is out of the aformentioned range listed, please consider follow up with your Primary Care Provider.   Use CeraVE lotion (over the counter) for itching.  Benadryl 25 mg at bedtime.  We will contact you about ERCP once Dr Ardis Hughs has given [place] a time and date for your procedure.

## 2016-09-24 NOTE — Progress Notes (Signed)
HPI: Patricia Austin is a 71 year old female with a past medical history of GERD, hypertension, diabetes, depression, lower back pain who is seen in consultation at the request of Chevis Pretty, FNP to evaluate abnormal pancreatic imaging. She is here today with her husband.  She reports that over the last 2 months she has been dealing with intermittent upper abdominal pain. This would come and go and was located bilaterally in the epigastrium. She was concerned that she may develop shingles but never developed a rash. The pain portion has actually see somewhat improved. She developed intense diffuse pruritus. No clear cause initially and she was treated with Benadryl and Atarax.  This past Saturday she developed dizziness along with facial numbness and left arm numbness. She went to the ER by 911 where she was ruled out for CVA. She was noted to be jaundiced and had an abdominal CT scan showing a pancreatic mass in the uncinate process with intra and extrahepatic delivery ductal dilatation.  Today she denies abdominal pain. She reports being told by her family that her scans look yellow. Her stools of been slightly looser and lighter in color. Her urine has been dark. Itching has continued. No nausea or vomiting. No fevers or chills. She has lost about 30 pounds in the last 2-3 months. She had back surgery in February, lumbar fusions. She had a tongue injury due to intubation and has been eating soft foods since this time.  She had a colonoscopy 11 years ago which she reports was normal at [place]  Her father died from coronary artery disease/7 cardiac death at age 62. Her mother died at age 90 in a motor vehicle accident  Past Medical History:  Diagnosis Date  . Anxiety    "not all the time"   . Chronic lower back pain   . Complication of anesthesia 2002   difficulty /w intubation, with plastic surgery , because she was feeling so anxious. 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 .   Pt.  reports that she  is very sensitive to med. & is hard  to wake up.  . Depression    "not all the time"   . Difficult intubation   . Dysrhythmia    told that she sometimes has extra beats. Does not see cardiologist, had a stress test many yrs. ago>10 yrs.   . Esophageal dysmotility    "getting worse recenly" (12/15/2013)  . Fibromyalgia   . GERD (gastroesophageal reflux disease)   . Hypertension   . Nephritis ~ 1955  . Osteoarthritis    knees   . Seasonal allergies    "in the spring"  . Shortness of breath    walking long distances   . squamous cell carcinoma scalp    squamous, on scalp, & has had several surgeries on scalp, for plastics repair of scalp   . Type II diabetes mellitus ([place])     Past Surgical History:  Procedure Laterality Date  . ABDOMINAL HYSTERECTOMY    . APPENDECTOMY    . BACK SURGERY    . CARPAL TUNNEL RELEASE Right   . CARPAL TUNNEL RELEASE Left   . COLONOSCOPY    . CYST EXCISION  1990's?   scalp  . LUMBAR DISC SURGERY  ~ 2012 X 2  . SKIN GRAFT  2002   scalp; "went to [facility] to repair OR earlier in the yearr"  . SQUAMOUS CELL CARCINOMA EXCISION  2002   "off scalp; cancerous area removed and stapled;  Dr. Georgia Lopes"  . TONSILLECTOMY    . TOTAL KNEE ARTHROPLASTY Left 12/14/2013   Procedure: LEFT TOTAL KNEE ARTHROPLASTY;  Surgeon: Ninetta Lights, MD;  Location: [facility];  Service: Orthopedics;  Laterality: Left;  . TOTAL KNEE ARTHROPLASTY Left 12/15/2013    Outpatient Medications Prior to Visit  Medication Sig Dispense Refill  . amLODipine (NORVASC) 5 MG tablet Take 1 tablet (5 mg total) by mouth daily. (Patient taking differently: Take 5 mg by mouth at bedtime. ) 90 tablet 1  . benazepril (LOTENSIN) 40 MG tablet Take 1 tablet (40 mg total) by mouth daily before breakfast. (Patient taking differently: Take 40 mg by mouth at bedtime. ) 90 tablet 1  . carvedilol (COREG) 25 MG tablet TAKE  (1)  TABLET TWICE A DAY WITH MEALS (BREAKFAST AND SUPPER) (Patient taking  differently: Take 25 mg by mouth 2 (two) times daily. ) 180 tablet 1  . diclofenac (VOLTAREN) 75 MG EC tablet TAKE (1) TABLET TWICE A DAY. (Patient taking differently: Take 75 mg by mouth at bedtime. ) 180 tablet 1  . DULoxetine (CYMBALTA) 60 MG capsule Take 1 capsule (60 mg total) by mouth 2 (two) times daily. (Patient taking differently: Take 60 mg by mouth at bedtime. ) 180 capsule 1  . furosemide (LASIX) 20 MG tablet Take 1 tablet (20 mg total) by mouth daily. (Patient taking differently: Take 20 mg by mouth 4 (four) times a week. ) 90 tablet 1  . gabapentin (NEURONTIN) 300 MG capsule TAKE (1) CAPSULE TWICE DAILY. (Patient taking differently: Take 300 mg by mouth at bedtime. ) 60 capsule 4  . glipiZIDE-metformin (METAGLIP) 5-500 MG tablet Take 2 tablets by mouth 2 (two) times daily before a meal. (Patient taking differently: Take 2 tablets by mouth at bedtime. ) 360 tablet 1  . glucose blood (ONE TOUCH ULTRA TEST) test strip Test BID and prn  E11.42 100 each 12  . Insulin Glargine (TOUJEO SOLOSTAR) 300 UNIT/ML SOPN Inject 60 Units into the skin daily. (Patient taking differently: Inject 40-50 Units into the skin at bedtime. ) 6 pen 5  . Insulin Pen Needle (INSUPEN PEN NEEDLES) 32G X 4 MM MISC 1 each by Does not apply route daily. 100 each 3  . Lancet Devices (ONE TOUCH DELICA LANCING DEV) MISC 1 each by Does not apply route 2 (two) times daily. Dx   E11.42 100 each 5  . aspirin 81 MG tablet Take 81 mg by mouth daily. Reported on 10/23/2015    . HYDROcodone-acetaminophen (NORCO) 7.5-325 MG tablet Take 1 tablet by mouth every 6 (six) hours as needed for moderate pain. (Patient not taking: Reported on 09/24/2016) 30 tablet 0  . oxyCODONE-acetaminophen (ROXICET) 5-325 MG tablet Take 1-2 tablets by mouth every 4 (four) hours as needed. (Patient not taking: Reported on 09/24/2016) 40 tablet 0  . Cholecalciferol (VITAMIN D3) 5000 UNITS CAPS Take 5,000 Units by mouth daily.    . Cyanocobalamin (VITAMIN B-12)  5000 MCG SUBL Place 5,000 mcg under the tongue 3 (three) times a week.    . cyclobenzaprine (FLEXERIL) 10 MG tablet Take 1 tablet (10 mg total) by mouth 3 (three) times daily as needed for muscle spasms. (Patient not taking: Reported on 09/20/2016) 45 tablet 0  . hydrOXYzine (ATARAX/VISTARIL) 10 MG tablet Take 1 tablet (10 mg total) by mouth 3 (three) times daily as needed. (Patient not taking: Reported on 09/20/2016) 30 tablet 2  . loratadine (ALLERGY) 10 MG tablet Take 10 mg by mouth daily. For  itching     No facility-administered medications prior to visit.     Allergies  Allergen Reactions  . No Known Allergies     Family History  Problem Relation Age of Onset  . Early death Mother   . Thrombosis Father     Social History  Substance Use Topics  . Smoking status: Never Smoker  . Smokeless tobacco: Never Used  . Alcohol use Yes     Comment: 12/15/2013 "a couple times of year"    ROS: As per history of present illness, otherwise negative  BP (!) 110/50   Pulse 78   Resp 20   Ht 5\' 3"  (1.6 m)   Wt 188 lb (85.3 kg)   BMI 33.30 kg/m  Constitutional: Well-developed and well-nourished. No distress. HEENT: Normocephalic and atraumatic. Oropharynx is clear and moist. No oropharyngeal exudate. Conjunctivae are normal.  + scleral icterus Neck: Neck supple. Trachea midline. Cardiovascular: Normal rate, regular rhythm and intact distal pulses. No M/R/G Pulmonary/chest: Effort normal and breath sounds normal. No wheezing, rales or rhonchi. Abdominal: Soft, Epigastric firmness though nontender, nondistended. Bowel sounds active throughout.  Extremities: no clubbing, cyanosis, or edema Lymphadenopathy: No cervical adenopathy noted. Neurological: Alert and oriented to person place and time. Skin: Skin is warm and dry. Jaundice Psychiatric: Normal mood and affect. Behavior is normal.  RELEVANT LABS AND IMAGING: CBC    Component Value Date/Time   WBC 6.6 09/20/2016 1314   RBC 3.57  (L) 09/20/2016 1314   HGB 11.4 (L) 09/20/2016 1314   HCT 34.8 (L) 09/20/2016 1314   PLT 295 09/20/2016 1314   MCV 97.5 09/20/2016 1314   MCH 31.9 09/20/2016 1314   MCHC 32.8 09/20/2016 1314   RDW 15.3 09/20/2016 1314   LYMPHSABS 1.1 09/20/2016 1314   MONOABS 0.5 09/20/2016 1314   EOSABS 0.1 09/20/2016 1314   BASOSABS 0.0 09/20/2016 1314    CMP     Component Value Date/Time   NA 135 09/20/2016 1314   NA 141 07/18/2016 1046   K 4.2 09/20/2016 1314   CL 101 09/20/2016 1314   CO2 25 09/20/2016 1314   GLUCOSE 168 (H) 09/20/2016 1314   BUN 22 (H) 09/20/2016 1314   BUN 19 07/18/2016 1046   CREATININE 1.27 (H) 09/20/2016 1314   CALCIUM 9.8 09/20/2016 1314   PROT 6.6 09/20/2016 1314   PROT 6.7 07/18/2016 1046   ALBUMIN 2.7 (L) 09/20/2016 1314   ALBUMIN 4.1 07/18/2016 1046   AST 53 (H) 09/20/2016 1314   ALT 42 09/20/2016 1314   ALKPHOS 609 (H) 09/20/2016 1314   BILITOT 4.2 (H) 09/20/2016 1314   BILITOT 0.3 07/18/2016 1046   GFRNONAA 41 (L) 09/20/2016 1314   GFRAA 48 (L) 09/20/2016 1314    ASSESSMENT/PLAN: 72 year old female with a past medical history of GERD, hypertension, diabetes, depression, lower back pain who is seen in consultation at the request of Chevis Pretty, FNP to evaluate abnormal pancreatic imaging.   1. 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 Jaundice and itching all relate to cholestasis due to the pancreatic mass compression of CBD. I recommended ERCP for biliary brushing and biliary stenting. If nondiagnostic for malignancy she will need EUS with FNA. She is nontoxic-appearing with no evidence for cholangitis. I will discuss ERCP with colleagues to try to arrange ASAP. After this she will need oncology and surgery referrals.  For  her itching I recommended that she use a fragrance free moisturizing  lotion and Atarax or Benadryl as needed. I expect itching to improve once bilary obstruction relieved  CBC and CMP pending from today  EA:[id number] [address], [address] [place], [place] [id number]

## 2016-09-24 NOTE — Telephone Encounter (Signed)
Please see if patient can come for consult today at 3pm to see me Needs CBC and CMP before this visit

## 2016-09-24 NOTE — Telephone Encounter (Signed)
Pt scheduled to see Dr. Hilarie Fredrickson today at 3pm, lab orders entered. Pt aware of appt.

## 2016-09-24 NOTE — Telephone Encounter (Signed)
Dr. Hilarie Fredrickson this referral came in yesterday afternoon when you were DOD. Pt has pancreatic mass. Please advise regarding scheduling. First available appts with [facility] are mid-late next week. Since you were DOD when this came in please advise.

## 2016-09-25 ENCOUNTER — Telehealth: Payer: Self-pay | Admitting: *Deleted

## 2016-09-25 ENCOUNTER — Encounter (HOSPITAL_COMMUNITY): Payer: Self-pay | Admitting: *Deleted

## 2016-09-25 NOTE — Telephone Encounter (Signed)
I have spoken to patient to advise that she is scheduled for ERCP at [facility] on 09/26/16 @ 1:30 pm with 12:00 pm arrival. Patient advised clear liquids only after midnight tonight and up until 9:30 am tomorrow. NPO after 9:30 am. Diabetic medications altered as per protocol. Patient verbalizes understanding.

## 2016-09-25 NOTE — Progress Notes (Signed)
?   Pt denies SOB and chest pain. Pt stated that during several past surgical procedures " my tongue was clamped between my teeth and it took a long time to get better."  Pt made aware to stop taking  Aspirin, vitamins, fish oil and herbal medications. Do not take any NSAIDs ie: Ibuprofen, Advil, Naproxen, BC and Goody Powder or any medication containing Aspirin such as Voltaren. Pt stated that she was advised to take half dose of Toujeo insulin tonight. Pt made aware to not take Glipizide tonight. Check your blood sugar the morning of your surgery when you wake up and every 2 hours until you get to the hospital  If your blood sugar is less than 70 mg/dL, you will need to treat for low blood sugar: ? Treat a low blood sugar (less than 70 mg/dL) with  cup of clear juice (cranberry or apple), 4glucose tablets, OR glucose gel. ? Recheck blood sugar in 15 minutes after treatment (to make sure it is greater than 70 mg/dL). If your blood sugar is not greater than 70 mg/dL on recheck, call [phone] for further instructions.  Report your blood sugar to nurse upon arrival. Pt verbalized understanding of all pre-op instructions.

## 2016-09-25 NOTE — Addendum Note (Signed)
Addended by: Larina Bras on: 09/25/2016 09:04 AM   Modules accepted: Orders, SmartSet

## 2016-09-25 NOTE — Addendum Note (Signed)
Addended by: Larina Bras on: 09/25/2016 02:33 PM   Modules accepted: Orders

## 2016-09-26 ENCOUNTER — Ambulatory Visit (HOSPITAL_COMMUNITY): Payer: Medicare Other | Admitting: Certified Registered Nurse Anesthetist

## 2016-09-26 ENCOUNTER — Encounter (HOSPITAL_COMMUNITY): Payer: Self-pay | Admitting: Certified Registered Nurse Anesthetist

## 2016-09-26 ENCOUNTER — Ambulatory Visit (HOSPITAL_COMMUNITY)
Admission: RE | Admit: 2016-09-26 | Discharge: 2016-09-26 | Disposition: A | Discharge status: Home / Self Care | Payer: Medicare Other | Source: Ambulatory Visit | Attending: Internal Medicine | Admitting: Internal Medicine

## 2016-09-26 ENCOUNTER — Ambulatory Visit (HOSPITAL_BASED_OUTPATIENT_CLINIC_OR_DEPARTMENT_OTHER)
Admission: RE | Admit: 2016-09-26 | Discharge: 2016-09-26 | Disposition: A | Discharge status: Home / Self Care | Payer: Medicare Other | Source: Ambulatory Visit | Attending: Internal Medicine | Admitting: Internal Medicine

## 2016-09-26 ENCOUNTER — Telehealth: Payer: Self-pay

## 2016-09-26 ENCOUNTER — Encounter (HOSPITAL_COMMUNITY)
Admission: RE | Disposition: A | Discharge status: Home / Self Care | Payer: Self-pay | Source: Ambulatory Visit | Attending: Internal Medicine

## 2016-09-26 DIAGNOSIS — K8689 Other specified diseases of pancreas: Secondary | ICD-10-CM

## 2016-09-26 DIAGNOSIS — C258 Malignant neoplasm of overlapping sites of pancreas: Secondary | ICD-10-CM | POA: Diagnosis not present

## 2016-09-26 DIAGNOSIS — Z7982 Long term (current) use of aspirin: Secondary | ICD-10-CM

## 2016-09-26 DIAGNOSIS — N179 Acute kidney failure, unspecified: Secondary | ICD-10-CM | POA: Diagnosis not present

## 2016-09-26 DIAGNOSIS — F419 Anxiety disorder, unspecified: Secondary | ICD-10-CM | POA: Insufficient documentation

## 2016-09-26 DIAGNOSIS — K760 Fatty (change of) liver, not elsewhere classified: Secondary | ICD-10-CM | POA: Diagnosis not present

## 2016-09-26 DIAGNOSIS — E785 Hyperlipidemia, unspecified: Secondary | ICD-10-CM | POA: Diagnosis not present

## 2016-09-26 DIAGNOSIS — Z79899 Other long term (current) drug therapy: Secondary | ICD-10-CM

## 2016-09-26 DIAGNOSIS — Z96652 Presence of left artificial knee joint: Secondary | ICD-10-CM | POA: Insufficient documentation

## 2016-09-26 DIAGNOSIS — K2981 Duodenitis with bleeding: Secondary | ICD-10-CM

## 2016-09-26 DIAGNOSIS — F329 Major depressive disorder, single episode, unspecified: Secondary | ICD-10-CM | POA: Insufficient documentation

## 2016-09-26 DIAGNOSIS — N183 Chronic kidney disease, stage 3 (moderate): Secondary | ICD-10-CM | POA: Diagnosis not present

## 2016-09-26 DIAGNOSIS — E1142 Type 2 diabetes mellitus with diabetic polyneuropathy: Secondary | ICD-10-CM | POA: Diagnosis not present

## 2016-09-26 DIAGNOSIS — E1165 Type 2 diabetes mellitus with hyperglycemia: Secondary | ICD-10-CM | POA: Diagnosis not present

## 2016-09-26 DIAGNOSIS — Q399 Congenital malformation of esophagus, unspecified: Secondary | ICD-10-CM | POA: Insufficient documentation

## 2016-09-26 DIAGNOSIS — K831 Obstruction of bile duct: Secondary | ICD-10-CM

## 2016-09-26 DIAGNOSIS — Z538 Procedure and treatment not carried out for other reasons: Secondary | ICD-10-CM

## 2016-09-26 DIAGNOSIS — K869 Disease of pancreas, unspecified: Secondary | ICD-10-CM

## 2016-09-26 DIAGNOSIS — M797 Fibromyalgia: Secondary | ICD-10-CM | POA: Insufficient documentation

## 2016-09-26 DIAGNOSIS — K5710 Diverticulosis of small intestine without perforation or abscess without bleeding: Secondary | ICD-10-CM

## 2016-09-26 DIAGNOSIS — A419 Sepsis, unspecified organism: Secondary | ICD-10-CM | POA: Diagnosis not present

## 2016-09-26 DIAGNOSIS — L299 Pruritus, unspecified: Secondary | ICD-10-CM | POA: Diagnosis not present

## 2016-09-26 DIAGNOSIS — I10 Essential (primary) hypertension: Secondary | ICD-10-CM

## 2016-09-26 DIAGNOSIS — Z9049 Acquired absence of other specified parts of digestive tract: Secondary | ICD-10-CM | POA: Diagnosis not present

## 2016-09-26 DIAGNOSIS — K8021 Calculus of gallbladder without cholecystitis with obstruction: Secondary | ICD-10-CM | POA: Diagnosis not present

## 2016-09-26 DIAGNOSIS — G8929 Other chronic pain: Secondary | ICD-10-CM | POA: Diagnosis not present

## 2016-09-26 DIAGNOSIS — K224 Dyskinesia of esophagus: Secondary | ICD-10-CM | POA: Diagnosis not present

## 2016-09-26 DIAGNOSIS — R8589 Other abnormal findings in specimens from digestive organs and abdominal cavity: Secondary | ICD-10-CM | POA: Diagnosis not present

## 2016-09-26 DIAGNOSIS — K219 Gastro-esophageal reflux disease without esophagitis: Secondary | ICD-10-CM

## 2016-09-26 DIAGNOSIS — G9340 Encephalopathy, unspecified: Secondary | ICD-10-CM | POA: Diagnosis not present

## 2016-09-26 DIAGNOSIS — G9341 Metabolic encephalopathy: Secondary | ICD-10-CM | POA: Diagnosis not present

## 2016-09-26 DIAGNOSIS — E119 Type 2 diabetes mellitus without complications: Secondary | ICD-10-CM | POA: Insufficient documentation

## 2016-09-26 DIAGNOSIS — Z85828 Personal history of other malignant neoplasm of skin: Secondary | ICD-10-CM | POA: Insufficient documentation

## 2016-09-26 DIAGNOSIS — Z794 Long term (current) use of insulin: Secondary | ICD-10-CM | POA: Insufficient documentation

## 2016-09-26 DIAGNOSIS — R410 Disorientation, unspecified: Secondary | ICD-10-CM | POA: Diagnosis not present

## 2016-09-26 DIAGNOSIS — R918 Other nonspecific abnormal finding of lung field: Secondary | ICD-10-CM | POA: Diagnosis not present

## 2016-09-26 DIAGNOSIS — K269 Duodenal ulcer, unspecified as acute or chronic, without hemorrhage or perforation: Secondary | ICD-10-CM | POA: Insufficient documentation

## 2016-09-26 DIAGNOSIS — I129 Hypertensive chronic kidney disease with stage 1 through stage 4 chronic kidney disease, or unspecified chronic kidney disease: Secondary | ICD-10-CM | POA: Diagnosis not present

## 2016-09-26 DIAGNOSIS — R509 Fever, unspecified: Secondary | ICD-10-CM | POA: Diagnosis not present

## 2016-09-26 DIAGNOSIS — R17 Unspecified jaundice: Secondary | ICD-10-CM | POA: Diagnosis not present

## 2016-09-26 DIAGNOSIS — K830 Cholangitis: Secondary | ICD-10-CM | POA: Diagnosis not present

## 2016-09-26 DIAGNOSIS — M545 Low back pain: Secondary | ICD-10-CM | POA: Diagnosis not present

## 2016-09-26 DIAGNOSIS — E1122 Type 2 diabetes mellitus with diabetic chronic kidney disease: Secondary | ICD-10-CM | POA: Diagnosis not present

## 2016-09-26 DIAGNOSIS — I499 Cardiac arrhythmia, unspecified: Secondary | ICD-10-CM | POA: Diagnosis not present

## 2016-09-26 HISTORY — DX: Other specified diseases of pancreas: K86.89

## 2016-09-26 HISTORY — PX: ESOPHAGOGASTRODUODENOSCOPY: SHX5428

## 2016-09-26 LAB — GLUCOSE, CAPILLARY: Glucose-Capillary: 179 mg/dL — ABNORMAL HIGH (ref 65–99)

## 2016-09-26 SURGERY — EGD (ESOPHAGOGASTRODUODENOSCOPY)
Anesthesia: General

## 2016-09-26 MED ORDER — LACTATED RINGERS IV SOLN: INTRAVENOUS | Status: DC

## 2016-09-26 MED ORDER — FENTANYL CITRATE (PF) 100 MCG/2ML IJ SOLN
INTRAMUSCULAR | Status: DC | PRN
  Administered 2016-09-26: 50 ug via INTRAVENOUS

## 2016-09-26 MED ORDER — IOPAMIDOL (ISOVUE-300) INJECTION 61%
INTRAVENOUS | Status: AC
  Filled 2016-09-26: qty 50

## 2016-09-26 MED ORDER — PANTOPRAZOLE SODIUM 40 MG PO TBEC: 40.0000 mg | DELAYED_RELEASE_TABLET | Freq: Every day | ORAL | 5 refills | Status: DC

## 2016-09-26 MED ORDER — SODIUM CHLORIDE 0.9 % IV SOLN
1.5000 g | Freq: Once | INTRAVENOUS | Status: AC
  Administered 2016-09-26: 1.5 g via INTRAVENOUS
  Filled 2016-09-26: qty 1.5

## 2016-09-26 MED ORDER — PHENYLEPHRINE 40 MCG/ML (10ML) SYRINGE FOR IV PUSH (FOR BLOOD PRESSURE SUPPORT)
PREFILLED_SYRINGE | INTRAVENOUS | Status: DC | PRN
  Administered 2016-09-26: 80 ug via INTRAVENOUS
  Administered 2016-09-26 (×2): 120 ug via INTRAVENOUS
  Administered 2016-09-26: 80 ug via INTRAVENOUS

## 2016-09-26 MED ORDER — PROPOFOL 10 MG/ML IV BOLUS
INTRAVENOUS | Status: DC | PRN
  Administered 2016-09-26: 50 mg via INTRAVENOUS
  Administered 2016-09-26: 120 mg via INTRAVENOUS

## 2016-09-26 MED ORDER — LACTATED RINGERS IV SOLN
INTRAVENOUS | Status: DC | PRN
  Administered 2016-09-26 (×2): via INTRAVENOUS

## 2016-09-26 MED ORDER — EPHEDRINE SULFATE-NACL 50-0.9 MG/10ML-% IV SOSY
PREFILLED_SYRINGE | INTRAVENOUS | Status: DC | PRN
  Administered 2016-09-26 (×2): 10 mg via INTRAVENOUS

## 2016-09-26 MED ORDER — SODIUM CHLORIDE 0.9 % IV SOLN: INTRAVENOUS | Status: DC

## 2016-09-26 MED ORDER — LIDOCAINE 2% (20 MG/ML) 5 ML SYRINGE
INTRAMUSCULAR | Status: DC | PRN
  Administered 2016-09-26: 40 mg via INTRAVENOUS

## 2016-09-26 MED ORDER — INDOMETHACIN 50 MG RE SUPP: 100.0000 mg | Freq: Once | RECTAL | Status: DC

## 2016-09-26 MED ORDER — COLESTIPOL HCL 5 G PO PACK: 5.0000 g | PACK | Freq: Two times a day (BID) | ORAL | 1 refills | Status: DC

## 2016-09-26 MED ORDER — PHENYLEPHRINE HCL 10 MG/ML IJ SOLN
INTRAMUSCULAR | Status: DC | PRN
  Administered 2016-09-26: 25 ug/min via INTRAVENOUS

## 2016-09-26 MED ORDER — ONDANSETRON HCL 4 MG/2ML IJ SOLN
INTRAMUSCULAR | Status: DC | PRN
  Administered 2016-09-26: 4 mg via INTRAVENOUS

## 2016-09-26 MED ORDER — SUCCINYLCHOLINE CHLORIDE 200 MG/10ML IV SOSY
PREFILLED_SYRINGE | INTRAVENOUS | Status: DC | PRN
  Administered 2016-09-26: 120 mg via INTRAVENOUS

## 2016-09-26 NOTE — Telephone Encounter (Signed)
Per Dr. Carlean Purl patient needs a percutaneous biliary drain placed by IR on Monday or Tuesday of next week.  I have left a voicemail for IR at [facility] and [facility] to schedule.

## 2016-09-26 NOTE — Transfer of Care (Signed)
Immediate Anesthesia Transfer of Care Note  Patient: Patricia Austin  Procedure(s) Performed: Procedure(s): ESOPHAGOGASTRODUODENOSCOPY (EGD) (N/A) ABORTED ERCP  Patient Location: Endoscopy Unit  Anesthesia Type:General  Level of Consciousness: awake, alert , oriented and patient cooperative  Airway & Oxygen Therapy: Patient Spontanous Breathing and Patient connected to nasal cannula oxygen  Post-op Assessment: Report given to RN and Post -op Vital signs reviewed and stable  Post vital signs: Reviewed and stable  Last Vitals:  Vitals:   09/26/16 1206 09/26/16 1508  BP: 129/63 (!) 165/67  Pulse: 71 89  Resp: 16 20  Temp: 36.8 C 36.8 C    Last Pain:  Vitals:   09/26/16 1508  TempSrc: Oral         Complications: No apparent anesthesia complications

## 2016-09-26 NOTE — Anesthesia Procedure Notes (Signed)
Procedure Name: Intubation Date/Time: 09/26/2016 1:55 PM Performed by: Everlean Cherry A Pre-anesthesia Checklist: Patient identified, Emergency Drugs available, Suction available and Patient being monitored Patient Re-evaluated:Patient Re-evaluated prior to inductionOxygen Delivery Method: Circle system utilized Preoxygenation: Pre-oxygenation with 100% oxygen Intubation Type: IV induction Laryngoscope Size: Glidescope and 4 Grade View: Grade II Tube type: Oral Tube size: 7.0 mm Number of attempts: 1 Airway Equipment and Method: Video-laryngoscopy and Rigid stylet Placement Confirmation: ETT inserted through vocal cords under direct vision,  positive ETCO2 and breath sounds checked- equal and bilateral Secured at: 22 cm Tube secured with: Tape Dental Injury: Teeth and Oropharynx as per pre-operative assessment  Difficulty Due To: Difficulty was anticipated, Difficult Airway- due to reduced neck mobility and Difficult Airway- due to anterior larynx

## 2016-09-26 NOTE — Anesthesia Postprocedure Evaluation (Signed)
Anesthesia Post Note  Patient: Patricia Austin  Procedure(s) Performed: Procedure(s) (LRB): ESOPHAGOGASTRODUODENOSCOPY (EGD) (N/A) ABORTED ERCP  Patient location during evaluation: Endoscopy Anesthesia Type: General Level of consciousness: awake and alert Pain management: pain level controlled Vital Signs Assessment: post-procedure vital signs reviewed and stable Respiratory status: spontaneous breathing Cardiovascular status: stable Postop Assessment: no signs of nausea or vomiting Anesthetic complications: no Comments: No damage noted to tongue or mouth during this procedure including intubation.       Last Vitals:  Vitals:   09/26/16 1518 09/26/16 1528  BP: (!) 162/65 (!) 152/57  Pulse: 85 80  Resp: 20 (!) 21  Temp:      Last Pain:  Vitals:   09/26/16 1508  TempSrc: Oral                 Jolly Mango

## 2016-09-26 NOTE — Interval H&P Note (Signed)
History and Physical Interval Note:  09/26/2016 1:11 PM  Patricia Austin  has presented today for surgery, with the diagnosis of pancreatic mass  The various methods of treatment have been discussed with the patient and family. After consideration of risks, benefits and other options for treatment, the patient has consented to  Procedure(s): ENDOSCOPIC RETROGRADE CHOLANGIOPANCREATOGRAPHY (ERCP) (N/A) as a surgical intervention .  The patient's history has been reviewed, patient examined, no change in status, stable for surgery.  I have reviewed the patient's chart and labs.  Questions were answered to the patient's satisfaction.     Silvano Rusk

## 2016-09-26 NOTE — Discharge Instructions (Addendum)
° °  I was not able to drain the bile duct so we will get an appointment with radiology to try to help that.  You had some ulcers in the duodenum (intestine below the stomach) - probably from diclofenac.  I have prescribed pantoprazole to treat those and if you are able to stop the diclofenac you should.  I have also prescribed colestipol to treat the itching - it says take twice a day but it can be tricky to avoid binding medications - have to take 1 hour before or 4 hours after medication so might only be able to take 1 time a day so that would be ok.  I appreciate the opportunity to care for you. Gatha Mayer, MD, FACG  YOU HAD AN ENDOSCOPIC PROCEDURE TODAY: Refer to the procedure report and other information in the discharge instructions given to you for any specific questions about what was found during the examination. If this information does not answer your questions, please call Dr. Celesta Aver office at [phone] to clarify.   YOU SHOULD EXPECT: Some feelings of bloating in the abdomen. Passage of more gas than usual. Walking can help get rid of the air that was put into your GI tract during the procedure and reduce the bloating. If you had a lower endoscopy (such as a colonoscopy or flexible sigmoidoscopy) you may notice spotting of blood in your stool or on the toilet paper. Some abdominal soreness may be present for a day or two, also.  DIET: Your first meal following the procedure should be a light meal and then it is ok to progress to your normal diet. A half-sandwich or bowl of soup is an example of a good first meal. Heavy or fried foods are harder to digest and may make you feel nauseous or bloated. Drink plenty of fluids but you should avoid alcoholic beverages for 24 hours.   ACTIVITY: Your care partner should take you home directly after the procedure. You should plan to take it easy, moving slowly for the rest of the day. You can resume normal activity the day after the  procedure however YOU SHOULD NOT DRIVE, use power tools, machinery or perform tasks that involve climbing or major physical exertion for 24 hours (because of the sedation medicines used during the test).   SYMPTOMS TO REPORT IMMEDIATELY: A gastroenterologist can be reached at any hour. Please call [phone]  for any of the following symptoms:   Following upper endoscopy (EGD, EUS, ERCP, esophageal dilation) Vomiting of blood or coffee ground material  New, significant abdominal pain  New, significant chest pain or pain under the shoulder blades  Painful or persistently difficult swallowing  New shortness of breath  Black, tarry-looking or red, bloody stools

## 2016-09-26 NOTE — H&P (View-Only) (Signed)
HPI: Patricia Austin is a 71 year old female with a past medical history of GERD, hypertension, diabetes, depression, lower back pain who is seen in consultation at the request of Chevis Pretty, FNP to evaluate abnormal pancreatic imaging. She is here today with her husband.  She reports that over the last 2 months she has been dealing with intermittent upper abdominal pain. This would come and go and was located bilaterally in the epigastrium. She was concerned that she may develop shingles but never developed a rash. The pain portion has actually see somewhat improved. She developed intense diffuse pruritus. No clear cause initially and she was treated with Benadryl and Atarax.  This past Saturday she developed dizziness along with facial numbness and left arm numbness. She went to the ER by 911 where she was ruled out for CVA. She was noted to be jaundiced and had an abdominal CT scan showing a pancreatic mass in the uncinate process with intra and extrahepatic delivery ductal dilatation.  Today she denies abdominal pain. She reports being told by her family that her scans look yellow. Her stools of been slightly looser and lighter in color. Her urine has been dark. Itching has continued. No nausea or vomiting. No fevers or chills. She has lost about 30 pounds in the last 2-3 months. She had back surgery in February, lumbar fusions. She had a tongue injury due to intubation and has been eating soft foods since this time.  She had a colonoscopy 11 years ago which she reports was normal at [facility]  Her father died from coronary artery disease/7 cardiac death at age 67. Her mother died at age 51 in a motor vehicle accident  Past Medical History:  Diagnosis Date  . Anxiety    "not all the time"   . Chronic lower back pain   . Complication of anesthesia 2002   difficulty /w intubation, with plastic surgery , because she was feeling so anxious. 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 .   Pt.  reports that she  is very sensitive to med. & is hard  to wake up.  . Depression    "not all the time"   . Difficult intubation   . Dysrhythmia    told that she sometimes has extra beats. Does not see cardiologist, had a stress test many yrs. ago>10 yrs.   . Esophageal dysmotility    "getting worse recenly" (12/15/2013)  . Fibromyalgia   . GERD (gastroesophageal reflux disease)   . Hypertension   . Nephritis ~ 1955  . Osteoarthritis    knees   . Seasonal allergies    "in the spring"  . Shortness of breath    walking long distances   . squamous cell carcinoma scalp    squamous, on scalp, & has had several surgeries on scalp, for plastics repair of scalp   . Type II diabetes mellitus ([place])     Past Surgical History:  Procedure Laterality Date  . ABDOMINAL HYSTERECTOMY    . APPENDECTOMY    . BACK SURGERY    . CARPAL TUNNEL RELEASE Right   . CARPAL TUNNEL RELEASE Left   . COLONOSCOPY    . CYST EXCISION  1990's?   scalp  . LUMBAR DISC SURGERY  ~ 2012 X 2  . SKIN GRAFT  2002   scalp; "went to [facility] to repair OR earlier in the yearr"  . SQUAMOUS CELL CARCINOMA EXCISION  2002   "off scalp; cancerous area removed and stapled;  Dr. Georgia Lopes"  . TONSILLECTOMY    . TOTAL KNEE ARTHROPLASTY Left 12/14/2013   Procedure: LEFT TOTAL KNEE ARTHROPLASTY;  Surgeon: Ninetta Lights, MD;  Location: [facility];  Service: Orthopedics;  Laterality: Left;  . TOTAL KNEE ARTHROPLASTY Left 12/15/2013    Outpatient Medications Prior to Visit  Medication Sig Dispense Refill  . amLODipine (NORVASC) 5 MG tablet Take 1 tablet (5 mg total) by mouth daily. (Patient taking differently: Take 5 mg by mouth at bedtime. ) 90 tablet 1  . benazepril (LOTENSIN) 40 MG tablet Take 1 tablet (40 mg total) by mouth daily before breakfast. (Patient taking differently: Take 40 mg by mouth at bedtime. ) 90 tablet 1  . carvedilol (COREG) 25 MG tablet TAKE  (1)  TABLET TWICE A DAY WITH MEALS (BREAKFAST AND SUPPER) (Patient taking  differently: Take 25 mg by mouth 2 (two) times daily. ) 180 tablet 1  . diclofenac (VOLTAREN) 75 MG EC tablet TAKE (1) TABLET TWICE A DAY. (Patient taking differently: Take 75 mg by mouth at bedtime. ) 180 tablet 1  . DULoxetine (CYMBALTA) 60 MG capsule Take 1 capsule (60 mg total) by mouth 2 (two) times daily. (Patient taking differently: Take 60 mg by mouth at bedtime. ) 180 capsule 1  . furosemide (LASIX) 20 MG tablet Take 1 tablet (20 mg total) by mouth daily. (Patient taking differently: Take 20 mg by mouth 4 (four) times a week. ) 90 tablet 1  . gabapentin (NEURONTIN) 300 MG capsule TAKE (1) CAPSULE TWICE DAILY. (Patient taking differently: Take 300 mg by mouth at bedtime. ) 60 capsule 4  . glipiZIDE-metformin (METAGLIP) 5-500 MG tablet Take 2 tablets by mouth 2 (two) times daily before a meal. (Patient taking differently: Take 2 tablets by mouth at bedtime. ) 360 tablet 1  . glucose blood (ONE TOUCH ULTRA TEST) test strip Test BID and prn  E11.42 100 each 12  . Insulin Glargine (TOUJEO SOLOSTAR) 300 UNIT/ML SOPN Inject 60 Units into the skin daily. (Patient taking differently: Inject 40-50 Units into the skin at bedtime. ) 6 pen 5  . Insulin Pen Needle (INSUPEN PEN NEEDLES) 32G X 4 MM MISC 1 each by Does not apply route daily. 100 each 3  . Lancet Devices (ONE TOUCH DELICA LANCING DEV) MISC 1 each by Does not apply route 2 (two) times daily. Dx   E11.42 100 each 5  . aspirin 81 MG tablet Take 81 mg by mouth daily. Reported on 10/23/2015    . HYDROcodone-acetaminophen (NORCO) 7.5-325 MG tablet Take 1 tablet by mouth every 6 (six) hours as needed for moderate pain. (Patient not taking: Reported on 09/24/2016) 30 tablet 0  . oxyCODONE-acetaminophen (ROXICET) 5-325 MG tablet Take 1-2 tablets by mouth every 4 (four) hours as needed. (Patient not taking: Reported on 09/24/2016) 40 tablet 0  . Cholecalciferol (VITAMIN D3) 5000 UNITS CAPS Take 5,000 Units by mouth daily.    . Cyanocobalamin (VITAMIN B-12)  5000 MCG SUBL Place 5,000 mcg under the tongue 3 (three) times a week.    . cyclobenzaprine (FLEXERIL) 10 MG tablet Take 1 tablet (10 mg total) by mouth 3 (three) times daily as needed for muscle spasms. (Patient not taking: Reported on 09/20/2016) 45 tablet 0  . hydrOXYzine (ATARAX/VISTARIL) 10 MG tablet Take 1 tablet (10 mg total) by mouth 3 (three) times daily as needed. (Patient not taking: Reported on 09/20/2016) 30 tablet 2  . loratadine (ALLERGY) 10 MG tablet Take 10 mg by mouth daily. For  itching     No facility-administered medications prior to visit.     Allergies  Allergen Reactions  . No Known Allergies     Family History  Problem Relation Age of Onset  . Early death Mother   . Thrombosis Father     Social History  Substance Use Topics  . Smoking status: Never Smoker  . Smokeless tobacco: Never Used  . Alcohol use Yes     Comment: 12/15/2013 "a couple times of year"    ROS: As per history of present illness, otherwise negative  BP (!) 110/50   Pulse 78   Resp 20   Ht 5\' 3"  (1.6 m)   Wt 188 lb (85.3 kg)   BMI 33.30 kg/m  Constitutional: Well-developed and well-nourished. No distress. HEENT: Normocephalic and atraumatic. Oropharynx is clear and moist. No oropharyngeal exudate. Conjunctivae are normal.  + scleral icterus Neck: Neck supple. Trachea midline. Cardiovascular: Normal rate, regular rhythm and intact distal pulses. No M/R/G Pulmonary/chest: Effort normal and breath sounds normal. No wheezing, rales or rhonchi. Abdominal: Soft, Epigastric firmness though nontender, nondistended. Bowel sounds active throughout.  Extremities: no clubbing, cyanosis, or edema Lymphadenopathy: No cervical adenopathy noted. Neurological: Alert and oriented to person place and time. Skin: Skin is warm and dry. Jaundice Psychiatric: Normal mood and affect. Behavior is normal.  RELEVANT LABS AND IMAGING: CBC    Component Value Date/Time   WBC 6.6 09/20/2016 1314   RBC 3.57  (L) 09/20/2016 1314   HGB 11.4 (L) 09/20/2016 1314   HCT 34.8 (L) 09/20/2016 1314   PLT 295 09/20/2016 1314   MCV 97.5 09/20/2016 1314   MCH 31.9 09/20/2016 1314   MCHC 32.8 09/20/2016 1314   RDW 15.3 09/20/2016 1314   LYMPHSABS 1.1 09/20/2016 1314   MONOABS 0.5 09/20/2016 1314   EOSABS 0.1 09/20/2016 1314   BASOSABS 0.0 09/20/2016 1314    CMP     Component Value Date/Time   NA 135 09/20/2016 1314   NA 141 07/18/2016 1046   K 4.2 09/20/2016 1314   CL 101 09/20/2016 1314   CO2 25 09/20/2016 1314   GLUCOSE 168 (H) 09/20/2016 1314   BUN 22 (H) 09/20/2016 1314   BUN 19 07/18/2016 1046   CREATININE 1.27 (H) 09/20/2016 1314   CALCIUM 9.8 09/20/2016 1314   PROT 6.6 09/20/2016 1314   PROT 6.7 07/18/2016 1046   ALBUMIN 2.7 (L) 09/20/2016 1314   ALBUMIN 4.1 07/18/2016 1046   AST 53 (H) 09/20/2016 1314   ALT 42 09/20/2016 1314   ALKPHOS 609 (H) 09/20/2016 1314   BILITOT 4.2 (H) 09/20/2016 1314   BILITOT 0.3 07/18/2016 1046   GFRNONAA 41 (L) 09/20/2016 1314   GFRAA 48 (L) 09/20/2016 1314    ASSESSMENT/PLAN: 71 year old female with a past medical history of GERD, hypertension, diabetes, depression, lower back pain who is seen in consultation at the request of Chevis Pretty, FNP to evaluate abnormal pancreatic imaging.   1. 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 Jaundice and itching all relate to cholestasis due to the pancreatic mass compression of CBD. I recommended ERCP for biliary brushing and biliary stenting. If nondiagnostic for malignancy she will need EUS with FNA. She is nontoxic-appearing with no evidence for cholangitis. I will discuss ERCP with colleagues to try to arrange ASAP. After this she will need oncology and surgery referrals.  For  her itching I recommended that she use a fragrance free moisturizing  lotion and Atarax or Benadryl as needed. I expect itching to improve once bilary obstruction relieved  CBC and CMP pending from today  NG:[id number] [address], [address] [place], [place] [address]

## 2016-09-26 NOTE — Anesthesia Preprocedure Evaluation (Addendum)
Anesthesia Evaluation  Patient identified by MRN, date of birth, ID band Patient awake    Reviewed: Allergy & Precautions, H&P , NPO status , Patient's Chart, lab work & pertinent test results, reviewed documented beta blocker date and time   History of Anesthesia Complications (+) DIFFICULT AIRWAY and history of anesthetic complications  Airway Mallampati: III   Neck ROM: full    Dental  (+) Teeth Intact, Caps   Pulmonary shortness of breath,    breath sounds clear to auscultation       Cardiovascular hypertension, + dysrhythmias  Rhythm:Regular Rate:Normal     Neuro/Psych PSYCHIATRIC DISORDERS Anxiety Depression    GI/Hepatic GERD  ,  Endo/Other  diabetes, Type 2  Renal/GU Renal disease     Musculoskeletal  (+) Arthritis , Fibromyalgia -  Abdominal   Peds  Hematology   Anesthesia Other Findings Pt was intubated in 2015 with MAC 3 laryngoscope and grade 2 view.  Easy mask ventilation was noted. Intubated easy on 08/20/16 with glidescope, 6.5 ETT. Patient reported tongue injuri with last intubation. Cap on upper incisor  Reproductive/Obstetrics                            Anesthesia Physical  Anesthesia Plan  ASA: III  Anesthesia Plan: General   Post-op Pain Management:    Induction: Intravenous  Airway Management Planned: Oral ETT and Video Laryngoscope Planned  Additional Equipment:   Intra-op Plan:   Post-operative Plan: Extubation in OR  Informed Consent: I have reviewed the patients History and Physical, chart, labs and discussed the procedure including the risks, benefits and alternatives for the proposed anesthesia with the patient or authorized representative who has indicated his/her understanding and acceptance.   Dental advisory given  Plan Discussed with: CRNA, Anesthesiologist and Surgeon  Anesthesia Plan Comments:        Anesthesia Quick Evaluation

## 2016-09-26 NOTE — Op Note (Signed)
[facility] Patient Name: Patricia Austin Procedure Date : 09/26/2016 MRN: [id number] Attending MD: Gatha Mayer , MD Date of Birth: 1945-11-03 CSN: [id number] Age: 71 Admit Type: Outpatient Procedure:                ERCP Indications:              Tumor of the head of pancreas Providers:                Gatha Mayer, MD, Dortha Schwalbe RN, RN, Cletis Athens, Technician Referring MD:              Medicines:                General Anesthesia Complications:            No immediate complications. Estimated Blood Loss:     Estimated blood loss was minimal. Procedure:                Pre-Anesthesia Assessment:                           - Prior to the procedure, a History and Physical                            was performed, and patient medications and                            allergies were reviewed. The patient's tolerance of                            previous anesthesia was also reviewed. The risks                            and benefits of the procedure and the sedation                            options and risks were discussed with the patient.                            All questions were answered, and informed consent                            was obtained. Prior Anticoagulants: The patient                            last took previous NSAID medication 1 day prior to                            the procedure. ASA Grade Assessment: III - A                            patient with severe systemic disease. After  reviewing the risks and benefits, the patient was                            deemed in satisfactory condition to undergo the                            procedure.                           After obtaining informed consent, the scope was                            passed under direct vision. Throughout the                            procedure, the patient's blood pressure, pulse, and   oxygen saturations were monitored continuously. The                            [id number] ([id number]) scope was introduced through                            the mouth, and used to inject contrast into and                            used to inject contrast into the bile duct. The                            ERCP was technically difficult and complex due to                            challenging cannulation because of inability to                            visualize the major papilla. The ERCP was aborted                            due to the difficulty of the procedure. Withdrawing                            the scope and replacing with the adult endoscope                            did not allow for the successful completion of the                            procedure. Scope In: Scope Out: Findings:      Tortuous and spastic esophagus. Stomach normal - bile deen. Duodenal       bulb with a few clean-based ulcers with slightly heaped up edges -       biopsied. The duodenum itself in second portion is very distorted and       there were at least 2 diverticula - large and small - one medial and one  lateral - I could never find the papilla. Some bile seen. Edematous       folds. Deformity. Impression:               - The ERCP was aborted due to the difficulty of the                            procedure.                           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 The signs and symptoms of potential                            delayed complications were discussed with the                            patient. Return to normal activities tomorrow.                            Written discharge instructions were provided to the                             patient.                           - Use Protonix (pantoprazole) 40 mg PO daily.                           - colestipol 5 g qd-bid for pruritis                           - Will arrange interventional radiology appointment                            for next week. Percutaneous biliary drain to treat                            biliary obstruction from pancreatic mass.                           - Stop diclofenac if able - likely cause of ulcers Procedure Code(s):        --- Professional ---                           43261, 53, Endoscopic retrograde  cholangiopancreatography (ERCP); with biopsy,                            single or multiple Diagnosis Code(s):        --- Professional ---                           Z53.8, Procedure and treatment not carried out for                            other reasons                           D49.0, Neoplasm of unspecified behavior of                            digestive system CPT copyright 2016 [employer] Association. All rights reserved. The codes documented in this report are preliminary and upon coder review may  be revised to meet current compliance requirements. Gatha Mayer, MD 09/26/2016 3:16:18 PM This report has been signed electronically. Number of Addenda: 0

## 2016-09-26 NOTE — Telephone Encounter (Addendum)
Per Dr. Carlean Purl patient needs to have a percutaneous biliary drain on Monday or Tuesday

## 2016-09-28 ENCOUNTER — Encounter (HOSPITAL_COMMUNITY): Payer: Self-pay | Admitting: Internal Medicine

## 2016-09-29 ENCOUNTER — Ambulatory Visit (HOSPITAL_COMMUNITY)
Admission: RE | Admit: 2016-09-29 | Discharge: 2016-09-29 | Disposition: A | Discharge status: Home / Self Care | Payer: Medicare Other | Source: Ambulatory Visit | Attending: Diagnostic Radiology | Admitting: Diagnostic Radiology

## 2016-09-29 ENCOUNTER — Inpatient Hospital Stay (HOSPITAL_COMMUNITY)
Admission: AD | Admit: 2016-09-29 | Discharge: 2016-10-02 | DRG: 444 | Disposition: A | Discharge status: Home / Self Care | Payer: Medicare Other | Attending: Internal Medicine | Admitting: Internal Medicine

## 2016-09-29 ENCOUNTER — Observation Stay (HOSPITAL_COMMUNITY): Payer: Medicare Other

## 2016-09-29 ENCOUNTER — Encounter (HOSPITAL_COMMUNITY): Payer: Self-pay

## 2016-09-29 ENCOUNTER — Other Ambulatory Visit: Payer: Self-pay | Admitting: Radiology

## 2016-09-29 DIAGNOSIS — K8021 Calculus of gallbladder without cholecystitis with obstruction: Principal | ICD-10-CM | POA: Diagnosis present

## 2016-09-29 DIAGNOSIS — E1165 Type 2 diabetes mellitus with hyperglycemia: Secondary | ICD-10-CM | POA: Diagnosis present

## 2016-09-29 DIAGNOSIS — K830 Cholangitis: Secondary | ICD-10-CM | POA: Diagnosis not present

## 2016-09-29 DIAGNOSIS — E1122 Type 2 diabetes mellitus with diabetic chronic kidney disease: Secondary | ICD-10-CM | POA: Diagnosis present

## 2016-09-29 DIAGNOSIS — R8589 Other abnormal findings in specimens from digestive organs and abdominal cavity: Secondary | ICD-10-CM | POA: Diagnosis not present

## 2016-09-29 DIAGNOSIS — R509 Fever, unspecified: Secondary | ICD-10-CM

## 2016-09-29 DIAGNOSIS — E1142 Type 2 diabetes mellitus with diabetic polyneuropathy: Secondary | ICD-10-CM

## 2016-09-29 DIAGNOSIS — K8689 Other specified diseases of pancreas: Secondary | ICD-10-CM | POA: Diagnosis present

## 2016-09-29 DIAGNOSIS — I10 Essential (primary) hypertension: Secondary | ICD-10-CM | POA: Diagnosis not present

## 2016-09-29 DIAGNOSIS — M545 Low back pain: Secondary | ICD-10-CM | POA: Diagnosis present

## 2016-09-29 DIAGNOSIS — M797 Fibromyalgia: Secondary | ICD-10-CM | POA: Diagnosis present

## 2016-09-29 DIAGNOSIS — K219 Gastro-esophageal reflux disease without esophagitis: Secondary | ICD-10-CM | POA: Diagnosis present

## 2016-09-29 DIAGNOSIS — N183 Chronic kidney disease, stage 3 unspecified: Secondary | ICD-10-CM | POA: Diagnosis present

## 2016-09-29 DIAGNOSIS — K831 Obstruction of bile duct: Secondary | ICD-10-CM | POA: Diagnosis present

## 2016-09-29 DIAGNOSIS — R410 Disorientation, unspecified: Secondary | ICD-10-CM

## 2016-09-29 DIAGNOSIS — N179 Acute kidney failure, unspecified: Secondary | ICD-10-CM | POA: Diagnosis present

## 2016-09-29 DIAGNOSIS — K869 Disease of pancreas, unspecified: Secondary | ICD-10-CM

## 2016-09-29 DIAGNOSIS — E785 Hyperlipidemia, unspecified: Secondary | ICD-10-CM | POA: Diagnosis present

## 2016-09-29 DIAGNOSIS — G8929 Other chronic pain: Secondary | ICD-10-CM | POA: Diagnosis present

## 2016-09-29 DIAGNOSIS — F419 Anxiety disorder, unspecified: Secondary | ICD-10-CM | POA: Diagnosis present

## 2016-09-29 DIAGNOSIS — Z79899 Other long term (current) drug therapy: Secondary | ICD-10-CM

## 2016-09-29 DIAGNOSIS — F329 Major depressive disorder, single episode, unspecified: Secondary | ICD-10-CM | POA: Diagnosis present

## 2016-09-29 DIAGNOSIS — Z90710 Acquired absence of both cervix and uterus: Secondary | ICD-10-CM

## 2016-09-29 DIAGNOSIS — Z794 Long term (current) use of insulin: Secondary | ICD-10-CM

## 2016-09-29 DIAGNOSIS — Z85828 Personal history of other malignant neoplasm of skin: Secondary | ICD-10-CM

## 2016-09-29 DIAGNOSIS — G9341 Metabolic encephalopathy: Secondary | ICD-10-CM | POA: Diagnosis present

## 2016-09-29 DIAGNOSIS — K224 Dyskinesia of esophagus: Secondary | ICD-10-CM | POA: Diagnosis present

## 2016-09-29 DIAGNOSIS — I129 Hypertensive chronic kidney disease with stage 1 through stage 4 chronic kidney disease, or unspecified chronic kidney disease: Secondary | ICD-10-CM | POA: Diagnosis present

## 2016-09-29 DIAGNOSIS — R918 Other nonspecific abnormal finding of lung field: Secondary | ICD-10-CM | POA: Diagnosis present

## 2016-09-29 DIAGNOSIS — K760 Fatty (change of) liver, not elsewhere classified: Secondary | ICD-10-CM | POA: Diagnosis present

## 2016-09-29 DIAGNOSIS — A419 Sepsis, unspecified organism: Secondary | ICD-10-CM | POA: Diagnosis not present

## 2016-09-29 DIAGNOSIS — R4182 Altered mental status, unspecified: Secondary | ICD-10-CM

## 2016-09-29 DIAGNOSIS — E119 Type 2 diabetes mellitus without complications: Secondary | ICD-10-CM

## 2016-09-29 DIAGNOSIS — Z9049 Acquired absence of other specified parts of digestive tract: Secondary | ICD-10-CM

## 2016-09-29 DIAGNOSIS — L299 Pruritus, unspecified: Secondary | ICD-10-CM | POA: Diagnosis present

## 2016-09-29 HISTORY — PX: IR INT EXT BILIARY DRAIN WITH CHOLANGIOGRAM: IMG6044

## 2016-09-29 LAB — CBC WITH DIFFERENTIAL/PLATELET
Basophils Absolute: 0 10*3/uL (ref 0.0–0.1)
Basophils Relative: 0 %
EOS ABS: 0.1 10*3/uL (ref 0.0–0.7)
Eosinophils Relative: 1 %
HCT: 36.2 % (ref 36.0–46.0)
HEMOGLOBIN: 12.1 g/dL (ref 12.0–15.0)
LYMPHS ABS: 1.4 10*3/uL (ref 0.7–4.0)
LYMPHS PCT: 16 %
MCH: 31.8 pg (ref 26.0–34.0)
MCHC: 33.4 g/dL (ref 30.0–36.0)
MCV: 95 fL (ref 78.0–100.0)
Monocytes Absolute: 0.7 10*3/uL (ref 0.1–1.0)
Monocytes Relative: 8 %
NEUTROS PCT: 75 %
Neutro Abs: 6.7 10*3/uL (ref 1.7–7.7)
Platelets: 271 10*3/uL (ref 150–400)
RBC: 3.81 MIL/uL — AB (ref 3.87–5.11)
RDW: 13.7 % (ref 11.5–15.5)
WBC: 9 10*3/uL (ref 4.0–10.5)

## 2016-09-29 LAB — COMPREHENSIVE METABOLIC PANEL
ALK PHOS: 675 U/L — AB (ref 38–126)
ALT: 43 U/L (ref 14–54)
AST: 56 U/L — ABNORMAL HIGH (ref 15–41)
Albumin: 3.2 g/dL — ABNORMAL LOW (ref 3.5–5.0)
Anion gap: 9 (ref 5–15)
BUN: 14 mg/dL (ref 6–20)
CALCIUM: 10.1 mg/dL (ref 8.9–10.3)
CO2: 30 mmol/L (ref 22–32)
CREATININE: 1.15 mg/dL — AB (ref 0.44–1.00)
Chloride: 98 mmol/L — ABNORMAL LOW (ref 101–111)
GFR calc non Af Amer: 46 mL/min — ABNORMAL LOW (ref >60)
GFR, EST AFRICAN AMERICAN: 54 mL/min — AB (ref >60)
Glucose, Bld: 320 mg/dL — ABNORMAL HIGH (ref 65–99)
Potassium: 4 mmol/L (ref 3.5–5.1)
SODIUM: 137 mmol/L (ref 135–145)
Total Bilirubin: 4.5 mg/dL — ABNORMAL HIGH (ref 0.3–1.2)
Total Protein: 7.6 g/dL (ref 6.5–8.1)

## 2016-09-29 LAB — GLUCOSE, CAPILLARY
GLUCOSE-CAPILLARY: 183 mg/dL — AB (ref 65–99)
GLUCOSE-CAPILLARY: 156 mg/dL — AB (ref 65–99)
Glucose-Capillary: 121 mg/dL — ABNORMAL HIGH (ref 65–99)
Glucose-Capillary: 308 mg/dL — ABNORMAL HIGH (ref 65–99)

## 2016-09-29 LAB — PROTIME-INR
INR: 0.9
Prothrombin Time: 12.2 seconds (ref 11.4–15.2)

## 2016-09-29 LAB — AMMONIA: Ammonia: 26 umol/L (ref 9–35)

## 2016-09-29 MED ORDER — PIPERACILLIN-TAZOBACTAM 3.375 G IVPB
INTRAVENOUS | Status: AC
  Filled 2016-09-29: qty 50

## 2016-09-29 MED ORDER — OXYCODONE-ACETAMINOPHEN 5-325 MG PO TABS: 1.0000 | ORAL_TABLET | ORAL | Status: DC | PRN

## 2016-09-29 MED ORDER — SODIUM CHLORIDE 0.9 % IV SOLN
INTRAVENOUS | Status: DC
  Administered 2016-09-29: 14:00:00 via INTRAVENOUS

## 2016-09-29 MED ORDER — GABAPENTIN 300 MG PO CAPS
300.0000 mg | ORAL_CAPSULE | Freq: Every day | ORAL | Status: DC
  Administered 2016-09-30 – 2016-10-01 (×2): 300 mg via ORAL
  Filled 2016-09-29 (×3): qty 1

## 2016-09-29 MED ORDER — SODIUM CHLORIDE 0.9 % IV BOLUS (SEPSIS)
500.0000 mL | INTRAVENOUS | Status: AC
  Administered 2016-09-29: 500 mL via INTRAVENOUS

## 2016-09-29 MED ORDER — OXYCODONE HCL 5 MG PO TABS
5.0000 mg | ORAL_TABLET | ORAL | Status: DC | PRN
  Administered 2016-09-29 – 2016-10-01 (×3): 5 mg via ORAL
  Filled 2016-09-29 (×3): qty 1

## 2016-09-29 MED ORDER — HYDRALAZINE HCL 20 MG/ML IJ SOLN
INTRAMUSCULAR | Status: AC
  Filled 2016-09-29: qty 1

## 2016-09-29 MED ORDER — COLESTIPOL HCL 5 G PO PACK
5.0000 g | PACK | Freq: Two times a day (BID) | ORAL | Status: DC
  Administered 2016-09-30 – 2016-10-02 (×5): 5 g via ORAL
  Filled 2016-09-29 (×6): qty 1

## 2016-09-29 MED ORDER — INSULIN ASPART 100 UNIT/ML ~~LOC~~ SOLN
0.0000 [IU] | Freq: Every day | SUBCUTANEOUS | Status: DC
  Administered 2016-09-30: 4 [IU] via SUBCUTANEOUS
  Administered 2016-10-01: 2 [IU] via SUBCUTANEOUS

## 2016-09-29 MED ORDER — MIDAZOLAM HCL 2 MG/2ML IJ SOLN
INTRAMUSCULAR | Status: AC
  Filled 2016-09-29: qty 6

## 2016-09-29 MED ORDER — ONDANSETRON HCL 4 MG/2ML IJ SOLN: 4.0000 mg | Freq: Four times a day (QID) | INTRAMUSCULAR | Status: DC | PRN

## 2016-09-29 MED ORDER — FENTANYL CITRATE (PF) 100 MCG/2ML IJ SOLN
INTRAMUSCULAR | Status: AC | PRN
  Administered 2016-09-29 (×2): 50 ug via INTRAVENOUS

## 2016-09-29 MED ORDER — CARVEDILOL 25 MG PO TABS
25.0000 mg | ORAL_TABLET | Freq: Two times a day (BID) | ORAL | Status: DC
  Administered 2016-09-29 – 2016-10-02 (×6): 25 mg via ORAL
  Filled 2016-09-29 (×6): qty 1

## 2016-09-29 MED ORDER — DULOXETINE HCL 60 MG PO CPEP
60.0000 mg | ORAL_CAPSULE | Freq: Every day | ORAL | Status: DC
  Administered 2016-09-30 – 2016-10-01 (×2): 60 mg via ORAL
  Filled 2016-09-29 (×3): qty 1

## 2016-09-29 MED ORDER — PANTOPRAZOLE SODIUM 40 MG PO TBEC
40.0000 mg | DELAYED_RELEASE_TABLET | Freq: Every day | ORAL | Status: DC
  Administered 2016-09-30 – 2016-10-02 (×3): 40 mg via ORAL
  Filled 2016-09-29 (×3): qty 1

## 2016-09-29 MED ORDER — ACETAMINOPHEN 325 MG PO TABS: 650.0000 mg | ORAL_TABLET | ORAL | Status: DC | PRN

## 2016-09-29 MED ORDER — IOPAMIDOL (ISOVUE-300) INJECTION 61%
INTRAVENOUS | Status: AC
  Filled 2016-09-29: qty 50

## 2016-09-29 MED ORDER — INSULIN ASPART 100 UNIT/ML ~~LOC~~ SOLN
0.0000 [IU] | Freq: Three times a day (TID) | SUBCUTANEOUS | Status: DC
  Administered 2016-09-30: 8 [IU] via SUBCUTANEOUS
  Administered 2016-09-30: 2 [IU] via SUBCUTANEOUS
  Administered 2016-10-01 – 2016-10-02 (×3): 5 [IU] via SUBCUTANEOUS

## 2016-09-29 MED ORDER — AMLODIPINE BESYLATE 5 MG PO TABS
5.0000 mg | ORAL_TABLET | Freq: Every day | ORAL | Status: DC
  Administered 2016-09-29: 5 mg via ORAL
  Filled 2016-09-29: qty 1

## 2016-09-29 MED ORDER — HYDRALAZINE HCL 20 MG/ML IJ SOLN
INTRAMUSCULAR | Status: AC | PRN
  Administered 2016-09-29: 10 mg via INTRAVENOUS

## 2016-09-29 MED ORDER — FENTANYL CITRATE (PF) 100 MCG/2ML IJ SOLN
INTRAMUSCULAR | Status: AC
  Filled 2016-09-29: qty 4

## 2016-09-29 MED ORDER — ACETAMINOPHEN 650 MG RE SUPP
650.0000 mg | RECTAL | Status: AC
  Administered 2016-09-29: 650 mg via RECTAL
  Filled 2016-09-29: qty 1

## 2016-09-29 MED ORDER — ACETAMINOPHEN 650 MG RE SUPP
650.0000 mg | RECTAL | Status: DC | PRN
  Administered 2016-09-30: 325 mg via RECTAL
  Filled 2016-09-29: qty 1

## 2016-09-29 MED ORDER — MIDAZOLAM HCL 2 MG/2ML IJ SOLN
INTRAMUSCULAR | Status: AC | PRN
  Administered 2016-09-29 (×3): 1 mg via INTRAVENOUS

## 2016-09-29 MED ORDER — ONDANSETRON HCL 4 MG PO TABS: 4.0000 mg | ORAL_TABLET | Freq: Four times a day (QID) | ORAL | Status: DC | PRN

## 2016-09-29 MED ORDER — INSULIN GLARGINE 100 UNIT/ML ~~LOC~~ SOLN
35.0000 [IU] | Freq: Every day | SUBCUTANEOUS | Status: DC
  Filled 2016-09-29: qty 0.35

## 2016-09-29 MED ORDER — LIDOCAINE HCL 1 % IJ SOLN
INTRAMUSCULAR | Status: AC
  Filled 2016-09-29: qty 20

## 2016-09-29 MED ORDER — BENAZEPRIL HCL 10 MG PO TABS
40.0000 mg | ORAL_TABLET | Freq: Every day | ORAL | Status: DC
Start: 2016-09-29 — End: 2016-09-30
  Administered 2016-09-29: 40 mg via ORAL
  Filled 2016-09-29: qty 4

## 2016-09-29 MED ORDER — PIPERACILLIN-TAZOBACTAM 3.375 G IVPB
3.3750 g | Freq: Three times a day (TID) | INTRAVENOUS | Status: DC
  Administered 2016-09-29: 3.375 g via INTRAVENOUS

## 2016-09-29 MED ORDER — INSULIN GLARGINE 100 UNIT/ML ~~LOC~~ SOLN
20.0000 [IU] | Freq: Once | SUBCUTANEOUS | Status: AC
  Administered 2016-09-29: 20 [IU] via SUBCUTANEOUS
  Filled 2016-09-29: qty 0.2

## 2016-09-29 NOTE — Sedation Documentation (Signed)
Staff assisting with patient. Due to confusion.

## 2016-09-29 NOTE — Telephone Encounter (Signed)
I spoke with IR this am. They are having the radiologist review the case and will call me back with a time. I also spoke with the patient and asked that she be NPO for now and that I will call back with instructions for procedure at [facility].

## 2016-09-29 NOTE — Progress Notes (Signed)
RN notified Brynda Greathouse, PA patient current vital signs.

## 2016-09-29 NOTE — Progress Notes (Signed)
Pt returned from procedure alert and oriented.Shortly after she started having shaking chills. T-97.7 an hour later she was 100.4. Several blankets were removed and she  Came down to 99.9. Her biliary drain 120 ml green liquid. Dressing d/I. Pt voided around 2200, 140 ml amber urine. Pt encouraged to drink fluids. Bp 195/70 @ 1900.Bp 150/70 @2000 . Bp meds given early. Husband at bedside.

## 2016-09-29 NOTE — Progress Notes (Addendum)
Hospitalist team asked by IR to admit Patricia Austin for observation overnight. She is a 71 yo F with recent diagnosis of pancreatic mass concerning for malignancy with biliary obstruction getting a PTC drain in IR. Procedure is just starting and patient is not available at this moment. After the procedure she will go to 1328.  RN, on patient arrival, please call [phone] and let patient placement RN know of the patient's arrival and a hospitalist will be assigned to admit the patient.   [name] M. Cruzita Lederer, MD [facility] [phone]  If 7PM-7AM, please contact night-coverage [email or web address] Password [facility]  08/01/2016, 5:37 AM

## 2016-09-29 NOTE — Telephone Encounter (Signed)
Patient is notified to arrive at 1st floor radiology at [facility] today at 1:15 and be NPO.  She is notified that she will stay the night.

## 2016-09-29 NOTE — Sedation Documentation (Signed)
Patient is resting comfortably. 

## 2016-09-29 NOTE — Procedures (Signed)
  Pre-operative Diagnosis: Pancreatic mass and biliary obstruction       Post-operative Diagnosis: Pancreatic mass and biliary obstruction   Indications: Needs biliary decompression  Procedure: PTC and placement of internal/external biliary drain  Findings: Mild intrahepatic biliary dilatation.  Successful placement of left sided drain.  Patient was very combative and confused during the procedure.  Patient had to be restrained in order to complete the procedure and no opportunity to perform a biopsy.   Complications: No immediate     EBL: Minimal  Plan: Observe overnight for pain management and potential bacteremia.  Will need brush biopsy or EUS in the future.  Follow liver labs.

## 2016-09-29 NOTE — H&P (Addendum)
History and Physical    Patricia Austin EEF:[id number] DOB: 16-Aug-1945 DOA: 09/29/2016  I have briefly reviewed the patient's prior medical records in [place]  PCP: Chevis Pretty, FNP  Patient coming from: Home  Chief Complaint: Pancreatic mass and biliary obstruction  HPI: Patricia Austin is a 71 y.o. female with medical history significant of hypertension, type 2 diabetes mellitus, poorly controlled, obesity, hyperlipidemia, fibromyalgia, chronic kidney disease stage III, who is admitted to the hospital from interventional radiology following PTC drain placement.  Patient has been having complaints of itching and malaise over the last several weeks, and she was evaluated in the emergency room just about 10 days ago for intermittent confusion over the last month as well as intermittent abdominal pain.  Workup in the ED included a CT scan of the abdomen and pelvis which showed a possible pancreatic mass.  She was referred emergently to gastroenterology who attempted an ERCP but was technically difficult, and subsequently referred to interventional radiology for percutaneous drain placement which was done today.  The hospitalist were asked to admit due to concomitant multiple medical problems.  Patient seen in the room after she arrived upstairs, she denies any chest pain, shortness of breath.  She is complaining of slight epigastric abdominal pain and "fullness".  She reports having some confusion downstairs after she received sedating medications, but now she seems back to baseline.  She tells me she has been having itching for the past month, intermittent abdominal pain and intermittent confusion.  Currently she denies any fever or chills.  She has no nausea or vomiting, she is hungry and asks for something to eat.  Review of Systems: As per HPI otherwise 10 point review of systems negative.   Past Medical History:  Diagnosis Date  . Anxiety    "not all the time"   . Chronic lower back  pain   . Complication of anesthesia 2002   difficulty /w intubation, with plastic surgery , because she was feeling so anxious. 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 .   Pt. reports that she  is very sensitive to med. & is hard  to wake up.  . Depression    "not all the time"   . Difficult intubation   . Dysrhythmia    told that she sometimes has extra beats. Does not see cardiologist, had a stress test many yrs. ago>10 yrs.   . Esophageal dysmotility    "getting worse recenly" (12/15/2013)  . Fibromyalgia   . GERD (gastroesophageal reflux disease)   . Hypertension   . Nephritis ~ 1955  . Osteoarthritis    knees   . Pancreatic mass   . Seasonal allergies    "in the spring"  . Shortness of breath    walking long distances   . squamous cell carcinoma scalp    squamous, on scalp, & has had several surgeries on scalp, for plastics repair of scalp   . Type II diabetes mellitus ([place])     Past Surgical History:  Procedure Laterality Date  . ABDOMINAL HYSTERECTOMY    . APPENDECTOMY    . BACK SURGERY    . CARPAL TUNNEL RELEASE Right   . CARPAL TUNNEL RELEASE Left   . COLONOSCOPY    . CYST EXCISION  1990's?   scalp  . ESOPHAGOGASTRODUODENOSCOPY N/A 09/26/2016   Procedure: ESOPHAGOGASTRODUODENOSCOPY (EGD);  Surgeon: Gatha Mayer, MD;  Location: [facility];  Service: Endoscopy;  Laterality: N/A;  . LUMBAR DISC SURGERY  ~  2012 X 2  . SKIN GRAFT  2002   scalp; "went to [facility] to repair OR earlier in the yearr"  . SQUAMOUS CELL CARCINOMA EXCISION  2002   "off scalp; cancerous area removed and stapled; Dr. Georgia Lopes"  . TONSILLECTOMY    . TOTAL KNEE ARTHROPLASTY Left 12/14/2013   Procedure: LEFT TOTAL KNEE ARTHROPLASTY;  Surgeon: Ninetta Lights, MD;  Location: [place];  Service: Orthopedics;  Laterality: Left;  . TOTAL KNEE ARTHROPLASTY Left 12/15/2013     reports that she has never smoked. She has never used smokeless tobacco. She reports that she drinks alcohol. She reports  that she does not use drugs.  Allergies  Allergen Reactions  . No Known Allergies     Family History  Problem Relation Age of Onset  . Early death Mother   . Thrombosis Father     Prior to Admission medications   Medication Sig Start Date End Date Taking? Authorizing Provider  diclofenac (VOLTAREN) 75 MG EC tablet TAKE (1) TABLET TWICE A DAY. Patient taking differently: Take 75 mg by mouth at bedtime.  07/18/16  Yes Mary-Margaret Hassell Done, FNP  DULoxetine (CYMBALTA) 60 MG capsule Take 1 capsule (60 mg total) by mouth 2 (two) times daily. Patient taking differently: Take 60 mg by mouth at bedtime.  07/18/16  Yes Mary-Margaret Hassell Done, FNP  furosemide (LASIX) 20 MG tablet Take 1 tablet (20 mg total) by mouth daily. Patient taking differently: Take 20 mg by mouth 4 (four) times a week.  07/18/16  Yes Mary-Margaret Hassell Done, FNP  gabapentin (NEURONTIN) 300 MG capsule TAKE (1) CAPSULE TWICE DAILY. Patient taking differently: Take 300 mg by mouth at bedtime.  07/18/16  Yes Mary-Margaret Hassell Done, FNP  glipiZIDE-metformin (METAGLIP) 5-500 MG tablet Take 2 tablets by mouth 2 (two) times daily before a meal. Patient taking differently: Take 2 tablets by mouth at bedtime.  07/18/16  Yes Mary-Margaret Hassell Done, FNP  HYDROcodone-acetaminophen (NORCO) 7.5-325 MG tablet Take 1 tablet by mouth every 6 (six) hours as needed for moderate pain. 08/21/16  Yes Ashok Pall, MD  Insulin Glargine (TOUJEO SOLOSTAR) 300 UNIT/ML SOPN Inject 60 Units into the skin daily. Patient taking differently: Inject 40-50 Units into the skin at bedtime.  08/15/16  Yes Mary-Margaret Hassell Done, FNP  oxyCODONE-acetaminophen (ROXICET) 5-325 MG tablet Take 1-2 tablets by mouth every 4 (four) hours as needed. 05/20/16  Yes Mary-Margaret Hassell Done, FNP  pantoprazole (PROTONIX) 40 MG tablet Take 1 tablet (40 mg total) by mouth daily before breakfast. 09/26/16  Yes Gatha Mayer, MD  amLODipine (NORVASC) 5 MG tablet Take 1 tablet (5 mg total) by mouth  daily. Patient taking differently: Take 5 mg by mouth at bedtime.  07/18/16   Mary-Margaret Hassell Done, FNP  benazepril (LOTENSIN) 40 MG tablet Take 1 tablet (40 mg total) by mouth daily before breakfast. Patient taking differently: Take 40 mg by mouth at bedtime.  07/18/16   Mary-Margaret Hassell Done, FNP  carvedilol (COREG) 25 MG tablet TAKE  (1)  TABLET TWICE A DAY WITH MEALS (BREAKFAST AND SUPPER) Patient taking differently: Take 25 mg by mouth 2 (two) times daily.  07/18/16   Mary-Margaret Hassell Done, FNP  colestipol (COLESTID) 5 g packet Take 5 g by mouth 2 (two) times daily. 09/26/16   Gatha Mayer, MD  glucose blood (ONE TOUCH ULTRA TEST) test strip Test BID and prn  E11.42 01/30/15   Mary-Margaret Hassell Done, FNP  Insulin Pen Needle (INSUPEN PEN NEEDLES) 32G X 4 MM MISC 1 each by Does not apply  route daily. 01/30/15   Mary-Margaret Hassell Done, FNP  Lancet Devices (ONE TOUCH DELICA LANCING DEV) MISC 1 each by Does not apply route 2 (two) times daily. Dx   E11.42 01/30/15   Mary-Margaret Hassell Done, FNP    Physical Exam: Vitals:   09/29/16 1641 09/29/16 1648 09/29/16 1700 09/29/16 1711  BP: (!) 192/113 (!) 218/115 (!) 201/93 (!) 157/77  Pulse: (!) 120 (!) 123 (!) 120 (!) 101  Resp: (!) 22 (!) _0 Temp:      TempSrc:      SpO2: 100% 100% 99% 100%  Weight:        Constitutional: NAD, calm, comfortable Vitals:   09/29/16 1641 09/29/16 1648 09/29/16 1700 09/29/16 1711  BP: (!) 192/113 (!) 218/115 (!) 201/93 (!) 157/77  Pulse: (!) 120 (!) 123 (!) 120 (!) 101  Resp: (!) 22 (!) _1 Temp:      TempSrc:      SpO2: 100% 100% 99% 100%  Weight:       Eyes: PERRL, lids and conjunctivae slightly icteric ENMT: Mucous membranes are moist.  Neck: normal, supple, no masses Respiratory: clear to auscultation bilaterally, no wheezing, no crackles. Normal respiratory effort. No accessory muscle use.  Cardiovascular: Regular rate and rhythm, no murmurs / rubs / gallops. No extremity edema. 2+ pedal pulses.    Abdomen: epigastric/RUQ tenderness, no masses palpated. Bowel sounds positive.  PTC drain in place Musculoskeletal: no clubbing / cyanosis. Normal muscle tone.  Skin: no rashes, lesions, ulcers. No induration Neurologic: CN 2-12 grossly intact. Strength 5/5 in all 4.  Psychiatric: Normal judgment and insight. Alert and oriented x 3. Normal mood.   Labs on Admission: I have personally reviewed following labs and imaging studies  CBC:  Recent Labs Lab 09/24/16 1427 09/29/16 1350  WBC 7.5 9.0  NEUTROABS 5.7 6.7  HGB 11.5* 12.1  HCT 33.7* 36.2  MCV 95.2 95.0  PLT 288.0 160   Basic Metabolic Panel:  Recent Labs Lab 09/24/16 1427 09/29/16 1350  NA 134* 137  K 4.3 4.0  CL 100 98*  CO2 28 30  GLUCOSE 329* 320*  BUN 20 14  CREATININE 1.39* 1.15*  CALCIUM 9.7 10.1   GFR: Estimated Creatinine Clearance: 45 mL/min (A) (by C-G formula based on SCr of 1.15 mg/dL (H)). Liver Function Tests:  Recent Labs Lab 09/24/16 1427 09/29/16 1350  AST 49* 56*  ALT 40* 43  ALKPHOS 610* 675*  BILITOT 3.9* 4.5*  PROT 6.8 7.6  ALBUMIN 3.4* 3.2*   No results for input(s): LIPASE, AMYLASE in the last 168 hours. No results for input(s): AMMONIA in the last 168 hours. Coagulation Profile:  Recent Labs Lab 09/29/16 1350  INR 0.90   Cardiac Enzymes: No results for input(s): CKTOTAL, CKMB, CKMBINDEX, TROPONINI in the last 168 hours. BNP (last 3 results) No results for input(s): PROBNP in the last 8760 hours. HbA1C: No results for input(s): HGBA1C in the last 72 hours. CBG:  Recent Labs Lab 09/26/16 1240 09/29/16 1417  GLUCAP 179* 308*   Lipid Profile: No results for input(s): CHOL, HDL, LDLCALC, TRIG, CHOLHDL, LDLDIRECT in the last 72 hours. Thyroid Function Tests: No results for input(s): TSH, T4TOTAL, FREET4, T3FREE, THYROIDAB in the last 72 hours. Anemia Panel: No results for input(s): VITAMINB12, FOLATE, FERRITIN, TIBC, IRON, RETICCTPCT in the last 72 hours. Urine  analysis:    Component Value Date/Time   COLORURINE AMBER (A) 09/20/2016 1404   APPEARANCEUR CLEAR 09/20/2016 1404   LABSPEC  1.012 09/20/2016 1404   PHURINE 5.0 09/20/2016 1404   GLUCOSEU 50 (A) 09/20/2016 1404   HGBUR NEGATIVE 09/20/2016 1404   BILIRUBINUR NEGATIVE 09/20/2016 1404   BILIRUBINUR negative 04/06/2013 1723   KETONESUR NEGATIVE 09/20/2016 1404   PROTEINUR 100 (A) 09/20/2016 1404   UROBILINOGEN 1.0 12/06/2013 1244   NITRITE NEGATIVE 09/20/2016 1404   LEUKOCYTESUR NEGATIVE 09/20/2016 1404    Radiological Exams on Admission: No results found.  Assessment/Plan Active Problems:   Diabetes ([place])   Hypertension   Hyperlipidemia with target LDL less than 100   GERD (gastroesophageal reflux disease)   Fibromyalgia   CKD (chronic kidney disease) stage 3, GFR 30-59 ml/min   Pancreatic mass   Biliary obstruction   Pancreatic mass with biliary obstruction -Status post PTC drain placement by interventional radiology due to agitation during the procedure, biopsies could not be obtained.  Gastroenterology is managing this as an outpatient -Patient will be observed on MedSurg post procedure, anticipate discharge home in the morning if stable -We will check LFTs in the morning -She was placed empirically on Zosyn by IR peri-procedure, continue -Start clear liquid diet, advance as tolerated  Type 2 diabetes mellitus, poorly controlled, with renal complications -Place patient on Lantus at a lower dose since she will be on a clear liquid diet -Moderate sliding scale  Chronic kidney disease stage III -Creatinine is at baseline  Acute encephalopathy -Patient with intermittent confusion at home in the last month, as well as encephalopathic episode during IR.  Check ammonia level -She appears back to baseline on my evaluation  Hypertension -Hydralazine as needed -Resume home medications   DVT prophylaxis: SCDs Code Status: Full code Family Communication: Discussed with  husband at bedside Disposition Plan: Admit to MedSurg Consults called: None   Admission status: Observation   At the point of initial evaluation, it is my clinical opinion that admission for OBSERVATION is reasonable and necessary because the patient's presenting complaints in the context of their chronic conditions represent sufficient risk of deterioration or significant morbidity to constitute reasonable grounds for close observation in the hospital setting, but that the patient may be medically stable for discharge from the hospital within 24 to 48 hours.   Marzetta Board, MD [facility] Pager [phone]  If 7PM-7AM, please contact night-coverage [email or web address] Password [email or web address]  09/29/2016, 5:20 PM

## 2016-09-29 NOTE — H&P (Signed)
Chief Complaint: Patient was seen in consultation today for jaundice  Referring Physician(s):  Gessner,Carl E  Supervising Physician: Markus Daft  Patient Status: [facility] - Out-pt  History of Present Illness: Patricia Austin is a 71 y.o. female with past medical history of depression, anxiety, dysrhythmia, fibromyalgia, GERD, HTN, nephitis, osteoarthritis, DM2, with recent complaint of itching.   CT Abd/Pelvis 09/20/16: 1. 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 Abdominal MRI is recommended for further evaluation. 2. Associated mild intrahepatic and extrahepatic biliary dilatation. 3. Small right lower lobe lung nodules measuring up to 5 mm, indeterminate. Metastatic disease is not excluded. 4. Cholelithiasis. 5. Hepatic steatosis. 6. Distal esophageal wall thickening, query esophagitis.  Patient underwent ERCP 09/26/16: Tortuous and spastic esophagus. Stomach normal - bile deen. Duodenal bulb with a few clean-based ulcers with slightly heaped up edges - biopsied. The duodenum itself in second portion is very distorted and there were at least 2 diverticula - large and small - one medial and one lateral - I could never find the papilla. Some bile seen. Edematous folds. Deformity. Procedure was terminated.   IR consulted for percutaneous transhepatic cholangiogram and drain placement at the request of Dr. Carlean Purl.  Case reviewed by Dr. Anselm Pancoast who approves patient for procedure.   She has been NPO.  She does not take blood thinners.  She has been in her usual state of health.   Past Medical History:  Diagnosis Date  . Anxiety    "not all the time"   . Chronic lower back pain   . Complication of anesthesia 2002   difficulty /w intubation, with plastic surgery , because she was feeling so anxious. 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 .   Pt. reports that she  is very sensitive to med. & is hard  to wake up.   . Depression    "not all the time"   . Difficult intubation   . Dysrhythmia    told that she sometimes has extra beats. Does not see cardiologist, had a stress test many yrs. ago>10 yrs.   . Esophageal dysmotility    "getting worse recenly" (12/15/2013)  . Fibromyalgia   . GERD (gastroesophageal reflux disease)   . Hypertension   . Nephritis ~ 1955  . Osteoarthritis    knees   . Pancreatic mass   . Seasonal allergies    "in the spring"  . Shortness of breath    walking long distances   . squamous cell carcinoma scalp    squamous, on scalp, & has had several surgeries on scalp, for plastics repair of scalp   . Type II diabetes mellitus ([facility])     Past Surgical History:  Procedure Laterality Date  . ABDOMINAL HYSTERECTOMY    . APPENDECTOMY    . BACK SURGERY    . CARPAL TUNNEL RELEASE Right   . CARPAL TUNNEL RELEASE Left   . COLONOSCOPY    . CYST EXCISION  1990's?   scalp  . ESOPHAGOGASTRODUODENOSCOPY N/A 09/26/2016   Procedure: ESOPHAGOGASTRODUODENOSCOPY (EGD);  Surgeon: Gatha Mayer, MD;  Location: [facility] ENDOSCOPY;  Service: Endoscopy;  Laterality: N/A;  . LUMBAR DISC SURGERY  ~ 2012 X 2  . SKIN GRAFT  2002   scalp; "went to [facility] to repair OR earlier in the yearr"  . SQUAMOUS CELL CARCINOMA EXCISION  2002   "off scalp; cancerous area removed and stapled; Dr. Georgia Lopes"  . TONSILLECTOMY    .  TOTAL KNEE ARTHROPLASTY Left 12/14/2013   Procedure: LEFT TOTAL KNEE ARTHROPLASTY;  Surgeon: Ninetta Lights, MD;  Location: [facility];  Service: Orthopedics;  Laterality: Left;  . TOTAL KNEE ARTHROPLASTY Left 12/15/2013    Allergies: No known allergies  Medications: Prior to Admission medications   Medication Sig Start Date End Date Taking? Authorizing Provider  diclofenac (VOLTAREN) 75 MG EC tablet TAKE (1) TABLET TWICE A DAY. Patient taking differently: Take 75 mg by mouth at bedtime.  07/18/16  Yes Mary-Margaret Hassell Done, FNP  DULoxetine (CYMBALTA) 60 MG capsule Take 1 capsule (60 mg  total) by mouth 2 (two) times daily. Patient taking differently: Take 60 mg by mouth at bedtime.  07/18/16  Yes Mary-Margaret Hassell Done, FNP  furosemide (LASIX) 20 MG tablet Take 1 tablet (20 mg total) by mouth daily. Patient taking differently: Take 20 mg by mouth 4 (four) times a week.  07/18/16  Yes Mary-Margaret Hassell Done, FNP  gabapentin (NEURONTIN) 300 MG capsule TAKE (1) CAPSULE TWICE DAILY. Patient taking differently: Take 300 mg by mouth at bedtime.  07/18/16  Yes Mary-Margaret Hassell Done, FNP  glipiZIDE-metformin (METAGLIP) 5-500 MG tablet Take 2 tablets by mouth 2 (two) times daily before a meal. Patient taking differently: Take 2 tablets by mouth at bedtime.  07/18/16  Yes Mary-Margaret Hassell Done, FNP  HYDROcodone-acetaminophen (NORCO) 7.5-325 MG tablet Take 1 tablet by mouth every 6 (six) hours as needed for moderate pain. 08/21/16  Yes Ashok Pall, MD  Insulin Glargine (TOUJEO SOLOSTAR) 300 UNIT/ML SOPN Inject 60 Units into the skin daily. Patient taking differently: Inject 40-50 Units into the skin at bedtime.  08/15/16  Yes Mary-Margaret Hassell Done, FNP  oxyCODONE-acetaminophen (ROXICET) 5-325 MG tablet Take 1-2 tablets by mouth every 4 (four) hours as needed. 05/20/16  Yes Mary-Margaret Hassell Done, FNP  pantoprazole (PROTONIX) 40 MG tablet Take 1 tablet (40 mg total) by mouth daily before breakfast. 09/26/16  Yes Gatha Mayer, MD  amLODipine (NORVASC) 5 MG tablet Take 1 tablet (5 mg total) by mouth daily. Patient taking differently: Take 5 mg by mouth at bedtime.  07/18/16   Mary-Margaret Hassell Done, FNP  benazepril (LOTENSIN) 40 MG tablet Take 1 tablet (40 mg total) by mouth daily before breakfast. Patient taking differently: Take 40 mg by mouth at bedtime.  07/18/16   Mary-Margaret Hassell Done, FNP  carvedilol (COREG) 25 MG tablet TAKE  (1)  TABLET TWICE A DAY WITH MEALS (BREAKFAST AND SUPPER) Patient taking differently: Take 25 mg by mouth 2 (two) times daily.  07/18/16   Mary-Margaret Hassell Done, FNP  colestipol  (COLESTID) 5 g packet Take 5 g by mouth 2 (two) times daily. 09/26/16   Gatha Mayer, MD  glucose blood (ONE TOUCH ULTRA TEST) test strip Test BID and prn  E11.42 01/30/15   Mary-Margaret Hassell Done, FNP  Insulin Pen Needle (INSUPEN PEN NEEDLES) 32G X 4 MM MISC 1 each by Does not apply route daily. 01/30/15   Mary-Margaret Hassell Done, FNP  Lancet Devices (ONE TOUCH DELICA LANCING DEV) MISC 1 each by Does not apply route 2 (two) times daily. Dx   M54.65 01/30/15   Mary-Margaret Hassell Done, FNP     Family History  Problem Relation Age of Onset  . Early death Mother   . Thrombosis Father     Social History   Social History  . Marital status: Married    Spouse name: N/A  . Number of children: N/A  . Years of education: N/A   Social History Main Topics  . Smoking status: Never Smoker  .  Smokeless tobacco: Never Used  . Alcohol use Yes     Comment: rare  . Drug use: No  . Sexual activity: No   Other Topics Concern  . None   Social History Narrative  . None    Review of Systems  Constitutional: Negative for fatigue and fever.  Respiratory: Negative for cough and shortness of breath.   Cardiovascular: Negative for chest pain.  Gastrointestinal: Positive for abdominal pain and nausea.  Skin:       itching    Vital Signs: BP (!) 177/76 (BP Location: Right Arm)   Pulse (!) 101   Temp 99.3 F (37.4 C) (Oral)   Resp 18   Wt 181 lb 12.8 oz (82.5 kg)   SpO2 98%   BMI 32.20 kg/m   Physical Exam  Constitutional: She is oriented to person, place, and time. She appears well-developed.  Eyes: Scleral icterus is present.  Cardiovascular: Normal rate and normal heart sounds.   Pulmonary/Chest: Effort normal and breath sounds normal. No respiratory distress.  Abdominal: Soft.  Neurological: She is alert and oriented to person, place, and time.  Skin: Skin is warm and dry.  jaundice  Psychiatric: She has a normal mood and affect. Her behavior is normal. Judgment and thought content normal.    Nursing note and vitals reviewed.   Mallampati Score:  MD Evaluation Airway: WNL Heart: WNL Abdomen: WNL Chest/ Lungs: WNL ASA  Classification: 3 Mallampati/Airway Score: Two  Imaging: Ct Head Wo Contrast  Result Date: 09/20/2016 CLINICAL DATA:  Weakness. Left-sided numbness and tingling for 3 days. History of diabetes and hypertension. EXAM: CT HEAD WITHOUT CONTRAST TECHNIQUE: Contiguous axial images were obtained from the base of the skull through the vertex without intravenous contrast. COMPARISON:  06/02/2013 FINDINGS: Brain: There is no evidence of acute cortical infarct, intracranial hemorrhage, mass, midline shift, or extra-axial fluid collection. Mild cerebral atrophy may have minimally progressed. Periventricular white matter hypodensities also appear slightly increased and are nonspecific but compatible with mild chronic small vessel ischemic disease. Vascular: Mild calcified atherosclerosis at the skullbase. No hyperdense vessel. Skull: No fracture or focal osseous lesion. Sinuses/Orbits: Visualized paranasal sinuses are clear. Minimal chronic air cell opacification at the mastoid tips. Unremarkable orbits. Other: None. IMPRESSION: 1. No evidence of acute intracranial abnormality. 2. Mild chronic small vessel ischemic disease. Electronically Signed   By: Logan Bores M.D.   On: 09/20/2016 13:48   Ct Abdomen Pelvis W Contrast  Result Date: 09/20/2016 CLINICAL DATA:  Transaminitis. Dysuria and lower abdominal pain. Lumbar spine surgery 1 month ago. EXAM: CT ABDOMEN AND PELVIS WITH CONTRAST TECHNIQUE: Multidetector CT imaging of the abdomen and pelvis was performed using the standard protocol following bolus administration of intravenous contrast. CONTRAST:  80 mL Isovue 300 COMPARISON:  None. FINDINGS: Lower chest: 5 mm right lower lobe lung nodule (series 4, image 12). 3 mm right lower lobe nodule (series 4, image 14). 5 mm calcified nodule in the left lower lobe. No lung  consolidation or pleural effusion. Hepatobiliary: Diffusely decreased attenuation of the liver suggesting steatosis. Scattered calcified granulomas in the liver. Mild intrahepatic biliary dilatation. Common bile duct dilatation measuring 12 mm. 3.4 cm stone in the gallbladder without evidence of acute cholecystitis. Pancreas: There is an approximately 4.0 x 3.1 x 3.9 cm heterogeneously hypoenhancing mass at the level of the uncinate process of the pancreas. This may contact the posterior margin of the adjacent superior mesenteric artery and partially encases some small adjacent SMA branch vessels. There is  not a substantial enough amount of surrounding stranding to suggest that this represents pancreatitis rather than neoplasm. There is pancreatic atrophy without ductal dilatation. Spleen: Calcified granulomas in the spleen. Adrenals/Urinary Tract: Unremarkable adrenal glands. No evidence of renal calculi or hydronephrosis. Subcentimeter low-density renal lesions bilaterally are too small to fully characterize but most likely represent cysts. Unremarkable bladder. Stomach/Bowel: Apparent mild diffuse wall thickening of the visualized distal esophagus. The stomach is within normal limits. Duodenal diverticular noted. No evidence of bowel obstruction. Mild colonic diverticulosis without evidence of diverticulitis. Prior appendectomy. Vascular/Lymphatic: Minimal atherosclerotic plaque in the abdominal aorta without aneurysm. Small portacaval lymph nodes measure up to 7 mm in short axis. Reproductive: Prior hysterectomy.  No adnexal mass. Other: No intraperitoneal free fluid. Musculoskeletal: Postoperative changes from recent L3-L5 PLIF. IMPRESSION: 1. 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 Abdominal MRI is recommended for further evaluation. 2. Associated mild intrahepatic and extrahepatic biliary dilatation. 3. Small right lower lobe lung  nodules measuring up to 5 mm, indeterminate. Metastatic disease is not excluded. 4. Cholelithiasis. 5. Hepatic steatosis. 6. Distal esophageal wall thickening, query esophagitis. Electronically Signed   By: Logan Bores M.D.   On: 09/20/2016 21:13   Dg C-arm 1-60 Min-no Report  Result Date: 09/26/2016 Fluoroscopy was utilized by the requesting physician.  No radiographic interpretation.    Labs:  CBC:  Recent Labs  08/20/16 1841 09/20/16 1314 09/24/16 1427 09/29/16 1350  WBC 12.6* 6.6 7.5 9.0  HGB 10.2* 11.4* 11.5* 12.1  HCT 31.8* 34.8* 33.7* 36.2  PLT 208 295 288.0 271    COAGS:  Recent Labs  09/20/16 1314 09/29/16 1350  INR 0.86 0.90  APTT 33  --     BMP:  Recent Labs  08/12/16 1445 08/20/16 1841 09/20/16 1314 09/24/16 1427 09/29/16 1350  NA 140  --  135 134* 137  K 4.2  --  4.2 4.3 4.0  CL 104  --  101 100 98*  CO2 27  --  25 28 30   GLUCOSE 129*  --  168* 329* 320*  BUN 20  --  22* 20 14  CALCIUM 10.0  --  9.8 9.7 10.1  CREATININE 1.04* 1.18* 1.27* 1.39* 1.15*  GFRNONAA 52* 45* 41*  --  46*  GFRAA >60 52* 48*  --  54*    LIVER FUNCTION TESTS:  Recent Labs  07/18/16 1046 09/20/16 1314 09/24/16 1427 09/29/16 1350  BILITOT 0.3 4.2* 3.9* 4.5*  AST 34 53* 49* 56*  ALT 34* 42 40* 43  ALKPHOS 154* 609* 610* 675*  PROT 6.7 6.6 6.8 7.6  ALBUMIN 4.1 2.7* 3.4* 3.2*    TUMOR MARKERS: No results for input(s): AFPTM, CEA, CA199, CHROMGRNA in the last 8760 hours.  Assessment and Plan: Patient with history of anxiety, depression, HTN, GERD, and DM2, presents with recent onset of itching and yellowing skin. CT Abd/Pelvis 09/20/16 showed pelvic mass with biliary obstruction.  ERCP was attempted 09/26/16 but was difficult due to complex anatomy.  IR consulted for percutaneous transhepatic cholangiogram and drain placement at the request of Dr. Carlean Purl.  Case reviewed by Dr. Anselm Pancoast who approves patient for procedure.  She has been NPO.  She does not take blood  thinners.  Patient is aware she will be admitted overnight.  Risks and Benefits discussed with the patient including, but not limited to bleeding, infection which may lead to sepsis or even death and damage to adjacent structures. All of  the patient's questions were answered, patient is agreeable to proceed. Consent signed and in chart.  Thank you for this interesting consult.  I greatly enjoyed meeting Patricia Austin and look forward to participating in their care.  A copy of this report was sent to the requesting provider on this date.  Electronically Signed: Docia Barrier 09/29/2016, 2:58 PM   I spent a total of  30 Minutes   in face to face in clinical consultation, greater than 50% of which was counseling/coordinating care for jaundice

## 2016-09-30 ENCOUNTER — Telehealth: Payer: Self-pay

## 2016-09-30 ENCOUNTER — Observation Stay (HOSPITAL_COMMUNITY): Payer: Medicare Other

## 2016-09-30 ENCOUNTER — Other Ambulatory Visit: Payer: Self-pay

## 2016-09-30 DIAGNOSIS — Z85828 Personal history of other malignant neoplasm of skin: Secondary | ICD-10-CM | POA: Diagnosis not present

## 2016-09-30 DIAGNOSIS — K830 Cholangitis: Secondary | ICD-10-CM | POA: Diagnosis not present

## 2016-09-30 DIAGNOSIS — E1142 Type 2 diabetes mellitus with diabetic polyneuropathy: Secondary | ICD-10-CM | POA: Diagnosis not present

## 2016-09-30 DIAGNOSIS — N179 Acute kidney failure, unspecified: Secondary | ICD-10-CM | POA: Diagnosis present

## 2016-09-30 DIAGNOSIS — G9340 Encephalopathy, unspecified: Secondary | ICD-10-CM

## 2016-09-30 DIAGNOSIS — F329 Major depressive disorder, single episode, unspecified: Secondary | ICD-10-CM | POA: Diagnosis present

## 2016-09-30 DIAGNOSIS — I10 Essential (primary) hypertension: Secondary | ICD-10-CM | POA: Diagnosis not present

## 2016-09-30 DIAGNOSIS — E1122 Type 2 diabetes mellitus with diabetic chronic kidney disease: Secondary | ICD-10-CM | POA: Diagnosis present

## 2016-09-30 DIAGNOSIS — K8689 Other specified diseases of pancreas: Secondary | ICD-10-CM

## 2016-09-30 DIAGNOSIS — K869 Disease of pancreas, unspecified: Secondary | ICD-10-CM | POA: Diagnosis present

## 2016-09-30 DIAGNOSIS — K861 Other chronic pancreatitis: Secondary | ICD-10-CM | POA: Diagnosis not present

## 2016-09-30 DIAGNOSIS — E785 Hyperlipidemia, unspecified: Secondary | ICD-10-CM | POA: Diagnosis present

## 2016-09-30 DIAGNOSIS — E1165 Type 2 diabetes mellitus with hyperglycemia: Secondary | ICD-10-CM | POA: Diagnosis present

## 2016-09-30 DIAGNOSIS — Z90710 Acquired absence of both cervix and uterus: Secondary | ICD-10-CM | POA: Diagnosis not present

## 2016-09-30 DIAGNOSIS — F419 Anxiety disorder, unspecified: Secondary | ICD-10-CM | POA: Diagnosis present

## 2016-09-30 DIAGNOSIS — K760 Fatty (change of) liver, not elsewhere classified: Secondary | ICD-10-CM | POA: Diagnosis present

## 2016-09-30 DIAGNOSIS — G9341 Metabolic encephalopathy: Secondary | ICD-10-CM | POA: Diagnosis present

## 2016-09-30 DIAGNOSIS — K831 Obstruction of bile duct: Secondary | ICD-10-CM | POA: Diagnosis not present

## 2016-09-30 DIAGNOSIS — Z9049 Acquired absence of other specified parts of digestive tract: Secondary | ICD-10-CM | POA: Diagnosis not present

## 2016-09-30 DIAGNOSIS — R17 Unspecified jaundice: Secondary | ICD-10-CM | POA: Diagnosis present

## 2016-09-30 DIAGNOSIS — J811 Chronic pulmonary edema: Secondary | ICD-10-CM | POA: Diagnosis not present

## 2016-09-30 DIAGNOSIS — N183 Chronic kidney disease, stage 3 (moderate): Secondary | ICD-10-CM | POA: Diagnosis not present

## 2016-09-30 DIAGNOSIS — M545 Low back pain: Secondary | ICD-10-CM | POA: Diagnosis present

## 2016-09-30 DIAGNOSIS — R410 Disorientation, unspecified: Secondary | ICD-10-CM | POA: Diagnosis not present

## 2016-09-30 DIAGNOSIS — K224 Dyskinesia of esophagus: Secondary | ICD-10-CM | POA: Diagnosis present

## 2016-09-30 DIAGNOSIS — R918 Other nonspecific abnormal finding of lung field: Secondary | ICD-10-CM | POA: Diagnosis present

## 2016-09-30 DIAGNOSIS — M797 Fibromyalgia: Secondary | ICD-10-CM | POA: Diagnosis present

## 2016-09-30 DIAGNOSIS — I129 Hypertensive chronic kidney disease with stage 1 through stage 4 chronic kidney disease, or unspecified chronic kidney disease: Secondary | ICD-10-CM | POA: Diagnosis present

## 2016-09-30 DIAGNOSIS — R509 Fever, unspecified: Secondary | ICD-10-CM | POA: Diagnosis not present

## 2016-09-30 DIAGNOSIS — A419 Sepsis, unspecified organism: Secondary | ICD-10-CM | POA: Diagnosis not present

## 2016-09-30 DIAGNOSIS — K219 Gastro-esophageal reflux disease without esophagitis: Secondary | ICD-10-CM | POA: Diagnosis not present

## 2016-09-30 DIAGNOSIS — K8021 Calculus of gallbladder without cholecystitis with obstruction: Secondary | ICD-10-CM | POA: Diagnosis present

## 2016-09-30 DIAGNOSIS — G8929 Other chronic pain: Secondary | ICD-10-CM | POA: Diagnosis present

## 2016-09-30 DIAGNOSIS — L299 Pruritus, unspecified: Secondary | ICD-10-CM | POA: Diagnosis present

## 2016-09-30 LAB — CBC WITH DIFFERENTIAL/PLATELET
Basophils Absolute: 0 10*3/uL (ref 0.0–0.1)
Basophils Relative: 0 %
Eosinophils Absolute: 0 10*3/uL (ref 0.0–0.7)
Eosinophils Relative: 0 %
HEMATOCRIT: 31.4 % — AB (ref 36.0–46.0)
Hemoglobin: 10.5 g/dL — ABNORMAL LOW (ref 12.0–15.0)
LYMPHS ABS: 1.4 10*3/uL (ref 0.7–4.0)
Lymphocytes Relative: 6 %
MCH: 31.8 pg (ref 26.0–34.0)
MCHC: 33.4 g/dL (ref 30.0–36.0)
MCV: 95.2 fL (ref 78.0–100.0)
MONOS PCT: 4 %
Monocytes Absolute: 1 10*3/uL (ref 0.1–1.0)
NEUTROS ABS: 19.9 10*3/uL — AB (ref 1.7–7.7)
NEUTROS PCT: 90 %
Platelets: 251 10*3/uL (ref 150–400)
RBC: 3.3 MIL/uL — ABNORMAL LOW (ref 3.87–5.11)
RDW: 14.2 % (ref 11.5–15.5)
WBC: 22.3 10*3/uL — ABNORMAL HIGH (ref 4.0–10.5)

## 2016-09-30 LAB — COMPREHENSIVE METABOLIC PANEL
ALK PHOS: 485 U/L — AB (ref 38–126)
ALT: 40 U/L (ref 14–54)
ANION GAP: 6 (ref 5–15)
AST: 55 U/L — ABNORMAL HIGH (ref 15–41)
Albumin: 2.4 g/dL — ABNORMAL LOW (ref 3.5–5.0)
BILIRUBIN TOTAL: 3.5 mg/dL — AB (ref 0.3–1.2)
BUN: 19 mg/dL (ref 6–20)
CALCIUM: 9.2 mg/dL (ref 8.9–10.3)
CO2: 28 mmol/L (ref 22–32)
Chloride: 104 mmol/L (ref 101–111)
Creatinine, Ser: 1.41 mg/dL — ABNORMAL HIGH (ref 0.44–1.00)
GFR calc non Af Amer: 36 mL/min — ABNORMAL LOW (ref >60)
GFR, EST AFRICAN AMERICAN: 42 mL/min — AB (ref >60)
GLUCOSE: 134 mg/dL — AB (ref 65–99)
Potassium: 3.9 mmol/L (ref 3.5–5.1)
Sodium: 138 mmol/L (ref 135–145)
TOTAL PROTEIN: 6.1 g/dL — AB (ref 6.5–8.1)

## 2016-09-30 LAB — URINALYSIS, ROUTINE W REFLEX MICROSCOPIC
Glucose, UA: NEGATIVE mg/dL
Hgb urine dipstick: NEGATIVE
Ketones, ur: NEGATIVE mg/dL
Leukocytes, UA: NEGATIVE
Nitrite: NEGATIVE
Protein, ur: 30 mg/dL — AB
Specific Gravity, Urine: 1.02 (ref 1.005–1.030)
pH: 5 (ref 5.0–8.0)

## 2016-09-30 LAB — GLUCOSE, CAPILLARY
GLUCOSE-CAPILLARY: 136 mg/dL — AB (ref 65–99)
GLUCOSE-CAPILLARY: 107 mg/dL — AB (ref 65–99)
Glucose-Capillary: 280 mg/dL — ABNORMAL HIGH (ref 65–99)

## 2016-09-30 LAB — URINALYSIS, MICROSCOPIC (REFLEX): RBC / HPF: NONE SEEN RBC/hpf (ref 0–5)

## 2016-09-30 LAB — LACTIC ACID, PLASMA: Lactic Acid, Venous: 0.8 mmol/L (ref 0.5–1.9)

## 2016-09-30 LAB — CANCER ANTIGEN 19-9: CA 19-9: 9493 U/mL — ABNORMAL HIGH (ref 0–35)

## 2016-09-30 MED ORDER — HYDRALAZINE HCL 20 MG/ML IJ SOLN: 5.0000 mg | INTRAMUSCULAR | Status: DC | PRN

## 2016-09-30 MED ORDER — SODIUM CHLORIDE 0.9 % IV BOLUS (SEPSIS)
500.0000 mL | Freq: Once | INTRAVENOUS | Status: AC
  Administered 2016-09-30: 500 mL via INTRAVENOUS

## 2016-09-30 MED ORDER — INSULIN GLARGINE 100 UNIT/ML ~~LOC~~ SOLN
17.0000 [IU] | Freq: Every day | SUBCUTANEOUS | Status: DC
  Administered 2016-09-30 – 2016-10-01 (×2): 17 [IU] via SUBCUTANEOUS
  Filled 2016-09-30 (×3): qty 0.17

## 2016-09-30 MED ORDER — LORAZEPAM 1 MG PO TABS
1.0000 mg | ORAL_TABLET | Freq: Once | ORAL | Status: AC
  Administered 2016-09-30: 1 mg via ORAL
  Filled 2016-09-30: qty 1

## 2016-09-30 MED ORDER — PIPERACILLIN-TAZOBACTAM 3.375 G IVPB
3.3750 g | Freq: Three times a day (TID) | INTRAVENOUS | Status: DC
  Administered 2016-09-30 – 2016-10-02 (×7): 3.375 g via INTRAVENOUS
  Filled 2016-09-30 (×8): qty 50

## 2016-09-30 MED ORDER — SODIUM CHLORIDE 0.9 % IV SOLN
INTRAVENOUS | Status: DC
  Administered 2016-09-30: 10:00:00 via INTRAVENOUS
  Administered 2016-10-01: 1000 mL via INTRAVENOUS

## 2016-09-30 NOTE — Progress Notes (Signed)
Nursing Note: Called by NT,Geraldine that T-103 P-101 R-24 BP 149/103 PO2 95% on r/a.Went to the bedside to assess the pt and noted that pt somewhat lethargic,unable to follow commands.Pt called multiple times but never answered,just looked at this nurse.Finally asked pt if she was in pain and pt nodded ,no.After repeated requests to say words,pt said "No"the patient asked to lift her arms and lifted them a little.Asked pt to lift her legs,wiggle her toes and pt gave a blank stare and was never able to complete command.Checked pupils and pupils are a little larger and more sluggish than shift change.Pt was talking and responding and much more alert earlier.A; Paged on-call and paged rapid response nurse.[place] at the bedside and assessed pt and ordered NS bolus,tylenol supp and blood cultures x2,and CT of Head.cgb was 156.2300. Rapid response Nurse Malachy Mood accompanied pt to radiology.2340 Pt back from radiology.Pt has been incont and has been changed.2350 T-101.1 P-109 R-28 BP 136/65 PO2 98%.Pt able to drink water ,then drank apple juice and proceeded to gag, and wretch and just spit it out forcefully on spread.A; Paged on-call and made aware of vitals and event .Orders received,pt NPO,Ns bolus again x1 500 cc and pt to receive 325 mg of tylenol.All completed.Pt began to try to egt up and at this point is speaking and says ,"I need to get up".This RN stayed at the bedside for at least 5 minutes til pt settled and was no longer trying to get OOB.wbb Results for orders placed or performed during the hospital encounter of 09/29/16  CBC with Differential/Platelet  Result Value Ref Range   WBC 9.0 4.0 - 10.5 K/uL   RBC 3.81 (L) 3.87 - 5.11 MIL/uL   Hemoglobin 12.1 12.0 - 15.0 g/dL   HCT 36.2 36.0 - 46.0 %   MCV 95.0 78.0 - 100.0 fL   MCH 31.8 26.0 - 34.0 pg   MCHC 33.4 30.0 - 36.0 g/dL   RDW 13.7 11.5 - 15.5 %   Platelets 271 150 - 400 K/uL   Neutrophils Relative % 75 %   Neutro Abs 6.7  1.7 - 7.7 K/uL   Lymphocytes Relative 16 %   Lymphs Abs 1.4 0.7 - 4.0 K/uL   Monocytes Relative 8 %   Monocytes Absolute 0.7 0.1 - 1.0 K/uL   Eosinophils Relative 1 %   Eosinophils Absolute 0.1 0.0 - 0.7 K/uL   Basophils Relative 0 %   Basophils Absolute 0.0 0.0 - 0.1 K/uL  Comprehensive metabolic panel  Result Value Ref Range   Sodium 137 135 - 145 mmol/L   Potassium 4.0 3.5 - 5.1 mmol/L   Chloride 98 (L) 101 - 111 mmol/L   CO2 30 22 - 32 mmol/L   Glucose, Bld 320 (H) 65 - 99 mg/dL   BUN 14 6 - 20 mg/dL   Creatinine, Ser 1.15 (H) 0.44 - 1.00 mg/dL   Calcium 10.1 8.9 - 10.3 mg/dL   Total Protein 7.6 6.5 - 8.1 g/dL   Albumin 3.2 (L) 3.5 - 5.0 g/dL   AST 56 (H) 15 - 41 U/L   ALT 43 14 - 54 U/L   Alkaline Phosphatase 675 (H) 38 - 126 U/L   Total Bilirubin 4.5 (H) 0.3 - 1.2 mg/dL   GFR calc non Af Amer 46 (L) >60 mL/min   GFR calc Af Amer 54 (L) >60 mL/min   Anion gap 9 5 - 15  Protime-INR  Result Value Ref Range   Prothrombin Time 12.2  11.4 - 15.2 seconds   INR 0.90   Ammonia  Result Value Ref Range   Ammonia 26 9 - 35 umol/L  Cancer antigen 19-9  Result Value Ref Range   CA 19-9 9,493 (H) 0 - 35 U/mL  CBC with Differential/Platelet  Result Value Ref Range   WBC 22.3 (H) 4.0 - 10.5 K/uL   RBC 3.30 (L) 3.87 - 5.11 MIL/uL   Hemoglobin 10.5 (L) 12.0 - 15.0 g/dL   HCT 31.4 (L) 36.0 - 46.0 %   MCV 95.2 78.0 - 100.0 fL   MCH 31.8 26.0 - 34.0 pg   MCHC 33.4 30.0 - 36.0 g/dL   RDW 14.2 11.5 - 15.5 %   Platelets 251 150 - 400 K/uL   Neutrophils Relative % 90 %   Neutro Abs 19.9 (H) 1.7 - 7.7 K/uL   Lymphocytes Relative 6 %   Lymphs Abs 1.4 0.7 - 4.0 K/uL   Monocytes Relative 4 %   Monocytes Absolute 1.0 0.1 - 1.0 K/uL   Eosinophils Relative 0 %   Eosinophils Absolute 0.0 0.0 - 0.7 K/uL   Basophils Relative 0 %   Basophils Absolute 0.0 0.0 - 0.1 K/uL  Comprehensive metabolic panel  Result Value Ref Range   Sodium 138 135 - 145 mmol/L   Potassium 3.9 3.5 - 5.1 mmol/L    Chloride 104 101 - 111 mmol/L   CO2 28 22 - 32 mmol/L   Glucose, Bld 134 (H) 65 - 99 mg/dL   BUN 19 6 - 20 mg/dL   Creatinine, Ser 1.41 (H) 0.44 - 1.00 mg/dL   Calcium 9.2 8.9 - 10.3 mg/dL   Total Protein 6.1 (L) 6.5 - 8.1 g/dL   Albumin 2.4 (L) 3.5 - 5.0 g/dL   AST 55 (H) 15 - 41 U/L   ALT 40 14 - 54 U/L   Alkaline Phosphatase 485 (H) 38 - 126 U/L   Total Bilirubin 3.5 (H) 0.3 - 1.2 mg/dL   GFR calc non Af Amer 36 (L) >60 mL/min   GFR calc Af Amer 42 (L) >60 mL/min   Anion gap 6 5 - 15  Glucose, capillary  Result Value Ref Range   Glucose-Capillary 183 (H) 65 - 99 mg/dL   Comment 1 Notify RN    Comment 2 Document in Chart   Glucose, capillary  Result Value Ref Range   Glucose-Capillary 156 (H) 65 - 99 mg/dL   Comment 1 Notify RN   Glucose, capillary  Result Value Ref Range   Glucose-Capillary 121 (H) 65 - 99 mg/dL   Comment 1 Notify RN   Lactic acid, plasma  Result Value Ref Range   Lactic Acid, Venous 0.8 0.5 - 1.9 mmol/L

## 2016-09-30 NOTE — Progress Notes (Signed)
   Appreciate IR assistance.  I have asked my partner to see patient if possible while she is here - Dr. Ardis Hughs and our team will f/u with her and help with diagnostic evaluation - possible EUS at some point.  Gatha Mayer, MD, [facility] [phone] (pager) [phone] after 5 PM, weekends and holidays  09/30/2016 8:14 AM

## 2016-09-30 NOTE — Progress Notes (Signed)
Pharmacy Antibiotic Note  Patricia Austin is a 71 y.o. female admitted on 09/29/2016 with medical history significant of hypertension, type 2 diabetes mellitus, poorly controlled, obesity, hyperlipidemia, fibromyalgia, chronic kidney disease stage III, who is admitted to the hospital from IR following PTC drain placement Pharmacy has been consulted for zosyn dosing for intra-abdominal infection  Plan: Zosyn 3.375g IV Q8H infused over 4hrs. Follow renal function and clinical course   Height: _0  (160 cm) Weight: 188 lb (85.3 kg) IBW/kg (Calculated) : 52.4  Temp (24hrs), Avg:99.3 F (37.4 C), Min:97.6 F (36.4 C), Max:103.1 F (39.5 C)   Recent Labs Lab 09/24/16 1427 09/29/16 1350 09/30/16 0348 09/30/16 0653  WBC 7.5 9.0 22.3*  --   CREATININE 1.39* 1.15* 1.41*  --   LATICACIDVEN  --   --   --  0.8    Estimated Creatinine Clearance: 37.3 mL/min (A) (by C-G formula based on SCr of 1.41 mg/dL (H)).    Allergies  Allergen Reactions  . No Known Allergies     Antimicrobials this admission: 4/9 zosyn >>   Microbiology results: 4/9 BCx: sent   Thank you for allowing pharmacy to be a part of this patient's care.  Dolly Rias RPh 09/30/2016, 8:51 AM Pager [phone]

## 2016-09-30 NOTE — Evaluation (Signed)
Clinical/Bedside Swallow Evaluation Patient Details  Name: Patricia Austin MRN: [id number] Date of Birth: 12-Oct-1945  Today's Date: 09/30/2016 Time: SLP Start Time (ACUTE ONLY): 1448 SLP Stop Time (ACUTE ONLY): 1640 SLP Time Calculation (min) (ACUTE ONLY): 20 min  Past Medical History:  Past Medical History:  Diagnosis Date  . Anxiety    "not all the time"   . Chronic lower back pain   . Complication of anesthesia 2002   difficulty /w intubation, with plastic surgery , because she was feeling so anxious. 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 .   Pt. reports that she  is very sensitive to med. & is hard  to wake up.  . Depression    "not all the time"   . Difficult intubation   . Dysrhythmia    told that she sometimes has extra beats. Does not see cardiologist, had a stress test many yrs. ago>10 yrs.   . Esophageal dysmotility    "getting worse recenly" (12/15/2013)  . Fibromyalgia   . GERD (gastroesophageal reflux disease)   . Hypertension   . Nephritis ~ 1955  . Osteoarthritis    knees   . Pancreatic mass   . Seasonal allergies    "in the spring"  . Shortness of breath    walking long distances   . squamous cell carcinoma scalp    squamous, on scalp, & has had several surgeries on scalp, for plastics repair of scalp   . Type II diabetes mellitus ([place])    Past Surgical History:  Past Surgical History:  Procedure Laterality Date  . ABDOMINAL HYSTERECTOMY    . APPENDECTOMY    . BACK SURGERY    . CARPAL TUNNEL RELEASE Right   . CARPAL TUNNEL RELEASE Left   . COLONOSCOPY    . CYST EXCISION  1990's?   scalp  . ESOPHAGOGASTRODUODENOSCOPY N/A 09/26/2016   Procedure: ESOPHAGOGASTRODUODENOSCOPY (EGD);  Surgeon: Gatha Mayer, MD;  Location: [facility] ENDOSCOPY;  Service: Endoscopy;  Laterality: N/A;  . IR INT EXT BILIARY DRAIN WITH CHOLANGIOGRAM  09/29/2016  . LUMBAR DISC SURGERY  ~ 2012 X 2  . SKIN GRAFT  2002   scalp; "went to [facility] to repair OR earlier in the yearr"  .  SQUAMOUS CELL CARCINOMA EXCISION  2002   "off scalp; cancerous area removed and stapled; Dr. Georgia Lopes"  . TONSILLECTOMY    . TOTAL KNEE ARTHROPLASTY Left 12/14/2013   Procedure: LEFT TOTAL KNEE ARTHROPLASTY;  Surgeon: Ninetta Lights, MD;  Location: [place];  Service: Orthopedics;  Laterality: Left;  . TOTAL KNEE ARTHROPLASTY Left 12/15/2013   HPI:  71 y.o. female with medical history significant of hypertension, type 2 diabetes mellitus, poorly controlled, obesity, hyperlipidemia, fibromyalgia, chronic kidney disease stage III, who was admitted 4/9 to the hospital from interventional radiology following PTC drain placement.  She was recently diagnosed with a pancreatic mass and biliary obstruction.  ERCP was unsuccessful as an outpatient and she was referred for interventional radiology.  Swallow evaluation ordered due to pt having mental status changes and coughing on liquids after procedure.  Spouse present and reports pt does cough sometimes with po at home. Pt admits to chronic episodic problems swallowing causing her to have difficulty getting food/drink down - pointing to esophagus.  She states this has been ongoing for years and she denies reflux.    Assessment / Plan / Recommendation Clinical Impression  Pt with functional oropharyngeal swallow based on clinical swallow evaluation. No indication of airway compromise,  significant dysphagia nor focal CN deficits noted during intake.  She has much less confusion today than last pm.  Pt admits to premorbid occasional dysphagia - pointing to esophagus.  She states liquids faciliate clearance and she did not present with symptoms during evaluation.  Recommend regular/thin diet - no SLP follow up indicated.   SLP Visit Diagnosis: Dysphagia, unspecified (R13.10)    Aspiration Risk  No limitations    Diet Recommendation Regular;Thin liquid   Liquid Administration via: Cup;Straw Medication Administration: Whole meds with liquid Supervision: Patient  able to self feed Postural Changes: Seated upright at 90 degrees;Remain upright for at least 30 minutes after po intake    Other  Recommendations Oral Care Recommendations: Oral care BID   Follow up Recommendations None      Frequency and Duration     n/a       Prognosis   n/a     Swallow Study   General Date of Onset: 09/30/16 HPI: 71 y.o. female with medical history significant of hypertension, type 2 diabetes mellitus, poorly controlled, obesity, hyperlipidemia, fibromyalgia, chronic kidney disease stage III, who was admitted 4/9 to the hospital from interventional radiology following PTC drain placement.  She was recently diagnosed with a pancreatic mass and biliary obstruction.  ERCP was unsuccessful as an outpatient and she was referred for interventional radiology.  Swallow evaluation ordered due to pt having mental status changes and coughing on liquids after procedure.  Spouse present and reports pt does cough sometimes with po. Pt admits to chronic episodic problems swallowing causing her to have difficulty getting food/drink down - pointing to esophagus.  She states this has been ongoing for years.  Type of Study: Bedside Swallow Evaluation Diet Prior to this Study: Thin liquids (clears) Temperature Spikes Noted: No Respiratory Status: Room air History of Recent Intubation: No Behavior/Cognition: Alert;Cooperative;Pleasant mood Oral Cavity Assessment: Within Functional Limits Oral Care Completed by SLP: No Oral Cavity - Dentition: Adequate natural dentition Vision: Functional for self-feeding Self-Feeding Abilities: Able to feed self Patient Positioning: Upright in bed Baseline Vocal Quality: Normal Volitional Cough: Other (Comment) (pt with pain from drain placement) Volitional Swallow: Able to elicit    Oral/Motor/Sensory Function Overall Oral Motor/Sensory Function: Within functional limits   Ice Chips Ice chips: Not tested   Thin Liquid Thin Liquid: Within  functional limits Presentation: Self Fed;Cup;Straw Other Comments: 3 ounce water test administered, pt unable to drink sequentially stating she "ran out of breath" but no indications of airway compromise    Nectar Thick   n/a  Honey Thick   n/a  Puree Puree: Within functional limits Presentation: Self Fed;Spoon   Solid   GO   Solid: Within functional limits Presentation: Self Fed    Functional Assessment Tool Used: clinical judgement, NOMS Functional Limitations: Swallowing Swallow Current Status (S9373): At least 1 percent but less than 20 percent impaired, limited or restricted Swallow Goal Status [phone]): At least 1 percent but less than 20 percent impaired, limited or restricted Swallow Discharge Status [phone]): At least 1 percent but less than 20 percent impaired, limited or restricted   Luanna Salk, [facility] SLP [phone]

## 2016-09-30 NOTE — Progress Notes (Signed)
BSE done, full report to follow.  No indication of oropharyngeal dysphagia.  Recommend regular/thin diet.  Luanna Salk, [place] [facility] SLP [phone]

## 2016-09-30 NOTE — Progress Notes (Addendum)
Inpatient Diabetes Program Recommendations  AACE/ADA: New Consensus Statement on Inpatient Glycemic Control (2015)  Target Ranges:  Prepandial:   less than 140 mg/dL      Peak postprandial:   less than 180 mg/dL (1-2 hours)      Critically ill patients:  140 - 180 mg/dL   Results for DYANN, [place]SPE[place] (MRN [id number]) as of 09/30/2016 09:45  Ref. Range 09/29/2016 14:17 09/29/2016 18:25 09/29/2016 21:56 09/29/2016 23:55  Glucose-Capillary Latest Ref Range: 65 - 99 mg/dL 308 (H) 183 (H) 156 (H) 121 (H)    Admit with: Pancreatic mass and biliary obstruction  History: DM, CKD  Home DM Meds: Toujeo 40-50 units QHS       Metaglip 5/500- 2 tablets QHS  Current Insulin Orders: Lantus 17 units QHS      Novolog Moderate Correction Scale/ SSI (0-15 units) TID AC + HS     MD- Note Patient given 20 units Lantus yesterday at 3pm.    Lantus dose reduced to 17 units QHS for tonight.  Patient takes fairly large dose of Lantus at home: 40-50 units QHS.  If CBG tomorrow AM is significantly elevated, please consider increasing Lantus dose.     --Will follow patient during hospitalization--  Wyn Quaker RN, MSN, CDE Diabetes Coordinator Inpatient Glycemic Control Team Team Pager: [phone] (8a-5p)

## 2016-09-30 NOTE — Progress Notes (Signed)
Shift event note:  Notified by RN regarding change in pt's mentation. RN reports that at beginning of shift (and soon after returning from IR) pt alert and oriented. Pt now appears confused and does not respond verbally except occasionally yes or no. Pt will not follow commands consistently so difficult to assess neurological status. Also noted w/ temp of 103, remains tachycardic w/ HR in the 115 range, remaining VSS . At bedside pt noted awake and in no acute distress. She will respond to name at times by looking at you and will occasionally respond with a yes or no. She does not answer questions when prompted. She does follow simple commands at times but not consistently. After much prompting it is noted that pt MAE x 4, grips are weak bilaterally but equal. PEARRL (59mm) and there is no appreciable facial droop. Abd dressing CDI. There is no TTP and pt answers no when ask about pain. Per RN pt rec'd Fentanyl at approx 1600 in procedure but no sedating meds since that time. It is also reported that pt became combative and confused during procedure as well and required restraints to complete procedure, however that appeared to have resolved by the time she returned to floor. Husband at bedside reports pt had a similar episode of confusion on  09/20/2016 and was seen at [facility] where a CT head was done and was without acute findings. Assessment/Plan: 1. Altered mental status: Although significant change in mentation since beginning of shift this does not appear to be an isolated event. Non-focal exam. Doubt acute neurological event but will obtain ct head w/o cm to r/o. Question medication effects, fever, sundowning vs underlying dementia given h/o these episodes recently at home. Follow ct.  2. Fever: Etiology unclear at this time as pt had been afebrile prior to procedure. Blood cx's x 2 obtained. Will add CXR, lactate,  u/a and urine cx. Will hold on antibiotics for now. Pt given Tylenol suppository and 500 cc IVFB.  Discussed plan with husband who is agreeable. Will continue to monitor closely on med-surg with low threshold to transfer to higher level of care if pt has further deterioration.   Jeryl Columbia, NP-C [facility] Pager [phone]

## 2016-09-30 NOTE — Significant Event (Signed)
Rapid Response Event Note  Overview: Time Called: 2210 Arrival Time: 2220 Event Type: Neurologic, Other (Comment)  Initial Focused Assessment: After returning from IR procedure patient became more confused and restless. She does not answer questions consistently. Patricia Austin, very weak. Able to say her husbands name and answers yes and no questions. During the IR procedure she became very agitated and restless. Patient developed fever 103.  Interventions:Monitored V/S, NS bolus, Tylenol, Attempted CT head, blood cultures,   Plan of Care (if not transferred): Monitor temp. And orientation  Event Summary: Name of Physician Notified: Kathrine Schorr  at 2230    at    Outcome: Stayed in room and stabalized     Patricia Austin

## 2016-09-30 NOTE — Progress Notes (Addendum)
PROGRESS NOTE  Patricia Austin XFG:[id number] DOB: 02/24/1946 DOA: 09/29/2016 PCP: Chevis Pretty, FNP   LOS: 0 days   Brief Narrative: 71 y.o. female with medical history significant of hypertension, type 2 diabetes mellitus, poorly controlled, obesity, hyperlipidemia, fibromyalgia, chronic kidney disease stage III, who was admitted 4/9 to the hospital from interventional radiology following PTC drain placement.  She was recently diagnosed with a pancreatic mass and biliary obstruction.  ERCP was unsuccessful as an outpatient and she was referred for interventional radiology.  Assessment & Plan: Active Problems:   Diabetes ([place])   Hypertension   Hyperlipidemia with target LDL less than 100   GERD (gastroesophageal reflux disease)   Fibromyalgia   CKD (chronic kidney disease) stage 3, GFR 30-59 ml/min   Pancreatic mass   Biliary obstruction   Pancreatic mass with biliary obstruction -Status post PTC drain placement by interventional radiology on 4/9.  Post procedure she was admitted to the hospital for close observation.  She was agitated during the procedure last night and biopsies could not be obtained -Bilirubin is decreasing this morning, good output from the drain -Gastroenterology are evaluating patient for EUS/biopsy, scheduled an outpatient next week  Concern for sepsis/probable cholangitis -Overnight following the procedure, patient became febrile as high as 103, tachycardic, tachypneic and confused.  Her white count has increased to 22,000 this morning from normal yesterday.  She received a dose of Zosyn for the procedure, will resume Zosyn today for presumed cholangitis.  Blood cultures and cultures from the drain were sent. -hold Norvasc and ACEI  Type 2 diabetes mellitus, poorly controlled, with renal complications -Place patient on Lantus at a lower dose while hospitalized -Moderate sliding scale -CBGs improved this morning, 134  Chronic kidney disease stage  III -Baseline creatinine 1.0-1.3, close to baseline at 1.4 this morning, give fluids due to sepsis -Hold ACEI  Acute encephalopathy -Patient with intermittent confusion at home in the last month, as well as encephalopathic episode during IR.  -Confused overnight as well in the setting of high fever.  She is better this morning, however not fully alert to date -Concern overnight about ability to swallow, she was made n.p.o. Will consult speech  Hypertension -Hydralazine as needed -hold Norvasc/ACEI as above, continue Coreg   DVT prophylaxis: SCDs Code Status: Full code Family Communication: Discussed with husband at bedside Disposition Plan: Home when ready  Consultants:   IR  GI  Procedures:   PTC drain placement 4/9  Antimicrobials:  Zosyn 4/9 >>   Subjective: -No complaints this morning, she has mild abdominal soreness but otherwise denies any chest pain, denies any shortness of breath.  Objective: Vitals:   09/29/16 2350 09/30/16 0333 09/30/16 0650 09/30/16 0800  BP: 136/65 (!) 123/54 (!) 113/50 (!) 117/51  Pulse: (!) 109 92 73 70  Resp: (!) 28 (!) '24 20 20  '$ Temp: (!) 101.1 F (38.4 C) 98.1 F (36.7 C) 97.8 F (36.6 C) 97.8 F (36.6 C)  TempSrc: Oral Oral Oral Oral  SpO2: 98% 95% 92% 97%  Weight:      Height:        Intake/Output Summary (Last 24 hours) at 09/30/16 1029 Last data filed at 09/30/16 0700  Gross per 24 hour  Intake             1000 ml  Output              780 ml  Net  220 ml   Filed Weights   09/29/16 1332 09/29/16 1745  Weight: 82.5 kg (181 lb 12.8 oz) 85.3 kg (188 lb)    Examination: Constitutional: NAD Vitals:   09/29/16 2350 09/30/16 0333 09/30/16 0650 09/30/16 0800  BP: 136/65 (!) 123/54 (!) 113/50 (!) 117/51  Pulse: (!) 109 92 73 70  Resp: (!) 28 (!) '24 20 20  '$ Temp: (!) 101.1 F (38.4 C) 98.1 F (36.7 C) 97.8 F (36.6 C) 97.8 F (36.6 C)  TempSrc: Oral Oral Oral Oral  SpO2: 98% 95% 92% 97%  Weight:       Height:       Eyes: PERRL, lids and conjunctivae slightly icteric ENMT: Mucous membranes are moist. Respiratory: clear to auscultation bilaterally, no wheezing, no crackles. Normal respiratory effort. No accessory muscle use.  Cardiovascular: Regular rate and rhythm, no murmurs / rubs / gallops. No LE edema. 2+ pedal pulses.  Abdomen: + tenderness around the PTC drain. Bowel sounds positive.  Musculoskeletal: no clubbing / cyanosis.  Neurologic: CN 2-12 grossly intact. Strength 5/5 in all 4. No asterixis  Data Reviewed: I have personally reviewed following labs and imaging studies  CBC:  Recent Labs Lab 09/24/16 1427 09/29/16 1350 09/30/16 0348  WBC 7.5 9.0 22.3*  NEUTROABS 5.7 6.7 19.9*  HGB 11.5* 12.1 10.5*  HCT 33.7* 36.2 31.4*  MCV 95.2 95.0 95.2  PLT 288.0 271 431   Basic Metabolic Panel:  Recent Labs Lab 09/24/16 1427 09/29/16 1350 09/30/16 0348  NA 134* 137 138  K 4.3 4.0 3.9  CL 100 98* 104  CO2 '28 30 28  '$ GLUCOSE 329* 320* 134*  BUN '20 14 19  '$ CREATININE 1.39* 1.15* 1.41*  CALCIUM 9.7 10.1 9.2   GFR: Estimated Creatinine Clearance: 37.3 mL/min (A) (by C-G formula based on SCr of 1.41 mg/dL (H)). Liver Function Tests:  Recent Labs Lab 09/24/16 1427 09/29/16 1350 09/30/16 0348  AST 49* 56* 55*  ALT 40* 43 40  ALKPHOS 610* 675* 485*  BILITOT 3.9* 4.5* 3.5*  PROT 6.8 7.6 6.1*  ALBUMIN 3.4* 3.2* 2.4*   No results for input(s): LIPASE, AMYLASE in the last 168 hours.  Recent Labs Lab 09/29/16 1738  AMMONIA 26   Coagulation Profile:  Recent Labs Lab 09/29/16 1350  INR 0.90   Cardiac Enzymes: No results for input(s): CKTOTAL, CKMB, CKMBINDEX, TROPONINI in the last 168 hours. BNP (last 3 results) No results for input(s): PROBNP in the last 8760 hours. HbA1C: No results for input(s): HGBA1C in the last 72 hours. CBG:  Recent Labs Lab 09/26/16 1240 09/29/16 1417 09/29/16 1825 09/29/16 2156 09/29/16 2355  GLUCAP 179* 308* 183* 156*  121*   Lipid Profile: No results for input(s): CHOL, HDL, LDLCALC, TRIG, CHOLHDL, LDLDIRECT in the last 72 hours. Thyroid Function Tests: No results for input(s): TSH, T4TOTAL, FREET4, T3FREE, THYROIDAB in the last 72 hours. Anemia Panel: No results for input(s): VITAMINB12, FOLATE, FERRITIN, TIBC, IRON, RETICCTPCT in the last 72 hours. Urine analysis:    Component Value Date/Time   COLORURINE AMBER (A) 09/20/2016 1404   APPEARANCEUR CLEAR 09/20/2016 1404   LABSPEC 1.012 09/20/2016 1404   PHURINE 5.0 09/20/2016 1404   GLUCOSEU 50 (A) 09/20/2016 1404   HGBUR NEGATIVE 09/20/2016 1404   BILIRUBINUR NEGATIVE 09/20/2016 1404   BILIRUBINUR negative 04/06/2013 1723   KETONESUR NEGATIVE 09/20/2016 1404   PROTEINUR 100 (A) 09/20/2016 1404   UROBILINOGEN 1.0 12/06/2013 1244   NITRITE NEGATIVE 09/20/2016 1404   LEUKOCYTESUR NEGATIVE 09/20/2016 1404  Sepsis Labs: Invalid input(s): PROCALCITONIN, LACTICIDVEN  No results found for this or any previous visit (from the past 240 hour(s)).    Radiology Studies: Ct Head Wo Contrast  Result Date: 09/29/2016 CLINICAL DATA:  Altered mental status EXAM: CT HEAD WITHOUT CONTRAST TECHNIQUE: Contiguous axial images were obtained from the base of the skull through the vertex without intravenous contrast. COMPARISON:  Head CT 09/20/2016 FINDINGS: Despite efforts by the technologist and patient, motion artifact is present on today's examination and could not be eliminated. The findings of this study are interpreted in the context of that limitation. Brain: Within the above limitation, there is no intracranial hemorrhage or mass effect identified. Skull: Normal visualized skull base, calvarium and extracranial soft tissues. Sinuses/Orbits: No sinus fluid levels or advanced mucosal thickening. No mastoid effusion. Normal orbits. IMPRESSION: 1. Severely motion degraded examination. 2. Within that limitation, no acute intracranial abnormality is visualized.  Electronically Signed   By: Ulyses Jarred M.D.   On: 09/29/2016 23:54   Dg Chest Port 1 View  Result Date: 09/30/2016 CLINICAL DATA:  Fever EXAM: PORTABLE CHEST 1 VIEW COMPARISON:  Chest radiograph 08/20/2016 FINDINGS: Cardiomediastinal silhouette remains mildly enlarged. There are diffuse peribronchial opacities, likely indicating pulmonary edema. Unchanged left basilar calcified granuloma. No pneumothorax or sizable pleural effusion. IMPRESSION: Shallow lung inflation with mild pulmonary edema. Electronically Signed   By: Ulyses Jarred M.D.   On: 09/30/2016 04:49   Ir Int Lianne Cure Biliary Drain With Cholangiogram  Result Date: 09/29/2016 INDICATION: 71 year old with pancreatic mass and biliary obstruction. Patient needs a tissue diagnosis and biliary decompression. Scheduled for PTC with biliary drain placement. EXAM: PLACEMENT OF AN INTERNAL/EXTERNAL BILIARY DRAIN WITH ULTRASOUND AND FLUOROSCOPIC GUIDANCE MEDICATIONS: Zosyn 3.375 g; The antibiotic was administered within an appropriate time frame prior to the initiation of the procedure. Hydralazine 10 mg ANESTHESIA/SEDATION: Moderate (conscious) sedation was employed during this procedure. A total of Versed 3.0 mg and Fentanyl 100 mcg was administered intravenously. Moderate Sedation Time: 27 minutes. The patient's level of consciousness and vital signs were monitored continuously by radiology nursing throughout the procedure under my direct supervision. FLUOROSCOPY TIME:  Fluoroscopy Time: 1 minutes and 24 seconds, 59 mGy CONTRAST:  20 mL [id number] COMPLICATIONS: None immediate. PROCEDURE: Informed written consent was obtained from the patient after a thorough discussion of the procedural risks, benefits and alternatives. All questions were addressed. Maximal Sterile Barrier Technique was utilized including caps, mask, sterile gowns, sterile gloves, sterile drape, hand hygiene and skin antiseptic. A timeout was performed prior to the initiation of the  procedure. Patient was placed supine. The anterior and right side of the abdomen were prepped and draped in a sterile fashion. The liver was evaluated with ultrasound. Mildly dilated left hepatic duct was targeted with ultrasound. The anterior abdomen was anesthetized with 1% lidocaine. Winthrop needle was directed into this mildly dilated intrahepatic bile duct with ultrasound guidance. A wire advanced easily into the biliary system under fluoroscopic guidance. A micropuncture dilator set was placed and contrast injection confirmed placement in the biliary system. At this point, the patient was very combative and required restraining in order to prevent the patient from coming off the table. The combativeness appear to be secondary to the sedation medicines rather than pain. 5 [place] Kumpe catheter and Glidewire were advanced across the distal biliary obstruction with minimal difficulty. Due to patient's combativeness and confusion, we had no opportunity to perform a brush biopsy. 10 [place] biliary drain was successfully placed over a stiff Amplatz  wire. Tip was placed in the duodenum. Small amount of bile was aspirated for culture. Drain was sutured to skin and secured with a Stat Lock. FINDINGS: Mild dilatation of the intrahepatic ducts. Common bile duct is moderately distended. There is an obstruction at the distal common bile duct. Catheter and wire were able to traverse the distal biliary obstruction. 10 French drain was successfully advanced into the duodenum. Unable to perform a brush biopsy due to patient's combative state during the procedure. IMPRESSION: Successful placement of an internal/external biliary drain. Patient will need to be scheduled at a later time for brush biopsy and potential internalization. Alternately, patient may need an EUS for tissue diagnosis. Electronically Signed   By: Markus Daft M.D.   On: 09/29/2016 17:50     Scheduled Meds: . amLODipine  5 mg Oral QHS  . benazepril   40 mg Oral QHS  . carvedilol  25 mg Oral BID AC  . colestipol  5 g Oral BID  . DULoxetine  60 mg Oral QHS  . gabapentin  300 mg Oral QHS  . insulin aspart  0-15 Units Subcutaneous TID WC  . insulin aspart  0-5 Units Subcutaneous QHS  . insulin glargine  17 Units Subcutaneous QHS  . pantoprazole  40 mg Oral QAC breakfast  . piperacillin-tazobactam (ZOSYN)  IV  3.375 g Intravenous Q8H   Continuous Infusions: . sodium chloride 100 mL/hr at 09/30/16 [place], MD, PhD [facility] Pager [phone]  If 7PM-7AM, please contact night-coverage [email or web address] Password [facility] 09/30/2016, 10:29 AM

## 2016-09-30 NOTE — Progress Notes (Signed)
[facility] Progress Note  Chief Complaint:   Pancreatic mass / jaundice Subjective: Feels okay, no specific complaints other than thirsty.   Objective:  Vital signs in last 24 hours: Temp:  [97.6 F (36.4 C)-103.1 F (39.5 C)] 97.8 F (36.6 C) (04/10 0800) Pulse Rate:  [70-126] 70 (04/10 0800) Resp:  [16-28] 20 (04/10 0800) BP: (113-218)/(50-115) 117/51 (04/10 0800) SpO2:  [92 %-100 %] 97 % (04/10 0800) Weight:  [181 lb 12.8 oz (82.5 kg)-188 lb (85.3 kg)] 188 lb (85.3 kg) (04/09 1745)   General:   Alert, well-developed,  in NAD EENT:  Normal hearing, icteric sclera.  Heart:  Regular rate and rhythm Pulm: Normal respiratory effort, lungs CTA bilaterally without wheezes or crackles. Abdomen:  Soft, mildly distended, normal bowel sounds, biliary drain >>thin bilious colored fluids.    Neurologic:  Alert and  oriented x4;  grossly normal neurologically. Psych:  Alert and cooperative. Normal mood and affect.   Intake/Output from previous day: 04/09 0701 - 04/10 0700 In: 1000  Out: 780 [Urine:140; Drains:520] Intake/Output this shift: No intake/output data recorded.  Lab Results:  Recent Labs  09/29/16 1350 09/30/16 0348  WBC 9.0 22.3*  HGB 12.1 10.5*  HCT 36.2 31.4*  PLT 271 251   BMET  Recent Labs  09/29/16 1350 09/30/16 0348  NA 137 138  K 4.0 3.9  CL 98* 104  CO2 30 28  GLUCOSE 320* 134*  BUN 14 19  CREATININE 1.15* 1.41*  CALCIUM 10.1 9.2   LFT  Recent Labs  09/30/16 0348  PROT 6.1*  ALBUMIN 2.4*  AST 55*  ALT 40  ALKPHOS 485*  BILITOT 3.5*   PT/INR  Recent Labs  09/29/16 1350  LABPROT 12.2  INR 0.90    Ct Head Wo Contrast  Result Date: 09/29/2016 CLINICAL DATA:  Altered mental status EXAM: CT HEAD WITHOUT CONTRAST TECHNIQUE: Contiguous axial images were obtained from the base of the skull through the vertex without intravenous contrast. COMPARISON:  Head CT 09/20/2016 FINDINGS: Despite efforts by the technologist  and patient, motion artifact is present on today's examination and could not be eliminated. The findings of this study are interpreted in the context of that limitation. Brain: Within the above limitation, there is no intracranial hemorrhage or mass effect identified. Skull: Normal visualized skull base, calvarium and extracranial soft tissues. Sinuses/Orbits: No sinus fluid levels or advanced mucosal thickening. No mastoid effusion. Normal orbits. IMPRESSION: 1. Severely motion degraded examination. 2. Within that limitation, no acute intracranial abnormality is visualized. Electronically Signed   By: Ulyses Jarred M.D.   On: 09/29/2016 23:54   Dg Chest Port 1 View  Result Date: 09/30/2016 CLINICAL DATA:  Fever EXAM: PORTABLE CHEST 1 VIEW COMPARISON:  Chest radiograph 08/20/2016 FINDINGS: Cardiomediastinal silhouette remains mildly enlarged. There are diffuse peribronchial opacities, likely indicating pulmonary edema. Unchanged left basilar calcified granuloma. No pneumothorax or sizable pleural effusion. IMPRESSION: Shallow lung inflation with mild pulmonary edema. Electronically Signed   By: Ulyses Jarred M.D.   On: 09/30/2016 04:49   Ir Int Lianne Cure Biliary Drain With Cholangiogram  Result Date: 09/29/2016 INDICATION: 71 year old with pancreatic mass and biliary obstruction. Patient needs a tissue diagnosis and biliary decompression. Scheduled for PTC with biliary drain placement. EXAM: PLACEMENT OF AN INTERNAL/EXTERNAL BILIARY DRAIN WITH ULTRASOUND AND FLUOROSCOPIC GUIDANCE MEDICATIONS: Zosyn 3.375 g; The antibiotic was administered within an appropriate time frame prior to the initiation of the procedure. Hydralazine 10 mg ANESTHESIA/SEDATION: Moderate (conscious) sedation was employed during  this procedure. A total of Versed 3.0 mg and Fentanyl 100 mcg was administered intravenously. Moderate Sedation Time: 27 minutes. The patient's level of consciousness and vital signs were monitored continuously by  radiology nursing throughout the procedure under my direct supervision. FLUOROSCOPY TIME:  Fluoroscopy Time: 1 minutes and 24 seconds, 59 mGy CONTRAST:  20 mL [id number] COMPLICATIONS: None immediate. PROCEDURE: Informed written consent was obtained from the patient after a thorough discussion of the procedural risks, benefits and alternatives. All questions were addressed. Maximal Sterile Barrier Technique was utilized including caps, mask, sterile gowns, sterile gloves, sterile drape, hand hygiene and skin antiseptic. A timeout was performed prior to the initiation of the procedure. Patient was placed supine. The anterior and right side of the abdomen were prepped and draped in a sterile fashion. The liver was evaluated with ultrasound. Mildly dilated left hepatic duct was targeted with ultrasound. The anterior abdomen was anesthetized with 1% lidocaine. 22 gauge [place] needle was directed into this mildly dilated intrahepatic bile duct with ultrasound guidance. A wire advanced easily into the biliary system under fluoroscopic guidance. A micropuncture dilator set was placed and contrast injection confirmed placement in the biliary system. At this point, the patient was very combative and required restraining in order to prevent the patient from coming off the table. The combativeness appear to be secondary to the sedation medicines rather than pain. 5 [place] Kumpe catheter and Glidewire were advanced across the distal biliary obstruction with minimal difficulty. Due to patient's combativeness and confusion, we had no opportunity to perform a brush biopsy. 10 [place] biliary drain was successfully placed over a stiff Amplatz wire. Tip was placed in the duodenum. Small amount of bile was aspirated for culture. Drain was sutured to skin and secured with a Stat Lock. FINDINGS: Mild dilatation of the intrahepatic ducts. Common bile duct is moderately distended. There is an obstruction at the distal common bile duct.  Catheter and wire were able to traverse the distal biliary obstruction. 10 French drain was successfully advanced into the duodenum. Unable to perform a brush biopsy due to patient's combative state during the procedure. IMPRESSION: Successful placement of an internal/external biliary drain. Patient will need to be scheduled at a later time for brush biopsy and potential internalization. Alternately, patient may need an EUS for tissue diagnosis. Electronically Signed   By: Richarda Overlie M.D.   On: 09/29/2016 17:50    Assessment / Plan:  1. 71 yo female with biliary obstruction / pancreatic head mass. ERCP was attempted but due to significant distortion of duodenum the papillae couldn't be seen so duct not cannulated. Patient is s/p internal /external biliary drain placement by IR on 4/9. Bilirubin declining now indicating decompression.  -Patient needs EUS with FNA. We had scheduled this to be done this Thursday morning but overnight patient became febrile (temp 103) with WBC of 22K this am. Blood cx pending. On zosyn. In light of new developments / ? Post-procedure infection,  EUS will be postponed until the following Thursday.  2. Duodenal ulcers ?  NSAID related. Biopsies negative -continue PPI  3. AKI. Cr 1.15 >>> 1.41. Apparently vomited juice last night. Has difficulty swallowing sometimes per husband. She is NPO.  -Will give maintenance IVF at / hr -am cmet   LOS: 0 days   Willette Cluster NP  09/30/2016, 9:20 AM  Pager number [phone]   ________________________________________________________________________  [facility] MD note:  I personally examined the patient, reviewed the data and agree with the assessment  and plan described above.  We are putting her on for EUS next Thursday as an outpatient.  My office will coordinate.  Please call prior to then with any questions or concerns.   Owens Loffler, MD [facility] Pager [phone]

## 2016-09-30 NOTE — Telephone Encounter (Signed)
EUS per Tye Savoy NP pancreatic mass 10/09/16 2 pm pt hospitalized and will be called tomorrow

## 2016-09-30 NOTE — Progress Notes (Addendum)
Nursing Note: Pt arouses easily.T-98.1 P-92 R-24 BP-123/54.PO2 95% on r/a.Pt Speech more normal and incont and changed again.Husband has been at the bedside and pt is resting quietly in bed.awake,alert,oriented x2 ,person,Place.MAE,well,PERL .wbb

## 2016-10-01 DIAGNOSIS — K219 Gastro-esophageal reflux disease without esophagitis: Secondary | ICD-10-CM

## 2016-10-01 DIAGNOSIS — R410 Disorientation, unspecified: Secondary | ICD-10-CM

## 2016-10-01 LAB — BASIC METABOLIC PANEL
ANION GAP: 8 (ref 5–15)
BUN: 32 mg/dL — ABNORMAL HIGH (ref 6–20)
CHLORIDE: 107 mmol/L (ref 101–111)
CO2: 22 mmol/L (ref 22–32)
Calcium: 8.4 mg/dL — ABNORMAL LOW (ref 8.9–10.3)
Creatinine, Ser: 1.79 mg/dL — ABNORMAL HIGH (ref 0.44–1.00)
GFR calc Af Amer: 31 mL/min — ABNORMAL LOW (ref >60)
GFR, EST NON AFRICAN AMERICAN: 27 mL/min — AB (ref >60)
Glucose, Bld: 120 mg/dL — ABNORMAL HIGH (ref 65–99)
POTASSIUM: 3.8 mmol/L (ref 3.5–5.1)
Sodium: 137 mmol/L (ref 135–145)

## 2016-10-01 LAB — CBC
HEMATOCRIT: 27.3 % — AB (ref 36.0–46.0)
HEMOGLOBIN: 9.2 g/dL — AB (ref 12.0–15.0)
MCH: 32.6 pg (ref 26.0–34.0)
MCHC: 33.7 g/dL (ref 30.0–36.0)
MCV: 96.8 fL (ref 78.0–100.0)
Platelets: 202 10*3/uL (ref 150–400)
RBC: 2.82 MIL/uL — ABNORMAL LOW (ref 3.87–5.11)
RDW: 14.5 % (ref 11.5–15.5)
WBC: 9.9 10*3/uL (ref 4.0–10.5)

## 2016-10-01 LAB — GLUCOSE, CAPILLARY
GLUCOSE-CAPILLARY: 329 mg/dL — AB (ref 65–99)
Glucose-Capillary: 115 mg/dL — ABNORMAL HIGH (ref 65–99)
Glucose-Capillary: 91 mg/dL (ref 65–99)
Glucose-Capillary: 218 mg/dL — ABNORMAL HIGH (ref 65–99)
Glucose-Capillary: 235 mg/dL — ABNORMAL HIGH (ref 65–99)

## 2016-10-01 MED ORDER — POTASSIUM CHLORIDE CRYS ER 20 MEQ PO TBCR
40.0000 meq | EXTENDED_RELEASE_TABLET | Freq: Once | ORAL | Status: AC
  Administered 2016-10-01: 40 meq via ORAL
  Filled 2016-10-01: qty 2

## 2016-10-01 NOTE — Progress Notes (Signed)
PROGRESS NOTE    Patricia Austin  VHQ:[id number] DOB: 05-25-1946 DOA: 09/29/2016 PCP: Chevis Pretty, FNP  Brief Narrative: 71 y.o.femalewith medical history significant of hypertension, type 2 diabetes mellitus, poorly controlled, obesity, hyperlipidemia, fibromyalgia, chronic kidney disease stage III, who was admitted 4/9 to the hospital from interventional radiology following PTC drain placement.  She was recently diagnosed with a pancreatic mass and biliary obstruction.  ERCP was unsuccessful as an outpatient and she was referred for interventional radiology. On 4/10, she developed a fever of 103F and became tachycardic, tachypneic, and confused and had leukocytosis of 22,000 and a negative lactic acid. She was started on empiric Zosyn for suspected cholangitis while awaiting blood and drain cultures.   Assessment & Plan:    Active Problems:   Pancreatic mass   Biliary obstruction   Diabetes (HCC)   Hypertension   CKD (chronic kidney disease) stage 3, GFR 30-59 ml/min   Fibromyalgia   GERD (gastroesophageal reflux disease)   Pancreatic mass with biliary obstruction -Status post PTC drain placement by interventional radiology on 4/9.  Post procedure she was admitted to the hospital for close observation.  She was agitated during the procedure and biopsies could not be obtained -Bilirubin decreased from 4.5 to 3.5 from 4/9 to 4/10. -Gastroenterology will see her in outpatient next Thursday for a scheduled EUS/biopsy.  Sepsis -Presented with temperature of 103.1, WBCs 22.3 and acute cholangitis. -Given IV fluids and started on antibiotics. -Currently sepsis physiology resolved, afebrile, WBCs at 9.9. -No evidence of end organ damage with normal lactic acid.  Acute cholangitis -Presented with obstructive jaundice from pancreatic mass, fever and RUQ abdominal pain. -Continue Zosyn today while blood cultures and cultures from the drain are still pending.  -The biliary obstruction  relieved by internal/external drain, doing much better, continue Zosyn for now. -Blood cultures NGTD  Type 2 diabetes mellitus, poorly controlled, with renal complications - Continue SSI and Lantus, CBG 115-120 today  Chronic kidney disease stage III -Creatinine elevated from 1.4 to 1.7 overnight. Continue IVF.  - GFR declining 46 > 36 > 27 between 4/9-4/11.  - Continue to hold Benazepril   Acute encephalopathy -Patient with intermittent confusion at home in the last month, as well as encephalopathic episode during IR.  - Ammonia on 4/9 WNL. She is largely back to baseline now.  -Concern on 4/10 about ability to swallow, she was seen by a speech therapist who saw no indication of oropharyngeal dysphagia and recommends regular/thin diet.  Hypertension -Stable 131/70, Hydralazine as needed -Hold Benazepril, Norvasc.  - Continue Coreg  Fibromyalgia Continue Neurontin  GERD Continue Protonix  DVT prophylaxis: SCD Code Status: Full Family Communication: Husband at bedside and informed of plan of care Disposition Plan: Home   Consultants:   Gastroenterology  Interventional radiology  Procedures:  - internal/external biliary drain placement by IR on 4/9  Antimicrobials:   Zosyn 4/10 >>   Subjective: Patient states she slept well last night. Denies fever/chills, chest pain, shortness of breath.   Objective: Vitals:   09/30/16 0800 09/30/16 1535 09/30/16 2142 10/01/16 0603  BP: (!) 117/51 (!) 120/50 (!) 142/59 131/70  Pulse: 70 89 83 96  Resp: '20 16 20 20  '$ Temp: 97.8 F (36.6 C) 98.5 F (36.9 C) 98.6 F (37 C) 98.3 F (36.8 C)  TempSrc: Oral Oral Oral Oral  SpO2: 97% 98% 96% 97%  Weight:      Height:        Intake/Output Summary (Last 24 hours) at 10/01/16  [place] filed at 10/01/16 0630  Gross per 24 hour  Intake          2143.33 ml  Output             1325 ml  Net           818.33 ml   Filed Weights   09/29/16 1332 09/29/16 1745  Weight:  82.5 kg (181 lb 12.8 oz) 85.3 kg (188 lb)    Examination:  General exam: Appears calm and comfortable  Respiratory system: Clear to auscultation. Respiratory effort normal. Cardiovascular system: S1 & S2 heard, RRR. No JVD, murmurs, rubs, gallops or clicks.  Gastrointestinal system: Abdomen is nondistended, soft and nontender. Normal bowel sounds heard. Central nervous system: Alert and oriented.  Extremities: Symmetric 5 x 5 power. Skin: No rashes, lesions or ulcers Psychiatry: Judgement and insight appear normal. Mood & affect appropriate.    Data Reviewed: I have personally reviewed following labs and imaging studies  CBC:  Recent Labs Lab 09/24/16 1427 09/29/16 1350 09/30/16 0348 10/01/16 0813  WBC 7.5 9.0 22.3* 9.9  NEUTROABS 5.7 6.7 19.9*  --   HGB 11.5* 12.1 10.5* 9.2*  HCT 33.7* 36.2 31.4* 27.3*  MCV 95.2 95.0 95.2 96.8  PLT 288.0 271 251 935   Basic Metabolic Panel:  Recent Labs Lab 09/24/16 1427 09/29/16 1350 09/30/16 0348 10/01/16 0813  NA 134* 137 138 137  K 4.3 4.0 3.9 3.8  CL 100 98* 104 107  CO2 '28 30 28 22  '$ GLUCOSE 329* 320* 134* 120*  BUN '20 14 19 '$ 32*  CREATININE 1.39* 1.15* 1.41* 1.79*  CALCIUM 9.7 10.1 9.2 8.4*   GFR: Estimated Creatinine Clearance: 29.4 mL/min (A) (by C-G formula based on SCr of 1.79 mg/dL (H)). Liver Function Tests:  Recent Labs Lab 09/24/16 1427 09/29/16 1350 09/30/16 0348  AST 49* 56* 55*  ALT 40* 43 40  ALKPHOS 610* 675* 485*  BILITOT 3.9* 4.5* 3.5*  PROT 6.8 7.6 6.1*  ALBUMIN 3.4* 3.2* 2.4*    Recent Labs Lab 09/29/16 1738  AMMONIA 26   Coagulation Profile:  Recent Labs Lab 09/29/16 1350  INR 0.90   Cardiac Enzymes: No results for input(s): CKTOTAL, CKMB, CKMBINDEX, TROPONINI in the last 168 hours. BNP (last 3 results) No results for input(s): PROBNP in the last 8760 hours. HbA1C: No results for input(s): HGBA1C in the last 72 hours. CBG:  Recent Labs Lab 09/30/16 0957 09/30/16 1207  09/30/16 1652 09/30/16 2138 10/01/16 0741  GLUCAP 136* 280* 107* 329* 115*   Sepsis Labs:  Recent Labs Lab 09/30/16 0653  LATICACIDVEN 0.8    Recent Results (from the past 240 hour(s))  Culture, blood (routine x 2)     Status: None (Preliminary result)   Collection Time: 09/29/16 10:56 PM  Result Value Ref Range Status   Specimen Description BLOOD LEFT ANTECUBITAL  Final   Special Requests   Final    BOTTLES DRAWN AEROBIC AND ANAEROBIC Blood Culture results may not be optimal due to an excessive volume of blood received in culture bottles   Culture   Final    NO GROWTH 1 DAY Performed at [facility] [address], [place], [place] [address]    Report Status PENDING  Incomplete  Culture, blood (routine x 2)     Status: None (Preliminary result)   Collection Time: 09/29/16 10:56 PM  Result Value Ref Range Status   Specimen Description BLOOD LEFT HAND  Final  Special Requests   Final    BOTTLES DRAWN AEROBIC ONLY Blood Culture adequate volume   Culture   Final    NO GROWTH 1 DAY Performed at [facility], [place] [address], [place], [place] [address]    Report Status PENDING  Incomplete         Radiology Studies: Ct Head Wo Contrast  Result Date: 09/29/2016 CLINICAL DATA:  Altered mental status EXAM: CT HEAD WITHOUT CONTRAST TECHNIQUE: Contiguous axial images were obtained from the base of the skull through the vertex without intravenous contrast. COMPARISON:  Head CT 09/20/2016 FINDINGS: Despite efforts by the technologist and patient, motion artifact is present on today's examination and could not be eliminated. The findings of this study are interpreted in the context of that limitation. Brain: Within the above limitation, there is no intracranial hemorrhage or mass effect identified. Skull: Normal visualized skull base, calvarium and extracranial soft tissues. Sinuses/Orbits: No sinus fluid levels or advanced mucosal thickening. No mastoid effusion. Normal  orbits. IMPRESSION: 1. Severely motion degraded examination. 2. Within that limitation, no acute intracranial abnormality is visualized. Electronically Signed   By: Ulyses Jarred M.D.   On: 09/29/2016 23:54   Dg Chest Port 1 View  Result Date: 09/30/2016 CLINICAL DATA:  Fever EXAM: PORTABLE CHEST 1 VIEW COMPARISON:  Chest radiograph 08/20/2016 FINDINGS: Cardiomediastinal silhouette remains mildly enlarged. There are diffuse peribronchial opacities, likely indicating pulmonary edema. Unchanged left basilar calcified granuloma. No pneumothorax or sizable pleural effusion. IMPRESSION: Shallow lung inflation with mild pulmonary edema. Electronically Signed   By: Ulyses Jarred M.D.   On: 09/30/2016 04:49   Ir Int Lianne Cure Biliary Drain With Cholangiogram  Result Date: 09/29/2016 INDICATION: 71 year old with pancreatic mass and biliary obstruction. Patient needs a tissue diagnosis and biliary decompression. Scheduled for PTC with biliary drain placement. EXAM: PLACEMENT OF AN INTERNAL/EXTERNAL BILIARY DRAIN WITH ULTRASOUND AND FLUOROSCOPIC GUIDANCE MEDICATIONS: Zosyn 3.375 g; The antibiotic was administered within an appropriate time frame prior to the initiation of the procedure. Hydralazine 10 mg ANESTHESIA/SEDATION: Moderate (conscious) sedation was employed during this procedure. A total of Versed 3.0 mg and Fentanyl 100 mcg was administered intravenously. Moderate Sedation Time: 27 minutes. The patient's level of consciousness and vital signs were monitored continuously by radiology nursing throughout the procedure under my direct supervision. FLUOROSCOPY TIME:  Fluoroscopy Time: 1 minutes and 24 seconds, 59 mGy CONTRAST:  20 mL [id number] COMPLICATIONS: None immediate. PROCEDURE: Informed written consent was obtained from the patient after a thorough discussion of the procedural risks, benefits and alternatives. All questions were addressed. Maximal Sterile Barrier Technique was utilized including caps, mask,  sterile gowns, sterile gloves, sterile drape, hand hygiene and skin antiseptic. A timeout was performed prior to the initiation of the procedure. Patient was placed supine. The anterior and right side of the abdomen were prepped and draped in a sterile fashion. The liver was evaluated with ultrasound. Mildly dilated left hepatic duct was targeted with ultrasound. The anterior abdomen was anesthetized with 1% lidocaine. [place] needle was directed into this mildly dilated intrahepatic bile duct with ultrasound guidance. A wire advanced easily into the biliary system under fluoroscopic guidance. A micropuncture dilator set was placed and contrast injection confirmed placement in the biliary system. At this point, the patient was very combative and required restraining in order to prevent the patient from coming off the table. The combativeness appear to be secondary to the sedation medicines rather than pain. 5 [place] Kumpe catheter and Glidewire were advanced  across the distal biliary obstruction with minimal difficulty. Due to patient's combativeness and confusion, we had no opportunity to perform a brush biopsy. 10 [place] biliary drain was successfully placed over a stiff Amplatz wire. Tip was placed in the duodenum. Small amount of bile was aspirated for culture. Drain was sutured to skin and secured with a Stat Lock. FINDINGS: Mild dilatation of the intrahepatic ducts. Common bile duct is moderately distended. There is an obstruction at the distal common bile duct. Catheter and wire were able to traverse the distal biliary obstruction. 10 French drain was successfully advanced into the duodenum. Unable to perform a brush biopsy due to patient's combative state during the procedure. IMPRESSION: Successful placement of an internal/external biliary drain. Patient will need to be scheduled at a later time for brush biopsy and potential internalization. Alternately, patient may need an EUS for tissue diagnosis.  Electronically Signed   By: Markus Daft M.D.   On: 09/29/2016 17:50        Scheduled Meds: . carvedilol  25 mg Oral BID AC  . colestipol  5 g Oral BID  . DULoxetine  60 mg Oral QHS  . gabapentin  300 mg Oral QHS  . insulin aspart  0-15 Units Subcutaneous TID WC  . insulin aspart  0-5 Units Subcutaneous QHS  . insulin glargine  17 Units Subcutaneous QHS  . pantoprazole  40 mg Oral QAC breakfast  . piperacillin-tazobactam (ZOSYN)  IV  3.375 g Intravenous Q8H   Continuous Infusions: . sodium chloride 1,000 mL (10/01/16 0552)     LOS: 1 day    Time spent: 30 minutes  Hajar Sakhi, PA-Student   If 7PM-7AM, please contact night-coverage [email or web address] Password [email or web address] 10/01/2016, 12:03 PM   Birdie Hopes Pager: [phone] 10/01/2016, 2:51 PM

## 2016-10-01 NOTE — Progress Notes (Signed)
Patient ID: Patricia Austin, female   DOB: Feb 02, 1946, 71 y.o.   MRN: [id number]    Referring Physician(s): Gatha Mayer  Supervising Physician: Aletta Edouard  Patient Status: 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  Much less abdominal pain today.  Allergies: No known allergies  Medications: Prior to Admission medications   Medication Sig Start Date End Date Taking? Authorizing Provider  amLODipine (NORVASC) 5 MG tablet Take 1 tablet (5 mg total) by mouth daily. Patient taking differently: Take 5 mg by mouth at bedtime.  07/18/16  Yes Mary-Margaret Hassell Done, FNP  benazepril (LOTENSIN) 40 MG tablet Take 1 tablet (40 mg total) by mouth daily before breakfast. Patient taking differently: Take 40 mg by mouth at bedtime.  07/18/16  Yes Mary-Margaret Hassell Done, FNP  carvedilol (COREG) 25 MG tablet TAKE  (1)  TABLET TWICE A DAY WITH MEALS (BREAKFAST AND SUPPER) Patient taking differently: Take 25 mg by mouth 2 (two) times daily.  07/18/16  Yes Mary-Margaret Hassell Done, FNP  colestipol (COLESTID) 5 g packet Take 5 g by mouth 2 (two) times daily. 09/26/16  Yes Gatha Mayer, MD  diclofenac (VOLTAREN) 75 MG EC tablet TAKE (1) TABLET TWICE A DAY. Patient taking differently: Take 75 mg by mouth at bedtime.  07/18/16  Yes Mary-Margaret Hassell Done, FNP  DULoxetine (CYMBALTA) 60 MG capsule Take 1 capsule (60 mg total) by mouth 2 (two) times daily. Patient taking differently: Take 60 mg by mouth at bedtime.  07/18/16  Yes Mary-Margaret Hassell Done, FNP  furosemide (LASIX) 20 MG tablet Take 1 tablet (20 mg total) by mouth daily. Patient taking differently: Take 20 mg by mouth 4 (four) times a week.  07/18/16  Yes Mary-Margaret Hassell Done, FNP  gabapentin (NEURONTIN) 300 MG capsule TAKE (1) CAPSULE TWICE DAILY. Patient taking differently: Take 300 mg by mouth at bedtime.  07/18/16  Yes Mary-Margaret Hassell Done, FNP  glipiZIDE-metformin (METAGLIP)  5-500 MG tablet Take 2 tablets by mouth 2 (two) times daily before a meal. Patient taking differently: Take 2 tablets by mouth at bedtime.  07/18/16  Yes Mary-Margaret Hassell Done, FNP  glucose blood (ONE TOUCH ULTRA TEST) test strip Test BID and prn  E11.42 01/30/15  Yes Mary-Margaret Hassell Done, FNP  HYDROcodone-acetaminophen (NORCO) 7.5-325 MG tablet Take 1 tablet by mouth every 6 (six) hours as needed for moderate pain. 08/21/16  Yes Ashok Pall, MD  Insulin Glargine (TOUJEO SOLOSTAR) 300 UNIT/ML SOPN Inject 60 Units into the skin daily. Patient taking differently: Inject 40-50 Units into the skin at bedtime.  08/15/16  Yes Mary-Margaret Hassell Done, FNP  Insulin Pen Needle (INSUPEN PEN NEEDLES) 32G X 4 MM MISC 1 each by Does not apply route daily. 01/30/15  Yes Mary-Margaret Hassell Done, FNP  Lancet Devices (ONE TOUCH DELICA LANCING DEV) MISC 1 each by Does not apply route 2 (two) times daily. Dx   E11.42 01/30/15  Yes Mary-Margaret Hassell Done, FNP  oxyCODONE-acetaminophen (ROXICET) 5-325 MG tablet Take 1-2 tablets by mouth every 4 (four) hours as needed. Patient taking differently: Take 1-2 tablets by mouth every 4 (four) hours as needed (pain.).  05/20/16  Yes Mary-Margaret Hassell Done, FNP  pantoprazole (PROTONIX) 40 MG tablet Take 1 tablet (40 mg total) by mouth daily before breakfast. 09/26/16  Yes Gatha Mayer, MD    Vital Signs: BP (!) 106/44 (BP Location: Left Arm)   Pulse 73   Temp 98.8 F (37.1 C) (Oral)   Resp 18   Ht '5\' 3"'$  (  1.6 m)   Wt 188 lb (85.3 kg)   SpO2 98%   BMI 33.30 kg/m   Physical Exam: abd: soft, minimally tender, drain with clear bilious output, +BS, drain site is c/d/i  Imaging: Ct Head Wo Contrast  Result Date: 09/29/2016 CLINICAL DATA:  Altered mental status EXAM: CT HEAD WITHOUT CONTRAST TECHNIQUE: Contiguous axial images were obtained from the base of the skull through the vertex without intravenous contrast. COMPARISON:  Head CT 09/20/2016 FINDINGS: Despite efforts by the technologist and  patient, motion artifact is present on today's examination and could not be eliminated. The findings of this study are interpreted in the context of that limitation. Brain: Within the above limitation, there is no intracranial hemorrhage or mass effect identified. Skull: Normal visualized skull base, calvarium and extracranial soft tissues. Sinuses/Orbits: No sinus fluid levels or advanced mucosal thickening. No mastoid effusion. Normal orbits. IMPRESSION: 1. Severely motion degraded examination. 2. Within that limitation, no acute intracranial abnormality is visualized. Electronically Signed   By: Ulyses Jarred M.D.   On: 09/29/2016 23:54   Dg Chest Port 1 View  Result Date: 09/30/2016 CLINICAL DATA:  Fever EXAM: PORTABLE CHEST 1 VIEW COMPARISON:  Chest radiograph 08/20/2016 FINDINGS: Cardiomediastinal silhouette remains mildly enlarged. There are diffuse peribronchial opacities, likely indicating pulmonary edema. Unchanged left basilar calcified granuloma. No pneumothorax or sizable pleural effusion. IMPRESSION: Shallow lung inflation with mild pulmonary edema. Electronically Signed   By: Ulyses Jarred M.D.   On: 09/30/2016 04:49   Ir Int Lianne Cure Biliary Drain With Cholangiogram  Result Date: 09/29/2016 INDICATION: 71 year old with pancreatic mass and biliary obstruction. Patient needs a tissue diagnosis and biliary decompression. Scheduled for PTC with biliary drain placement. EXAM: PLACEMENT OF AN INTERNAL/EXTERNAL BILIARY DRAIN WITH ULTRASOUND AND FLUOROSCOPIC GUIDANCE MEDICATIONS: Zosyn 3.375 g; The antibiotic was administered within an appropriate time frame prior to the initiation of the procedure. Hydralazine 10 mg ANESTHESIA/SEDATION: Moderate (conscious) sedation was employed during this procedure. A total of Versed 3.0 mg and Fentanyl 100 mcg was administered intravenously. Moderate Sedation Time: 27 minutes. The patient's level of consciousness and vital signs were monitored continuously by radiology  nursing throughout the procedure under my direct supervision. FLUOROSCOPY TIME:  Fluoroscopy Time: 1 minutes and 24 seconds, 59 mGy CONTRAST:  20 mL [id number] COMPLICATIONS: None immediate. PROCEDURE: Informed written consent was obtained from the patient after a thorough discussion of the procedural risks, benefits and alternatives. All questions were addressed. Maximal Sterile Barrier Technique was utilized including caps, mask, sterile gowns, sterile gloves, sterile drape, hand hygiene and skin antiseptic. A timeout was performed prior to the initiation of the procedure. Patient was placed supine. The anterior and right side of the abdomen were prepped and draped in a sterile fashion. The liver was evaluated with ultrasound. Mildly dilated left hepatic duct was targeted with ultrasound. The anterior abdomen was anesthetized with 1% lidocaine. [place] needle was directed into this mildly dilated intrahepatic bile duct with ultrasound guidance. A wire advanced easily into the biliary system under fluoroscopic guidance. A micropuncture dilator set was placed and contrast injection confirmed placement in the biliary system. At this point, the patient was very combative and required restraining in order to prevent the patient from coming off the table. The combativeness appear to be secondary to the sedation medicines rather than pain. 5 [place] Kum[place] catheter and Glidewire were advanced across the distal biliary obstruction with minimal difficulty. Due to patient's combativeness and confusion, we had no opportunity to perform a brush  biopsy. 10 [place] biliary drain was successfully placed over a stiff Amplatz wire. Tip was placed in the duodenum. Small amount of bile was aspirated for culture. Drain was sutured to skin and secured with a Stat Lock. FINDINGS: Mild dilatation of the intrahepatic ducts. Common bile duct is moderately distended. There is an obstruction at the distal common bile duct. Catheter and  wire were able to traverse the distal biliary obstruction. 10 French drain was successfully advanced into the duodenum. Unable to perform a brush biopsy due to patient's combative state during the procedure. IMPRESSION: Successful placement of an internal/external biliary drain. Patient will need to be scheduled at a later time for brush biopsy and potential internalization. Alternately, patient may need an EUS for tissue diagnosis. Electronically Signed   By: Markus Daft M.D.   On: 09/29/2016 17:50    Labs:  CBC:  Recent Labs  09/24/16 1427 09/29/16 1350 09/30/16 0348 10/01/16 0813  WBC 7.5 9.0 22.3* 9.9  HGB 11.5* 12.1 10.5* 9.2*  HCT 33.7* 36.2 31.4* 27.3*  PLT 288.0 271 251 202    COAGS:  Recent Labs  09/20/16 1314 09/29/16 1350  INR 0.86 0.90  APTT 33  --     BMP:  Recent Labs  09/20/16 1314 09/24/16 1427 09/29/16 1350 09/30/16 0348 10/01/16 0813  NA 135 134* 137 138 137  K 4.2 4.3 4.0 3.9 3.8  CL 101 100 98* 104 107  CO2 '25 28 30 28 22  '$ GLUCOSE 168* 329* 320* 134* 120*  BUN 22* '20 14 19 '$ 32*  CALCIUM 9.8 9.7 10.1 9.2 8.4*  CREATININE 1.27* 1.39* 1.15* 1.41* 1.79*  GFRNONAA 41*  --  46* 36* 27*  GFRAA 48*  --  54* 42* 31*    LIVER FUNCTION TESTS:  Recent Labs  09/20/16 1314 09/24/16 1427 09/29/16 1350 09/30/16 0348  BILITOT 4.2* 3.9* 4.5* 3.5*  AST 53* 49* 56* 55*  ALT 42 40* 43 40  ALKPHOS 609* 610* 675* 485*  PROT 6.6 6.8 7.6 6.1*  ALBUMIN 2.7* 3.4* 3.2* 2.4*    Assessment and Plan: 1. 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  WBC is normal today and the patient is feeling significantly better. -cont drain placement for now.  Patient will undergo EUS as outpatient. -we will follow  Electronically Signed: Karlisha Mathena E 10/01/2016, 3:36 PM   I spent a total of 15 Minutes at the the patient's bedside AND on the patient's hospital floor  or unit, greater than 50% of which was counseling/coordinating care for obstructive jaundice

## 2016-10-01 NOTE — Progress Notes (Signed)
PHARMACY NOTE -  [facility] has been assisting with dosing of Zosyn for r/o cholangitis. Dosage remains stable at 3.375 g IV q8 hr and need for further dosage adjustment appears unlikely at present.    Will sign off at this time.  Please reconsult if a change in clinical status warrants re-evaluation of dosage.  Reuel Boom, PharmD, BCPS Pager: [phone] 10/01/2016, 3:00 PM

## 2016-10-01 NOTE — Telephone Encounter (Signed)
EUS scheduled, pt instructed and medications reviewed.  Patient instructions mailed to home.  Patient to call with any questions or concerns.  

## 2016-10-02 DIAGNOSIS — R509 Fever, unspecified: Secondary | ICD-10-CM

## 2016-10-02 LAB — GLUCOSE, CAPILLARY
Glucose-Capillary: 233 mg/dL — ABNORMAL HIGH (ref 65–99)
Glucose-Capillary: 218 mg/dL — ABNORMAL HIGH (ref 65–99)

## 2016-10-02 LAB — URINE CULTURE: Culture: NO GROWTH

## 2016-10-02 LAB — COMPREHENSIVE METABOLIC PANEL
ALBUMIN: 2.2 g/dL — AB (ref 3.5–5.0)
ALT: 25 U/L (ref 14–54)
AST: 29 U/L (ref 15–41)
Alkaline Phosphatase: 271 U/L — ABNORMAL HIGH (ref 38–126)
Anion gap: 8 (ref 5–15)
BILIRUBIN TOTAL: 2.5 mg/dL — AB (ref 0.3–1.2)
BUN: 31 mg/dL — AB (ref 6–20)
CALCIUM: 8.5 mg/dL — AB (ref 8.9–10.3)
CO2: 21 mmol/L — ABNORMAL LOW (ref 22–32)
CREATININE: 1.92 mg/dL — AB (ref 0.44–1.00)
Chloride: 108 mmol/L (ref 101–111)
GFR calc Af Amer: 29 mL/min — ABNORMAL LOW (ref >60)
GFR calc non Af Amer: 25 mL/min — ABNORMAL LOW (ref >60)
GLUCOSE: 265 mg/dL — AB (ref 65–99)
Potassium: 4.4 mmol/L (ref 3.5–5.1)
SODIUM: 137 mmol/L (ref 135–145)
TOTAL PROTEIN: 5.8 g/dL — AB (ref 6.5–8.1)

## 2016-10-02 LAB — CBC
HEMATOCRIT: 26.9 % — AB (ref 36.0–46.0)
HEMOGLOBIN: 8.9 g/dL — AB (ref 12.0–15.0)
MCH: 32.4 pg (ref 26.0–34.0)
MCHC: 33.1 g/dL (ref 30.0–36.0)
MCV: 97.8 fL (ref 78.0–100.0)
Platelets: 212 10*3/uL (ref 150–400)
RBC: 2.75 MIL/uL — AB (ref 3.87–5.11)
RDW: 14.5 % (ref 11.5–15.5)
WBC: 6.8 10*3/uL (ref 4.0–10.5)

## 2016-10-02 MED ORDER — BENAZEPRIL HCL 40 MG PO TABS: 40.0000 mg | ORAL_TABLET | Freq: Every day | ORAL | Status: DC

## 2016-10-02 MED ORDER — AMOXICILLIN-POT CLAVULANATE 500-125 MG PO TABS: 1.0000 | ORAL_TABLET | Freq: Two times a day (BID) | ORAL | 0 refills | Status: DC

## 2016-10-02 NOTE — Discharge Summary (Signed)
Physician Discharge Summary  Patricia Austin VOH:[id number] DOB: 09-08-1945 DOA: 09/29/2016  PCP: Chevis Pretty, FNP  Admit date: 09/29/2016 Discharge date: 10/02/2016  Admitted From: Home Disposition: Home  Recommendations for Outpatient Follow-up:  1. Follow up with PCP in 1-2 weeks 2. Please obtain BMP/CBC in one week. Creatinine slightly elevated at 1.9 on discharge, follow-up with PCP 3. Augmentin for 5 more days.  Home Health: NA Equipment/Devices:NA  Discharge Condition: Stable CODE STATUS: Full Code Diet recommendation: Diet Carb Modified Fluid consistency: Thin; Room service appropriate? Yes Diet - low sodium heart healthy  Brief/Interim Summary: 71 y.o.femalewith medical history significant of hypertension, type 2 diabetes mellitus, poorly controlled, obesity, hyperlipidemia, fibromyalgia, chronic kidney disease stage III, who was admitted 4/9to the hospital from interventional radiology following PTC drain placement. She was recently diagnosed with a pancreatic mass and biliary obstruction. ERCP was unsuccessful as an outpatient and she was referred for interventional radiology. On 4/10, she developed a fever of 103F and became tachycardic, tachypneic, and confused and had leukocytosis of 22,000 and a negative lactic acid. She was started on empiric Zosyn for suspected cholangitis while awaiting blood and drain cultures.   Discharge Diagnoses:  Active Problems:   Diabetes (HCC)   Hypertension   GERD (gastroesophageal reflux disease)   Fibromyalgia   CKD (chronic kidney disease) stage 3, GFR 30-59 ml/min   Pancreatic mass   Biliary obstruction   Pancreatic mass with biliary obstruction -Status post [facility] internal/external drain placement by interventional radiology on 4/9.  -Post procedure she was admitted to the hospital for close observation. biopsies was not obtained -Bilirubin decreased from 4.5 to 2.5 on the day of discharge. -Gastroenterology will see her in  outpatient next Thursday for a scheduled EUS/biopsy.  Sepsis -Presented with temperature of 103.1, WBCs 22.3 and acute cholangitis. -Given IV fluids and started on antibiotics. -Currently sepsis physiology resolved, afebrile, WBCs at 9.9. -No evidence of end organ damage with normal lactic acid. -Blood cultures NGTD at the time of discharge, discharged on Augmentin for 5 more days.  Acute cholangitis -Presented with obstructive jaundice from pancreatic mass, fever and RUQ abdominal pain. -Continue Zosyn today while blood cultures and cultures from the drain are still pending.  -The biliary obstruction relieved by internal/external drain, doing much better, continue Zosyn for now. -Blood and urine cultures NGTD.  Type 2 diabetes mellitus, poorly controlled, with renal complications - Continue SSI and Lantus, CBG 115-120 today  Chronic kidney disease stage III -Creatinine was slightly elevated at 1.9 on the day of discharge, follow-up with PCP. -Discharged continue hold benazepril and Lasix, check BMP next week, patient follow-up with PCP on that. -Voltaren discontinued because of CKD.  Acute encephalopathy -Patient with intermittent confusion at home in the last month, as well as encephalopathic episode during IR.  - Ammonia on 4/9 WNL. She is largely back to baseline now.  -Concern on 4/10 about ability to swallow, she was seen by a speech therapist who saw no indication of oropharyngeal dysphagia and recommends regular/thin diet.  Hypertension -Stable 131/70, Hydralazine as needed -Home medications restarted except benazepril on discharge.  Fibromyalgia Continue Neurontin  GERD Continue Protonix   Discharge Instructions  Discharge Instructions    Diet - low sodium heart healthy    Complete by:  As directed    Increase activity slowly    Complete by:  As directed      Allergies as of 10/02/2016      Reactions   No Known Allergies  Medication List     STOP taking these medications   diclofenac 75 MG EC tablet Commonly known as:  VOLTAREN     TAKE these medications   amLODipine 5 MG tablet Commonly known as:  NORVASC Take 1 tablet (5 mg total) by mouth daily. What changed:  when to take this   amoxicillin-clavulanate 500-125 MG tablet Commonly known as:  AUGMENTIN Take 1 tablet (500 mg total) by mouth 2 (two) times daily.   benazepril 40 MG tablet Commonly known as:  LOTENSIN Take 1 tablet (40 mg total) by mouth at bedtime.   carvedilol 25 MG tablet Commonly known as:  COREG TAKE  (1)  TABLET TWICE A DAY WITH MEALS (BREAKFAST AND SUPPER) What changed:  how much to take  how to take this  when to take this  additional instructions   colestipol 5 g packet Commonly known as:  COLESTID Take 5 g by mouth 2 (two) times daily.   DULoxetine 60 MG capsule Commonly known as:  CYMBALTA Take 1 capsule (60 mg total) by mouth 2 (two) times daily. What changed:  when to take this   furosemide 20 MG tablet Commonly known as:  LASIX Take 1 tablet (20 mg total) by mouth daily. What changed:  when to take this   gabapentin 300 MG capsule Commonly known as:  NEURONTIN TAKE (1) CAPSULE TWICE DAILY. What changed:  how much to take  how to take this  when to take this  additional instructions   glipiZIDE-metformin 5-500 MG tablet Commonly known as:  METAGLIP Take 2 tablets by mouth 2 (two) times daily before a meal. What changed:  when to take this   glucose blood test strip Commonly known as:  ONE TOUCH ULTRA TEST Test BID and prn  E11.42   HYDROcodone-acetaminophen 7.5-325 MG tablet Commonly known as:  NORCO Take 1 tablet by mouth every 6 (six) hours as needed for moderate pain.   Insulin Glargine 300 UNIT/ML Sopn Commonly known as:  TOUJEO SOLOSTAR Inject 60 Units into the skin daily. What changed:  how much to take  when to take this   Insulin Pen Needle 32G X 4 MM Misc Commonly known as:  INSUPEN  PEN NEEDLES 1 each by Does not apply route daily.   ONE TOUCH DELICA LANCING DEV Misc 1 each by Does not apply route 2 (two) times daily. Dx   E11.42   oxyCODONE-acetaminophen 5-325 MG tablet Commonly known as:  ROXICET Take 1-2 tablets by mouth every 4 (four) hours as needed. What changed:  reasons to take this   pantoprazole 40 MG tablet Commonly known as:  PROTONIX Take 1 tablet (40 mg total) by mouth daily before breakfast.       Allergies  Allergen Reactions  . No Known Allergies     Consultations:  IR  GI   Procedures (Echo, Carotid, EGD, Colonoscopy, ERCP)   Radiological studies: Ct Head Wo Contrast  Result Date: 09/29/2016 CLINICAL DATA:  Altered mental status EXAM: CT HEAD WITHOUT CONTRAST TECHNIQUE: Contiguous axial images were obtained from the base of the skull through the vertex without intravenous contrast. COMPARISON:  Head CT 09/20/2016 FINDINGS: Despite efforts by the technologist and patient, motion artifact is present on today's examination and could not be eliminated. The findings of this study are interpreted in the context of that limitation. Brain: Within the above limitation, there is no intracranial hemorrhage or mass effect identified. Skull: Normal visualized skull base, calvarium and extracranial soft tissues. Sinuses/Orbits:  No sinus fluid levels or advanced mucosal thickening. No mastoid effusion. Normal orbits. IMPRESSION: 1. Severely motion degraded examination. 2. Within that limitation, no acute intracranial abnormality is visualized. Electronically Signed   By: Ulyses Jarred M.D.   On: 09/29/2016 23:54   Ct Head Wo Contrast  Result Date: 09/20/2016 CLINICAL DATA:  Weakness. Left-sided numbness and tingling for 3 days. History of diabetes and hypertension. EXAM: CT HEAD WITHOUT CONTRAST TECHNIQUE: Contiguous axial images were obtained from the base of the skull through the vertex without intravenous contrast. COMPARISON:  06/02/2013 FINDINGS:  Brain: There is no evidence of acute cortical infarct, intracranial hemorrhage, mass, midline shift, or extra-axial fluid collection. Mild cerebral atrophy may have minimally progressed. Periventricular white matter hypodensities also appear slightly increased and are nonspecific but compatible with mild chronic small vessel ischemic disease. Vascular: Mild calcified atherosclerosis at the skullbase. No hyperdense vessel. Skull: No fracture or focal osseous lesion. Sinuses/Orbits: Visualized paranasal sinuses are clear. Minimal chronic air cell opacification at the mastoid tips. Unremarkable orbits. Other: None. IMPRESSION: 1. No evidence of acute intracranial abnormality. 2. Mild chronic small vessel ischemic disease. Electronically Signed   By: Logan Bores M.D.   On: 09/20/2016 13:48   Ct Abdomen Pelvis W Contrast  Result Date: 09/20/2016 CLINICAL DATA:  Transaminitis. Dysuria and lower abdominal pain. Lumbar spine surgery 1 month ago. EXAM: CT ABDOMEN AND PELVIS WITH CONTRAST TECHNIQUE: Multidetector CT imaging of the abdomen and pelvis was performed using the standard protocol following bolus administration of intravenous contrast. CONTRAST:  80 mL Isovue 300 COMPARISON:  None. FINDINGS: Lower chest: 5 mm right lower lobe lung nodule (series 4, image 12). 3 mm right lower lobe nodule (series 4, image 14). 5 mm calcified nodule in the left lower lobe. No lung consolidation or pleural effusion. Hepatobiliary: Diffusely decreased attenuation of the liver suggesting steatosis. Scattered calcified granulomas in the liver. Mild intrahepatic biliary dilatation. Common bile duct dilatation measuring 12 mm. 3.4 cm stone in the gallbladder without evidence of acute cholecystitis. Pancreas: There is an approximately 4.0 x 3.1 x 3.9 cm heterogeneously hypoenhancing mass at the level of the uncinate process of the pancreas. This may contact the posterior margin of the adjacent superior mesenteric artery and partially  encases some small adjacent SMA branch vessels. There is not a substantial enough amount of surrounding stranding to suggest that this represents pancreatitis rather than neoplasm. There is pancreatic atrophy without ductal dilatation. Spleen: Calcified granulomas in the spleen. Adrenals/Urinary Tract: Unremarkable adrenal glands. No evidence of renal calculi or hydronephrosis. Subcentimeter low-density renal lesions bilaterally are too small to fully characterize but most likely represent cysts. Unremarkable bladder. Stomach/Bowel: Apparent mild diffuse wall thickening of the visualized distal esophagus. The stomach is within normal limits. Duodenal diverticular noted. No evidence of bowel obstruction. Mild colonic diverticulosis without evidence of diverticulitis. Prior appendectomy. Vascular/Lymphatic: Minimal atherosclerotic plaque in the abdominal aorta without aneurysm. Small portacaval lymph nodes measure up to 7 mm in short axis. Reproductive: Prior hysterectomy.  No adnexal mass. Other: No intraperitoneal free fluid. Musculoskeletal: Postoperative changes from recent L3-L5 PLIF. IMPRESSION: 1. 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 Abdominal MRI is recommended for further evaluation. 2. Associated mild intrahepatic and extrahepatic biliary dilatation. 3. Small right lower lobe lung nodules measuring up to 5 mm, indeterminate. Metastatic disease is not excluded. 4. Cholelithiasis. 5. Hepatic steatosis. 6. Distal esophageal wall thickening, query esophagitis. Electronically Signed   By: Logan Bores  M.D.   On: 09/20/2016 21:13   Dg Chest Port 1 View  Result Date: 09/30/2016 CLINICAL DATA:  Fever EXAM: PORTABLE CHEST 1 VIEW COMPARISON:  Chest radiograph 08/20/2016 FINDINGS: Cardiomediastinal silhouette remains mildly enlarged. There are diffuse peribronchial opacities, likely indicating pulmonary edema. Unchanged left basilar  calcified granuloma. No pneumothorax or sizable pleural effusion. IMPRESSION: Shallow lung inflation with mild pulmonary edema. Electronically Signed   By: Ulyses Jarred M.D.   On: 09/30/2016 04:49   Dg C-arm 1-60 Min-no Report  Result Date: 09/26/2016 Fluoroscopy was utilized by the requesting physician.  No radiographic interpretation.   Ir Int Lianne Cure Biliary Drain With Cholangiogram  Result Date: 09/29/2016 INDICATION: 71 year old with pancreatic mass and biliary obstruction. Patient needs a tissue diagnosis and biliary decompression. Scheduled for PTC with biliary drain placement. EXAM: PLACEMENT OF AN INTERNAL/EXTERNAL BILIARY DRAIN WITH ULTRASOUND AND FLUOROSCOPIC GUIDANCE MEDICATIONS: Zosyn 3.375 g; The antibiotic was administered within an appropriate time frame prior to the initiation of the procedure. Hydralazine 10 mg ANESTHESIA/SEDATION: Moderate (conscious) sedation was employed during this procedure. A total of Versed 3.0 mg and Fentanyl 100 mcg was administered intravenously. Moderate Sedation Time: 27 minutes. The patient's level of consciousness and vital signs were monitored continuously by radiology nursing throughout the procedure under my direct supervision. FLUOROSCOPY TIME:  Fluoroscopy Time: 1 minutes and 24 seconds, 59 mGy CONTRAST:  20 mL [id number] COMPLICATIONS: None immediate. PROCEDURE: Informed written consent was obtained from the patient after a thorough discussion of the procedural risks, benefits and alternatives. All questions were addressed. Maximal Sterile Barrier Technique was utilized including caps, mask, sterile gowns, sterile gloves, sterile drape, hand hygiene and skin antiseptic. A timeout was performed prior to the initiation of the procedure. Patient was placed supine. The anterior and right side of the abdomen were prepped and draped in a sterile fashion. The liver was evaluated with ultrasound. Mildly dilated left hepatic duct was targeted with ultrasound. The  anterior abdomen was anesthetized with 1% lidocaine. Emerson needle was directed into this mildly dilated intrahepatic bile duct with ultrasound guidance. A wire advanced easily into the biliary system under fluoroscopic guidance. A micropuncture dilator set was placed and contrast injection confirmed placement in the biliary system. At this point, the patient was very combative and required restraining in order to prevent the patient from coming off the table. The combativeness appear to be secondary to the sedation medicines rather than pain. 5 [place] Kumpe catheter and Glidewire were advanced across the distal biliary obstruction with minimal difficulty. Due to patient's combativeness and confusion, we had no opportunity to perform a brush biopsy. 10 [place] biliary drain was successfully placed over a stiff Amplatz wire. Tip was placed in the duodenum. Small amount of bile was aspirated for culture. Drain was sutured to skin and secured with a Stat Lock. FINDINGS: Mild dilatation of the intrahepatic ducts. Common bile duct is moderately distended. There is an obstruction at the distal common bile duct. Catheter and wire were able to traverse the distal biliary obstruction. 10 French drain was successfully advanced into the duodenum. Unable to perform a brush biopsy due to patient's combative state during the procedure. IMPRESSION: Successful placement of an internal/external biliary drain. Patient will need to be scheduled at a later time for brush biopsy and potential internalization. Alternately, patient may need an EUS for tissue diagnosis. Electronically Signed   By: Markus Daft M.D.   On: 09/29/2016 17:50    Subjective:  Discharge Exam: Vitals:   10/01/16  1422 10/01/16 2122 10/02/16 0413 10/02/16 0700  BP: (!) 106/44 (!) 147/64 137/65 (!) 157/65  Pulse: 73 89 82 79  Resp: 18  13   Temp: 98.8 F (37.1 C) 98.6 F (37 C) 98.3 F (36.8 C)   TempSrc: Oral Oral Oral   SpO2: 98% 99% 100%    Weight:      Height:       General: Pt is alert, awake, not in acute distress Cardiovascular: RRR, S1/S2 +, no rubs, no gallops Respiratory: CTA bilaterally, no wheezing, no rhonchi Abdominal: Soft, NT, ND, bowel sounds + Extremities: no edema, no cyanosis   The results of significant diagnostics from this hospitalization (including imaging, microbiology, ancillary and laboratory) are listed below for reference.    Microbiology: Recent Results (from the past 240 hour(s))  Culture, blood (routine x 2)     Status: None (Preliminary result)   Collection Time: 09/29/16 10:56 PM  Result Value Ref Range Status   Specimen Description BLOOD LEFT ANTECUBITAL  Final   Special Requests   Final    BOTTLES DRAWN AEROBIC AND ANAEROBIC Blood Culture results may not be optimal due to an excessive volume of blood received in culture bottles   Culture   Final    NO GROWTH 1 DAY Performed at [facility] [address] [place], [place] [address]    Report Status PENDING  Incomplete  Culture, blood (routine x 2)     Status: None (Preliminary result)   Collection Time: 09/29/16 10:56 PM  Result Value Ref Range Status   Specimen Description BLOOD LEFT HAND  Final   Special Requests   Final    BOTTLES DRAWN AEROBIC ONLY Blood Culture adequate volume   Culture   Final    NO GROWTH 1 DAY Performed at [facility], [place] [address] [place], [place] [address]    Report Status PENDING  Incomplete  Culture, Urine     Status: None   Collection Time: 09/30/16 10:19 PM  Result Value Ref Range Status   Specimen Description URINE, RANDOM  Final   Special Requests NONE  Final   Culture   Final    NO GROWTH Performed at [facility], [place] [address], [place], [place] [address]    Report Status 10/02/2016 FINAL  Final     Labs: BNP (last 3 results) No results for input(s): BNP in the last 8760 hours. Basic Metabolic Panel:  Recent Labs Lab 09/29/16 1350 09/30/16 0348  10/01/16 0813 10/02/16 0403  NA 137 138 137 137  K 4.0 3.9 3.8 4.4  CL 98* 104 107 108  CO2 '30 28 22 '$ 21*  GLUCOSE 320* 134* 120* 265*  BUN 14 19 32* 31*  CREATININE 1.15* 1.41* 1.79* 1.92*  CALCIUM 10.1 9.2 8.4* 8.5*   Liver Function Tests:  Recent Labs Lab 09/29/16 1350 09/30/16 0348 10/02/16 0403  AST 56* 55* 29  ALT 43 40 25  ALKPHOS 675* 485* 271*  BILITOT 4.5* 3.5* 2.5*  PROT 7.6 6.1* 5.8*  ALBUMIN 3.2* 2.4* 2.2*   No results for input(s): LIPASE, AMYLASE in the last 168 hours.  Recent Labs Lab 09/29/16 1738  AMMONIA 26   CBC:  Recent Labs Lab 09/29/16 1350 09/30/16 0348 10/01/16 0813 10/02/16 0403  WBC 9.0 22.3* 9.9 6.8  NEUTROABS 6.7 19.9*  --   --   HGB 12.1 10.5* 9.2* 8.9*  HCT 36.2 31.4* 27.3* 26.9*  MCV 95.0 95.2 96.8 97.8  PLT  271 251 202 212   Cardiac Enzymes: No results for input(s): CKTOTAL, CKMB, CKMBINDEX, TROPONINI in the last 168 hours. BNP: Invalid input(s): POCBNP CBG:  Recent Labs Lab 10/01/16 0741 10/01/16 1202 10/01/16 1711 10/01/16 2117 10/02/16 0717  GLUCAP 115* 91 218* 235* 233*   D-Dimer No results for input(s): DDIMER in the last 72 hours. Hgb A1c No results for input(s): HGBA1C in the last 72 hours. Lipid Profile No results for input(s): CHOL, HDL, LDLCALC, TRIG, CHOLHDL, LDLDIRECT in the last 72 hours. Thyroid function studies No results for input(s): TSH, T4TOTAL, T3FREE, THYROIDAB in the last 72 hours.  Invalid input(s): FREET3 Anemia work up No results for input(s): VITAMINB12, FOLATE, FERRITIN, TIBC, IRON, RETICCTPCT in the last 72 hours. Urinalysis    Component Value Date/Time   COLORURINE YELLOW 09/30/2016 2218   APPEARANCEUR CLOUDY (A) 09/30/2016 2218   LABSPEC 1.020 09/30/2016 2218   PHURINE 5.0 09/30/2016 2218   GLUCOSEU NEGATIVE 09/30/2016 2218   HGBUR NEGATIVE 09/30/2016 2218   BILIRUBINUR SMALL (A) 09/30/2016 2218   BILIRUBINUR negative 04/06/2013 [place] 09/30/2016 2218    PROTEINUR 30 (A) 09/30/2016 2218   UROBILINOGEN 1.0 12/06/2013 1244   NITRITE NEGATIVE 09/30/2016 2218   LEUKOCYTESUR NEGATIVE 09/30/2016 2218   Sepsis Labs Invalid input(s): PROCALCITONIN,  WBC,  LACTICIDVEN Microbiology Recent Results (from the past 240 hour(s))  Culture, blood (routine x 2)     Status: None (Preliminary result)   Collection Time: 09/29/16 10:56 PM  Result Value Ref Range Status   Specimen Description BLOOD LEFT ANTECUBITAL  Final   Special Requests   Final    BOTTLES DRAWN AEROBIC AND ANAEROBIC Blood Culture results may not be optimal due to an excessive volume of blood received in culture bottles   Culture   Final    NO GROWTH 1 DAY Performed at [facility], [place] [address] [place], [place] [address]    Report Status PENDING  Incomplete  Culture, blood (routine x 2)     Status: None (Preliminary result)   Collection Time: 09/29/16 10:56 PM  Result Value Ref Range Status   Specimen Description BLOOD LEFT HAND  Final   Special Requests   Final    BOTTLES DRAWN AEROBIC ONLY Blood Culture adequate volume   Culture   Final    NO GROWTH 1 DAY Performed at [facility], [place] [address] [place], [place] [address]    Report Status PENDING  Incomplete  Culture, Urine     Status: None   Collection Time: 09/30/16 10:19 PM  Result Value Ref Range Status   Specimen Description URINE, RANDOM  Final   Special Requests NONE  Final   Culture   Final    NO GROWTH Performed at [facility], [place] [address], [place], [place] [address]    Report Status 10/02/2016 FINAL  Final     Time coordinating discharge: Over 30 minutes  SIGNED:   Birdie Hopes, MD  [facility] 10/02/2016, 10:52 AM Pager   If 7PM-7AM, please contact night-coverage [email or web address] Password [email or web address]

## 2016-10-02 NOTE — Progress Notes (Signed)
Discharge instructions reviewed with patient and husband. Patient's husband verbalized understanding of draining and changing dressing for bilary drain. Patient to be discharged via private vehicle with husband.

## 2016-10-03 ENCOUNTER — Encounter: Payer: Self-pay | Admitting: Nurse Practitioner

## 2016-10-03 ENCOUNTER — Ambulatory Visit (INDEPENDENT_AMBULATORY_CARE_PROVIDER_SITE_OTHER): Payer: Medicare Other | Admitting: Nurse Practitioner

## 2016-10-03 VITALS — BP 180/78 | HR 80 | Temp 97.9°F | Ht 63.0 in | Wt 189.0 lb

## 2016-10-03 DIAGNOSIS — R17 Unspecified jaundice: Secondary | ICD-10-CM

## 2016-10-03 DIAGNOSIS — Z09 Encounter for follow-up examination after completed treatment for conditions other than malignant neoplasm: Secondary | ICD-10-CM

## 2016-10-03 DIAGNOSIS — K869 Disease of pancreas, unspecified: Secondary | ICD-10-CM

## 2016-10-03 DIAGNOSIS — R748 Abnormal levels of other serum enzymes: Secondary | ICD-10-CM

## 2016-10-03 DIAGNOSIS — K8689 Other specified diseases of pancreas: Secondary | ICD-10-CM

## 2016-10-03 NOTE — Progress Notes (Signed)
   Subjective:    Patient ID: Patricia Austin, female    DOB: Apr 03, 1946, 71 y.o.   MRN: [id number]  HPI: Patient presents for hospital follow-up. Discharge date was 10-02-16, creatinine 1.9 at time of discharge. Patient obtains diagnosis of pancreatic mass with biliary obstruction, no biopsies taken because patient became very anxious .The did however put a drain in. Patient juandice upon arrival. Says she feels terrible. Has no energy, and stays nauseated  Hospital records reviewed  Review of Systems  Constitutional: Positive for fatigue.  Respiratory: Negative.   Cardiovascular: Negative.   Gastrointestinal: Positive for abdominal pain, nausea and vomiting.  Genitourinary: Negative.   Neurological: Negative.   Psychiatric/Behavioral: Negative.        Objective:   Physical Exam  Constitutional: She is oriented to person, place, and time. She appears well-developed and well-nourished.  HENT:  Nose: Nose normal.  Mouth/Throat: Oropharynx is clear and moist.  Eyes: EOM are normal. No scleral icterus.  Neck: Trachea normal, normal range of motion and full passive range of motion without pain. Neck supple. No JVD present. Carotid bruit is not present. No thyromegaly present.  Cardiovascular: Normal rate, regular rhythm, normal heart sounds and intact distal pulses.  Exam reveals no gallop and no friction rub.   No murmur heard. Pulmonary/Chest: Effort normal and breath sounds normal.  Abdominal: Soft. Bowel sounds are normal. She exhibits no distension and no mass. There is no tenderness.  Drain in abdomen draining bile  Musculoskeletal: Normal range of motion.  Lymphadenopathy:    She has no cervical adenopathy.  Neurological: She is alert and oriented to person, place, and time. She has normal reflexes.  Skin: Skin is warm and dry.  Mild jaundice  Psychiatric: She has a normal mood and affect. Her behavior is normal. Judgment and thought content normal.   BP (!) 180/78   Pulse 80    Temp 97.9 F (36.6 C) (Oral)   Ht '5\' 3"'$  (1.6 m)   Wt 189 lb (85.7 kg)   BMI 33.48 kg/m         Assessment & Plan:   1. Hospital discharge follow-up   2. Pancreatic mass   3. Jaundice   4. Elevated creatine kinase    Orders Placed This Encounter  Procedures  . [id number]+EGFR   Continue all meds Stop voltaren will await pancreatic bx report that is to be done next week  Mary-Margaret Hassell Done, FNP

## 2016-10-04 LAB — CMP14+EGFR
ALBUMIN: 3.5 g/dL (ref 3.5–4.8)
ALK PHOS: 353 IU/L — AB (ref 39–117)
ALT: 25 IU/L (ref 0–32)
AST: 29 IU/L (ref 0–40)
Albumin/Globulin Ratio: 1.2 (ref 1.2–2.2)
BUN/Creatinine Ratio: 14 (ref 12–28)
BUN: 21 mg/dL (ref 8–27)
Bilirubin Total: 2.6 mg/dL — ABNORMAL HIGH (ref 0.0–1.2)
CALCIUM: 9.5 mg/dL (ref 8.7–10.3)
CO2: 21 mmol/L (ref 18–29)
CREATININE: 1.53 mg/dL — AB (ref 0.57–1.00)
Chloride: 101 mmol/L (ref 96–106)
GFR calc Af Amer: 39 mL/min/{1.73_m2} — ABNORMAL LOW (ref >59)
GFR, EST NON AFRICAN AMERICAN: 34 mL/min/{1.73_m2} — AB (ref >59)
Globulin, Total: 2.9 g/dL (ref 1.5–4.5)
Glucose: 399 mg/dL — ABNORMAL HIGH (ref 65–99)
Potassium: 5.1 mmol/L (ref 3.5–5.2)
Sodium: 138 mmol/L (ref 134–144)
TOTAL PROTEIN: 6.4 g/dL (ref 6.0–8.5)

## 2016-10-05 LAB — CULTURE, BLOOD (ROUTINE X 2)
Culture: NO GROWTH
Culture: NO GROWTH
Special Requests: ADEQUATE

## 2016-10-06 ENCOUNTER — Encounter (HOSPITAL_COMMUNITY): Payer: Self-pay | Admitting: *Deleted

## 2016-10-06 DIAGNOSIS — M545 Low back pain: Secondary | ICD-10-CM | POA: Diagnosis not present

## 2016-10-06 DIAGNOSIS — G8929 Other chronic pain: Secondary | ICD-10-CM | POA: Diagnosis present

## 2016-10-06 DIAGNOSIS — M797 Fibromyalgia: Secondary | ICD-10-CM | POA: Diagnosis not present

## 2016-10-06 DIAGNOSIS — C250 Malignant neoplasm of head of pancreas: Secondary | ICD-10-CM | POA: Diagnosis not present

## 2016-10-06 DIAGNOSIS — M1990 Unspecified osteoarthritis, unspecified site: Secondary | ICD-10-CM | POA: Diagnosis present

## 2016-10-06 DIAGNOSIS — G629 Polyneuropathy, unspecified: Secondary | ICD-10-CM | POA: Diagnosis not present

## 2016-10-06 DIAGNOSIS — I129 Hypertensive chronic kidney disease with stage 1 through stage 4 chronic kidney disease, or unspecified chronic kidney disease: Secondary | ICD-10-CM | POA: Diagnosis present

## 2016-10-06 DIAGNOSIS — K219 Gastro-esophageal reflux disease without esophagitis: Secondary | ICD-10-CM | POA: Diagnosis not present

## 2016-10-06 DIAGNOSIS — K8021 Calculus of gallbladder without cholecystitis with obstruction: Secondary | ICD-10-CM | POA: Diagnosis present

## 2016-10-06 DIAGNOSIS — K5710 Diverticulosis of small intestine without perforation or abscess without bleeding: Secondary | ICD-10-CM | POA: Diagnosis not present

## 2016-10-06 DIAGNOSIS — I10 Essential (primary) hypertension: Secondary | ICD-10-CM | POA: Diagnosis not present

## 2016-10-06 DIAGNOSIS — Z79899 Other long term (current) drug therapy: Secondary | ICD-10-CM

## 2016-10-06 DIAGNOSIS — J302 Other seasonal allergic rhinitis: Secondary | ICD-10-CM | POA: Diagnosis present

## 2016-10-06 DIAGNOSIS — E1142 Type 2 diabetes mellitus with diabetic polyneuropathy: Secondary | ICD-10-CM | POA: Diagnosis not present

## 2016-10-06 DIAGNOSIS — E871 Hypo-osmolality and hyponatremia: Secondary | ICD-10-CM | POA: Diagnosis not present

## 2016-10-06 DIAGNOSIS — Z85828 Personal history of other malignant neoplasm of skin: Secondary | ICD-10-CM

## 2016-10-06 DIAGNOSIS — Z79891 Long term (current) use of opiate analgesic: Secondary | ICD-10-CM

## 2016-10-06 DIAGNOSIS — K598 Other specified functional intestinal disorders: Secondary | ICD-10-CM | POA: Diagnosis not present

## 2016-10-06 DIAGNOSIS — N183 Chronic kidney disease, stage 3 (moderate): Secondary | ICD-10-CM | POA: Diagnosis present

## 2016-10-06 DIAGNOSIS — E860 Dehydration: Secondary | ICD-10-CM | POA: Diagnosis present

## 2016-10-06 DIAGNOSIS — Z794 Long term (current) use of insulin: Secondary | ICD-10-CM

## 2016-10-06 DIAGNOSIS — R109 Unspecified abdominal pain: Secondary | ICD-10-CM | POA: Diagnosis not present

## 2016-10-06 DIAGNOSIS — Z981 Arthrodesis status: Secondary | ICD-10-CM | POA: Diagnosis not present

## 2016-10-06 DIAGNOSIS — C259 Malignant neoplasm of pancreas, unspecified: Secondary | ICD-10-CM | POA: Diagnosis not present

## 2016-10-06 DIAGNOSIS — R1013 Epigastric pain: Secondary | ICD-10-CM | POA: Diagnosis not present

## 2016-10-06 DIAGNOSIS — E1122 Type 2 diabetes mellitus with diabetic chronic kidney disease: Secondary | ICD-10-CM | POA: Diagnosis not present

## 2016-10-06 DIAGNOSIS — Z90710 Acquired absence of both cervix and uterus: Secondary | ICD-10-CM

## 2016-10-06 DIAGNOSIS — Z96652 Presence of left artificial knee joint: Secondary | ICD-10-CM | POA: Diagnosis not present

## 2016-10-06 DIAGNOSIS — K830 Cholangitis: Secondary | ICD-10-CM | POA: Diagnosis present

## 2016-10-06 DIAGNOSIS — F419 Anxiety disorder, unspecified: Secondary | ICD-10-CM | POA: Diagnosis present

## 2016-10-06 DIAGNOSIS — F329 Major depressive disorder, single episode, unspecified: Secondary | ICD-10-CM | POA: Diagnosis present

## 2016-10-06 DIAGNOSIS — R1084 Generalized abdominal pain: Secondary | ICD-10-CM | POA: Diagnosis not present

## 2016-10-06 DIAGNOSIS — K831 Obstruction of bile duct: Secondary | ICD-10-CM | POA: Diagnosis not present

## 2016-10-06 DIAGNOSIS — Z6834 Body mass index (BMI) 34.0-34.9, adult: Secondary | ICD-10-CM

## 2016-10-06 DIAGNOSIS — K869 Disease of pancreas, unspecified: Secondary | ICD-10-CM | POA: Diagnosis not present

## 2016-10-06 DIAGNOSIS — Z6832 Body mass index (BMI) 32.0-32.9, adult: Secondary | ICD-10-CM

## 2016-10-07 ENCOUNTER — Inpatient Hospital Stay (HOSPITAL_COMMUNITY)
Admission: EM | Admit: 2016-10-07 | Discharge: 2016-10-11 | DRG: 436 | Disposition: A | Discharge status: Home / Self Care | Payer: Medicare Other | Attending: Family Medicine | Admitting: Family Medicine

## 2016-10-07 ENCOUNTER — Emergency Department (HOSPITAL_COMMUNITY): Payer: Medicare Other

## 2016-10-07 ENCOUNTER — Encounter (HOSPITAL_COMMUNITY): Payer: Self-pay | Admitting: Emergency Medicine

## 2016-10-07 DIAGNOSIS — K831 Obstruction of bile duct: Secondary | ICD-10-CM

## 2016-10-07 DIAGNOSIS — R109 Unspecified abdominal pain: Secondary | ICD-10-CM

## 2016-10-07 DIAGNOSIS — N183 Chronic kidney disease, stage 3 unspecified: Secondary | ICD-10-CM | POA: Diagnosis present

## 2016-10-07 DIAGNOSIS — I10 Essential (primary) hypertension: Secondary | ICD-10-CM | POA: Diagnosis present

## 2016-10-07 DIAGNOSIS — R1013 Epigastric pain: Secondary | ICD-10-CM | POA: Diagnosis not present

## 2016-10-07 DIAGNOSIS — K8689 Other specified diseases of pancreas: Secondary | ICD-10-CM

## 2016-10-07 DIAGNOSIS — C259 Malignant neoplasm of pancreas, unspecified: Secondary | ICD-10-CM

## 2016-10-07 DIAGNOSIS — K869 Disease of pancreas, unspecified: Secondary | ICD-10-CM | POA: Diagnosis not present

## 2016-10-07 DIAGNOSIS — E871 Hypo-osmolality and hyponatremia: Secondary | ICD-10-CM | POA: Diagnosis present

## 2016-10-07 DIAGNOSIS — C250 Malignant neoplasm of head of pancreas: Secondary | ICD-10-CM | POA: Diagnosis not present

## 2016-10-07 DIAGNOSIS — E119 Type 2 diabetes mellitus without complications: Secondary | ICD-10-CM

## 2016-10-07 DIAGNOSIS — K219 Gastro-esophageal reflux disease without esophagitis: Secondary | ICD-10-CM | POA: Diagnosis present

## 2016-10-07 HISTORY — DX: Chronic kidney disease, stage 3 (moderate): N18.3

## 2016-10-07 HISTORY — DX: Chronic kidney disease, stage 3 unspecified: N18.30

## 2016-10-07 LAB — URINALYSIS, ROUTINE W REFLEX MICROSCOPIC
BILIRUBIN URINE: NEGATIVE
Glucose, UA: >=500 mg/dL — AB
Hgb urine dipstick: NEGATIVE
Ketones, ur: NEGATIVE mg/dL
Leukocytes, UA: NEGATIVE
NITRITE: NEGATIVE
PH: 5 (ref 5.0–8.0)
Protein, ur: 100 mg/dL — AB
SPECIFIC GRAVITY, URINE: 1.024 (ref 1.005–1.030)

## 2016-10-07 LAB — CBC
HCT: 35.3 % — ABNORMAL LOW (ref 36.0–46.0)
HEMOGLOBIN: 12 g/dL (ref 12.0–15.0)
MCH: 31.7 pg (ref 26.0–34.0)
MCHC: 34 g/dL (ref 30.0–36.0)
MCV: 93.4 fL (ref 78.0–100.0)
Platelets: 373 10*3/uL (ref 150–400)
RBC: 3.78 MIL/uL — AB (ref 3.87–5.11)
RDW: 13.3 % (ref 11.5–15.5)
WBC: 17.6 10*3/uL — ABNORMAL HIGH (ref 4.0–10.5)

## 2016-10-07 LAB — COMPREHENSIVE METABOLIC PANEL
ALT: 32 U/L (ref 14–54)
ANION GAP: 10 (ref 5–15)
AST: 43 U/L — ABNORMAL HIGH (ref 15–41)
Albumin: 3.1 g/dL — ABNORMAL LOW (ref 3.5–5.0)
Alkaline Phosphatase: 333 U/L — ABNORMAL HIGH (ref 38–126)
BUN: 16 mg/dL (ref 6–20)
CHLORIDE: 94 mmol/L — AB (ref 101–111)
CO2: 25 mmol/L (ref 22–32)
Calcium: 9.7 mg/dL (ref 8.9–10.3)
Creatinine, Ser: 1.17 mg/dL — ABNORMAL HIGH (ref 0.44–1.00)
GFR, EST AFRICAN AMERICAN: 53 mL/min — AB (ref >60)
GFR, EST NON AFRICAN AMERICAN: 45 mL/min — AB (ref >60)
Glucose, Bld: 412 mg/dL — ABNORMAL HIGH (ref 65–99)
POTASSIUM: 4.2 mmol/L (ref 3.5–5.1)
SODIUM: 129 mmol/L — AB (ref 135–145)
Total Bilirubin: 4.3 mg/dL — ABNORMAL HIGH (ref 0.3–1.2)
Total Protein: 7.7 g/dL (ref 6.5–8.1)

## 2016-10-07 LAB — LIPASE, BLOOD: LIPASE: 78 U/L — AB (ref 11–51)

## 2016-10-07 LAB — GLUCOSE, CAPILLARY
GLUCOSE-CAPILLARY: 169 mg/dL — AB (ref 65–99)
Glucose-Capillary: 165 mg/dL — ABNORMAL HIGH (ref 65–99)

## 2016-10-07 MED ORDER — ACETAMINOPHEN 650 MG RE SUPP: 650.0000 mg | Freq: Four times a day (QID) | RECTAL | Status: DC | PRN

## 2016-10-07 MED ORDER — ONDANSETRON 4 MG PO TBDP
4.0000 mg | ORAL_TABLET | Freq: Once | ORAL | Status: AC | PRN
  Administered 2016-10-07: 4 mg via ORAL
  Filled 2016-10-07: qty 1

## 2016-10-07 MED ORDER — GABAPENTIN 300 MG PO CAPS
300.0000 mg | ORAL_CAPSULE | Freq: Every day | ORAL | Status: DC
  Administered 2016-10-07 – 2016-10-10 (×4): 300 mg via ORAL
  Filled 2016-10-07 (×4): qty 1

## 2016-10-07 MED ORDER — SODIUM CHLORIDE 0.9 % IV BOLUS (SEPSIS)
1000.0000 mL | Freq: Once | INTRAVENOUS | Status: AC
Start: 2016-10-07 — End: 2016-10-07
  Administered 2016-10-07: 1000 mL via INTRAVENOUS

## 2016-10-07 MED ORDER — IOPAMIDOL (ISOVUE-300) INJECTION 61%
100.0000 mL | Freq: Once | INTRAVENOUS | Status: AC | PRN
  Administered 2016-10-07: 80 mL via INTRAVENOUS

## 2016-10-07 MED ORDER — INSULIN ASPART 100 UNIT/ML ~~LOC~~ SOLN
0.0000 [IU] | Freq: Three times a day (TID) | SUBCUTANEOUS | Status: DC
  Administered 2016-10-07 (×2): 3 [IU] via SUBCUTANEOUS
  Administered 2016-10-08: 8 [IU] via SUBCUTANEOUS
  Administered 2016-10-08 – 2016-10-10 (×2): 3 [IU] via SUBCUTANEOUS

## 2016-10-07 MED ORDER — ORAL CARE MOUTH RINSE
15.0000 mL | Freq: Two times a day (BID) | OROMUCOSAL | Status: DC
  Administered 2016-10-07 – 2016-10-11 (×6): 15 mL via OROMUCOSAL

## 2016-10-07 MED ORDER — DULOXETINE HCL 30 MG PO CPEP
60.0000 mg | ORAL_CAPSULE | Freq: Every day | ORAL | Status: DC
  Administered 2016-10-07 – 2016-10-11 (×4): 60 mg via ORAL
  Filled 2016-10-07 (×4): qty 2

## 2016-10-07 MED ORDER — AMLODIPINE BESYLATE 5 MG PO TABS
5.0000 mg | ORAL_TABLET | Freq: Every day | ORAL | Status: DC
  Administered 2016-10-07 – 2016-10-10 (×4): 5 mg via ORAL
  Filled 2016-10-07 (×4): qty 1

## 2016-10-07 MED ORDER — METRONIDAZOLE IN NACL 5-0.79 MG/ML-% IV SOLN
500.0000 mg | Freq: Once | INTRAVENOUS | Status: AC
  Administered 2016-10-07: 500 mg via INTRAVENOUS
  Filled 2016-10-07: qty 100

## 2016-10-07 MED ORDER — HYDROCODONE-ACETAMINOPHEN 7.5-325 MG PO TABS
1.0000 | ORAL_TABLET | Freq: Four times a day (QID) | ORAL | Status: DC | PRN
  Administered 2016-10-07 – 2016-10-11 (×7): 1 via ORAL
  Filled 2016-10-07 (×7): qty 1

## 2016-10-07 MED ORDER — ACETAMINOPHEN 325 MG PO TABS
650.0000 mg | ORAL_TABLET | Freq: Four times a day (QID) | ORAL | Status: DC | PRN
  Administered 2016-10-09: 650 mg via ORAL
  Filled 2016-10-07: qty 2

## 2016-10-07 MED ORDER — CIPROFLOXACIN IN D5W 400 MG/200ML IV SOLN
400.0000 mg | Freq: Once | INTRAVENOUS | Status: AC
  Administered 2016-10-07: 400 mg via INTRAVENOUS
  Filled 2016-10-07: qty 200

## 2016-10-07 MED ORDER — IOPAMIDOL (ISOVUE-300) INJECTION 61%
INTRAVENOUS | Status: AC
  Filled 2016-10-07: qty 100

## 2016-10-07 MED ORDER — COLESTIPOL HCL 5 G PO PACK
5.0000 g | PACK | ORAL | Status: DC
  Administered 2016-10-07 – 2016-10-08 (×3): 5 g via ORAL
  Filled 2016-10-07 (×8): qty 1

## 2016-10-07 MED ORDER — MORPHINE SULFATE (PF) 4 MG/ML IV SOLN
1.0000 mg | INTRAVENOUS | Status: DC | PRN
  Administered 2016-10-09: 1 mg via INTRAVENOUS
  Filled 2016-10-07: qty 1

## 2016-10-07 MED ORDER — INSULIN GLARGINE 100 UNIT/ML ~~LOC~~ SOLN
40.0000 [IU] | Freq: Every day | SUBCUTANEOUS | Status: DC
  Administered 2016-10-07 – 2016-10-10 (×3): 40 [IU] via SUBCUTANEOUS
  Filled 2016-10-07 (×5): qty 0.4

## 2016-10-07 MED ORDER — ONDANSETRON HCL 4 MG/2ML IJ SOLN
4.0000 mg | Freq: Four times a day (QID) | INTRAMUSCULAR | Status: DC | PRN
  Administered 2016-10-07 (×2): 4 mg via INTRAVENOUS
  Filled 2016-10-07 (×2): qty 2

## 2016-10-07 MED ORDER — SODIUM CHLORIDE 0.9 % IV BOLUS (SEPSIS)
500.0000 mL | Freq: Once | INTRAVENOUS | Status: AC
  Administered 2016-10-07: 500 mL via INTRAVENOUS

## 2016-10-07 MED ORDER — PANTOPRAZOLE SODIUM 40 MG IV SOLR
40.0000 mg | Freq: Two times a day (BID) | INTRAVENOUS | Status: DC
  Administered 2016-10-07 – 2016-10-11 (×8): 40 mg via INTRAVENOUS
  Filled 2016-10-07 (×9): qty 40

## 2016-10-07 MED ORDER — HEPARIN SODIUM (PORCINE) 5000 UNIT/ML IJ SOLN
5000.0000 [IU] | Freq: Three times a day (TID) | INTRAMUSCULAR | Status: DC
  Administered 2016-10-07 – 2016-10-08 (×2): 5000 [IU] via SUBCUTANEOUS
  Filled 2016-10-07 (×2): qty 1

## 2016-10-07 MED ORDER — FENTANYL CITRATE (PF) 100 MCG/2ML IJ SOLN
50.0000 ug | Freq: Once | INTRAMUSCULAR | Status: AC
  Administered 2016-10-07: 50 ug via INTRAVENOUS
  Filled 2016-10-07: qty 2

## 2016-10-07 MED ORDER — SODIUM CHLORIDE 0.9 % IV SOLN
INTRAVENOUS | Status: DC
  Administered 2016-10-07 – 2016-10-11 (×9): via INTRAVENOUS

## 2016-10-07 MED ORDER — CARVEDILOL 25 MG PO TABS
25.0000 mg | ORAL_TABLET | Freq: Two times a day (BID) | ORAL | Status: DC
  Administered 2016-10-07 – 2016-10-11 (×7): 25 mg via ORAL
  Filled 2016-10-07 (×7): qty 1

## 2016-10-07 MED ORDER — ONDANSETRON HCL 4 MG/2ML IJ SOLN
4.0000 mg | Freq: Once | INTRAMUSCULAR | Status: AC
  Administered 2016-10-07: 4 mg via INTRAVENOUS
  Filled 2016-10-07: qty 2

## 2016-10-07 MED ORDER — ONDANSETRON HCL 4 MG PO TABS: 4.0000 mg | ORAL_TABLET | Freq: Four times a day (QID) | ORAL | Status: DC | PRN

## 2016-10-07 MED ORDER — PIPERACILLIN-TAZOBACTAM 3.375 G IVPB
3.3750 g | Freq: Three times a day (TID) | INTRAVENOUS | Status: DC
  Administered 2016-10-07 – 2016-10-11 (×12): 3.375 g via INTRAVENOUS
  Filled 2016-10-07 (×14): qty 50

## 2016-10-07 MED ORDER — HYDRALAZINE HCL 20 MG/ML IJ SOLN: 10.0000 mg | Freq: Four times a day (QID) | INTRAMUSCULAR | Status: DC | PRN

## 2016-10-07 NOTE — ED Provider Notes (Signed)
[facility]-EMERGENCY DEPT Provider Note   CSN: [id number] Arrival date & time: 10/06/16  2352  By signing my name below, I, Patricia Austin, attest that this documentation has been prepared under the direction and in the presence of Macky Galik, MD.  Electronically Signed: Octavia Austin, ED Scribe. 10/07/16. 4:01 AM.    History   Chief Complaint Chief Complaint  Patient presents with  . Abdominal Pain    The history is provided by the patient and the spouse. No language interpreter was used.  Abdominal Pain   This is a recurrent problem. The current episode started 2 days ago. The problem occurs constantly. The problem has been gradually improving. The pain is associated with eating. The pain is located in the generalized abdominal region. Associated symptoms include nausea. Pertinent negatives include anorexia, fever, belching, diarrhea, flatus, hematochezia, melena, vomiting and constipation. Nothing aggravates the symptoms. Nothing relieves the symptoms. Past workup includes GI consult and surgery. Past workup does not include CT scan, ultrasound or barium enema. Her past medical history does not include PUD or GERD.   HPI Comments: Patricia Austin is a 71 y.o. female who presents to the Emergency Department complaining of moderate, progressively worsening, generalized abdominal pain x one week. She reports associated loss of appetite, nausea, Per husband, had an esophagogastroduodenoscopy performed on 09/26/16. She had an IR Int Ext Biliary Drain with Choleangiogram placed which states had been draining black fluid and is now currently green. Pt notes having a drain placed due to having a pancreatic mass. While in the hospital, she had an infection and was prescribed Augmentin which she is still taking. Pt is able to tolerate liquids PO. She has another procedure in 2 days. Pt has not informed her surgeon or GI specialist about this issue. She denies diarrhea   Past Medical History:  Diagnosis  Date  . Anxiety    "not all the time"   . Chronic lower back pain   . Complication of anesthesia 2002   difficulty /w intubation, with plastic surgery , because she was feeling so anxious. 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 .   Pt. reports that she  is very sensitive to med. & is hard  to wake up.  . Depression    "not all the time"   . Difficult intubation 2002   with head surgery 2002 has done ok with later intubations  . Dysrhythmia    told that she sometimes has extra beats. Does not see cardiologist, had a stress test many yrs. ago>10 yrs.   . Esophageal dysmotility    "getting worse recenly" (12/15/2013)  . Fibromyalgia   . Hypertension   . Nephritis ~ 1955  . Osteoarthritis    knees   . Pancreatic mass   . Seasonal allergies    "in the spring"  . Shortness of breath    walking long distances   . squamous cell carcinoma scalp    squamous, on scalp, & has had several surgeries on scalp, for plastics repair of scalp   . Type II diabetes mellitus [facility])     Patient Active Problem List   Diagnosis Date Noted  . Biliary obstruction 09/29/2016  . Pancreatic mass   . Multiple duodenal ulcers   . Lumbar adjacent segment disease with spondylolisthesis 08/20/2016  . Peripheral edema 07/18/2016  . CKD (chronic kidney disease) stage 3, GFR 30-59 ml/min 09/21/2015  . Fibromyalgia 09/20/2015  . DJD (degenerative joint disease) of knee 12/14/2013  . Diabetes (HCC)  02/09/2013  . Morbid obesity ([place]) 02/09/2013  . Hypertension 02/09/2013  . Hyperlipidemia with target LDL less than 100 02/09/2013  . Osteoarthritis of both feet 02/09/2013  . Osteoarthritis of both knees 02/09/2013  . GERD (gastroesophageal reflux disease) 02/09/2013  . Allergic rhinitis 02/09/2013  . 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 ABDOMINAL HYSTERECTOMY     complete  . APPENDECTOMY    . BACK SURGERY  07/2016   L 4 and L 5 fusion  done [facility]   . CARPAL TUNNEL RELEASE Right   . CARPAL TUNNEL RELEASE Left   . COLONOSCOPY    . CYST EXCISION  1990's?   scalp  . ESOPHAGOGASTRODUODENOSCOPY N/A 09/26/2016   Procedure: ESOPHAGOGASTRODUODENOSCOPY (EGD);  Surgeon: Gatha Mayer, MD;  Location: [facility] ENDOSCOPY;  Service: Endoscopy;  Laterality: N/A;  . IR INT EXT BILIARY DRAIN WITH CHOLANGIOGRAM  09/29/2016   2 gallstones  . LUMBAR DISC SURGERY  ~ 2012 X 2  . SKIN GRAFT  2002   scalp; "went to [facility] to repair OR earlier in the yearr"  . SQUAMOUS CELL CARCINOMA EXCISION  2002   "off scalp; cancerous area removed and stapled; Dr. Georgia Lopes"  . TONSILLECTOMY  age 48  . TOTAL KNEE ARTHROPLASTY Left 12/14/2013   Procedure: LEFT TOTAL KNEE ARTHROPLASTY;  Surgeon: Ninetta Lights, MD;  Location: [place];  Service: Orthopedics;  Laterality: Left;  . TOTAL KNEE ARTHROPLASTY Left 12/15/2013    OB History    No data available       Home Medications    Prior to Admission medications   Medication Sig Start Date End Date Taking? Authorizing Provider  amLODipine (NORVASC) 5 MG tablet Take 1 tablet (5 mg total) by mouth daily. Patient taking differently: Take 5 mg by mouth at bedtime.  07/18/16   Mary-Margaret Hassell Done, FNP  amoxicillin-clavulanate (AUGMENTIN) 500-125 MG tablet Take 1 tablet (500 mg total) by mouth 2 (two) times daily. 10/02/16   Verlee Monte, MD  benazepril (LOTENSIN) 40 MG tablet Take 1 tablet (40 mg total) by mouth at bedtime. 10/02/16   Verlee Monte, MD  carvedilol (COREG) 25 MG tablet TAKE  (1)  TABLET TWICE A DAY WITH MEALS (BREAKFAST AND SUPPER) Patient taking differently: Take 25 mg by mouth every evening.  07/18/16   Mary-Margaret Hassell Done, FNP  colestipol (COLESTID) 5 g packet Take 5 g by mouth 2 (two) times daily. 09/26/16   Gatha Mayer, MD  DULoxetine (CYMBALTA) 60 MG capsule Take 1 capsule (60 mg total) by mouth 2 (two) times daily. Patient taking differently: Take 60 mg by mouth at bedtime.  07/18/16   Mary-Margaret  Hassell Done, FNP  furosemide (LASIX) 20 MG tablet Take 1 tablet (20 mg total) by mouth daily. Patient taking differently: Take 20 mg by mouth 4 (four) times a week.  07/18/16   Mary-Margaret Hassell Done, FNP  gabapentin (NEURONTIN) 300 MG capsule TAKE (1) CAPSULE TWICE DAILY. Patient taking differently: Take 300 mg by mouth at bedtime.  07/18/16   Mary-Margaret Hassell Done, FNP  glipiZIDE-metformin (METAGLIP) 5-500 MG tablet Take 2 tablets by mouth 2 (two) times daily before a meal. Patient taking differently: Take 2 tablets by mouth at bedtime.  07/18/16   Mary-Margaret Hassell Done, FNP  glucose blood (ONE TOUCH ULTRA TEST) test strip Test BID and prn  E11.42 01/30/15   Mary-Margaret Hassell Done, FNP  HYDROcodone-acetaminophen (NORCO) 7.5-325 MG tablet Take 1 tablet by mouth every 6 (six)  hours as needed for moderate pain. 08/21/16   Ashok Pall, MD  Insulin Glargine (TOUJEO SOLOSTAR) 300 UNIT/ML SOPN Inject 60 Units into the skin daily. Patient taking differently: Inject 40-50 Units into the skin at bedtime.  08/15/16   Mary-Margaret Hassell Done, FNP  Insulin Pen Needle (INSUPEN PEN NEEDLES) 32G X 4 MM MISC 1 each by Does not apply route daily. 01/30/15   Mary-Margaret Hassell Done, FNP  Lancet Devices (ONE TOUCH DELICA LANCING DEV) MISC 1 each by Does not apply route 2 (two) times daily. Dx   E11.42 01/30/15   Mary-Margaret Hassell Done, FNP  oxyCODONE-acetaminophen (ROXICET) 5-325 MG tablet Take 1-2 tablets by mouth every 4 (four) hours as needed. Patient taking differently: Take 1-2 tablets by mouth every 4 (four) hours as needed (pain.).  05/20/16   Mary-Margaret Hassell Done, FNP  pantoprazole (PROTONIX) 40 MG tablet Take 1 tablet (40 mg total) by mouth daily before breakfast. 09/26/16   Gatha Mayer, MD    Family History Family History  Problem Relation Age of Onset  . Early death Mother   . Thrombosis Father     Social History Social History  Substance Use Topics  . Smoking status: Never Smoker  . Smokeless tobacco: Never Used  .  Alcohol use No     Allergies   No known allergies   Review of Systems Review of Systems  Constitutional: Negative for fever.  Gastrointestinal: Positive for abdominal pain and nausea. Negative for anorexia, constipation, diarrhea, flatus, hematochezia, melena and vomiting.  Genitourinary: Negative for flank pain.  All other systems reviewed and are negative.    Physical Exam Updated Vital Signs BP (!) 183/82 (BP Location: Left Arm)   Pulse (!) 106   Temp 98.1 F (36.7 C) (Oral)   Resp 16   Ht '5\' 3"'$  (1.6 m)   Wt 181 lb (82.1 kg)   SpO2 98%   BMI 32.06 kg/m   Physical Exam  Constitutional: She is oriented to person, place, and time. She appears well-developed and well-nourished.  HENT:  Head: Normocephalic and atraumatic.  Mouth/Throat: Oropharynx is clear and moist. No oropharyngeal exudate.  Moist mucous membranes. No exudates.   Eyes: Conjunctivae and EOM are normal. Pupils are equal, round, and reactive to light.  Neck: Normal range of motion. Neck supple. No JVD present. No tracheal deviation present.  No carotid bruits. Trachea midline.   Cardiovascular: Normal rate, regular rhythm, normal heart sounds and intact distal pulses.  Exam reveals no gallop and no friction rub.   No murmur heard. RRR.   Pulmonary/Chest: Effort normal and breath sounds normal. No stridor. No respiratory distress. She has no wheezes. She has no rales.  Lungs CTA bilaterally.   Abdominal: Soft. Bowel sounds are normal. She exhibits no distension and no mass. There is no tenderness. There is no rebound and no guarding.  Musculoskeletal: Normal range of motion. She exhibits no edema, tenderness or deformity.  Lymphadenopathy:    She has no cervical adenopathy.  Neurological: She is alert and oriented to person, place, and time. She has normal reflexes. She displays normal reflexes.  Skin: Skin is warm and dry. Capillary refill takes less than 2 seconds.  Psychiatric: She has a normal mood  and affect.  Nursing note and vitals reviewed.    ED Treatments / Results   Vitals:   10/07/16 0403 10/07/16 0404  BP: (!) 162/75 (!) 162/75  Pulse: (!) 107 (!) 108  Resp: 18   Temp:     Results for  orders placed or performed during the hospital encounter of 10/07/16  Lipase, blood  Result Value Ref Range   Lipase 78 (H) 11 - 51 U/L  Comprehensive metabolic panel  Result Value Ref Range   Sodium 129 (L) 135 - 145 mmol/L   Potassium 4.2 3.5 - 5.1 mmol/L   Chloride 94 (L) 101 - 111 mmol/L   CO2 25 22 - 32 mmol/L   Glucose, Bld 412 (H) 65 - 99 mg/dL   BUN 16 6 - 20 mg/dL   Creatinine, Ser 7.65 (H) 0.44 - 1.00 mg/dL   Calcium 9.7 8.9 - 02.2 mg/dL   Total Protein 7.7 6.5 - 8.1 g/dL   Albumin 3.1 (L) 3.5 - 5.0 g/dL   AST 43 (H) 15 - 41 U/L   ALT 32 14 - 54 U/L   Alkaline Phosphatase 333 (H) 38 - 126 U/L   Total Bilirubin 4.3 (H) 0.3 - 1.2 mg/dL   GFR calc non Af Amer 45 (L) >60 mL/min   GFR calc Af Amer 53 (L) >60 mL/min   Anion gap 10 5 - 15  CBC  Result Value Ref Range   WBC 17.6 (H) 4.0 - 10.5 K/uL   RBC 3.78 (L) 3.87 - 5.11 MIL/uL   Hemoglobin 12.0 12.0 - 15.0 g/dL   HCT 86.1 (L) 43.0 - 15.6 %   MCV 93.4 78.0 - 100.0 fL   MCH 31.7 26.0 - 34.0 pg   MCHC 34.0 30.0 - 36.0 g/dL   RDW 34.9 38.3 - 06.7 %   Platelets 373 150 - 400 K/uL  Urinalysis, Routine w reflex microscopic  Result Value Ref Range   Color, Urine AMBER (A) YELLOW   APPearance CLEAR CLEAR   Specific Gravity, Urine 1.024 1.005 - 1.030   pH 5.0 5.0 - 8.0   Glucose, UA >=500 (A) NEGATIVE mg/dL   Hgb urine dipstick NEGATIVE NEGATIVE   Bilirubin Urine NEGATIVE NEGATIVE   Ketones, ur NEGATIVE NEGATIVE mg/dL   Protein, ur 882 (A) NEGATIVE mg/dL   Nitrite NEGATIVE NEGATIVE   Leukocytes, UA NEGATIVE NEGATIVE   RBC / HPF 0-5 0 - 5 RBC/hpf   WBC, UA 0-5 0 - 5 WBC/hpf   Bacteria, UA RARE (A) NONE SEEN   Squamous Epithelial / LPF 0-5 (A) NONE SEEN   Ct Head Wo Contrast  Result Date: 09/29/2016 CLINICAL  DATA:  Altered mental status EXAM: CT HEAD WITHOUT CONTRAST TECHNIQUE: Contiguous axial images were obtained from the base of the skull through the vertex without intravenous contrast. COMPARISON:  Head CT 09/20/2016 FINDINGS: Despite efforts by the technologist and patient, motion artifact is present on today's examination and could not be eliminated. The findings of this study are interpreted in the context of that limitation. Brain: Within the above limitation, there is no intracranial hemorrhage or mass effect identified. Skull: Normal visualized skull base, calvarium and extracranial soft tissues. Sinuses/Orbits: No sinus fluid levels or advanced mucosal thickening. No mastoid effusion. Normal orbits. IMPRESSION: 1. Severely motion degraded examination. 2. Within that limitation, no acute intracranial abnormality is visualized. Electronically Signed   By: Deatra Robinson M.D.   On: 09/29/2016 23:54   Ct Head Wo Contrast  Result Date: 09/20/2016 CLINICAL DATA:  Weakness. Left-sided numbness and tingling for 3 days. History of diabetes and hypertension. EXAM: CT HEAD WITHOUT CONTRAST TECHNIQUE: Contiguous axial images were obtained from the base of the skull through the vertex without intravenous contrast. COMPARISON:  06/02/2013 FINDINGS: Brain: There is no evidence of  acute cortical infarct, intracranial hemorrhage, mass, midline shift, or extra-axial fluid collection. Mild cerebral atrophy may have minimally progressed. Periventricular white matter hypodensities also appear slightly increased and are nonspecific but compatible with mild chronic small vessel ischemic disease. Vascular: Mild calcified atherosclerosis at the skullbase. No hyperdense vessel. Skull: No fracture or focal osseous lesion. Sinuses/Orbits: Visualized paranasal sinuses are clear. Minimal chronic air cell opacification at the mastoid tips. Unremarkable orbits. Other: None. IMPRESSION: 1. No evidence of acute intracranial abnormality. 2.  Mild chronic small vessel ischemic disease. Electronically Signed   By: Logan Bores M.D.   On: 09/20/2016 13:48   Ct Abdomen Pelvis W Contrast  Result Date: 10/07/2016 CLINICAL DATA:  Worsening generalized abdominal pain for 1 week. History of pancreatic mass post cholangiogram with biliary drainage placement. Draining green fluid. White cell count 17.6. Elevated lipase. History of hypertension and diabetes. EXAM: CT ABDOMEN AND PELVIS WITH CONTRAST TECHNIQUE: Multidetector CT imaging of the abdomen and pelvis was performed using the standard protocol following bolus administration of intravenous contrast. CONTRAST:  80 mL Isovue-300 COMPARISON:  09/20/2016 FINDINGS: Lower chest: Calcified granulomas in the left lung base. Nodule in the right lung base measuring 5 mm diameter, unchanged. Distal esophageal wall is thickened possibly indicating reflux or esophagitis. Hepatobiliary: Scattered calcified granulomas in the liver. Mild intrahepatic bile duct dilatation. Biliary drainage catheter is present, injuring through the left lobe of the liver. Distal catheter is demonstrated in the lower common bile duct. Cholelithiasis with a large stone in the gallbladder. Small amount of gas in the gallbladder is likely related to catheter insertion. Pancreas: There is a mass in the head and uncinate process of the pancreas measuring up to 4 x 4.2 cm diameter. Central necrosis is present. The mass is causing obstruction of the distal common bile duct. Mass is contiguous with the duodenum and erect duodenal invasion is not excluded. The mass abuts the superior mesenteric artery and vein without circumferential involvement. No pancreatic ductal dilatation. Spleen: Calcified granulomas in the spleen. Adrenals/Urinary Tract: No adrenal gland nodules. Renal nephrograms are homogeneous and symmetrical. Small subcentimeter cysts. No hydronephrosis or hydroureter. Bladder wall is not thickened. Stomach/Bowel: Stomach and small  bowel are decompressed. Scattered gas and stool in the colon. No colonic distention or inflammation. Scattered colonic diverticula. Appendix is not identified. Vascular/Lymphatic: Aortic atherosclerosis. No enlarged abdominal or pelvic lymph nodes. Reproductive: Status post hysterectomy. No adnexal masses. Other: No free air or free fluid in the abdomen. Abdominal wall musculature appears intact. Musculoskeletal: Degenerative changes in the lumbar spine. Postoperative changes with laminectomies, posterior rod and screw fixation, and intervertebral disc prostheses from L3 through L5. IMPRESSION: Mass in the head and uncinate process of the pancreas is again demonstrated, with associated obstruction of the distal common bile duct. Biliary drainage catheter is in place with tip in the distal common bile duct. Mild residual intrahepatic bile duct dilatation. Cholelithiasis. Calcified granulomas. Right lower lung nodules measuring up to 5 mm without change. Esophageal wall thickening possibly due to reflux or esophagitis. Electronically Signed   By: Lucienne Capers M.D.   On: 10/07/2016 06:32   Ct Abdomen Pelvis W Contrast  Result Date: 09/20/2016 CLINICAL DATA:  Transaminitis. Dysuria and lower abdominal pain. Lumbar spine surgery 1 month ago. EXAM: CT ABDOMEN AND PELVIS WITH CONTRAST TECHNIQUE: Multidetector CT imaging of the abdomen and pelvis was performed using the standard protocol following bolus administration of intravenous contrast. CONTRAST:  80 mL Isovue 300 COMPARISON:  None. FINDINGS: Lower chest: 5 mm  right lower lobe lung nodule (series 4, image 12). 3 mm right lower lobe nodule (series 4, image 14). 5 mm calcified nodule in the left lower lobe. No lung consolidation or pleural effusion. Hepatobiliary: Diffusely decreased attenuation of the liver suggesting steatosis. Scattered calcified granulomas in the liver. Mild intrahepatic biliary dilatation. Common bile duct dilatation measuring 12 mm. 3.4 cm  stone in the gallbladder without evidence of acute cholecystitis. Pancreas: There is an approximately 4.0 x 3.1 x 3.9 cm heterogeneously hypoenhancing mass at the level of the uncinate process of the pancreas. This may contact the posterior margin of the adjacent superior mesenteric artery and partially encases some small adjacent SMA branch vessels. There is not a substantial enough amount of surrounding stranding to suggest that this represents pancreatitis rather than neoplasm. There is pancreatic atrophy without ductal dilatation. Spleen: Calcified granulomas in the spleen. Adrenals/Urinary Tract: Unremarkable adrenal glands. No evidence of renal calculi or hydronephrosis. Subcentimeter low-density renal lesions bilaterally are too small to fully characterize but most likely represent cysts. Unremarkable bladder. Stomach/Bowel: Apparent mild diffuse wall thickening of the visualized distal esophagus. The stomach is within normal limits. Duodenal diverticular noted. No evidence of bowel obstruction. Mild colonic diverticulosis without evidence of diverticulitis. Prior appendectomy. Vascular/Lymphatic: Minimal atherosclerotic plaque in the abdominal aorta without aneurysm. Small portacaval lymph nodes measure up to 7 mm in short axis. Reproductive: Prior hysterectomy.  No adnexal mass. Other: No intraperitoneal free fluid. Musculoskeletal: Postoperative changes from recent L3-L5 PLIF. IMPRESSION: 1. 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 Abdominal MRI is recommended for further evaluation. 2. Associated mild intrahepatic and extrahepatic biliary dilatation. 3. Small right lower lobe lung nodules measuring up to 5 mm, indeterminate. Metastatic disease is not excluded. 4. Cholelithiasis. 5. Hepatic steatosis. 6. Distal esophageal wall thickening, query esophagitis. Electronically Signed   By: Logan Bores M.D.   On: 09/20/2016 21:13   Dg  Chest Port 1 View  Result Date: 09/30/2016 CLINICAL DATA:  Fever EXAM: PORTABLE CHEST 1 VIEW COMPARISON:  Chest radiograph 08/20/2016 FINDINGS: Cardiomediastinal silhouette remains mildly enlarged. There are diffuse peribronchial opacities, likely indicating pulmonary edema. Unchanged left basilar calcified granuloma. No pneumothorax or sizable pleural effusion. IMPRESSION: Shallow lung inflation with mild pulmonary edema. Electronically Signed   By: Ulyses Jarred M.D.   On: 09/30/2016 04:49   Dg C-arm 1-60 Min-no Report  Result Date: 09/26/2016 Fluoroscopy was utilized by the requesting physician.  No radiographic interpretation.   Ir Int Lianne Cure Biliary Drain With Cholangiogram  Result Date: 09/29/2016 INDICATION: 71 year old with pancreatic mass and biliary obstruction. Patient needs a tissue diagnosis and biliary decompression. Scheduled for PTC with biliary drain placement. EXAM: PLACEMENT OF AN INTERNAL/EXTERNAL BILIARY DRAIN WITH ULTRASOUND AND FLUOROSCOPIC GUIDANCE MEDICATIONS: Zosyn 3.375 g; The antibiotic was administered within an appropriate time frame prior to the initiation of the procedure. Hydralazine 10 mg ANESTHESIA/SEDATION: Moderate (conscious) sedation was employed during this procedure. A total of Versed 3.0 mg and Fentanyl 100 mcg was administered intravenously. Moderate Sedation Time: 27 minutes. The patient's level of consciousness and vital signs were monitored continuously by radiology nursing throughout the procedure under my direct supervision. FLUOROSCOPY TIME:  Fluoroscopy Time: 1 minutes and 24 seconds, 59 mGy CONTRAST:  20 mL [id number] COMPLICATIONS: None immediate. PROCEDURE: Informed written consent was obtained from the patient after a thorough discussion of the procedural risks, benefits and alternatives. All questions were addressed. Maximal Sterile Barrier Technique was utilized including caps, mask, sterile  gowns, sterile gloves, sterile drape, hand hygiene and skin  antiseptic. A timeout was performed prior to the initiation of the procedure. Patient was placed supine. The anterior and right side of the abdomen were prepped and draped in a sterile fashion. The liver was evaluated with ultrasound. Mildly dilated left hepatic duct was targeted with ultrasound. The anterior abdomen was anesthetized with 1% lidocaine. [place] needle was directed into this mildly dilated intrahepatic bile duct with ultrasound guidance. A wire advanced easily into the biliary system under fluoroscopic guidance. A micropuncture dilator set was placed and contrast injection confirmed placement in the biliary system. At this point, the patient was very combative and required restraining in order to prevent the patient from coming off the table. The combativeness appear to be secondary to the sedation medicines rather than pain. 5 [place] Kumpe catheter and Glidewire were advanced across the distal biliary obstruction with minimal difficulty. Due to patient's combativeness and confusion, we had no opportunity to perform a brush biopsy. 10 [place] biliary drain was successfully placed over a stiff Amplatz wire. Tip was placed in the duodenum. Small amount of bile was aspirated for culture. Drain was sutured to skin and secured with a Stat Lock. FINDINGS: Mild dilatation of the intrahepatic ducts. Common bile duct is moderately distended. There is an obstruction at the distal common bile duct. Catheter and wire were able to traverse the distal biliary obstruction. 10 French drain was successfully advanced into the duodenum. Unable to perform a brush biopsy due to patient's combative state during the procedure. IMPRESSION: Successful placement of an internal/external biliary drain. Patient will need to be scheduled at a later time for brush biopsy and potential internalization. Alternately, patient may need an EUS for tissue diagnosis. Electronically Signed   By: Markus Daft M.D.   On: 09/29/2016 17:50      DIAGNOSTIC STUDIES: Oxygen Saturation is 98% on RA, normal by my interpretation.  COORDINATION OF CARE:  4:00 AM Discussed treatment plan with pt at bedside and pt agreed to plan.  Labs (all labs ordered are listed, but only abnormal results are displayed) Results for orders placed or performed during the hospital encounter of 10/07/16  Lipase, blood  Result Value Ref Range   Lipase 78 (H) 11 - 51 U/L  Comprehensive metabolic panel  Result Value Ref Range   Sodium 129 (L) 135 - 145 mmol/L   Potassium 4.2 3.5 - 5.1 mmol/L   Chloride 94 (L) 101 - 111 mmol/L   CO2 25 22 - 32 mmol/L   Glucose, Bld 412 (H) 65 - 99 mg/dL   BUN 16 6 - 20 mg/dL   Creatinine, Ser 1.17 (H) 0.44 - 1.00 mg/dL   Calcium 9.7 8.9 - 10.3 mg/dL   Total Protein 7.7 6.5 - 8.1 g/dL   Albumin 3.1 (L) 3.5 - 5.0 g/dL   AST 43 (H) 15 - 41 U/L   ALT 32 14 - 54 U/L   Alkaline Phosphatase 333 (H) 38 - 126 U/L   Total Bilirubin 4.3 (H) 0.3 - 1.2 mg/dL   GFR calc non Af Amer 45 (L) >60 mL/min   GFR calc Af Amer 53 (L) >60 mL/min   Anion gap 10 5 - 15  CBC  Result Value Ref Range   WBC 17.6 (H) 4.0 - 10.5 K/uL   RBC 3.78 (L) 3.87 - 5.11 MIL/uL   Hemoglobin 12.0 12.0 - 15.0 g/dL   HCT 35.3 (L) 36.0 - 46.0 %   MCV  93.4 78.0 - 100.0 fL   MCH 31.7 26.0 - 34.0 pg   MCHC 34.0 30.0 - 36.0 g/dL   RDW 61.4 83.0 - 73.5 %   Platelets 373 150 - 400 K/uL  Urinalysis, Routine w reflex microscopic  Result Value Ref Range   Color, Urine AMBER (A) YELLOW   APPearance CLEAR CLEAR   Specific Gravity, Urine 1.024 1.005 - 1.030   pH 5.0 5.0 - 8.0   Glucose, UA >=500 (A) NEGATIVE mg/dL   Hgb urine dipstick NEGATIVE NEGATIVE   Bilirubin Urine NEGATIVE NEGATIVE   Ketones, ur NEGATIVE NEGATIVE mg/dL   Protein, ur 430 (A) NEGATIVE mg/dL   Nitrite NEGATIVE NEGATIVE   Leukocytes, UA NEGATIVE NEGATIVE   RBC / HPF 0-5 0 - 5 RBC/hpf   WBC, UA 0-5 0 - 5 WBC/hpf   Bacteria, UA RARE (A) NONE SEEN   Squamous Epithelial / LPF 0-5 (A)  NONE SEEN   Ct Head Wo Contrast  Result Date: 09/29/2016 CLINICAL DATA:  Altered mental status EXAM: CT HEAD WITHOUT CONTRAST TECHNIQUE: Contiguous axial images were obtained from the base of the skull through the vertex without intravenous contrast. COMPARISON:  Head CT 09/20/2016 FINDINGS: Despite efforts by the technologist and patient, motion artifact is present on today's examination and could not be eliminated. The findings of this study are interpreted in the context of that limitation. Brain: Within the above limitation, there is no intracranial hemorrhage or mass effect identified. Skull: Normal visualized skull base, calvarium and extracranial soft tissues. Sinuses/Orbits: No sinus fluid levels or advanced mucosal thickening. No mastoid effusion. Normal orbits. IMPRESSION: 1. Severely motion degraded examination. 2. Within that limitation, no acute intracranial abnormality is visualized. Electronically Signed   By: Deatra Robinson M.D.   On: 09/29/2016 23:54   Ct Head Wo Contrast  Result Date: 09/20/2016 CLINICAL DATA:  Weakness. Left-sided numbness and tingling for 3 days. History of diabetes and hypertension. EXAM: CT HEAD WITHOUT CONTRAST TECHNIQUE: Contiguous axial images were obtained from the base of the skull through the vertex without intravenous contrast. COMPARISON:  06/02/2013 FINDINGS: Brain: There is no evidence of acute cortical infarct, intracranial hemorrhage, mass, midline shift, or extra-axial fluid collection. Mild cerebral atrophy may have minimally progressed. Periventricular white matter hypodensities also appear slightly increased and are nonspecific but compatible with mild chronic small vessel ischemic disease. Vascular: Mild calcified atherosclerosis at the skullbase. No hyperdense vessel. Skull: No fracture or focal osseous lesion. Sinuses/Orbits: Visualized paranasal sinuses are clear. Minimal chronic air cell opacification at the mastoid tips. Unremarkable orbits. Other:  None. IMPRESSION: 1. No evidence of acute intracranial abnormality. 2. Mild chronic small vessel ischemic disease. Electronically Signed   By: Sebastian Ache M.D.   On: 09/20/2016 13:48   Ct Abdomen Pelvis W Contrast  Result Date: 10/07/2016 CLINICAL DATA:  Worsening generalized abdominal pain for 1 week. History of pancreatic mass post cholangiogram with biliary drainage placement. Draining green fluid. White cell count 17.6. Elevated lipase. History of hypertension and diabetes. EXAM: CT ABDOMEN AND PELVIS WITH CONTRAST TECHNIQUE: Multidetector CT imaging of the abdomen and pelvis was performed using the standard protocol following bolus administration of intravenous contrast. CONTRAST:  80 mL Isovue-300 COMPARISON:  09/20/2016 FINDINGS: Lower chest: Calcified granulomas in the left lung base. Nodule in the right lung base measuring 5 mm diameter, unchanged. Distal esophageal wall is thickened possibly indicating reflux or esophagitis. Hepatobiliary: Scattered calcified granulomas in the liver. Mild intrahepatic bile duct dilatation. Biliary drainage catheter is present, injuring  through the left lobe of the liver. Distal catheter is demonstrated in the lower common bile duct. Cholelithiasis with a large stone in the gallbladder. Small amount of gas in the gallbladder is likely related to catheter insertion. Pancreas: There is a mass in the head and uncinate process of the pancreas measuring up to 4 x 4.2 cm diameter. Central necrosis is present. The mass is causing obstruction of the distal common bile duct. Mass is contiguous with the duodenum and erect duodenal invasion is not excluded. The mass abuts the superior mesenteric artery and vein without circumferential involvement. No pancreatic ductal dilatation. Spleen: Calcified granulomas in the spleen. Adrenals/Urinary Tract: No adrenal gland nodules. Renal nephrograms are homogeneous and symmetrical. Small subcentimeter cysts. No hydronephrosis or  hydroureter. Bladder wall is not thickened. Stomach/Bowel: Stomach and small bowel are decompressed. Scattered gas and stool in the colon. No colonic distention or inflammation. Scattered colonic diverticula. Appendix is not identified. Vascular/Lymphatic: Aortic atherosclerosis. No enlarged abdominal or pelvic lymph nodes. Reproductive: Status post hysterectomy. No adnexal masses. Other: No free air or free fluid in the abdomen. Abdominal wall musculature appears intact. Musculoskeletal: Degenerative changes in the lumbar spine. Postoperative changes with laminectomies, posterior rod and screw fixation, and intervertebral disc prostheses from L3 through L5. IMPRESSION: Mass in the head and uncinate process of the pancreas is again demonstrated, with associated obstruction of the distal common bile duct. Biliary drainage catheter is in place with tip in the distal common bile duct. Mild residual intrahepatic bile duct dilatation. Cholelithiasis. Calcified granulomas. Right lower lung nodules measuring up to 5 mm without change. Esophageal wall thickening possibly due to reflux or esophagitis. Electronically Signed   By: Lucienne Capers M.D.   On: 10/07/2016 06:32   Ct Abdomen Pelvis W Contrast  Result Date: 09/20/2016 CLINICAL DATA:  Transaminitis. Dysuria and lower abdominal pain. Lumbar spine surgery 1 month ago. EXAM: CT ABDOMEN AND PELVIS WITH CONTRAST TECHNIQUE: Multidetector CT imaging of the abdomen and pelvis was performed using the standard protocol following bolus administration of intravenous contrast. CONTRAST:  80 mL Isovue 300 COMPARISON:  None. FINDINGS: Lower chest: 5 mm right lower lobe lung nodule (series 4, image 12). 3 mm right lower lobe nodule (series 4, image 14). 5 mm calcified nodule in the left lower lobe. No lung consolidation or pleural effusion. Hepatobiliary: Diffusely decreased attenuation of the liver suggesting steatosis. Scattered calcified granulomas in the liver. Mild  intrahepatic biliary dilatation. Common bile duct dilatation measuring 12 mm. 3.4 cm stone in the gallbladder without evidence of acute cholecystitis. Pancreas: There is an approximately 4.0 x 3.1 x 3.9 cm heterogeneously hypoenhancing mass at the level of the uncinate process of the pancreas. This may contact the posterior margin of the adjacent superior mesenteric artery and partially encases some small adjacent SMA branch vessels. There is not a substantial enough amount of surrounding stranding to suggest that this represents pancreatitis rather than neoplasm. There is pancreatic atrophy without ductal dilatation. Spleen: Calcified granulomas in the spleen. Adrenals/Urinary Tract: Unremarkable adrenal glands. No evidence of renal calculi or hydronephrosis. Subcentimeter low-density renal lesions bilaterally are too small to fully characterize but most likely represent cysts. Unremarkable bladder. Stomach/Bowel: Apparent mild diffuse wall thickening of the visualized distal esophagus. The stomach is within normal limits. Duodenal diverticular noted. No evidence of bowel obstruction. Mild colonic diverticulosis without evidence of diverticulitis. Prior appendectomy. Vascular/Lymphatic: Minimal atherosclerotic plaque in the abdominal aorta without aneurysm. Small portacaval lymph nodes measure up to 7 mm in short axis.  Reproductive: Prior hysterectomy.  No adnexal mass. Other: No intraperitoneal free fluid. Musculoskeletal: Postoperative changes from recent L3-L5 PLIF. IMPRESSION: 1. 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 Abdominal MRI is recommended for further evaluation. 2. Associated mild intrahepatic and extrahepatic biliary dilatation. 3. Small right lower lobe lung nodules measuring up to 5 mm, indeterminate. Metastatic disease is not excluded. 4. Cholelithiasis. 5. Hepatic steatosis. 6. Distal esophageal wall thickening, query  esophagitis. Electronically Signed   By: Logan Bores M.D.   On: 09/20/2016 21:13   Dg Chest Port 1 View  Result Date: 09/30/2016 CLINICAL DATA:  Fever EXAM: PORTABLE CHEST 1 VIEW COMPARISON:  Chest radiograph 08/20/2016 FINDINGS: Cardiomediastinal silhouette remains mildly enlarged. There are diffuse peribronchial opacities, likely indicating pulmonary edema. Unchanged left basilar calcified granuloma. No pneumothorax or sizable pleural effusion. IMPRESSION: Shallow lung inflation with mild pulmonary edema. Electronically Signed   By: Ulyses Jarred M.D.   On: 09/30/2016 04:49   Dg C-arm 1-60 Min-no Report  Result Date: 09/26/2016 Fluoroscopy was utilized by the requesting physician.  No radiographic interpretation.   Ir Int Lianne Cure Biliary Drain With Cholangiogram  Result Date: 09/29/2016 INDICATION: 71 year old with pancreatic mass and biliary obstruction. Patient needs a tissue diagnosis and biliary decompression. Scheduled for PTC with biliary drain placement. EXAM: PLACEMENT OF AN INTERNAL/EXTERNAL BILIARY DRAIN WITH ULTRASOUND AND FLUOROSCOPIC GUIDANCE MEDICATIONS: Zosyn 3.375 g; The antibiotic was administered within an appropriate time frame prior to the initiation of the procedure. Hydralazine 10 mg ANESTHESIA/SEDATION: Moderate (conscious) sedation was employed during this procedure. A total of Versed 3.0 mg and Fentanyl 100 mcg was administered intravenously. Moderate Sedation Time: 27 minutes. The patient's level of consciousness and vital signs were monitored continuously by radiology nursing throughout the procedure under my direct supervision. FLUOROSCOPY TIME:  Fluoroscopy Time: 1 minutes and 24 seconds, 59 mGy CONTRAST:  20 mL [id number] COMPLICATIONS: None immediate. PROCEDURE: Informed written consent was obtained from the patient after a thorough discussion of the procedural risks, benefits and alternatives. All questions were addressed. Maximal Sterile Barrier Technique was utilized  including caps, mask, sterile gowns, sterile gloves, sterile drape, hand hygiene and skin antiseptic. A timeout was performed prior to the initiation of the procedure. Patient was placed supine. The anterior and right side of the abdomen were prepped and draped in a sterile fashion. The liver was evaluated with ultrasound. Mildly dilated left hepatic duct was targeted with ultrasound. The anterior abdomen was anesthetized with 1% lidocaine. Heidelberg needle was directed into this mildly dilated intrahepatic bile duct with ultrasound guidance. A wire advanced easily into the biliary system under fluoroscopic guidance. A micropuncture dilator set was placed and contrast injection confirmed placement in the biliary system. At this point, the patient was very combative and required restraining in order to prevent the patient from coming off the table. The combativeness appear to be secondary to the sedation medicines rather than pain. 5 [place] Kumpe catheter and Glidewire were advanced across the distal biliary obstruction with minimal difficulty. Due to patient's combativeness and confusion, we had no opportunity to perform a brush biopsy. 10 [place] biliary drain was successfully placed over a stiff Amplatz wire. Tip was placed in the duodenum. Small amount of bile was aspirated for culture. Drain was sutured to skin and secured with a Stat Lock. FINDINGS: Mild dilatation of the intrahepatic ducts. Common bile duct is moderately distended. There is an obstruction at the distal common bile duct. Catheter and  wire were able to traverse the distal biliary obstruction. 10 French drain was successfully advanced into the duodenum. Unable to perform a brush biopsy due to patient's combative state during the procedure. IMPRESSION: Successful placement of an internal/external biliary drain. Patient will need to be scheduled at a later time for brush biopsy and potential internalization. Alternately, patient may need an EUS  for tissue diagnosis. Electronically Signed   By: Markus Daft M.D.   On: 09/29/2016 17:50    Procedures Procedures (including critical care time)  Medications Ordered in ED  Medications  iopamidol (ISOVUE-300) 61 % injection (not administered)  ciprofloxacin (CIPRO) IVPB 400 mg (not administered)  metroNIDAZOLE (FLAGYL) IVPB 500 mg (not administered)  morphine 4 MG/ML injection 1 mg (not administered)  pantoprazole (PROTONIX) injection 40 mg (not administered)  sodium chloride 0.9 % bolus 500 mL (not administered)  insulin aspart (novoLOG) injection 0-15 Units (not administered)  ondansetron (ZOFRAN-ODT) disintegrating tablet 4 mg (4 mg Oral Given 10/07/16 0143)  fentaNYL (SUBLIMAZE) injection 50 mcg (50 mcg Intravenous Given 10/07/16 0415)  sodium chloride 0.9 % bolus 500 mL (0 mLs Intravenous Stopped 10/07/16 0520)  iopamidol (ISOVUE-300) 61 % injection 100 mL (80 mLs Intravenous Contrast Given 10/07/16 0607)  sodium chloride 0.9 % bolus 1,000 mL (1,000 mLs Intravenous New Bag/Given 10/07/16 0653)  ondansetron (ZOFRAN) injection 4 mg (4 mg Intravenous Given 10/07/16 0654)     Final Clinical Impressions(s) / ED Diagnoses  Abdominal pain:  Likely from her biliary drain.  Will admit to medicine with IV antibiotics.      Veatrice Kells, MD 10/07/16 [phone]

## 2016-10-07 NOTE — Progress Notes (Signed)
Inpatient Diabetes Program Recommendations  AACE/ADA: New Consensus Statement on Inpatient Glycemic Control (2015)  Target Ranges:  Prepandial:   less than 140 mg/dL      Peak postprandial:   less than 180 mg/dL (1-2 hours)      Critically ill patients:  140 - 180 mg/dL   Lab Results  Component Value Date   GLUCAP 218 (H) 10/02/2016   HGBA1C 10.5 (H) 08/12/2016    Review of Glycemic Control  Inpatient Diabetes Program Recommendations:    Change Novolog to 0-15 units Q4H while NPO.  Will follow.  Thank you. Lorenda Peck, RD, LDN, CDE Inpatient Diabetes Coordinator [phone]

## 2016-10-07 NOTE — H&P (Addendum)
History and Physical    [name] IOE:[id number] DOB: Nov 18, 1945 DOA: 10/07/2016  PCP: Chevis Pretty, FNP  Patient coming from: Home   I have personally briefly reviewed patient's old medical records in [place]  Chief Complaint: Abdominal pain, nausea, vomiting.   HPI: Patricia Austin is a 71 y.o. female with medical history significant of  DM, Obesity, CKD stage III, pancreatic Mass, Patient was admitted 4-09 from interventional radiology following PTC drain placement for observation, she subsequently develops fever 103, leukocytosis, she was discharge 4-12  on Augmentin. Biopsy has not be able to be obtain. Patient presents with persistent abdominal pain, discomfort, difficult to describe pain. Also now complaining of back pain. He has history of back fusion. She report persistent nausea, not eating, poor oral intake. She missed her antibiotics dose yesterday.   ED Course: Presents with persistent symptoms, nausea, vomiting, found to have hyponatremia, hemoconcentration hb at 12, increase bilirubin, CBG 412, Alkaline phosphatase 333, lipase 78, AST 43, WBC 17, UA; WBC 0-5.  CT abdomen, pelvis; Mass in the head and uncinate process of the pancreas is again demonstrated, with associated obstruction of the distal common bile duct. Biliary drainage catheter is in place with tip in the distal common bile duct. Mild residual intrahepatic bile duct dilatation. Cholelithiasis. Calcified granulomas. Right lower lung nodules measuring up to 5 mm without change. Esophageal wall thickening possibly due to reflux or esophagitis.  Review of Systems: As per HPI otherwise 10 point review of systems negative.    Past Medical History:  Diagnosis Date  . Anxiety    "not all the time"   . Chronic lower back pain   . Complication of anesthesia 2002   difficulty /w intubation, with plastic surgery , because she was feeling so anxious. 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 .   Pt. reports  that she  is very sensitive to med. & is hard  to wake up.  . Depression    "not all the time"   . Difficult intubation 2002   with head surgery 2002 has done ok with later intubations  . Dysrhythmia    told that she sometimes has extra beats. Does not see cardiologist, had a stress test many yrs. ago>10 yrs.   . Esophageal dysmotility    "getting worse recenly" (12/15/2013)  . Fibromyalgia   . Hypertension   . Nephritis ~ 1955  . Osteoarthritis    knees   . Pancreatic mass   . Seasonal allergies    "in the spring"  . Shortness of breath    walking long distances   . squamous cell carcinoma scalp    squamous, on scalp, & has had several surgeries on scalp, for plastics repair of scalp   . Type II diabetes mellitus ([place])     Past Surgical History:  Procedure Laterality Date  . ABDOMINAL HYSTERECTOMY     complete  . APPENDECTOMY    . BACK SURGERY  07/2016   L 4 and L 5 fusion done [facility]   . CARPAL TUNNEL RELEASE Right   . CARPAL TUNNEL RELEASE Left   . COLONOSCOPY    . CYST EXCISION  1990's?   scalp  . ESOPHAGOGASTRODUODENOSCOPY N/A 09/26/2016   Procedure: ESOPHAGOGASTRODUODENOSCOPY (EGD);  Surgeon: Gatha Mayer, MD;  Location: [facility] ENDOSCOPY;  Service: Endoscopy;  Laterality: N/A;  . IR INT EXT BILIARY DRAIN WITH CHOLANGIOGRAM  09/29/2016   2 gallstones  . LUMBAR DISC SURGERY  ~ 2012  X 2  . SKIN GRAFT  2002   scalp; "went to [facility] to repair OR earlier in the yearr"  . SQUAMOUS CELL CARCINOMA EXCISION  2002   "off scalp; cancerous area removed and stapled; Dr. Georgia Lopes"  . TONSILLECTOMY  age 44  . TOTAL KNEE ARTHROPLASTY Left 12/14/2013   Procedure: LEFT TOTAL KNEE ARTHROPLASTY;  Surgeon: Ninetta Lights, MD;  Location: [facility];  Service: Orthopedics;  Laterality: Left;  . TOTAL KNEE ARTHROPLASTY Left 12/15/2013     reports that she has never smoked. She has never used smokeless tobacco. She reports that she does not drink alcohol or use drugs.  No Known Allergies  Family  History  Problem Relation Age of Onset  . Early death Mother   . Thrombosis Father      Prior to Admission medications   Medication Sig Start Date End Date Taking? Authorizing Provider  amLODipine (NORVASC) 5 MG tablet Take 1 tablet (5 mg total) by mouth daily. Patient taking differently: Take 5 mg by mouth at bedtime.  07/18/16  Yes Mary-Margaret Hassell Done, FNP  amoxicillin-clavulanate (AUGMENTIN) 500-125 MG tablet Take 1 tablet (500 mg total) by mouth 2 (two) times daily. 10/02/16  Yes Verlee Monte, MD  benazepril (LOTENSIN) 40 MG tablet Take 1 tablet (40 mg total) by mouth at bedtime. 10/02/16  Yes Verlee Monte, MD  carvedilol (COREG) 25 MG tablet TAKE  (1)  TABLET TWICE A DAY WITH MEALS (BREAKFAST AND SUPPER) Patient taking differently: Take 25 mg by mouth 2 (two) times daily with a meal.  07/18/16  Yes Mary-Margaret Hassell Done, FNP  colestipol (COLESTID) 5 g packet Take 5 g by mouth 2 (two) times daily. 09/26/16  Yes Gatha Mayer, MD  diclofenac (VOLTAREN) 75 MG EC tablet Take 75 mg by mouth 2 (two) times daily as needed for mild pain.   Yes Historical Provider, MD  DULoxetine (CYMBALTA) 60 MG capsule Take 1 capsule (60 mg total) by mouth 2 (two) times daily. Patient taking differently: Take 60 mg by mouth daily.  07/18/16  Yes Mary-Margaret Hassell Done, FNP  furosemide (LASIX) 20 MG tablet Take 1 tablet (20 mg total) by mouth daily. Patient taking differently: Take 20 mg by mouth daily as needed for edema.  07/18/16  Yes Mary-Margaret Hassell Done, FNP  gabapentin (NEURONTIN) 300 MG capsule TAKE (1) CAPSULE TWICE DAILY. Patient taking differently: Take 300 mg by mouth at bedtime.  07/18/16  Yes Mary-Margaret Hassell Done, FNP  glipiZIDE-metformin (METAGLIP) 5-500 MG tablet Take 2 tablets by mouth 2 (two) times daily before a meal. 07/18/16  Yes Mary-Margaret Hassell Done, FNP  HYDROcodone-acetaminophen (NORCO) 7.5-325 MG tablet Take 1 tablet by mouth every 6 (six) hours as needed for moderate pain. 08/21/16  Yes Ashok Pall,  MD  Insulin Glargine (TOUJEO SOLOSTAR) 300 UNIT/ML SOPN Inject 60 Units into the skin daily. Patient taking differently: Inject 40 Units into the skin at bedtime.  08/15/16  Yes Mary-Margaret Hassell Done, FNP  pantoprazole (PROTONIX) 40 MG tablet Take 1 tablet (40 mg total) by mouth daily before breakfast. 09/26/16  Yes Gatha Mayer, MD    Physical Exam: Vitals:   10/07/16 0403 10/07/16 0404 10/07/16 0628 10/07/16 0707  BP: (!) 162/75 (!) 162/75 (!) 159/88 (!) 159/88  Pulse: (!) 107 (!) 108 (!) 104 98  Resp: 18   18  Temp:      TempSrc:      SpO2: 96% 96% 98% 97%  Weight:      Height:  Constitutional: NAD, calm, comfortable Vitals:   10/07/16 0403 10/07/16 0404 10/07/16 0628 10/07/16 0707  BP: (!) 162/75 (!) 162/75 (!) 159/88 (!) 159/88  Pulse: (!) 107 (!) 108 (!) 104 98  Resp: 18   18  Temp:      TempSrc:      SpO2: 96% 96% 98% 97%  Weight:      Height:       Eyes: PERRL, mildly icteric.  ENMT: Mucous membranes are moist. Posterior pharynx clear of any exudate or lesions.Normal dentition.  Neck: normal, supple, no masses, no thyromegaly Respiratory: clear to auscultation bilaterally, no wheezing, no crackles. Normal respiratory effort. No accessory muscle use.  Cardiovascular: Regular rate and rhythm, no murmurs / rubs / gallops. No extremity edema. 2+ pedal pulses. No carotid bruits.  Abdomen: mild generalized tenderness, no masses palpated. No hepatosplenomegaly. Bowel sounds positive. PTC drain in place mid-abdomen, bag with greenish fluids.   Musculoskeletal: no clubbing / cyanosis. No joint deformity upper and lower extremities. Good ROM, no contractures. Normal muscle tone.  Skin: no rashes, lesions, ulcers. No induration Neurologic: CN 2-12 grossly intact. Sensation intact, DTR normal. Strength 5/5 in all 4.  Psychiatric: Normal judgment and insight. Alert and oriented x 3. Normal mood.     Labs on Admission: I have personally reviewed following labs and imaging  studies  CBC:  Recent Labs Lab 10/01/16 0813 10/02/16 0403 10/07/16 0135  WBC 9.9 6.8 17.6*  HGB 9.2* 8.9* 12.0  HCT 27.3* 26.9* 35.3*  MCV 96.8 97.8 93.4  PLT 202 212 878   Basic Metabolic Panel:  Recent Labs Lab 10/01/16 0813 10/02/16 0403 10/03/16 1256 10/07/16 0135  NA 137 137 138 129*  K 3.8 4.4 5.1 4.2  CL 107 108 101 94*  CO2 22 21* 21 25  GLUCOSE 120* 265* 399* 412*  BUN 32* 31* 21 16  CREATININE 1.79* 1.92* 1.53* 1.17*  CALCIUM 8.4* 8.5* 9.5 9.7   GFR: Estimated Creatinine Clearance: 44.1 mL/min (A) (by C-G formula based on SCr of 1.17 mg/dL (H)). Liver Function Tests:  Recent Labs Lab 10/02/16 0403 10/03/16 1256 10/07/16 0135  AST 29 29 43*  ALT 25 25 32  ALKPHOS 271* 353* 333*  BILITOT 2.5* 2.6* 4.3*  PROT 5.8* 6.4 7.7  ALBUMIN 2.2* 3.5 3.1*    Recent Labs Lab 10/07/16 0135  LIPASE 78*   No results for input(s): AMMONIA in the last 168 hours. Coagulation Profile: No results for input(s): INR, PROTIME in the last 168 hours. Cardiac Enzymes: No results for input(s): CKTOTAL, CKMB, CKMBINDEX, TROPONINI in the last 168 hours. BNP (last 3 results) No results for input(s): PROBNP in the last 8760 hours. HbA1C: No results for input(s): HGBA1C in the last 72 hours. CBG:  Recent Labs Lab 10/01/16 1202 10/01/16 1711 10/01/16 2117 10/02/16 0717 10/02/16 1156  GLUCAP 91 218* 235* 233* 218*   Lipid Profile: No results for input(s): CHOL, HDL, LDLCALC, TRIG, CHOLHDL, LDLDIRECT in the last 72 hours. Thyroid Function Tests: No results for input(s): TSH, T4TOTAL, FREET4, T3FREE, THYROIDAB in the last 72 hours. Anemia Panel: No results for input(s): VITAMINB12, FOLATE, FERRITIN, TIBC, IRON, RETICCTPCT in the last 72 hours. Urine analysis:    Component Value Date/Time   COLORURINE AMBER (A) 10/07/2016 0133   APPEARANCEUR CLEAR 10/07/2016 0133   LABSPEC 1.024 10/07/2016 0133   PHURINE 5.0 10/07/2016 0133   GLUCOSEU >=500 (A) 10/07/2016  0133   HGBUR NEGATIVE 10/07/2016 0133   BILIRUBINUR NEGATIVE 10/07/2016 0133  BILIRUBINUR negative 04/06/2013 1723   KETONESUR NEGATIVE 10/07/2016 0133   PROTEINUR 100 (A) 10/07/2016 0133   UROBILINOGEN 1.0 12/06/2013 1244   NITRITE NEGATIVE 10/07/2016 0133   LEUKOCYTESUR NEGATIVE 10/07/2016 0133    Radiological Exams on Admission: Ct Abdomen Pelvis W Contrast  Result Date: 10/07/2016 CLINICAL DATA:  Worsening generalized abdominal pain for 1 week. History of pancreatic mass post cholangiogram with biliary drainage placement. Draining green fluid. White cell count 17.6. Elevated lipase. History of hypertension and diabetes. EXAM: CT ABDOMEN AND PELVIS WITH CONTRAST TECHNIQUE: Multidetector CT imaging of the abdomen and pelvis was performed using the standard protocol following bolus administration of intravenous contrast. CONTRAST:  80 mL Isovue-300 COMPARISON:  09/20/2016 FINDINGS: Lower chest: Calcified granulomas in the left lung base. Nodule in the right lung base measuring 5 mm diameter, unchanged. Distal esophageal wall is thickened possibly indicating reflux or esophagitis. Hepatobiliary: Scattered calcified granulomas in the liver. Mild intrahepatic bile duct dilatation. Biliary drainage catheter is present, injuring through the left lobe of the liver. Distal catheter is demonstrated in the lower common bile duct. Cholelithiasis with a large stone in the gallbladder. Small amount of gas in the gallbladder is likely related to catheter insertion. Pancreas: There is a mass in the head and uncinate process of the pancreas measuring up to 4 x 4.2 cm diameter. Central necrosis is present. The mass is causing obstruction of the distal common bile duct. Mass is contiguous with the duodenum and erect duodenal invasion is not excluded. The mass abuts the superior mesenteric artery and vein without circumferential involvement. No pancreatic ductal dilatation. Spleen: Calcified granulomas in the spleen.  Adrenals/Urinary Tract: No adrenal gland nodules. Renal nephrograms are homogeneous and symmetrical. Small subcentimeter cysts. No hydronephrosis or hydroureter. Bladder wall is not thickened. Stomach/Bowel: Stomach and small bowel are decompressed. Scattered gas and stool in the colon. No colonic distention or inflammation. Scattered colonic diverticula. Appendix is not identified. Vascular/Lymphatic: Aortic atherosclerosis. No enlarged abdominal or pelvic lymph nodes. Reproductive: Status post hysterectomy. No adnexal masses. Other: No free air or free fluid in the abdomen. Abdominal wall musculature appears intact. Musculoskeletal: Degenerative changes in the lumbar spine. Postoperative changes with laminectomies, posterior rod and screw fixation, and intervertebral disc prostheses from L3 through L5. IMPRESSION: Mass in the head and uncinate process of the pancreas is again demonstrated, with associated obstruction of the distal common bile duct. Biliary drainage catheter is in place with tip in the distal common bile duct. Mild residual intrahepatic bile duct dilatation. Cholelithiasis. Calcified granulomas. Right lower lung nodules measuring up to 5 mm without change. Esophageal wall thickening possibly due to reflux or esophagitis. Electronically Signed   By: Lucienne Capers M.D.   On: 10/07/2016 06:32    EKG: none.   Assessment/Plan Active Problems:   Diabetes ([place])   Morbid obesity ([facility])   Hypertension   GERD (gastroesophageal reflux disease)   CKD (chronic kidney disease) stage 3, GFR 30-59 ml/min   Pancreatic mass   Biliary obstruction   Hyponatremia   1-Pancreatic Mass, biliary Obstruction. Cholangitis.  Patient presents with persistent, nausea, abdominal pain poor oral intake. Mild elevation of lipase.  Presents with leukocytosis, last admission treated for cholangitis. She has PTC in place draining. Will start IV zosyn.  NPO, IV fluids. IV protonix.  GI consulted. Patient also  supposed to have Biopsy on Thursday.  Increased bilirubin, follow labs in am.  Blood culture.    2-Dehydration, Hyponatremia;  Related to poor oral intake.  Received 1.5  L of fluid in the ED  IV fluids.   3-HTN;  Continue with norvasc, coreg.  Hold benzepril in setting of dehydration.  PRN hydralazine.   4-DM; continue with Toujeo.  SSI. Hold metformin-glipizide while in the hospital.   5-GERD; IV Protonix.  6-Neuropathy; continue with gabapentin.  7-CKD stage III; stable, IV fluids.    DVT prophylaxis: Heparin.  Code Status: Full code.  Family Communication: Husband at bedside.  Disposition Plan: Home when stable.  Consults called: GI, [place].  Admission status: inpatient, med-surgery   Elmarie Shiley MD [facility] Pager [phone]  If 7PM-7AM, please contact night-coverage [email or web address] Password [email or web address]  10/07/2016, 9:05 AM

## 2016-10-07 NOTE — ED Notes (Signed)
Patient transported to CT 

## 2016-10-07 NOTE — Progress Notes (Signed)
Pharmacy Antibiotic Follow-up Note  Patricia Austin is a 71 y.o. year-old female admitted on 10/07/2016.  The patient is currently on day 1 of Zosyn for Intra-abd Infection.  Assessment/Plan: Zosyn 3.375g IV q8h (4 hour infusion).  Temp (24hrs), Avg:98.5 F (36.9 C), Min:98.1 F (36.7 C), Max:98.8 F (37.1 C)   Recent Labs Lab 10/01/16 0813 10/02/16 0403 10/07/16 0135  WBC 9.9 6.8 17.6*    Recent Labs Lab 10/01/16 0813 10/02/16 0403 10/03/16 1256 10/07/16 0135  CREATININE 1.79* 1.92* 1.53* 1.17*   Estimated Creatinine Clearance: 44.1 mL/min (A) (by C-G formula based on SCr of 1.17 mg/dL (H)).    No Known Allergies  Antimicrobials this admission: 4/17 Cipro & Flagyl ordered x1 in ED 4/17 Zosyn >>   Microbiology results:       BCx: ordered   Thank you for allowing pharmacy to be a part of this patient's care.  Minda Ditto PharmD Pager [phone] 10/07/2016, 9:16 AM

## 2016-10-07 NOTE — ED Notes (Signed)
Patient returned from CT

## 2016-10-07 NOTE — Consult Note (Signed)
Consultation  Referring Provider:  Dr. Tyrell Antonio    Primary Care Physician:  Chevis Pretty, FNP Primary Gastroenterologist:   Dr. Hilarie Fredrickson    Reason for Consultation:  Abdominal pain, vomiting, leukocytosis, s/p biliary drain placement, pancreatic mass            HPI:   Patricia Austin is a 71 y.o. female with a past medical history of GERD, DM, Obesity, CKD stage III and recent finding of pancreatis mass, s/p PTC drain placement for cholangitis on 09/29/16, who presented to the ER this morning with complaint of persistent abdominal pain, discomfort and back pain along with nausea and vomiting.   Recent history shows patient was admitted 09/29/16 from IR following PTC drain placement for observation and subsequently developed fever of 103 and leukocytosis and was discharged 10/02/16 on Augmentin. EUS with FNA had originally been scheduled 10/02/16 for biopsy but was then changed to 10/09/16 due to fever etc.    Today, the patient is found laying in bed with her husband by her bedside, she tells me that after being discharged 10/02/16 she was doing "ok" for about 1-2 days, but then over the weekend had a fair amount of epigastric abdominal pain and a severe loss of appetite and became nauseous and just felt "sick". This continued over the weekend and in fact patient missed her abx dose yesterday because she "just didn't feel good", so the patient presented to the ED.  Patient has also noticed a change in fluid color from yellow to dark green and has had a decrease from ~ 400cc per day to 200-300 cc/d over the past 2 days.   Patient is scheduled for EUS FNA on 10/09/16 with Dr. Jyl Heinz this will need to be postponed   Denies fever, chills, vomiting or change in bowel habits.  ED Course: Presents with persistent symptoms, nausea, vomiting, found to have hyponatremia, hemoconcentration hb at 12, increase bilirubin, CBG 412, Alkaline phosphatase 333, lipase 78, AST 43, WBC 17, UA; WBC 0-5.  CT  abdomen, pelvis; Mass in the head and uncinate process of the pancreas is again demonstrated, with associated obstruction of the distal common bile duct. Biliary drainage catheter is in place with tip in the distal common bile duct. Mild residual intrahepatic bile duct dilatation. Cholelithiasis. Calcified granulomas. Right lower lung nodules measuring up to 5 mm without change. Esophageal wall thickening possibly due to reflux or esophagitis.  Recent GI History: As above and: 09/26/16- ERCP, Dr. Carlean Purl: Impression: Aborted due to difficulty of procedure, duodenal ulcers thought from diclofenac, deformed and distorted duodenum with 2 diverticula and unable to identify papilla; Recs: at that time IR appt was arranged for percutaneous biliary drain to treat biliary obstruction from pancreatic mass  Past Medical History:  Diagnosis Date  . Anxiety    "not all the time"   . Chronic lower back pain   . Complication of anesthesia 2002   difficulty /w intubation, with plastic surgery , because she was feeling so anxious. 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 .   Pt. reports that she  is very sensitive to med. & is hard  to wake up.  . Depression    "not all the time"   . Difficult intubation 2002   with head surgery 2002 has done ok with later intubations  . Dysrhythmia    told that she sometimes has extra beats. Does not see cardiologist, had a stress test many yrs. ago>10 yrs.   . Esophageal  dysmotility    "getting worse recenly" (12/15/2013)  . Fibromyalgia   . Hypertension   . Nephritis ~ 1955  . Osteoarthritis    knees   . Pancreatic mass   . Seasonal allergies    "in the spring"  . Shortness of breath    walking long distances   . squamous cell carcinoma scalp    squamous, on scalp, & has had several surgeries on scalp, for plastics repair of scalp   . Type II diabetes mellitus ([place])     Past Surgical History:  Procedure Laterality Date  . ABDOMINAL HYSTERECTOMY      complete  . APPENDECTOMY    . BACK SURGERY  07/2016   L 4 and L 5 fusion done [facility]   . CARPAL TUNNEL RELEASE Right   . CARPAL TUNNEL RELEASE Left   . COLONOSCOPY    . CYST EXCISION  1990's?   scalp  . ESOPHAGOGASTRODUODENOSCOPY N/A 09/26/2016   Procedure: ESOPHAGOGASTRODUODENOSCOPY (EGD);  Surgeon: Gatha Mayer, MD;  Location: [facility] ENDOSCOPY;  Service: Endoscopy;  Laterality: N/A;  . IR INT EXT BILIARY DRAIN WITH CHOLANGIOGRAM  09/29/2016   2 gallstones  . LUMBAR DISC SURGERY  ~ 2012 X 2  . SKIN GRAFT  2002   scalp; "went to [facility] to repair OR earlier in the yearr"  . SQUAMOUS CELL CARCINOMA EXCISION  2002   "off scalp; cancerous area removed and stapled; Dr. Georgia Lopes"  . TONSILLECTOMY  age 39  . TOTAL KNEE ARTHROPLASTY Left 12/14/2013   Procedure: LEFT TOTAL KNEE ARTHROPLASTY;  Surgeon: Ninetta Lights, MD;  Location: [place];  Service: Orthopedics;  Laterality: Left;  . TOTAL KNEE ARTHROPLASTY Left 12/15/2013    Family History  Problem Relation Age of Onset  . Early death Mother   . Thrombosis Father     Social History  Substance Use Topics  . Smoking status: Never Smoker  . Smokeless tobacco: Never Used  . Alcohol use No    Prior to Admission medications   Medication Sig Start Date End Date Taking? Authorizing Provider  amLODipine (NORVASC) 5 MG tablet Take 1 tablet (5 mg total) by mouth daily. Patient taking differently: Take 5 mg by mouth at bedtime.  07/18/16  Yes Mary-Margaret Hassell Done, FNP  amoxicillin-clavulanate (AUGMENTIN) 500-125 MG tablet Take 1 tablet (500 mg total) by mouth 2 (two) times daily. 10/02/16  Yes Verlee Monte, MD  benazepril (LOTENSIN) 40 MG tablet Take 1 tablet (40 mg total) by mouth at bedtime. 10/02/16  Yes Verlee Monte, MD  carvedilol (COREG) 25 MG tablet TAKE  (1)  TABLET TWICE A DAY WITH MEALS (BREAKFAST AND SUPPER) Patient taking differently: Take 25 mg by mouth 2 (two) times daily with a meal.  07/18/16  Yes Mary-Margaret Hassell Done, FNP  colestipol  (COLESTID) 5 g packet Take 5 g by mouth 2 (two) times daily. 09/26/16  Yes Gatha Mayer, MD  diclofenac (VOLTAREN) 75 MG EC tablet Take 75 mg by mouth 2 (two) times daily as needed for mild pain.   Yes Historical Provider, MD  DULoxetine (CYMBALTA) 60 MG capsule Take 1 capsule (60 mg total) by mouth 2 (two) times daily. Patient taking differently: Take 60 mg by mouth daily.  07/18/16  Yes Mary-Margaret Hassell Done, FNP  furosemide (LASIX) 20 MG tablet Take 1 tablet (20 mg total) by mouth daily. Patient taking differently: Take 20 mg by mouth daily as needed for edema.  07/18/16  Yes Mary-Margaret Hassell Done, FNP  gabapentin (NEURONTIN) 300  MG capsule TAKE (1) CAPSULE TWICE DAILY. Patient taking differently: Take 300 mg by mouth at bedtime.  07/18/16  Yes Mary-Margaret Hassell Done, FNP  glipiZIDE-metformin (METAGLIP) 5-500 MG tablet Take 2 tablets by mouth 2 (two) times daily before a meal. 07/18/16  Yes Mary-Margaret Hassell Done, FNP  HYDROcodone-acetaminophen (NORCO) 7.5-325 MG tablet Take 1 tablet by mouth every 6 (six) hours as needed for moderate pain. 08/21/16  Yes Ashok Pall, MD  Insulin Glargine (TOUJEO SOLOSTAR) 300 UNIT/ML SOPN Inject 60 Units into the skin daily. Patient taking differently: Inject 40 Units into the skin at bedtime.  08/15/16  Yes Mary-Margaret Hassell Done, FNP  pantoprazole (PROTONIX) 40 MG tablet Take 1 tablet (40 mg total) by mouth daily before breakfast. 09/26/16  Yes Gatha Mayer, MD    Current Facility-Administered Medications  Medication Dose Route Frequency Provider Last Rate Last Dose  . 0.9 %  sodium chloride infusion   Intravenous Continuous Belkys A Regalado, MD      . acetaminophen (TYLENOL) tablet 650 mg  650 mg Oral Q6H PRN Belkys A Regalado, MD       Or  . acetaminophen (TYLENOL) suppository 650 mg  650 mg Rectal Q6H PRN Belkys A Regalado, MD      . amLODipine (NORVASC) tablet 5 mg  5 mg Oral QHS Belkys A Regalado, MD      . carvedilol (COREG) tablet 25 mg  25 mg Oral BID WC Belkys  A Regalado, MD      . ciprofloxacin (CIPRO) IVPB 400 mg  400 mg Intravenous Once April Palumbo, MD      . colestipol (COLESTID) packet 5 g  5 g Oral 2 times per day Belkys A Regalado, MD      . DULoxetine (CYMBALTA) DR capsule 60 mg  60 mg Oral Daily Belkys A Regalado, MD      . gabapentin (NEURONTIN) capsule 300 mg  300 mg Oral QHS Belkys A Regalado, MD      . heparin injection 5,000 Units  5,000 Units Subcutaneous Q8H Belkys A Regalado, MD      . hydrALAZINE (APRESOLINE) injection 10 mg  10 mg Intravenous Q6H PRN Belkys A Regalado, MD      . HYDROcodone-acetaminophen (NORCO) 7.5-325 MG per tablet 1 tablet  1 tablet Oral Q6H PRN Belkys A Regalado, MD      . insulin aspart (novoLOG) injection 0-15 Units  0-15 Units Subcutaneous TID WC Belkys A Regalado, MD      . insulin glargine (LANTUS) injection 40 Units  40 Units Subcutaneous QHS Belkys A Regalado, MD      . iopamidol (ISOVUE-300) 61 % injection           . metroNIDAZOLE (FLAGYL) IVPB 500 mg  500 mg Intravenous Once April Palumbo, MD      . morphine 4 MG/ML injection 1 mg  1 mg Intravenous Q4H PRN Belkys A Regalado, MD      . ondansetron (ZOFRAN) tablet 4 mg  4 mg Oral Q6H PRN Belkys A Regalado, MD       Or  . ondansetron (ZOFRAN) injection 4 mg  4 mg Intravenous Q6H PRN Belkys A Regalado, MD      . pantoprazole (PROTONIX) injection 40 mg  40 mg Intravenous Q12H Belkys A Regalado, MD      . piperacillin-tazobactam (ZOSYN) IVPB 3.375 g  3.375 g Intravenous Q8H Terri L Green, RPH      . sodium chloride 0.9 % bolus 500 mL  500 mL Intravenous Once  Elmarie Shiley, MD        Allergies as of 10/06/2016 - Review Complete 10/06/2016  Allergen Reaction Noted  . No known allergies  08/19/2016     Review of Systems:    Constitutional: No fever or chills Skin: No rash  Cardiovascular: No chest pain Respiratory: No SOB Gastrointestinal: See HPI and otherwise negative Genitourinary: No dysuria or change in urinary frequency Neurological: No  headache, dizziness or syncope Musculoskeletal: No new muscle or joint pain Hematologic: No bleeding or bruising Psychiatric: Positive history of anxiety and depression   Physical Exam:  Vital signs in last 24 hours: Temp:  [98.1 F (36.7 C)-98.8 F (37.1 C)] 98.8 F (37.1 C) (04/17 0923) Pulse Rate:  [98-113] 100 (04/17 0923) Resp:  [16-18] 18 (04/17 0923) BP: (159-200)/(66-88) 164/66 (04/17 0923) SpO2:  [95 %-98 %] 97 % (04/17 0923) Weight:  [181 lb (82.1 kg)] 181 lb (82.1 kg) (04/17 0005)   General:   Female appears to be in NAD, Well developed, Well nourished, alert and cooperative Head:  Normocephalic and atraumatic. Eyes:   PEERL, EOMI. Mildly icteric Conjunctiva pink. Ears:  Normal auditory acuity. Neck:  Supple Throat: Oral cavity and pharynx without inflammation, swelling or lesion. Teeth in good condition. Lungs: Respirations even and unlabored. Lungs clear to auscultation bilaterally.   No wheezes, crackles, or rhonchi.  Heart: Normal S1, S2. No MRG. Regular rate and rhythm. No peripheral edema, cyanosis or pallor.  Abdomen:  Soft, nondistended, mild generalized tenderness No rebound or guarding. Normal bowel sounds. No appreciable masses or hepatomegaly.PTC drain in place, mid-abdomen, greenish fluid discharge Rectal:  Not performed.  Msk:  Symmetrical without gross deformities. Extremities:  Without edema, no deformity or joint abnormality.  Neurologic:  Alert and  oriented x4;  grossly normal neurologically.  Skin:   Dry and intact without significant lesions or rashes. Psychiatric: Demonstrates good judgement and reason without abnormal affect or behaviors.   LAB RESULTS:  Recent Labs  10/07/16 0135  WBC 17.6*  HGB 12.0  HCT 35.3*  PLT 373   BMET  Recent Labs  10/07/16 0135  NA 129*  K 4.2  CL 94*  CO2 25  GLUCOSE 412*  BUN 16  CREATININE 1.17*  CALCIUM 9.7   LFT  Recent Labs  10/07/16 0135  PROT 7.7  ALBUMIN 3.1*  AST 43*  ALT 32    ALKPHOS 333*  BILITOT 4.3*    STUDIES: Ct Abdomen Pelvis W Contrast  Result Date: 10/07/2016 CLINICAL DATA:  Worsening generalized abdominal pain for 1 week. History of pancreatic mass post cholangiogram with biliary drainage placement. Draining green fluid. White cell count 17.6. Elevated lipase. History of hypertension and diabetes. EXAM: CT ABDOMEN AND PELVIS WITH CONTRAST TECHNIQUE: Multidetector CT imaging of the abdomen and pelvis was performed using the standard protocol following bolus administration of intravenous contrast. CONTRAST:  80 mL Isovue-300 COMPARISON:  09/20/2016 FINDINGS: Lower chest: Calcified granulomas in the left lung base. Nodule in the right lung base measuring 5 mm diameter, unchanged. Distal esophageal wall is thickened possibly indicating reflux or esophagitis. Hepatobiliary: Scattered calcified granulomas in the liver. Mild intrahepatic bile duct dilatation. Biliary drainage catheter is present, injuring through the left lobe of the liver. Distal catheter is demonstrated in the lower common bile duct. Cholelithiasis with a large stone in the gallbladder. Small amount of gas in the gallbladder is likely related to catheter insertion. Pancreas: There is a mass in the head and uncinate process of the pancreas measuring  up to 4 x 4.2 cm diameter. Central necrosis is present. The mass is causing obstruction of the distal common bile duct. Mass is contiguous with the duodenum and erect duodenal invasion is not excluded. The mass abuts the superior mesenteric artery and vein without circumferential involvement. No pancreatic ductal dilatation. Spleen: Calcified granulomas in the spleen. Adrenals/Urinary Tract: No adrenal gland nodules. Renal nephrograms are homogeneous and symmetrical. Small subcentimeter cysts. No hydronephrosis or hydroureter. Bladder wall is not thickened. Stomach/Bowel: Stomach and small bowel are decompressed. Scattered gas and stool in the colon. No colonic  distention or inflammation. Scattered colonic diverticula. Appendix is not identified. Vascular/Lymphatic: Aortic atherosclerosis. No enlarged abdominal or pelvic lymph nodes. Reproductive: Status post hysterectomy. No adnexal masses. Other: No free air or free fluid in the abdomen. Abdominal wall musculature appears intact. Musculoskeletal: Degenerative changes in the lumbar spine. Postoperative changes with laminectomies, posterior rod and screw fixation, and intervertebral disc prostheses from L3 through L5. IMPRESSION: Mass in the head and uncinate process of the pancreas is again demonstrated, with associated obstruction of the distal common bile duct. Biliary drainage catheter is in place with tip in the distal common bile duct. Mild residual intrahepatic bile duct dilatation. Cholelithiasis. Calcified granulomas. Right lower lung nodules measuring up to 5 mm without change. Esophageal wall thickening possibly due to reflux or esophagitis. Electronically Signed   By: Lucienne Capers M.D.   On: 10/07/2016 06:32   PREVIOUS ENDOSCOPIES:            See HPI   Impression / Plan:   Impression: 1. Pancreatic mass with biliary obstruction and cholangitis: pt s/p PTC drain placement after unsuccessful ERCP attempt on 09/29/16- pt did develop pain and leukocytosis after drain placement and was d/c on Augmentin; now worsening symptoms and leukocytosis as well as increased bilirubin- from 2.6 on 10/03/16 to 4.3 this morning, ASt up from 29 to 43 in 4 days, WBC 17.6 at time of admission, also decreased dran output over the past 24-48 hours and change in color to a dark green from yellow  Plan: 1. Continue IV abx and fluid support 2. Likely EUS with FNA will need to be delayed, but will discuss with Dr. Loletha Carrow and Dr. Ardis Hughs 3. Please await any further recs from Dr. Loletha Carrow later today  Thank you for your kind consultation, we will continue to follow.  Lavone Nian Lemmon  10/07/2016, 11:00 AM Pager #:  [phone]   I have reviewed the entire case in detail with the above APP and discussed the plan in detail.  Therefore, I agree with the diagnoses recorded above. In addition,  I have personally interviewed and examined the patient and have personally reviewed any abdominal/pelvic CT scan images.  My additional thoughts are as follows:  Malignant appearing pancreatic mass, markedly elevated CA 19-9. Causing biliary obstruction requiring recent percutaneous drain.  There is copious dark green bile in the bag, so I am not concerned that the drainage tube is obstructed.  Her acute symptoms appear due to bilary infection.  Agree with broader Abx coverage with zosyn.  I think she will still be able to have her EUS the day after tomorrow.  Will discuss with Dr Ardis Hughs.  Nelida Meuse III Pager [phone]  Mon-Fri 8a-5p [phone] after 5p, weekends, holidays

## 2016-10-07 NOTE — Progress Notes (Signed)
Tried to call ED for report,ED not ready.

## 2016-10-07 NOTE — ED Triage Notes (Signed)
Pt reports having abd pain and nausea after having drainage of pancreas on 09/29/16. Pt states that stomach has been painful to touch since then.

## 2016-10-08 DIAGNOSIS — Z794 Long term (current) use of insulin: Secondary | ICD-10-CM | POA: Diagnosis not present

## 2016-10-08 DIAGNOSIS — R109 Unspecified abdominal pain: Secondary | ICD-10-CM | POA: Diagnosis not present

## 2016-10-08 DIAGNOSIS — G629 Polyneuropathy, unspecified: Secondary | ICD-10-CM | POA: Diagnosis present

## 2016-10-08 DIAGNOSIS — I129 Hypertensive chronic kidney disease with stage 1 through stage 4 chronic kidney disease, or unspecified chronic kidney disease: Secondary | ICD-10-CM | POA: Diagnosis not present

## 2016-10-08 DIAGNOSIS — K219 Gastro-esophageal reflux disease without esophagitis: Secondary | ICD-10-CM

## 2016-10-08 DIAGNOSIS — F329 Major depressive disorder, single episode, unspecified: Secondary | ICD-10-CM | POA: Diagnosis present

## 2016-10-08 DIAGNOSIS — K8021 Calculus of gallbladder without cholecystitis with obstruction: Secondary | ICD-10-CM | POA: Diagnosis not present

## 2016-10-08 DIAGNOSIS — R1013 Epigastric pain: Secondary | ICD-10-CM | POA: Diagnosis not present

## 2016-10-08 DIAGNOSIS — K869 Disease of pancreas, unspecified: Secondary | ICD-10-CM | POA: Diagnosis not present

## 2016-10-08 DIAGNOSIS — N183 Chronic kidney disease, stage 3 (moderate): Secondary | ICD-10-CM

## 2016-10-08 DIAGNOSIS — I10 Essential (primary) hypertension: Secondary | ICD-10-CM

## 2016-10-08 DIAGNOSIS — J302 Other seasonal allergic rhinitis: Secondary | ICD-10-CM | POA: Diagnosis present

## 2016-10-08 DIAGNOSIS — J309 Allergic rhinitis, unspecified: Secondary | ICD-10-CM | POA: Diagnosis not present

## 2016-10-08 DIAGNOSIS — Z96652 Presence of left artificial knee joint: Secondary | ICD-10-CM | POA: Diagnosis not present

## 2016-10-08 DIAGNOSIS — E1122 Type 2 diabetes mellitus with diabetic chronic kidney disease: Secondary | ICD-10-CM | POA: Diagnosis not present

## 2016-10-08 DIAGNOSIS — G8929 Other chronic pain: Secondary | ICD-10-CM | POA: Diagnosis not present

## 2016-10-08 DIAGNOSIS — E1142 Type 2 diabetes mellitus with diabetic polyneuropathy: Secondary | ICD-10-CM

## 2016-10-08 DIAGNOSIS — K830 Cholangitis: Secondary | ICD-10-CM | POA: Diagnosis not present

## 2016-10-08 DIAGNOSIS — F419 Anxiety disorder, unspecified: Secondary | ICD-10-CM | POA: Diagnosis present

## 2016-10-08 DIAGNOSIS — K8689 Other specified diseases of pancreas: Secondary | ICD-10-CM | POA: Diagnosis not present

## 2016-10-08 DIAGNOSIS — K5710 Diverticulosis of small intestine without perforation or abscess without bleeding: Secondary | ICD-10-CM | POA: Diagnosis present

## 2016-10-08 DIAGNOSIS — C259 Malignant neoplasm of pancreas, unspecified: Secondary | ICD-10-CM | POA: Diagnosis not present

## 2016-10-08 DIAGNOSIS — E871 Hypo-osmolality and hyponatremia: Secondary | ICD-10-CM | POA: Diagnosis not present

## 2016-10-08 DIAGNOSIS — M545 Low back pain: Secondary | ICD-10-CM | POA: Diagnosis not present

## 2016-10-08 DIAGNOSIS — M1990 Unspecified osteoarthritis, unspecified site: Secondary | ICD-10-CM | POA: Diagnosis not present

## 2016-10-08 DIAGNOSIS — Z981 Arthrodesis status: Secondary | ICD-10-CM | POA: Diagnosis not present

## 2016-10-08 DIAGNOSIS — M797 Fibromyalgia: Secondary | ICD-10-CM | POA: Diagnosis not present

## 2016-10-08 DIAGNOSIS — E785 Hyperlipidemia, unspecified: Secondary | ICD-10-CM | POA: Diagnosis not present

## 2016-10-08 DIAGNOSIS — C258 Malignant neoplasm of overlapping sites of pancreas: Secondary | ICD-10-CM | POA: Diagnosis not present

## 2016-10-08 DIAGNOSIS — Z90710 Acquired absence of both cervix and uterus: Secondary | ICD-10-CM | POA: Diagnosis not present

## 2016-10-08 DIAGNOSIS — C250 Malignant neoplasm of head of pancreas: Secondary | ICD-10-CM | POA: Diagnosis not present

## 2016-10-08 DIAGNOSIS — K598 Other specified functional intestinal disorders: Secondary | ICD-10-CM | POA: Diagnosis present

## 2016-10-08 DIAGNOSIS — K831 Obstruction of bile duct: Secondary | ICD-10-CM | POA: Diagnosis not present

## 2016-10-08 DIAGNOSIS — E860 Dehydration: Secondary | ICD-10-CM | POA: Diagnosis present

## 2016-10-08 LAB — COMPREHENSIVE METABOLIC PANEL
ALBUMIN: 2.3 g/dL — AB (ref 3.5–5.0)
ALT: 28 U/L (ref 14–54)
ANION GAP: 9 (ref 5–15)
AST: 39 U/L (ref 15–41)
Alkaline Phosphatase: 259 U/L — ABNORMAL HIGH (ref 38–126)
BILIRUBIN TOTAL: 4.1 mg/dL — AB (ref 0.3–1.2)
BUN: 12 mg/dL (ref 6–20)
CO2: 21 mmol/L — ABNORMAL LOW (ref 22–32)
Calcium: 8.9 mg/dL (ref 8.9–10.3)
Chloride: 107 mmol/L (ref 101–111)
Creatinine, Ser: 1.12 mg/dL — ABNORMAL HIGH (ref 0.44–1.00)
GFR calc non Af Amer: 48 mL/min — ABNORMAL LOW (ref >60)
GFR, EST AFRICAN AMERICAN: 55 mL/min — AB (ref >60)
GLUCOSE: 56 mg/dL — AB (ref 65–99)
POTASSIUM: 3.5 mmol/L (ref 3.5–5.1)
Sodium: 137 mmol/L (ref 135–145)
TOTAL PROTEIN: 6 g/dL — AB (ref 6.5–8.1)

## 2016-10-08 LAB — CBC
HEMATOCRIT: 29.2 % — AB (ref 36.0–46.0)
HEMOGLOBIN: 9.7 g/dL — AB (ref 12.0–15.0)
MCH: 31.3 pg (ref 26.0–34.0)
MCHC: 33.2 g/dL (ref 30.0–36.0)
MCV: 94.2 fL (ref 78.0–100.0)
Platelets: 302 10*3/uL (ref 150–400)
RBC: 3.1 MIL/uL — AB (ref 3.87–5.11)
RDW: 13.8 % (ref 11.5–15.5)
WBC: 10.2 10*3/uL (ref 4.0–10.5)

## 2016-10-08 LAB — GLUCOSE, CAPILLARY
GLUCOSE-CAPILLARY: 55 mg/dL — AB (ref 65–99)
GLUCOSE-CAPILLARY: 96 mg/dL (ref 65–99)
GLUCOSE-CAPILLARY: 189 mg/dL — AB (ref 65–99)
GLUCOSE-CAPILLARY: 128 mg/dL — AB (ref 65–99)
Glucose-Capillary: 133 mg/dL — ABNORMAL HIGH (ref 65–99)
Glucose-Capillary: 287 mg/dL — ABNORMAL HIGH (ref 65–99)

## 2016-10-08 MED ORDER — DEXTROSE 50 % IV SOLN
INTRAVENOUS | Status: AC
Start: 2016-10-08 — End: 2016-10-08
  Filled 2016-10-08: qty 50

## 2016-10-08 MED ORDER — DEXTROSE 50 % IV SOLN
25.0000 mL | Freq: Once | INTRAVENOUS | Status: AC
  Administered 2016-10-08: 25 mL via INTRAVENOUS

## 2016-10-08 NOTE — Progress Notes (Signed)
PHARMACY NOTE -  ANTIBIOTIC RENAL DOSE ADJUSTMENT   Request received for Pharmacy to assist with antibiotic renal dose adjustment.  Patient has been initiated on Zosyn for cholangitis. SCr 1.17, estimated CrCl 77 ml/min Current dosage is appropriate and need for further dosage adjustment appears unlikely at present. Will sign off at this time.  Please reconsult if a change in clinical status warrants re-evaluation of dosage.  Thank you,  Minda Ditto PharmD Pager [phone] 10/08/2016, 1:30 PM

## 2016-10-08 NOTE — Progress Notes (Signed)
PROGRESS NOTE    Patricia Austin  GQQ:[id number] DOB: [birth year] DOA: 10/07/2016 PCP: Chevis Pretty, FNP   Brief Narrative: Patricia Austin is a 71 y.o. female with a history of diabetes mellitus, obesity, CKD stage III, pancreatic mass. She presented with abdominal pain. Gastroenterology to perform EUS on 4/19. Zosyn for concern of cholangitis.   Assessment & Plan:   Active Problems:   Diabetes ([place])   Morbid obesity (H[facility])   Hypertension   GERD (gastroesophageal reflux disease)   CKD (chronic kidney disease) stage 3, GFR 30-59 ml/min   Pancreatic mass   Biliary obstruction   Hyponatremia   Abdominal pain   Pancreatitic mass Biliary obstruction Cholangitis Afebrile. GI to perform EUS in AM. -continue IV Zosyn  Dehydration Hyponatremia Resolved with IV fluids  Essential hypertension -continue Norvasc and Coreg -continue to hold benazepril  Diabetes mellitus, type 2 -continue SSI  GERD -continue Protonix  Peripheral neuropathy -continue gabapentin   DVT prophylaxis: Heparin, SCDs Code Status: Full code Family Communication: Husband at baseline Disposition Plan: Discharge home in 1-2 days   Consultants:   Gastroenterology  Procedures:   None  Antimicrobials:  Zosyn    Subjective: Patient reports improvement in pain.  Objective: Vitals:   10/07/16 1400 10/07/16 2145 10/08/16 0452 10/08/16 0500  BP: (!) 172/71 (!) 131/47 (!) 148/69   Pulse: (!) 107 76 78   Resp: (!) 22 20 20    Temp: 99.3 F (37.4 C) 99.1 F (37.3 C) 98.7 F (37.1 C)   TempSrc: Oral Oral Oral   SpO2:  98% 95%   Weight:    (!) 189.5 kg (417 lb 12.4 oz)  Height:        Intake/Output Summary (Last 24 hours) at 10/08/16 1307 Last data filed at 10/08/16 1100  Gross per 24 hour  Intake          2938.33 ml  Output              120 ml  Net          2818.33 ml   Filed Weights   10/07/16 0005 10/08/16 0500  Weight: 82.1 kg (181 lb) (!) 189.5 kg (417 lb 12.4 oz)     Examination:  General exam: Appears calm and comfortable Respiratory system: Clear to auscultation. Respiratory effort normal. Cardiovascular system: S1 & S2 heard, RRR. No murmurs, rubs, gallops or clicks. Gastrointestinal system: Abdomen is nondistended, soft and nontender. Drain in place. Normal bowel sounds heard. Central nervous system: Alert and oriented. No focal neurological deficits. Extremities: No edema. No calf tenderness Skin: No cyanosis. No rashes Psychiatry: Judgement and insight appear normal. Mood & affect appropriate.     Data Reviewed: I have personally reviewed following labs and imaging studies  CBC:  Recent Labs Lab 10/02/16 0403 10/07/16 0135 10/08/16 0703  WBC 6.8 17.6* 10.2  HGB 8.9* 12.0 9.7*  HCT 26.9* 35.3* 29.2*  MCV 97.8 93.4 94.2  PLT 212 373 671   Basic Metabolic Panel:  Recent Labs Lab 10/02/16 0403 10/03/16 1256 10/07/16 0135 10/08/16 0703  NA 137 138 129* 137  K 4.4 5.1 4.2 3.5  CL 108 101 94* 107  CO2 21* 21 25 21*  GLUCOSE 265* 399* 412* 56*  BUN 31* 21 16 12   CREATININE 1.92* 1.53* 1.17* 1.12*  CALCIUM 8.5* 9.5 9.7 8.9   GFR: Estimated Creatinine Clearance: 76.8 mL/min (A) (by C-G formula based on SCr of 1.12 mg/dL (H)). Liver Function Tests:  Recent Labs Lab 10/02/16  0403 10/03/16 1256 10/07/16 0135 10/08/16 0703  AST 29 29 43* 39  ALT 25 25 32 28  ALKPHOS 271* 353* 333* 259*  BILITOT 2.5* 2.6* 4.3* 4.1*  PROT 5.8* 6.4 7.7 6.0*  ALBUMIN 2.2* 3.5 3.1* 2.3*    Recent Labs Lab 10/07/16 0135  LIPASE 78*   No results for input(s): AMMONIA in the last 168 hours. Coagulation Profile: No results for input(s): INR, PROTIME in the last 168 hours. Cardiac Enzymes: No results for input(s): CKTOTAL, CKMB, CKMBINDEX, TROPONINI in the last 168 hours. BNP (last 3 results) No results for input(s): PROBNP in the last 8760 hours. HbA1C: No results for input(s): HGBA1C in the last 72 hours. CBG:  Recent Labs Lab  10/07/16 1713 10/07/16 2144 10/08/16 0734 10/08/16 0848 10/08/16 1159  GLUCAP 169* 133* 55* 96 287*   Lipid Profile: No results for input(s): CHOL, HDL, LDLCALC, TRIG, CHOLHDL, LDLDIRECT in the last 72 hours. Thyroid Function Tests: No results for input(s): TSH, T4TOTAL, FREET4, T3FREE, THYROIDAB in the last 72 hours. Anemia Panel: No results for input(s): VITAMINB12, FOLATE, FERRITIN, TIBC, IRON, RETICCTPCT in the last 72 hours. Sepsis Labs: No results for input(s): PROCALCITON, LATICACIDVEN in the last 168 hours.  Recent Results (from the past 240 hour(s))  Culture, blood (routine x 2)     Status: None   Collection Time: 09/29/16 10:56 PM  Result Value Ref Range Status   Specimen Description BLOOD LEFT ANTECUBITAL  Final   Special Requests   Final    BOTTLES DRAWN AEROBIC AND ANAEROBIC Blood Culture results may not be optimal due to an excessive volume of blood received in culture bottles   Culture   Final    NO GROWTH 5 DAYS Performed at [facility] [address], [place], [place] [address]    Report Status 10/05/2016 FINAL  Final  Culture, blood (routine x 2)     Status: None   Collection Time: 09/29/16 10:56 PM  Result Value Ref Range Status   Specimen Description BLOOD LEFT HAND  Final   Special Requests   Final    BOTTLES DRAWN AEROBIC ONLY Blood Culture adequate volume   Culture   Final    NO GROWTH 5 DAYS Performed at [facility], [place] [address], [place], [place] [address]    Report Status 10/05/2016 FINAL  Final  Culture, Urine     Status: None   Collection Time: 09/30/16 10:19 PM  Result Value Ref Range Status   Specimen Description URINE, RANDOM  Final   Special Requests NONE  Final   Culture   Final    NO GROWTH Performed at [facility], [place] [address] [place], [place] [address]    Report Status 10/02/2016 FINAL  Final         Radiology Studies: Ct Abdomen Pelvis W Contrast  Result Date: 10/07/2016 CLINICAL DATA:   Worsening generalized abdominal pain for 1 week. History of pancreatic mass post cholangiogram with biliary drainage placement. Draining green fluid. White cell count 17.6. Elevated lipase. History of hypertension and diabetes. EXAM: CT ABDOMEN AND PELVIS WITH CONTRAST TECHNIQUE: Multidetector CT imaging of the abdomen and pelvis was performed using the standard protocol following bolus administration of intravenous contrast. CONTRAST:  80 mL Isovue-300 COMPARISON:  09/20/2016 FINDINGS: Lower chest: Calcified granulomas in the left lung base. Nodule in the right lung base measuring 5 mm diameter, unchanged. Distal esophageal wall is thickened possibly indicating reflux or esophagitis. Hepatobiliary: Scattered calcified granulomas  in the liver. Mild intrahepatic bile duct dilatation. Biliary drainage catheter is present, injuring through the left lobe of the liver. Distal catheter is demonstrated in the lower common bile duct. Cholelithiasis with a large stone in the gallbladder. Small amount of gas in the gallbladder is likely related to catheter insertion. Pancreas: There is a mass in the head and uncinate process of the pancreas measuring up to 4 x 4.2 cm diameter. Central necrosis is present. The mass is causing obstruction of the distal common bile duct. Mass is contiguous with the duodenum and erect duodenal invasion is not excluded. The mass abuts the superior mesenteric artery and vein without circumferential involvement. No pancreatic ductal dilatation. Spleen: Calcified granulomas in the spleen. Adrenals/Urinary Tract: No adrenal gland nodules. Renal nephrograms are homogeneous and symmetrical. Small subcentimeter cysts. No hydronephrosis or hydroureter. Bladder wall is not thickened. Stomach/Bowel: Stomach and small bowel are decompressed. Scattered gas and stool in the colon. No colonic distention or inflammation. Scattered colonic diverticula. Appendix is not identified. Vascular/Lymphatic: Aortic  atherosclerosis. No enlarged abdominal or pelvic lymph nodes. Reproductive: Status post hysterectomy. No adnexal masses. Other: No free air or free fluid in the abdomen. Abdominal wall musculature appears intact. Musculoskeletal: Degenerative changes in the lumbar spine. Postoperative changes with laminectomies, posterior rod and screw fixation, and intervertebral disc prostheses from L3 through L5. IMPRESSION: Mass in the head and uncinate process of the pancreas is again demonstrated, with associated obstruction of the distal common bile duct. Biliary drainage catheter is in place with tip in the distal common bile duct. Mild residual intrahepatic bile duct dilatation. Cholelithiasis. Calcified granulomas. Right lower lung nodules measuring up to 5 mm without change. Esophageal wall thickening possibly due to reflux or esophagitis. Electronically Signed   By: Lucienne Capers M.D.   On: 10/07/2016 06:32        Scheduled Meds: . amLODipine  5 mg Oral QHS  . carvedilol  25 mg Oral BID WC  . colestipol  5 g Oral 2 times per day  . DULoxetine  60 mg Oral Daily  . gabapentin  300 mg Oral QHS  . insulin aspart  0-15 Units Subcutaneous TID WC  . insulin glargine  40 Units Subcutaneous QHS  . mouth rinse  15 mL Mouth Rinse BID  . pantoprazole (PROTONIX) IV  40 mg Intravenous Q12H   Continuous Infusions: . sodium chloride 100 mL/hr at 10/08/16 0612  . piperacillin-tazobactam (ZOSYN)  IV 3.375 g (10/08/16 0939)     LOS: 0 days     Cordelia Poche, MD [facility] 10/08/2016, 1:07 PM Pager: [phone]  If 7PM-7AM, please contact night-coverage [email or web address] Password [email or web address] 10/08/2016, 1:07 PM 1

## 2016-10-08 NOTE — Progress Notes (Signed)
Inpatient Diabetes Program Recommendations  AACE/ADA: New Consensus Statement on Inpatient Glycemic Control (2015)  Target Ranges:  Prepandial:   less than 140 mg/dL      Peak postprandial:   less than 180 mg/dL (1-2 hours)      Critically ill patients:  140 - 180 mg/dL   Lab Results  Component Value Date   GLUCAP 287 (H) 10/08/2016   HGBA1C 10.5 (H) 08/12/2016    Review of Glycemic Control  Hypoglycemia this am.   Inpatient Diabetes Program Recommendations:    Decrease Lantus to 30 units QHS Add Novolog 3 units tidwc for meal coverage insulin. Change diet to CHO mod medium.  Will follow closely.  Thank you. Lorenda Peck, RD, LDN, CDE Inpatient Diabetes Coordinator [phone]

## 2016-10-08 NOTE — Progress Notes (Signed)
Progress Note   Subjective  Chief Complaint: epigastric Abdominal pain,  Add'l Dx: vomiting, leukocytosis, status post biliary drain placement, pancreatic mass  This morning the past patient is found sitting in bed, she reports doing fairly well. She is aware of recommendations for EUS tomorrow which is still on the schedule as her white count has come down. She does ask for some regular food today. She denies any new complaints.  Still with copious dark green bile in drainage bag.  ROS:  No chest pain or dyspnea  Objective   Vital signs in last 24 hours: Temp:  [98.7 F (37.1 C)-99.3 F (37.4 C)] 98.7 F (37.1 C) (04/18 0452) Pulse Rate:  [76-107] 78 (04/18 0452) Resp:  [20-22] 20 (04/18 0452) BP: (131-172)/(47-71) 148/69 (04/18 0452) SpO2:  [95 %-98 %] 95 % (04/18 0452) Weight:  [417 lb 12.4 oz (189.5 kg)] 417 lb 12.4 oz (189.5 kg) (04/18 0500) Last BM Date: 10/06/16 General: Caucasian female in NAD Heart:  Regular rate and rhythm; no murmurs Lungs: Respirations even and unlabored, lungs CTA bilaterally Abdomen:  Soft, mild generalized tenderness and nondistended. Normal bowel sounds. PTC drain in place, greenish fluid discharge Extremities:  Without edema. Neurologic:  Alert and oriented,  grossly normal neurologically. Psych:  Cooperative. Normal mood and affect.  Intake/Output from previous day: 04/17 0701 - 04/18 0700 In: 2098.3 [P.O.:360; I.V.:1688.3; IV Piggyback:50] Out: 120 [Drains:120]  Lab Results:  Recent Labs  10/07/16 0135 10/08/16 0703  WBC 17.6* 10.2  HGB 12.0 9.7*  HCT 35.3* 29.2*  PLT 373 302   BMET  Recent Labs  10/07/16 0135 10/08/16 0703  NA 129* 137  K 4.2 3.5  CL 94* 107  CO2 25 21*  GLUCOSE 412* 56*  BUN 16 12  CREATININE 1.17* 1.12*  CALCIUM 9.7 8.9   LFT  Recent Labs  10/08/16 0703  PROT 6.0*  ALBUMIN 2.3*  AST 39  ALT 28  ALKPHOS 259*  BILITOT 4.1*    Studies/Results: Ct Abdomen Pelvis W Contrast  Result  Date: 10/07/2016 CLINICAL DATA:  Worsening generalized abdominal pain for 1 week. History of pancreatic mass post cholangiogram with biliary drainage placement. Draining green fluid. White cell count 17.6. Elevated lipase. History of hypertension and diabetes. EXAM: CT ABDOMEN AND PELVIS WITH CONTRAST TECHNIQUE: Multidetector CT imaging of the abdomen and pelvis was performed using the standard protocol following bolus administration of intravenous contrast. CONTRAST:  80 mL Isovue-300 COMPARISON:  09/20/2016 FINDINGS: Lower chest: Calcified granulomas in the left lung base. Nodule in the right lung base measuring 5 mm diameter, unchanged. Distal esophageal wall is thickened possibly indicating reflux or esophagitis. Hepatobiliary: Scattered calcified granulomas in the liver. Mild intrahepatic bile duct dilatation. Biliary drainage catheter is present, injuring through the left lobe of the liver. Distal catheter is demonstrated in the lower common bile duct. Cholelithiasis with a large stone in the gallbladder. Small amount of gas in the gallbladder is likely related to catheter insertion. Pancreas: There is a mass in the head and uncinate process of the pancreas measuring up to 4 x 4.2 cm diameter. Central necrosis is present. The mass is causing obstruction of the distal common bile duct. Mass is contiguous with the duodenum and erect duodenal invasion is not excluded. The mass abuts the superior mesenteric artery and vein without circumferential involvement. No pancreatic ductal dilatation. Spleen: Calcified granulomas in the spleen. Adrenals/Urinary Tract: No adrenal gland nodules. Renal nephrograms are homogeneous and symmetrical. Small subcentimeter cysts. No hydronephrosis  or hydroureter. Bladder wall is not thickened. Stomach/Bowel: Stomach and small bowel are decompressed. Scattered gas and stool in the colon. No colonic distention or inflammation. Scattered colonic diverticula. Appendix is not identified.  Vascular/Lymphatic: Aortic atherosclerosis. No enlarged abdominal or pelvic lymph nodes. Reproductive: Status post hysterectomy. No adnexal masses. Other: No free air or free fluid in the abdomen. Abdominal wall musculature appears intact. Musculoskeletal: Degenerative changes in the lumbar spine. Postoperative changes with laminectomies, posterior rod and screw fixation, and intervertebral disc prostheses from L3 through L5. IMPRESSION: Mass in the head and uncinate process of the pancreas is again demonstrated, with associated obstruction of the distal common bile duct. Biliary drainage catheter is in place with tip in the distal common bile duct. Mild residual intrahepatic bile duct dilatation. Cholelithiasis. Calcified granulomas. Right lower lung nodules measuring up to 5 mm without change. Esophageal wall thickening possibly due to reflux or esophagitis. Electronically Signed   By: Lucienne Capers M.D.   On: 10/07/2016 06:32    Assessment / Plan:    Assessment: 1. 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 Ardis Hughs  Plan: 1. Continue IV antibiotics and fluid support 2. We will proceed with EUS scheduled tomorrow with Dr. Ardis Hughs. Reviewed risks, benefits, limitations and alternatives and the patient agrees to proceed 3. Repeat PT/INR has been ordered for tomorrow morning 4. Heparin was discontinued for procedure tomorrow 5. Patient was placed on a regular food diet now and nothing by mouth after midnight 6. Encouraged patient to keep her SCDs on  to prevent any blood clots 7. Did see the patient this morning with Dr. Loletha Carrow, if you have any further questions today please feel free to contact me  Thank you for your kind consultation, we will continue to follow.   LOS: 0 days   Levin Erp  10/08/2016, 9:43 AM  Pager # [phone]  I have discussed the case with the PA, and that is the plan I formulated. I personally interviewed and examined the patient.   Dr Ardis Hughs was kind enough to review our consult note in the CT scan. He is planning to proceed with EUS as scheduled tomorrow, as long as the patient does not have evidence of worsening infection or other clinical decompensation before then.  After the EUS with biopsy and staging of this mass, we will then need to address management of this probable pancreatic malignancy. Naturally, she will need oncology evaluation and then consideration of converting her external biliary drain to a metal stent once we are sure that her infection has cleared.   Nelida Meuse III Pager [phone]  Mon-Fri 8a-5p [phone] after 5p, weekends, holidays

## 2016-10-09 ENCOUNTER — Inpatient Hospital Stay (HOSPITAL_COMMUNITY): Payer: Medicare Other

## 2016-10-09 ENCOUNTER — Encounter (HOSPITAL_COMMUNITY): Payer: Self-pay | Admitting: *Deleted

## 2016-10-09 ENCOUNTER — Encounter (HOSPITAL_COMMUNITY)
Admission: EM | Disposition: A | Discharge status: Home / Self Care | Payer: Self-pay | Source: Home / Self Care | Attending: Family Medicine

## 2016-10-09 ENCOUNTER — Ambulatory Visit (HOSPITAL_COMMUNITY): Admission: RE | Admit: 2016-10-09 | Payer: Medicare Other | Source: Ambulatory Visit | Admitting: Gastroenterology

## 2016-10-09 ENCOUNTER — Inpatient Hospital Stay (HOSPITAL_COMMUNITY): Payer: Medicare Other | Admitting: Certified Registered"

## 2016-10-09 HISTORY — PX: IR BILIARY STENT(S) EXISTING ACCESS INC DILATION CATH EXCHANGE: IMG6048

## 2016-10-09 HISTORY — PX: EUS: SHX5427

## 2016-10-09 LAB — CBC
HCT: 26.5 % — ABNORMAL LOW (ref 36.0–46.0)
Hemoglobin: 9 g/dL — ABNORMAL LOW (ref 12.0–15.0)
MCH: 32.4 pg (ref 26.0–34.0)
MCHC: 34 g/dL (ref 30.0–36.0)
MCV: 95.3 fL (ref 78.0–100.0)
PLATELETS: 265 10*3/uL (ref 150–400)
RBC: 2.78 MIL/uL — AB (ref 3.87–5.11)
RDW: 13.7 % (ref 11.5–15.5)
WBC: 6.7 10*3/uL (ref 4.0–10.5)

## 2016-10-09 LAB — GLUCOSE, CAPILLARY
GLUCOSE-CAPILLARY: 77 mg/dL (ref 65–99)
GLUCOSE-CAPILLARY: 128 mg/dL — AB (ref 65–99)
Glucose-Capillary: 104 mg/dL — ABNORMAL HIGH (ref 65–99)

## 2016-10-09 LAB — PROTIME-INR
INR: 1.03
PROTHROMBIN TIME: 13.6 s (ref 11.4–15.2)

## 2016-10-09 SURGERY — UPPER ENDOSCOPIC ULTRASOUND (EUS) LINEAR
Anesthesia: General

## 2016-10-09 MED ORDER — ONDANSETRON HCL 4 MG/2ML IJ SOLN
INTRAMUSCULAR | Status: DC | PRN
  Administered 2016-10-09: 4 mg via INTRAVENOUS

## 2016-10-09 MED ORDER — IOPAMIDOL (ISOVUE-300) INJECTION 61%: 50.0000 mL | Freq: Once | INTRAVENOUS | Status: DC | PRN

## 2016-10-09 MED ORDER — LIDOCAINE HCL 1 % IJ SOLN
INTRAMUSCULAR | Status: AC
  Filled 2016-10-09: qty 20

## 2016-10-09 MED ORDER — GLUCAGON HCL RDNA (DIAGNOSTIC) 1 MG IJ SOLR
INTRAMUSCULAR | Status: AC
Start: 2016-10-09 — End: 2016-10-09
  Filled 2016-10-09: qty 1

## 2016-10-09 MED ORDER — MORPHINE SULFATE (PF) 4 MG/ML IV SOLN
2.0000 mg | INTRAVENOUS | Status: DC | PRN
  Administered 2016-10-09 – 2016-10-11 (×4): 2 mg via INTRAVENOUS
  Filled 2016-10-09 (×4): qty 1

## 2016-10-09 MED ORDER — PROPOFOL 10 MG/ML IV BOLUS
INTRAVENOUS | Status: AC
  Filled 2016-10-09: qty 20

## 2016-10-09 MED ORDER — SODIUM CHLORIDE 0.9 % IV SOLN: INTRAVENOUS | Status: DC

## 2016-10-09 MED ORDER — FENTANYL CITRATE (PF) 100 MCG/2ML IJ SOLN
50.0000 ug | Freq: Once | INTRAMUSCULAR | Status: AC
  Administered 2016-10-09: 50 ug via INTRAVENOUS

## 2016-10-09 MED ORDER — PHENYLEPHRINE 40 MCG/ML (10ML) SYRINGE FOR IV PUSH (FOR BLOOD PRESSURE SUPPORT)
PREFILLED_SYRINGE | INTRAVENOUS | Status: AC
Start: 2016-10-09 — End: 2016-10-09
  Filled 2016-10-09: qty 10

## 2016-10-09 MED ORDER — GLUCAGON HCL RDNA (DIAGNOSTIC) 1 MG IJ SOLR
INTRAMUSCULAR | Status: DC | PRN
  Administered 2016-10-09: .5 mg via INTRAVENOUS

## 2016-10-09 MED ORDER — DIPHENHYDRAMINE HCL 25 MG PO CAPS
25.0000 mg | ORAL_CAPSULE | Freq: Four times a day (QID) | ORAL | Status: DC | PRN
  Administered 2016-10-09: 25 mg via ORAL
  Filled 2016-10-09 (×2): qty 1

## 2016-10-09 MED ORDER — PROPOFOL 10 MG/ML IV BOLUS
INTRAVENOUS | Status: AC
  Filled 2016-10-09: qty 40

## 2016-10-09 MED ORDER — LACTATED RINGERS IV SOLN
INTRAVENOUS | Status: DC
  Administered 2016-10-09: 12:00:00 via INTRAVENOUS
  Administered 2016-10-09: 1000 mL via INTRAVENOUS

## 2016-10-09 MED ORDER — PROPOFOL 10 MG/ML IV BOLUS
INTRAVENOUS | Status: DC | PRN
  Administered 2016-10-09 (×14): 40 mg via INTRAVENOUS

## 2016-10-09 MED ORDER — LIP MEDEX EX OINT: TOPICAL_OINTMENT | CUTANEOUS | Status: DC | PRN

## 2016-10-09 MED ORDER — LIDOCAINE 2% (20 MG/ML) 5 ML SYRINGE
INTRAMUSCULAR | Status: DC | PRN
  Administered 2016-10-09: 40 mg via INTRAVENOUS

## 2016-10-09 MED ORDER — IOPAMIDOL (ISOVUE-300) INJECTION 61%
INTRAVENOUS | Status: AC
  Filled 2016-10-09: qty 50

## 2016-10-09 MED ORDER — LIDOCAINE HCL 1 % IJ SOLN
INTRAMUSCULAR | Status: DC | PRN
  Administered 2016-10-09: 10 mL

## 2016-10-09 MED ORDER — ONDANSETRON HCL 4 MG/2ML IJ SOLN
INTRAMUSCULAR | Status: AC
  Filled 2016-10-09: qty 2

## 2016-10-09 MED ORDER — FENTANYL CITRATE (PF) 100 MCG/2ML IJ SOLN
INTRAMUSCULAR | Status: AC
  Administered 2016-10-09: 50 ug via INTRAVENOUS
  Filled 2016-10-09: qty 2

## 2016-10-09 MED ORDER — IOPAMIDOL (ISOVUE-300) INJECTION 61%
50.0000 mL | Freq: Once | INTRAVENOUS | Status: AC | PRN
  Administered 2016-10-09: 30 mL

## 2016-10-09 NOTE — Progress Notes (Signed)
PROGRESS NOTE    Patricia Austin  ZYS:[id number] DOB: 03-15-1946 DOA: 10/07/2016 PCP: Chevis Pretty, FNP   Brief Narrative: Patricia Austin is a 71 y.o. female with a history of diabetes mellitus, obesity, CKD stage III, pancreatic mass. She presented with abdominal pain. Gastroenterology to perform EUS on 4/19. Zosyn for concern of cholangitis.   Assessment & Plan:   Active Problems:   Diabetes ([place])   Morbid obesity ([facility])   Hypertension   GERD (gastroesophageal reflux disease)   CKD (chronic kidney disease) stage 3, GFR 30-59 ml/min   Pancreatic mass   Biliary obstruction   Hyponatremia   Abdominal pain   Pancreatitic mass Biliary obstruction Cholangitis Afebrile. GI to perform EUS today; patient is NPO. -continue IV Zosyn  Dehydration Hyponatremia Resolved with IV fluids  Essential hypertension -continue Norvasc and Coreg -continue to hold benazepril  Diabetes mellitus, type 2 -continue SSI  GERD -continue Protonix  Peripheral neuropathy -continue gabapentin   DVT prophylaxis: SCDs Code Status: Full code Family Communication: Husband at baseline Disposition Plan: Discharge home in 1-2 days   Consultants:   Gastroenterology  Procedures:   None  Antimicrobials:  Zosyn   Subjective: Patient reports improvement in pain. Awaiting EUS today. No new complaints.  Objective: Vitals:   10/08/16 0500 10/08/16 1408 10/08/16 2053 10/09/16 0455  BP:  (!) 142/67 (!) 151/65 (!) 161/79  Pulse:  79 89 90  Resp:  20 18 18   Temp:  98.1 F (36.7 C) 98.8 F (37.1 C) 98.3 F (36.8 C)  TempSrc:  Oral Oral Oral  SpO2:  97% 96% 96%  Weight: (!) 189.5 kg (417 lb 12.4 oz)     Height:        Intake/Output Summary (Last 24 hours) at 10/09/16 1059 Last data filed at 10/09/16 0631  Gross per 24 hour  Intake          2821.67 ml  Output              420 ml  Net          2401.67 ml   Filed Weights   10/07/16 0005 10/08/16 0500  Weight: 82.1 kg (181 lb)  (!) 189.5 kg (417 lb 12.4 oz)    Examination:  General exam: Appears calm and comfortable Respiratory system: Clear to auscultation. Respiratory effort normal. Cardiovascular system: S1 & S2 heard, RRR. No murmurs. Gastrointestinal system: Abdomen is nondistended, soft and nontender. Drain in place. Normal bowel sounds heard. Central nervous system: Alert and oriented. No focal neurological deficits. Extremities: No edema. No calf tenderness Skin: No cyanosis. No rashes Psychiatry: Judgement and insight appear normal. Mood & affect appropriate.     Data Reviewed: I have personally reviewed following labs and imaging studies  CBC:  Recent Labs Lab 10/07/16 0135 10/08/16 0703 10/09/16 0609  WBC 17.6* 10.2 6.7  HGB 12.0 9.7* 9.0*  HCT 35.3* 29.2* 26.5*  MCV 93.4 94.2 95.3  PLT 373 302 932   Basic Metabolic Panel:  Recent Labs Lab 10/03/16 1256 10/07/16 0135 10/08/16 0703  NA 138 129* 137  K 5.1 4.2 3.5  CL 101 94* 107  CO2 21 25 21*  GLUCOSE 399* 412* 56*  BUN 21 16 12   CREATININE 1.53* 1.17* 1.12*  CALCIUM 9.5 9.7 8.9   GFR: Estimated Creatinine Clearance: 76.8 mL/min (A) (by C-G formula based on SCr of 1.12 mg/dL (H)).   Liver Function Tests:  Recent Labs Lab 10/03/16 1256 10/07/16 0135 10/08/16 0703  AST 29  43* 39  ALT 25 32 28  ALKPHOS 353* 333* 259*  BILITOT 2.6* 4.3* 4.1*  PROT 6.4 7.7 6.0*  ALBUMIN 3.5 3.1* 2.3*    Recent Labs Lab 10/07/16 0135  LIPASE 78*   No results for input(s): AMMONIA in the last 168 hours. Coagulation Profile:  Recent Labs Lab 10/09/16 0609  INR 1.03   Cardiac Enzymes: No results for input(s): CKTOTAL, CKMB, CKMBINDEX, TROPONINI in the last 168 hours. BNP (last 3 results) No results for input(s): PROBNP in the last 8760 hours. HbA1C: No results for input(s): HGBA1C in the last 72 hours. CBG:  Recent Labs Lab 10/08/16 0848 10/08/16 1159 10/08/16 1632 10/08/16 2202 10/09/16 0737  GLUCAP 96 287* 189*  128* 104*   Lipid Profile: No results for input(s): CHOL, HDL, LDLCALC, TRIG, CHOLHDL, LDLDIRECT in the last 72 hours. Thyroid Function Tests: No results for input(s): TSH, T4TOTAL, FREET4, T3FREE, THYROIDAB in the last 72 hours. Anemia Panel: No results for input(s): VITAMINB12, FOLATE, FERRITIN, TIBC, IRON, RETICCTPCT in the last 72 hours. Sepsis Labs: No results for input(s): PROCALCITON, LATICACIDVEN in the last 168 hours.  Recent Results (from the past 240 hour(s))  Culture, blood (routine x 2)     Status: None   Collection Time: 09/29/16 10:56 PM  Result Value Ref Range Status   Specimen Description BLOOD LEFT ANTECUBITAL  Final   Special Requests   Final    BOTTLES DRAWN AEROBIC AND ANAEROBIC Blood Culture results may not be optimal due to an excessive volume of blood received in culture bottles   Culture   Final    NO GROWTH 5 DAYS Performed at [facility] [address], [place], [place] [address]    Report Status 10/05/2016 FINAL  Final  Culture, blood (routine x 2)     Status: None   Collection Time: 09/29/16 10:56 PM  Result Value Ref Range Status   Specimen Description BLOOD LEFT HAND  Final   Special Requests   Final    BOTTLES DRAWN AEROBIC ONLY Blood Culture adequate volume   Culture   Final    NO GROWTH 5 DAYS Performed at [facility], [place] [address], [place], [place] [address]    Report Status 10/05/2016 FINAL  Final  Culture, Urine     Status: None   Collection Time: 09/30/16 10:19 PM  Result Value Ref Range Status   Specimen Description URINE, RANDOM  Final   Special Requests NONE  Final   Culture   Final    NO GROWTH Performed at [facility], [place] [address] [place], [place] [address]    Report Status 10/02/2016 FINAL  Final  Blood culture (routine x 2)     Status: None (Preliminary result)   Collection Time: 10/07/16  9:30 AM  Result Value Ref Range Status   Specimen Description BLOOD RIGHT ARM  Final   Special Requests    Final    BOTTLES DRAWN AEROBIC AND ANAEROBIC Blood Culture adequate volume   Culture   Final    NO GROWTH 1 DAY Performed at [facility], [place] [address], [place], [place] [address]    Report Status PENDING  Incomplete  Blood culture (routine x 2)     Status: None (Preliminary result)   Collection Time: 10/07/16  9:31 AM  Result Value Ref Range Status   Specimen Description BLOOD LEFT HAND  Final   Special Requests IN PEDIATRIC BOTTLE Blood Culture adequate volume  Final  Culture   Final    NO GROWTH 1 DAY Performed at [facility], [place] [address], [place], [place] [address]    Report Status PENDING  Incomplete         Radiology Studies: No results found.      Scheduled Meds: . amLODipine  5 mg Oral QHS  . carvedilol  25 mg Oral BID WC  . colestipol  5 g Oral 2 times per day  . DULoxetine  60 mg Oral Daily  . gabapentin  300 mg Oral QHS  . insulin aspart  0-15 Units Subcutaneous TID WC  . insulin glargine  40 Units Subcutaneous QHS  . mouth rinse  15 mL Mouth Rinse BID  . pantoprazole (PROTONIX) IV  40 mg Intravenous Q12H   Continuous Infusions: . sodium chloride 100 mL/hr at 10/09/16 0450  . piperacillin-tazobactam (ZOSYN)  IV Stopped (10/09/16 0630)     LOS: 1 day     Cordelia Poche, MD [facility] 10/09/2016, 10:59 AM Pager: [phone]  If 7PM-7AM, please contact night-coverage [email or web address] Password [email or web address] 10/09/2016, 10:59 AM 1

## 2016-10-09 NOTE — H&P (View-Only) (Signed)
Progress Note   Subjective  Chief Complaint: epigastric Abdominal pain,  Add'l Dx: vomiting, leukocytosis, status post biliary drain placement, pancreatic mass  This morning the past patient is found sitting in bed, she reports doing fairly well. She is aware of recommendations for EUS tomorrow which is still on the schedule as her white count has come down. She does ask for some regular food today. She denies any new complaints.  Still with copious dark green bile in drainage bag.  ROS:  No chest pain or dyspnea  Objective   Vital signs in last 24 hours: Temp:  [98.7 F (37.1 C)-99.3 F (37.4 C)] 98.7 F (37.1 C) (04/18 0452) Pulse Rate:  [76-107] 78 (04/18 0452) Resp:  [20-22] 20 (04/18 0452) BP: (131-172)/(47-71) 148/69 (04/18 0452) SpO2:  [95 %-98 %] 95 % (04/18 0452) Weight:  [417 lb 12.4 oz (189.5 kg)] 417 lb 12.4 oz (189.5 kg) (04/18 0500) Last BM Date: 10/06/16 General: Caucasian female in NAD Heart:  Regular rate and rhythm; no murmurs Lungs: Respirations even and unlabored, lungs CTA bilaterally Abdomen:  Soft, mild generalized tenderness and nondistended. Normal bowel sounds. PTC drain in place, greenish fluid discharge Extremities:  Without edema. Neurologic:  Alert and oriented,  grossly normal neurologically. Psych:  Cooperative. Normal mood and affect.  Intake/Output from previous day: 04/17 0701 - 04/18 0700 In: 2098.3 [P.O.:360; I.V.:1688.3; IV Piggyback:50] Out: 120 [Drains:120]  Lab Results:  Recent Labs  10/07/16 0135 10/08/16 0703  WBC 17.6* 10.2  HGB 12.0 9.7*  HCT 35.3* 29.2*  PLT 373 302   BMET  Recent Labs  10/07/16 0135 10/08/16 0703  NA 129* 137  K 4.2 3.5  CL 94* 107  CO2 25 21*  GLUCOSE 412* 56*  BUN 16 12  CREATININE 1.17* 1.12*  CALCIUM 9.7 8.9   LFT  Recent Labs  10/08/16 0703  PROT 6.0*  ALBUMIN 2.3*  AST 39  ALT 28  ALKPHOS 259*  BILITOT 4.1*    Studies/Results: Ct Abdomen Pelvis W Contrast  Result  Date: 10/07/2016 CLINICAL DATA:  Worsening generalized abdominal pain for 1 week. History of pancreatic mass post cholangiogram with biliary drainage placement. Draining green fluid. White cell count 17.6. Elevated lipase. History of hypertension and diabetes. EXAM: CT ABDOMEN AND PELVIS WITH CONTRAST TECHNIQUE: Multidetector CT imaging of the abdomen and pelvis was performed using the standard protocol following bolus administration of intravenous contrast. CONTRAST:  80 mL Isovue-300 COMPARISON:  09/20/2016 FINDINGS: Lower chest: Calcified granulomas in the left lung base. Nodule in the right lung base measuring 5 mm diameter, unchanged. Distal esophageal wall is thickened possibly indicating reflux or esophagitis. Hepatobiliary: Scattered calcified granulomas in the liver. Mild intrahepatic bile duct dilatation. Biliary drainage catheter is present, injuring through the left lobe of the liver. Distal catheter is demonstrated in the lower common bile duct. Cholelithiasis with a large stone in the gallbladder. Small amount of gas in the gallbladder is likely related to catheter insertion. Pancreas: There is a mass in the head and uncinate process of the pancreas measuring up to 4 x 4.2 cm diameter. Central necrosis is present. The mass is causing obstruction of the distal common bile duct. Mass is contiguous with the duodenum and erect duodenal invasion is not excluded. The mass abuts the superior mesenteric artery and vein without circumferential involvement. No pancreatic ductal dilatation. Spleen: Calcified granulomas in the spleen. Adrenals/Urinary Tract: No adrenal gland nodules. Renal nephrograms are homogeneous and symmetrical. Small subcentimeter cysts. No hydronephrosis  or hydroureter. Bladder wall is not thickened. Stomach/Bowel: Stomach and small bowel are decompressed. Scattered gas and stool in the colon. No colonic distention or inflammation. Scattered colonic diverticula. Appendix is not identified.  Vascular/Lymphatic: Aortic atherosclerosis. No enlarged abdominal or pelvic lymph nodes. Reproductive: Status post hysterectomy. No adnexal masses. Other: No free air or free fluid in the abdomen. Abdominal wall musculature appears intact. Musculoskeletal: Degenerative changes in the lumbar spine. Postoperative changes with laminectomies, posterior rod and screw fixation, and intervertebral disc prostheses from L3 through L5. IMPRESSION: Mass in the head and uncinate process of the pancreas is again demonstrated, with associated obstruction of the distal common bile duct. Biliary drainage catheter is in place with tip in the distal common bile duct. Mild residual intrahepatic bile duct dilatation. Cholelithiasis. Calcified granulomas. Right lower lung nodules measuring up to 5 mm without change. Esophageal wall thickening possibly due to reflux or esophagitis. Electronically Signed   By: Lucienne Capers M.D.   On: 10/07/2016 06:32    Assessment / Plan:    Assessment: 1. 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 Ardis Hughs  Plan: 1. Continue IV antibiotics and fluid support 2. We will proceed with EUS scheduled tomorrow with Dr. Ardis Hughs. Reviewed risks, benefits, limitations and alternatives and the patient agrees to proceed 3. Repeat PT/INR has been ordered for tomorrow morning 4. Heparin was discontinued for procedure tomorrow 5. Patient was placed on a regular food diet now and nothing by mouth after midnight 6. Encouraged patient to keep her SCDs on  to prevent any blood clots 7. Did see the patient this morning with Dr. Loletha Carrow, if you have any further questions today please feel free to contact me  Thank you for your kind consultation, we will continue to follow.   LOS: 0 days   Levin Erp  10/08/2016, 9:43 AM  Pager # [phone]  I have discussed the case with the PA, and that is the plan I formulated. I personally interviewed and examined the patient.   Dr Ardis Hughs was kind enough to review our consult note in the CT scan. He is planning to proceed with EUS as scheduled tomorrow, as long as the patient does not have evidence of worsening infection or other clinical decompensation before then.  After the EUS with biopsy and staging of this mass, we will then need to address management of this probable pancreatic malignancy. Naturally, she will need oncology evaluation and then consideration of converting her external biliary drain to a metal stent once we are sure that her infection has cleared.   Nelida Meuse III Pager [phone]  Mon-Fri 8a-5p [phone] after 5p, weekends, holidays

## 2016-10-09 NOTE — Progress Notes (Signed)
Spoke with Dr. Ardis Hughs who recommended a consult to IR and oncology as patient has a diagnosis of Pancreatic Adenocarcinoma after her EUS today.  [place] with IR Dr. Jeannine Kitten- he explained that they would take a look to see if patient needed repositioning of biliary stent vs replacement with metal stent.-order placed per his recs  5 Spoke with Dr. Ammie Dalton with medical oncology-he explained that they would get patient an outpatient appt to evaluate further.  Ellouise Newer, PA-C [facility]

## 2016-10-09 NOTE — Interval H&P Note (Signed)
History and Physical Interval Note:  10/09/2016 12:09 PM  Patricia Austin  has presented today for surgery, with the diagnosis of pancreatic mass   The various methods of treatment have been discussed with the patient and family. After consideration of risks, benefits and other options for treatment, the patient has consented to  Procedure(s): UPPER ENDOSCOPIC ULTRASOUND (EUS) LINEAR (N/A) as a surgical intervention .  The patient's history has been reviewed, patient examined, no change in status, stable for surgery.  I have reviewed the patient's chart and labs.  Questions were answered to the patient's satisfaction.     Milus Banister

## 2016-10-09 NOTE — Anesthesia Postprocedure Evaluation (Addendum)
Anesthesia Post Note  Patient: Patricia Austin  Procedure(s) Performed: Procedure(s) (LRB): UPPER ENDOSCOPIC ULTRASOUND (EUS) LINEAR (N/A)  Patient location during evaluation: Endoscopy Anesthesia Type: General Level of consciousness: awake and alert Pain management: pain level controlled Vital Signs Assessment: post-procedure vital signs reviewed and stable Respiratory status: spontaneous breathing, nonlabored ventilation, respiratory function stable and patient connected to nasal cannula oxygen Cardiovascular status: stable and blood pressure returned to baseline Anesthetic complications: no       Last Vitals:  Vitals:   10/09/16 1350 10/09/16 1400  BP: (!) 178/67 (!) 152/73  Pulse: 88 82  Resp: 14 18  Temp:  36.7 C    Last Pain:  Vitals:   10/09/16 1400  TempSrc: Oral  PainSc:                  Earlee Herald,JAMES TERRILL

## 2016-10-09 NOTE — Op Note (Signed)
[facility] Patient Name: Patricia Austin Procedure Date: 10/09/2016 MRN: [id number] Attending MD: Milus Banister , MD Date of Birth: 1946-04-24 CSN: [id number] Age: 71 Admit Type: Inpatient Procedure:                Upper EUS Indications:              Suspected mass in pancreas on CT scan; painless                            jaundice; s/p incomplete ERCP 2 weeks ago Dr.                            Carlean Purl (tortuous, spastic duodenum with                            diverticulum, unable to locate papilla), eventual                            Int/Ext drain placed by IR. Providers:                Milus Banister, MD, Hilma Favors, RN, Tinnie Gens, Technician, Cletis Athens, Technician Referring MD:              Medicines:                Monitored Anesthesia Care Complications:            No immediate complications. Estimated blood loss:                            None. Estimated Blood Loss:     Estimated blood loss: none. Procedure:                Pre-Anesthesia Assessment:                           - Prior to the procedure, a History and Physical                            was performed, and patient medications and                            allergies were reviewed. The patient's tolerance of                            previous anesthesia was also reviewed. The risks                            and benefits of the procedure and the sedation                            options and risks were discussed with the patient.  All questions were answered, and informed consent                            was obtained. Prior Anticoagulants: The patient has                            taken no previous anticoagulant or antiplatelet                            agents. ASA Grade Assessment: III - A patient with                            severe systemic disease. After reviewing the risks                            and benefits, the patient  was deemed in                            satisfactory condition to undergo the procedure.                           After obtaining informed consent, the endoscope was                            passed under direct vision. Throughout the                            procedure, the patient's blood pressure, pulse, and                            oxygen saturations were monitored continuously. The                            [id number] ([id number]) scope was introduced through                            the mouth, and advanced to the duodenal bulb. The                            upper EUS was accomplished without difficulty. The                            patient tolerated the procedure well. Scope In: Scope Out: Findings:      Endoscopic Finding :      Normal esophagus and stomach      The duodenum was very tortuous, spastic begining just distal to the       duodenal bulb. 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 I       could not visualize the internal portion of the recently placed bile       duct drain however could not visualize the papilla either (with radial       echoendoscope or standard ERCP side viewing scope).      Endosonographic Finding :      1. An irregular mass was  identified in the head/uncinate process of the       pancreas. The mass was hypoechoic and heterogenous. The mass measured 38       mm in maximal cross-sectional diameter. The outer margins were       irregular. Fine needle aspiration for cytology was performed. Color       Doppler imaging was utilized prior to needle puncture to confirm a lack       of significant vascular structures within the needle path. Three passes       were made with the 25 gauge needle using a transduodenal approach. A       stylet was used. A cytotechnologist was present to evaluate the adequacy       of the specimen. Final cytology results are pending.      2. There was significant anatomic distortion and air artifact related to        the duodenal diverticulum, tortuous and spastic duodenum that I could       not evaluate for vascular involvement.      3. Gallbladder contains a large shadowing gallstone.      4. Limited views of liver, spleen, portal and splenic vessels were all       normal. Impression:               - 3.8cm solid mass in pancreatic head/uncinate.                            Preliminary cytology review is positive for                            malignancy (adenocarcinoma).                           - There was significant anatomic distortion of the                            duodenum related to diverticulum, spasticity. Air                            artifact also precluded evaluation of vascular                            structures. I could not visualize the distal end of                            the bile duct drain. After reviewing her most                            recent CT I am not sure that the distal end of the                            drain is still within the duodenum and recommend                            evaluation by IR (drain check), can consider  placing a metal stent at the same time. Moderate Sedation:      N/A- Per Anesthesia Care Recommendation:           - Return patient to hospital ward for ongoing care.                           - Advance diet as tolerated.                           - Continue present medications. Procedure Code(s):        --- Professional ---                           [phone], Esophagogastroduodenoscopy, flexible,                            transoral; with transendoscopic ultrasound-guided                            intramural or transmural fine needle                            aspiration/biopsy(s), (includes endoscopic                            ultrasound examination limited to the esophagus,                            stomach or duodenum, and adjacent structures) Diagnosis Code(s):        --- Professional ---                            K31.89, Other diseases of stomach and duodenum                           K86.89, Other specified diseases of pancreas                           R93.3, Abnormal findings on diagnostic imaging of                            other parts of digestive tract CPT copyright 2016 [employer]. All rights reserved. The codes documented in this report are preliminary and upon coder review may  be revised to meet current compliance requirements. Milus Banister, MD 10/09/2016 1:19:03 PM This report has been signed electronically. Number of Addenda: 0

## 2016-10-09 NOTE — Anesthesia Preprocedure Evaluation (Addendum)
Anesthesia Evaluation  Patient identified by MRN, date of birth, ID band Patient awake    Reviewed: Allergy & Precautions, NPO status , Patient's Chart, lab work & pertinent test results  History of Anesthesia Complications (+) DIFFICULT AIRWAY  Airway Mallampati: III  TM Distance: <3 FB Neck ROM: Limited  Mouth opening: Limited Mouth Opening  Dental  (+) Teeth Intact, Dental Advisory Given   Pulmonary shortness of breath,    breath sounds clear to auscultation       Cardiovascular hypertension, + dysrhythmias  Rhythm:Regular Rate:Normal     Neuro/Psych    GI/Hepatic GERD  ,  Endo/Other  diabetes  Renal/GU Renal disease     Musculoskeletal   Abdominal (+) + obese,   Peds  Hematology   Anesthesia Other Findings   Reproductive/Obstetrics                            Anesthesia Physical Anesthesia Plan  ASA: III  Anesthesia Plan: General   Post-op Pain Management:    Induction: Intravenous  Airway Management Planned: Natural Airway and Nasal Cannula  Additional Equipment:   Intra-op Plan:   Post-operative Plan:   Informed Consent: I have reviewed the patients History and Physical, chart, labs and discussed the procedure including the risks, benefits and alternatives for the proposed anesthesia with the patient or authorized representative who has indicated his/her understanding and acceptance.   Dental advisory given  Plan Discussed with: CRNA  Anesthesia Plan Comments:         Anesthesia Quick Evaluation

## 2016-10-09 NOTE — Procedures (Signed)
Interventional Radiology Procedure Note  Procedure:  1.) Cholangiogram 2.) Placement of 10 x 60 mm covered metal WalFlex biliary stent 3.) Exchange for a 12F external biliary drain positioned at the hepatic confluence  Complications: None  Estimated Blood Loss: None  Recommendations:  1. Keep external biliary drain to bag for at least 24 hrs or until Br has significantly improved, then remove the bag and cap the tube.   Tube should be capped prior to DC.  2.) Trend Br daily or every other day   3.) Return to IR in 2-4 weeks for cholangiogram and check with possible biliary tube removal.    Signed,  Criselda Peaches, MD

## 2016-10-09 NOTE — Transfer of Care (Signed)
Immediate Anesthesia Transfer of Care Note  Patient: Patricia Austin  Procedure(s) Performed: Procedure(s): UPPER ENDOSCOPIC ULTRASOUND (EUS) LINEAR (N/A)  Patient Location: PACU and Endoscopy Unit  Anesthesia Type:MAC  Level of Consciousness: sedated  Airway & Oxygen Therapy: Patient Spontanous Breathing and Patient connected to nasal cannula oxygen  Post-op Assessment: Report given to RN and Post -op Vital signs reviewed and stable  Post vital signs: Reviewed and stable  Last Vitals:  Vitals:   10/09/16 0455 10/09/16 1141  BP: (!) 161/79 (!) 185/67  Pulse: 90 88  Resp: 18 16  Temp: 36.8 C 37.1 C    Last Pain:  Vitals:   10/09/16 1141  TempSrc: Oral  PainSc:       Patients Stated Pain Goal: 0 (24/23/53 6144)  Complications: No apparent anesthesia complications

## 2016-10-09 NOTE — Progress Notes (Signed)
Referring Physician(s): Rande Brunt  Supervising Physician: Jacqulynn Cadet  Patient Status:  [facility] - In-pt  Chief Complaint:  Pancreatic cancer, biliary obstruction  Subjective: Patient status post EUS /panc FNA today with prelim path yielding adenoca; she is s/p biliary drain placement on 09/29/16; concern noted that drain tip may not be in duodenum; request now received from GI service for cholangiogram with possible metallic stent placement and/or drain exchange. She is currently stable, afebrile and without significant discomfort, respiratory difficulties, nausea/ vomiting or bleeding. Past Medical History:  Diagnosis Date  . Anxiety    "not all the time"   . Chronic lower back pain   . Complication of anesthesia 2002   difficulty /w intubation, with plastic surgery , because she was feeling so anxious. 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 .   Pt. reports that she  is very sensitive to med. & is hard  to wake up.  . Depression    "not all the time"   . Difficult intubation 2002   with head surgery 2002 has done ok with later intubations  . Dysrhythmia    told that she sometimes has extra beats. Does not see cardiologist, had a stress test many yrs. ago>10 yrs.   . Esophageal dysmotility    "getting worse recenly" (12/15/2013)  . Fibromyalgia   . Hypertension   . Nephritis ~ 1955  . Osteoarthritis    knees   . Pancreatic mass   . Seasonal allergies    "in the spring"  . Shortness of breath    walking long distances   . squamous cell carcinoma scalp    squamous, on scalp, & has had several surgeries on scalp, for plastics repair of scalp   . Type II diabetes mellitus ([place])    Past Surgical History:  Procedure Laterality Date  . ABDOMINAL HYSTERECTOMY     complete  . APPENDECTOMY    . BACK SURGERY  07/2016   L 4 and L 5 fusion done [facility]   . CARPAL TUNNEL RELEASE Right   . CARPAL TUNNEL RELEASE Left   . COLONOSCOPY    . CYST EXCISION  1990's?   scalp    . ESOPHAGOGASTRODUODENOSCOPY N/A 09/26/2016   Procedure: ESOPHAGOGASTRODUODENOSCOPY (EGD);  Surgeon: Gatha Mayer, MD;  Location: [facility] ENDOSCOPY;  Service: Endoscopy;  Laterality: N/A;  . IR INT EXT BILIARY DRAIN WITH CHOLANGIOGRAM  09/29/2016   2 gallstones  . LUMBAR DISC SURGERY  ~ 2012 X 2  . SKIN GRAFT  2002   scalp; "went to [facility] to repair OR earlier in the yearr"  . SQUAMOUS CELL CARCINOMA EXCISION  2002   "off scalp; cancerous area removed and stapled; Dr. Georgia Lopes"  . TONSILLECTOMY  age 57  . TOTAL KNEE ARTHROPLASTY Left 12/14/2013   Procedure: LEFT TOTAL KNEE ARTHROPLASTY;  Surgeon: Ninetta Lights, MD;  Location: [place];  Service: Orthopedics;  Laterality: Left;  . TOTAL KNEE ARTHROPLASTY Left 12/15/2013     Allergies: Patient has no known allergies.  Medications: Prior to Admission medications   Medication Sig Start Date End Date Taking? Authorizing Provider  amLODipine (NORVASC) 5 MG tablet Take 1 tablet (5 mg total) by mouth daily. Patient taking differently: Take 5 mg by mouth at bedtime.  07/18/16  Yes Mary-Margaret Hassell Done, FNP  amoxicillin-clavulanate (AUGMENTIN) 500-125 MG tablet Take 1 tablet (500 mg total) by mouth 2 (two) times daily. 10/02/16  Yes Verlee Monte, MD  benazepril (LOTENSIN) 40 MG tablet Take 1  tablet (40 mg total) by mouth at bedtime. 10/02/16  Yes Verlee Monte, MD  carvedilol (COREG) 25 MG tablet TAKE  (1)  TABLET TWICE A DAY WITH MEALS (BREAKFAST AND SUPPER) Patient taking differently: Take 25 mg by mouth 2 (two) times daily with a meal.  07/18/16  Yes Mary-Margaret Hassell Done, FNP  colestipol (COLESTID) 5 g packet Take 5 g by mouth 2 (two) times daily. 09/26/16  Yes Gatha Mayer, MD  diclofenac (VOLTAREN) 75 MG EC tablet Take 75 mg by mouth 2 (two) times daily as needed for mild pain.   Yes Historical Provider, MD  DULoxetine (CYMBALTA) 60 MG capsule Take 1 capsule (60 mg total) by mouth 2 (two) times daily. Patient taking differently: Take 60 mg by mouth daily.   07/18/16  Yes Mary-Margaret Hassell Done, FNP  furosemide (LASIX) 20 MG tablet Take 1 tablet (20 mg total) by mouth daily. Patient taking differently: Take 20 mg by mouth daily as needed for edema.  07/18/16  Yes Mary-Margaret Hassell Done, FNP  gabapentin (NEURONTIN) 300 MG capsule TAKE (1) CAPSULE TWICE DAILY. Patient taking differently: Take 300 mg by mouth at bedtime.  07/18/16  Yes Mary-Margaret Hassell Done, FNP  glipiZIDE-metformin (METAGLIP) 5-500 MG tablet Take 2 tablets by mouth 2 (two) times daily before a meal. 07/18/16  Yes Mary-Margaret Hassell Done, FNP  HYDROcodone-acetaminophen (NORCO) 7.5-325 MG tablet Take 1 tablet by mouth every 6 (six) hours as needed for moderate pain. 08/21/16  Yes Ashok Pall, MD  Insulin Glargine (TOUJEO SOLOSTAR) 300 UNIT/ML SOPN Inject 60 Units into the skin daily. Patient taking differently: Inject 40 Units into the skin at bedtime.  08/15/16  Yes Mary-Margaret Hassell Done, FNP  pantoprazole (PROTONIX) 40 MG tablet Take 1 tablet (40 mg total) by mouth daily before breakfast. 09/26/16  Yes Gatha Mayer, MD     Vital Signs: BP (!) 152/73   Pulse 82   Temp 98.1 F (36.7 C) (Oral)   Resp 18   Ht 5\' 3"  (1.6 m)   Wt (!) 417 lb 12.4 oz (189.5 kg)   SpO2 97%   BMI 74.00 kg/m   Physical Exam awake, alert. Jaundiced with scleral icterus; Chest with mildly diminished breath sounds bases. Heart with regular rate and rhythm. Abdomen soft, obese, positive bowel sounds, intact midabdominal biliary drain with green bile in bag, insertion site okay, minimal tenderness;   Imaging: Ct Abdomen Pelvis W Contrast  Result Date: 10/07/2016 CLINICAL DATA:  Worsening generalized abdominal pain for 1 week. History of pancreatic mass post cholangiogram with biliary drainage placement. Draining green fluid. White cell count 17.6. Elevated lipase. History of hypertension and diabetes. EXAM: CT ABDOMEN AND PELVIS WITH CONTRAST TECHNIQUE: Multidetector CT imaging of the abdomen and pelvis was performed  using the standard protocol following bolus administration of intravenous contrast. CONTRAST:  80 mL Isovue-300 COMPARISON:  09/20/2016 FINDINGS: Lower chest: Calcified granulomas in the left lung base. Nodule in the right lung base measuring 5 mm diameter, unchanged. Distal esophageal wall is thickened possibly indicating reflux or esophagitis. Hepatobiliary: Scattered calcified granulomas in the liver. Mild intrahepatic bile duct dilatation. Biliary drainage catheter is present, injuring through the left lobe of the liver. Distal catheter is demonstrated in the lower common bile duct. Cholelithiasis with a large stone in the gallbladder. Small amount of gas in the gallbladder is likely related to catheter insertion. Pancreas: There is a mass in the head and uncinate process of the pancreas measuring up to 4 x 4.2 cm diameter. Central necrosis is present. The  mass is causing obstruction of the distal common bile duct. Mass is contiguous with the duodenum and erect duodenal invasion is not excluded. The mass abuts the superior mesenteric artery and vein without circumferential involvement. No pancreatic ductal dilatation. Spleen: Calcified granulomas in the spleen. Adrenals/Urinary Tract: No adrenal gland nodules. Renal nephrograms are homogeneous and symmetrical. Small subcentimeter cysts. No hydronephrosis or hydroureter. Bladder wall is not thickened. Stomach/Bowel: Stomach and small bowel are decompressed. Scattered gas and stool in the colon. No colonic distention or inflammation. Scattered colonic diverticula. Appendix is not identified. Vascular/Lymphatic: Aortic atherosclerosis. No enlarged abdominal or pelvic lymph nodes. Reproductive: Status post hysterectomy. No adnexal masses. Other: No free air or free fluid in the abdomen. Abdominal wall musculature appears intact. Musculoskeletal: Degenerative changes in the lumbar spine. Postoperative changes with laminectomies, posterior rod and screw fixation, and  intervertebral disc prostheses from L3 through L5. IMPRESSION: Mass in the head and uncinate process of the pancreas is again demonstrated, with associated obstruction of the distal common bile duct. Biliary drainage catheter is in place with tip in the distal common bile duct. Mild residual intrahepatic bile duct dilatation. Cholelithiasis. Calcified granulomas. Right lower lung nodules measuring up to 5 mm without change. Esophageal wall thickening possibly due to reflux or esophagitis. Electronically Signed   By: Lucienne Capers M.D.   On: 10/07/2016 06:32    Labs:  CBC:  Recent Labs  10/02/16 0403 10/07/16 0135 10/08/16 0703 10/09/16 0609  WBC 6.8 17.6* 10.2 6.7  HGB 8.9* 12.0 9.7* 9.0*  HCT 26.9* 35.3* 29.2* 26.5*  PLT 212 373 302 265    COAGS:  Recent Labs  09/20/16 1314 09/29/16 1350 10/09/16 0609  INR 0.86 0.90 1.03  APTT 33  --   --     BMP:  Recent Labs  10/02/16 0403 10/03/16 1256 10/07/16 0135 10/08/16 0703  NA 137 138 129* 137  K 4.4 5.1 4.2 3.5  CL 108 101 94* 107  CO2 21* 21 25 21*  GLUCOSE 265* 399* 412* 56*  BUN 31* 21 16 12   CALCIUM 8.5* 9.5 9.7 8.9  CREATININE 1.92* 1.53* 1.17* 1.12*  GFRNONAA 25* 34* 45* 48*  GFRAA 29* 39* 53* 55*    LIVER FUNCTION TESTS:  Recent Labs  10/02/16 0403 10/03/16 1256 10/07/16 0135 10/08/16 0703  BILITOT 2.5* 2.6* 4.3* 4.1*  AST 29 29 43* 39  ALT 25 25 32 28  ALKPHOS 271* 353* 333* 259*  PROT 5.8* 6.4 7.7 6.0*  ALBUMIN 2.2* 3.5 3.1* 2.3*    Assessment and Plan: Patient with pancreatic cancer/biliary obstruction, status post EUS with FNA of pancreas today with preliminary pathology revealing adenocarcinoma; status post internal/external biliary drain placement on 09/29/16; question now  raised whether tip of drain is in duodenum; request received for follow-up cholangiogram with possible metallic biliary stent placement and/or drain exchange; afebrile; WBC normal, hemoglobin 9.0, platelets 265k, PT/INR  normal, creatinine 1.12 and total bilirubin 4.1 yesterday; case has been reviewed by Dr. Laurence Ferrari. Plan at this time is to proceed with the above procedure this afternoon. Details/risks of procedure, including but not limited to, internal bleeding, infection, inability to place stent,  as well as need for prolonged external drainage discussed with patient and husband with their understanding and consent.   Electronically Signed: D. Nash Mantis 10/09/2016, 3:14 PM   I spent a total of 20 minutes at the the patient's bedside AND on the patient's hospital floor or unit, greater than 50% of which was counseling/coordinating care  for biliary drain    Patient ID: Patricia Austin, female   DOB: Sep 15, 1945, 71 y.o.   MRN: [id number]

## 2016-10-10 ENCOUNTER — Encounter (HOSPITAL_COMMUNITY): Payer: Self-pay | Admitting: Gastroenterology

## 2016-10-10 DIAGNOSIS — C250 Malignant neoplasm of head of pancreas: Secondary | ICD-10-CM

## 2016-10-10 LAB — GLUCOSE, CAPILLARY
GLUCOSE-CAPILLARY: 188 mg/dL — AB (ref 65–99)
GLUCOSE-CAPILLARY: 184 mg/dL — AB (ref 65–99)
Glucose-Capillary: 93 mg/dL (ref 65–99)
Glucose-Capillary: 194 mg/dL — ABNORMAL HIGH (ref 65–99)

## 2016-10-10 LAB — COMPREHENSIVE METABOLIC PANEL
ALT: 22 U/L (ref 14–54)
AST: 29 U/L (ref 15–41)
Albumin: 2.1 g/dL — ABNORMAL LOW (ref 3.5–5.0)
Alkaline Phosphatase: 225 U/L — ABNORMAL HIGH (ref 38–126)
Anion gap: 6 (ref 5–15)
BUN: 11 mg/dL (ref 6–20)
CHLORIDE: 111 mmol/L (ref 101–111)
CO2: 22 mmol/L (ref 22–32)
CREATININE: 1.24 mg/dL — AB (ref 0.44–1.00)
Calcium: 8.6 mg/dL — ABNORMAL LOW (ref 8.9–10.3)
GFR calc Af Amer: 49 mL/min — ABNORMAL LOW (ref >60)
GFR calc non Af Amer: 42 mL/min — ABNORMAL LOW (ref >60)
Glucose, Bld: 98 mg/dL (ref 65–99)
POTASSIUM: 3.7 mmol/L (ref 3.5–5.1)
SODIUM: 139 mmol/L (ref 135–145)
Total Bilirubin: 3.5 mg/dL — ABNORMAL HIGH (ref 0.3–1.2)
Total Protein: 5.5 g/dL — ABNORMAL LOW (ref 6.5–8.1)

## 2016-10-10 LAB — CBC
HCT: 26.7 % — ABNORMAL LOW (ref 36.0–46.0)
Hemoglobin: 8.8 g/dL — ABNORMAL LOW (ref 12.0–15.0)
MCH: 31 pg (ref 26.0–34.0)
MCHC: 33 g/dL (ref 30.0–36.0)
MCV: 94 fL (ref 78.0–100.0)
PLATELETS: 284 10*3/uL (ref 150–400)
RBC: 2.84 MIL/uL — AB (ref 3.87–5.11)
RDW: 13.8 % (ref 11.5–15.5)
WBC: 8.2 10*3/uL (ref 4.0–10.5)

## 2016-10-10 MED ORDER — BENAZEPRIL HCL 20 MG PO TABS
40.0000 mg | ORAL_TABLET | Freq: Every day | ORAL | Status: DC
  Administered 2016-10-10: 40 mg via ORAL
  Filled 2016-10-10: qty 2

## 2016-10-10 NOTE — Progress Notes (Signed)
PROGRESS NOTE    Patricia Austin  RKY:[id number] DOB: [birth year] DOA: 10/07/2016 PCP: Chevis Pretty, FNP   Brief Narrative: Patricia Austin is a 71 y.o. female with a history of diabetes mellitus, obesity, CKD stage III, pancreatic mass. She presented with abdominal pain. Gastroenterology to perform EUS on 4/19. Zosyn for concern of cholangitis.   Assessment & Plan:   Active Problems:   Diabetes ([place])   Morbid obesity ([facility])   Hypertension   GERD (gastroesophageal reflux disease)   CKD (chronic kidney disease) stage 3, GFR 30-59 ml/min   Pancreatic mass   Biliary obstruction   Hyponatremia   Abdominal pain   Pancreatitic mass Biliary obstruction Cholangitis Afebrile. EUS performed and highly suspicious for cancer. Pathology sent. Cytology pending. Stent replaced by IR -continue IV Zosyn -trend bilirubin per IR recommendations before removing and capping drain  Dehydration Hyponatremia Resolved with IV fluids  Essential hypertension -continue benazepril, Norvasc and Coreg  Diabetes mellitus, type 2 -continue SSI  GERD -continue Protonix  Peripheral neuropathy -continue gabapentin   DVT prophylaxis: SCDs Code Status: Full code Family Communication: Husband at baseline Disposition Plan: Discharge home in tomorrow   Consultants:   Gastroenterology  Interventional radiology  Procedures:   None  Antimicrobials:  Zosyn   Subjective: No nausea, vomiting or abdominal pain.  Objective: Vitals:   10/09/16 2040 10/10/16 0448 10/10/16 0452 10/10/16 0824  BP: (!) 184/77 (!) 148/69  (!) 138/57  Pulse: 89 74  80  Resp: 16 16  17   Temp: 98.3 F (36.8 C) 98.9 F (37.2 C)  98.1 F (36.7 C)  TempSrc: Oral Oral  Oral  SpO2: 97% 94%  94%  Weight:   89.1 kg (196 lb 6.9 oz)   Height:        Intake/Output Summary (Last 24 hours) at 10/10/16 1352 Last data filed at 10/10/16 0935  Gross per 24 hour  Intake              845 ml  Output              125 ml   Net              720 ml   Filed Weights   10/07/16 0005 10/08/16 0500 10/10/16 0452  Weight: 82.1 kg (181 lb) 86 kg (189 lb 8 oz) 89.1 kg (196 lb 6.9 oz)    Examination:  General exam: Appears calm and comfortable Respiratory system: Clear to auscultation. Respiratory effort normal. Cardiovascular system: S1 & S2 heard, RRR. No murmurs. Gastrointestinal system: Abdomen is nondistended, soft and nontender. Drain in place with green fluid in bag. Normal bowel sounds heard. Central nervous system: Alert and oriented. No focal neurological deficits. Extremities: No edema. No calf tenderness Skin: No cyanosis. No rashes Psychiatry: Judgement and insight appear normal. Mood & affect appropriate.     Data Reviewed: I have personally reviewed following labs and imaging studies  CBC:  Recent Labs Lab 10/07/16 0135 10/08/16 0703 10/09/16 0609 10/10/16 0642  WBC 17.6* 10.2 6.7 8.2  HGB 12.0 9.7* 9.0* 8.8*  HCT 35.3* 29.2* 26.5* 26.7*  MCV 93.4 94.2 95.3 94.0  PLT 373 302 265 315   Basic Metabolic Panel:  Recent Labs Lab 10/07/16 0135 10/08/16 0703 10/10/16 0642  NA 129* 137 139  K 4.2 3.5 3.7  CL 94* 107 111  CO2 25 21* 22  GLUCOSE 412* 56* 98  BUN 16 12 11   CREATININE 1.17* 1.12* 1.24*  CALCIUM 9.7 8.9 8.6*  GFR: Estimated Creatinine Clearance: 43.4 mL/min (A) (by C-G formula based on SCr of 1.24 mg/dL (H)).   Liver Function Tests:  Recent Labs Lab 10/07/16 0135 10/08/16 0703 10/10/16 0642  AST 43* 39 29  ALT 32 28 22  ALKPHOS 333* 259* 225*  BILITOT 4.3* 4.1* 3.5*  PROT 7.7 6.0* 5.5*  ALBUMIN 3.1* 2.3* 2.1*    Recent Labs Lab 10/07/16 0135  LIPASE 78*   No results for input(s): AMMONIA in the last 168 hours. Coagulation Profile:  Recent Labs Lab 10/09/16 0609  INR 1.03   Cardiac Enzymes: No results for input(s): CKTOTAL, CKMB, CKMBINDEX, TROPONINI in the last 168 hours. BNP (last 3 results) No results for input(s): PROBNP in the last  8760 hours. HbA1C: No results for input(s): HGBA1C in the last 72 hours. CBG:  Recent Labs Lab 10/09/16 0737 10/09/16 1736 10/09/16 2041 10/10/16 0733 10/10/16 1141  GLUCAP 104* 77 128* 93 188*   Lipid Profile: No results for input(s): CHOL, HDL, LDLCALC, TRIG, CHOLHDL, LDLDIRECT in the last 72 hours. Thyroid Function Tests: No results for input(s): TSH, T4TOTAL, FREET4, T3FREE, THYROIDAB in the last 72 hours. Anemia Panel: No results for input(s): VITAMINB12, FOLATE, FERRITIN, TIBC, IRON, RETICCTPCT in the last 72 hours. Sepsis Labs: No results for input(s): PROCALCITON, LATICACIDVEN in the last 168 hours.  Recent Results (from the past 240 hour(s))  Culture, Urine     Status: None   Collection Time: 09/30/16 10:19 PM  Result Value Ref Range Status   Specimen Description URINE, RANDOM  Final   Special Requests NONE  Final   Culture   Final    NO GROWTH Performed at [facility], [address], [place], [place] [address]    Report Status 10/02/2016 FINAL  Final  Blood culture (routine x 2)     Status: None (Preliminary result)   Collection Time: 10/07/16  9:30 AM  Result Value Ref Range Status   Specimen Description BLOOD RIGHT ARM  Final   Special Requests   Final    BOTTLES DRAWN AEROBIC AND ANAEROBIC Blood Culture adequate volume   Culture   Final    NO GROWTH 2 DAYS Performed at [facility], [place] [address], [place], [place] [address]    Report Status PENDING  Incomplete  Blood culture (routine x 2)     Status: None (Preliminary result)   Collection Time: 10/07/16  9:31 AM  Result Value Ref Range Status   Specimen Description BLOOD LEFT HAND  Final   Special Requests IN PEDIATRIC BOTTLE Blood Culture adequate volume  Final   Culture   Final    NO GROWTH 2 DAYS Performed at [facility], [place] [address] [place], [place] [address]    Report Status PENDING  Incomplete         Radiology Studies: Ir Biliary Stent(s) Existing Access [employer]  Dilation Cath Exchange  Result Date: 10/09/2016 INDICATION: 71 year old female with 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 She currently has a left-sided internal/external biliary drainage catheter which has pulled back and may no longer be across the biliary occlusion. She presents for cholangiogram with possible biliary stent placement and possible biliary tube exchange. EXAM: 1. Cholangiogram through existing access 2. Placement of a metallic self expanding covered biliary stent 3. Exchange for a new 10 [place] external biliary drainage catheter MEDICATIONS: Patient is an inpatient already receiving intravenous antibiotics with 3.375 g Zosyn every 8 hours. No additional antibiotic prophylaxis was administered. ANESTHESIA/SEDATION: Due  to a previous paradoxical reaction to Versed, 100 mcg fentanyl was administered intravenously for pain control during the procedure. Conscious sedation was not provided. FLUOROSCOPY TIME:  Fluoroscopy Time: 7 minutes 18 seconds (404.8 mGy). COMPLICATIONS: None immediate. PROCEDURE: Informed written consent was obtained from the patient after a thorough discussion of the procedural risks, benefits and alternatives. All questions were addressed. Maximal Sterile Barrier Technique was utilized including caps, mask, sterile gowns, sterile gloves, sterile drape, hand hygiene and skin antiseptic. A timeout was performed prior to the initiation of the procedure. A hand injection of contrast material was performed through the existing biliary drainage catheter. The drainage catheter has pulled back and the tip remains within the occluded segments of the common bile duct. The tube was cut and removed over a Bentson wire. A 8 5 French angled catheter was advanced over the wire and positioned at the common bile duct occlusion. A hydrophilic roadrunner wire was advanced across the occlusion on the 5 French catheter advanced across the occlusion into the duodenum. A  gentle hand injection of contrast material confirmed the presence of the catheter within the duodenum. The hydrophilic wire was exchanged for a superstiff Amplatz wire. The 5 French catheter was removed. A 9 French sheath was advanced over the wire and positioned in the common bile duct. A WallFlex 10 x 60 covered self expanding metal stent was then advanced across the lesion and deployed from the duodenum into the common bile duct. The stent was then post dilated using a 6 x 40 Mustang balloon. A gentle hand injection of contrast material through the sheath confirms antegrade flow throughout the stent and into the duodenum. There is rapid passage of contrast through the stent. The wire was brought back into the intrahepatic bile ducts and coiled at the intrahepatic biliary confluence. A new 10.2 all-purpose drainage catheter was then advanced over the wire and formed within the common bile duct. A final hand injection of contrast material and fluoroscopic saved image confirms the position of the stent and the external biliary drain. The drain was secured with an 0 Prolene suture and connected to bag drainage. IMPRESSION: 1. Successful placement of a 10 x 60 covered metallic self expanding WallFlex stent across the common bile duct occlusion. 2. The internal/external biliary drainage catheter was removed and replaced with an external biliary drainage catheter positioned at the biliary confluence above the stent. PLAN: 1. Maintain biliary drain to gravity bag drainage until bilirubin has normalized. 2. Once bilirubin has trended downward significantly, removed the bag and cap the tube. The tube should be capped prior to discharge. 3. Return Interventional Radiology in 2-4 weeks for clinical evaluation and cholangiogram prior to removal of the biliary drain. Signed, Criselda Peaches, MD [facility] Electronically Signed   By: Jacqulynn Cadet M.D.   On:  10/09/2016 17:40        Scheduled Meds: . amLODipine  5 mg Oral QHS  . carvedilol  25 mg Oral BID WC  . colestipol  5 g Oral 2 times per day  . DULoxetine  60 mg Oral Daily  . gabapentin  300 mg Oral QHS  . insulin aspart  0-15 Units Subcutaneous TID WC  . insulin glargine  40 Units Subcutaneous QHS  . mouth rinse  15 mL Mouth Rinse BID  . pantoprazole (PROTONIX) IV  40 mg Intravenous Q12H   Continuous Infusions: . sodium chloride 100 mL/hr at 10/09/16 1844  . piperacillin-tazobactam (ZOSYN)  IV  3.375 g (10/10/16 1107)     LOS: 2 days     Cordelia Poche, MD [facility] 10/10/2016, 1:52 PM Pager: [phone]  If 7PM-7AM, please contact night-coverage [email or web address] Password [email or web address] 10/10/2016, 1:52 PM 1

## 2016-10-10 NOTE — Consult Note (Signed)
New Hematology/Oncology Consult   Referral MD:Dr. Lonny Prude     Reason for Referral: Pancreas Cancer   HPI: She reports upper abdominal pain and pruritus for several months. She had a transient episode of facial and left arm numbness on 09/20/2016 and was seen in the emergency room. A brain CT was negative for an acute abnormality. She was noted to be jaundiced. A CT of the abdomen and pelvis on 09/20/2016 revealed small lower lung nodules, mild intrahepatic biliary dilatation and dilatation of the common bile duct. A mass was noted in the head of the pancreas with possible contact with the superior mesenteric artery. Small portacaval nodes measured up to 7 mm.  Gastroenterology was consulted and she was taken to an ERCP procedure by Dr. Carlean Purl on 09/26/2016. The ampulla could not be located. Interventional radiology placed an internal/external biliary drain on 09/29/2016.  She presented to the emergency room 10/07/2016 with abdominal pain. She was admitted for further evaluation. Gastroenterology was consulted. She was felt to have a biliary infection and was placed on antibiotics. She was taken to a EUS procedure by Dr. Ardis Hughs on 10/09/2016. A 3.8 cm solid mass was noted in the pancreas head/uncinate. the vasculature could not be visualized secondary to spasticity and artifact. 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   Interventional radiology placed a metal wall stent at the common bile duct on 10/09/2016 and an externally biliary drain was placed at the biliary confluence above the stent.       Past Medical History:  Diagnosis Date  . Anxiety    "not all the time"   . Chronic lower back pain   . CKD (chronic kidney disease) stage 3, GFR 30-59 ml/min   . Complication of anesthesia 2002   difficulty /w intubation, with plastic surgery , because she was feeling so anxious. 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 .   Pt. reports that she  is very sensitive to  med. & is hard  to wake up.  . Depression    "not all the time"   . Difficult intubation 2002   with head surgery 2002 has done ok with later intubations  . Dysrhythmia    told that she sometimes has extra beats. Does not see cardiologist, had a stress test many yrs. ago>10 yrs.   . Esophageal dysmotility    "getting worse recenly" (12/15/2013)  . Fibromyalgia   . Hypertension   . Nephritis ~ 1955  . Osteoarthritis    knees   . Pancreatic Cancer April 2018  . Seasonal allergies    "in the spring"  . Shortness of breath    walking long distances   . squamous cell carcinoma scalp    squamous, on scalp, & has had several surgeries on scalp, for plastics repair of scalp   . Type II diabetes mellitus (HCC)   :  G3P2- miscarriage  Past Surgical History:  Procedure Laterality Date  . ABDOMINAL HYSTERECTOMY     complete  . APPENDECTOMY    . BACK SURGERY  07/2016   L 4 and L 5 fusion done [facility]   . CARPAL TUNNEL RELEASE Right   . CARPAL TUNNEL RELEASE Left   . COLONOSCOPY    . CYST EXCISION  1990's?   scalp  . ESOPHAGOGASTRODUODENOSCOPY N/A 09/26/2016   Procedure: ESOPHAGOGASTRODUODENOSCOPY (EGD);  Surgeon: Gatha Mayer, MD;  Location: [facility] ENDOSCOPY;  Service: Endoscopy;  Laterality: N/A;  . EUS N/A 10/09/2016   Procedure: UPPER  ENDOSCOPIC ULTRASOUND (EUS) LINEAR;  Surgeon: Milus Banister, MD;  Location: [facility] ENDOSCOPY;  Service: Endoscopy;  Laterality: N/A;  . IR BILIARY STENT(S) EXISTING ACCESS INC DILATION CATH EXCHANGE  10/09/2016  . IR INT EXT BILIARY DRAIN WITH CHOLANGIOGRAM  09/29/2016   2 gallstones  . LUMBAR DISC SURGERY  ~ 2012 X 2  . SKIN GRAFT  2002   scalp; "went to [facility] to repair OR earlier in the yearr"  . SQUAMOUS CELL CARCINOMA EXCISION  2002   "off scalp; cancerous area removed and stapled; Dr. Georgia Lopes"  . TONSILLECTOMY  age 24  . TOTAL KNEE ARTHROPLASTY Left 12/14/2013   Procedure: LEFT TOTAL KNEE ARTHROPLASTY;  Surgeon: Ninetta Lights, MD;  Location: [facility];   Service: Orthopedics;  Laterality: Left;  . TOTAL KNEE ARTHROPLASTY Left 12/15/2013  :   Current Facility-Administered Medications:  .  0.9 %  sodium chloride infusion, , Intravenous, Continuous, Belkys A Regalado, MD, Last Rate: 100 mL/hr at 10/10/16 1447 .  acetaminophen (TYLENOL) tablet 650 mg, 650 mg, Oral, Q6H PRN, 650 mg at 10/09/16 2301 **OR** acetaminophen (TYLENOL) suppository 650 mg, 650 mg, Rectal, Q6H PRN, Belkys A Regalado, MD .  amLODipine (NORVASC) tablet 5 mg, 5 mg, Oral, QHS, Belkys A Regalado, MD, 5 mg at 10/09/16 2100 .  benazepril (LOTENSIN) tablet 40 mg, 40 mg, Oral, QHS, Mariel Aloe, MD .  carvedilol (COREG) tablet 25 mg, 25 mg, Oral, BID WC, Belkys A Regalado, MD, 25 mg at 10/10/16 0908 .  colestipol (COLESTID) packet 5 g, 5 g, Oral, 2 times per day, Belkys A Regalado, MD, 5 g at 10/08/16 2300 .  diphenhydrAMINE (BENADRYL) capsule 25 mg, 25 mg, Oral, Q6H PRN, Gardiner Barefoot, NP, 25 mg at 10/09/16 2334 .  DULoxetine (CYMBALTA) DR capsule 60 mg, 60 mg, Oral, Daily, Belkys A Regalado, MD, 60 mg at 10/10/16 1107 .  gabapentin (NEURONTIN) capsule 300 mg, 300 mg, Oral, QHS, Belkys A Regalado, MD, 300 mg at 10/09/16 2100 .  hydrALAZINE (APRESOLINE) injection 10 mg, 10 mg, Intravenous, Q6H PRN, Belkys A Regalado, MD .  HYDROcodone-acetaminophen (NORCO) 7.5-325 MG per tablet 1 tablet, 1 tablet, Oral, Q6H PRN, Elmarie Shiley, MD, 1 tablet at 10/10/16 1121 .  insulin aspart (novoLOG) injection 0-15 Units, 0-15 Units, Subcutaneous, TID WC, Belkys A Regalado, MD, 3 Units at 10/08/16 1728 .  insulin glargine (LANTUS) injection 40 Units, 40 Units, Subcutaneous, QHS, Belkys A Regalado, MD, 40 Units at 10/09/16 2100 .  iopamidol (ISOVUE-300) 61 % injection 50 mL, 50 mL, Intravenous, Once PRN, Jacqulynn Cadet, MD .  lidocaine (XYLOCAINE) 1 % (with pres) injection, , , PRN, Jacqulynn Cadet, MD, 10 mL at 10/09/16 1650 .  lip balm (CARMEX) ointment, , Topical, PRN, Mariel Aloe,  MD .  MEDLINE mouth rinse, 15 mL, Mouth Rinse, BID, Belkys A Regalado, MD, 15 mL at 10/10/16 1108 .  morphine 4 MG/ML injection 2 mg, 2 mg, Intravenous, Q3H PRN, Gardiner Barefoot, NP, 2 mg at 10/10/16 1446 .  ondansetron (ZOFRAN) tablet 4 mg, 4 mg, Oral, Q6H PRN **OR** ondansetron (ZOFRAN) injection 4 mg, 4 mg, Intravenous, Q6H PRN, Belkys A Regalado, MD, 4 mg at 10/07/16 2026 .  pantoprazole (PROTONIX) injection 40 mg, 40 mg, Intravenous, Q12H, Belkys A Regalado, MD, 40 mg at 10/10/16 1108 .  piperacillin-tazobactam (ZOSYN) IVPB 3.375 g, 3.375 g, Intravenous, Q8H, Terri L Green, RPH, Stopped at 10/10/16 1507:  . amLODipine  5 mg Oral QHS  . benazepril  40 mg Oral QHS  . carvedilol  25 mg Oral BID WC  . colestipol  5 g Oral 2 times per day  . DULoxetine  60 mg Oral Daily  . gabapentin  300 mg Oral QHS  . insulin aspart  0-15 Units Subcutaneous TID WC  . insulin glargine  40 Units Subcutaneous QHS  . mouth rinse  15 mL Mouth Rinse BID  . pantoprazole (PROTONIX) IV  40 mg Intravenous Q12H  :  No Known Allergies:  Family History  Problem Relation Age of Onset  . Early death Mother   . Thrombosis Father   No Family History of Cancer  Social History: Lives with husband in [place].  Worked in a bank. No tobacco use. Rare alcohol use, no transfusion history, no risk factor for hepatitis/hiv  Review of Systems:  Positives include:10-15 pound wt. Loss, pruritis, abdomen/back pain, solid dysphagia-chronic, dark urine, jaundice  A complete ROS was otherwise negative.   Physical Exam:  Blood pressure (!) 169/63, pulse 78, temperature 98 F (36.7 C), temperature source Oral, resp. rate 17, height '5\' 3"'$  (1.6 m), weight 196 lb 6.9 oz (89.1 kg), SpO2 95 %.  HEENT: oral cavity without visible mass, neck without mass Lungs:  Rales at the posterior bases, no distress Cardiac: rrr Abdomen: biliary drain in mid upper abdomen with a gauze dressing, no HSM, nontender, no apparent ascites   Vascular: no leg edema Lymph nodes: no cervical, supraclavicular, axillary, or inguinal nodes Neurologic:  Alert and oriented, the motor exam appears intact in the upper and lower extremities Skin: no rask Musculoskeletal: no spine tenderness  LABS:   Recent Labs  10/09/16 0609 10/10/16 0642  WBC 6.7 8.2  HGB 9.0* 8.8*  HCT 26.5* 26.7*  PLT 265 284   AP-225, bili-3.5  Recent Labs  10/08/16 0703 10/10/16 0642  NA 137 139  K 3.5 3.7  CL 107 111  CO2 21* 22  GLUCOSE 56* 98  BUN 12 11  CREATININE 1.12* 1.24*  CALCIUM 8.9 8.6*      RADIOLOGY:  Ct Head Wo Contrast  Result Date: 09/29/2016 CLINICAL DATA:  Altered mental status EXAM: CT HEAD WITHOUT CONTRAST TECHNIQUE: Contiguous axial images were obtained from the base of the skull through the vertex without intravenous contrast. COMPARISON:  Head CT 09/20/2016 FINDINGS: Despite efforts by the technologist and patient, motion artifact is present on today's examination and could not be eliminated. The findings of this study are interpreted in the context of that limitation. Brain: Within the above limitation, there is no intracranial hemorrhage or mass effect identified. Skull: Normal visualized skull base, calvarium and extracranial soft tissues. Sinuses/Orbits: No sinus fluid levels or advanced mucosal thickening. No mastoid effusion. Normal orbits. IMPRESSION: 1. Severely motion degraded examination. 2. Within that limitation, no acute intracranial abnormality is visualized. Electronically Signed   By: Ulyses Jarred M.D.   On: 09/29/2016 23:54   Ct Head Wo Contrast  Result Date: 09/20/2016 CLINICAL DATA:  Weakness. Left-sided numbness and tingling for 3 days. History of diabetes and hypertension. EXAM: CT HEAD WITHOUT CONTRAST TECHNIQUE: Contiguous axial images were obtained from the base of the skull through the vertex without intravenous contrast. COMPARISON:  06/02/2013 FINDINGS: Brain: There is no evidence of acute  cortical infarct, intracranial hemorrhage, mass, midline shift, or extra-axial fluid collection. Mild cerebral atrophy may have minimally progressed. Periventricular white matter hypodensities also appear slightly increased and are nonspecific but compatible with mild chronic small vessel ischemic disease. Vascular: Mild calcified atherosclerosis  at the skullbase. No hyperdense vessel. Skull: No fracture or focal osseous lesion. Sinuses/Orbits: Visualized paranasal sinuses are clear. Minimal chronic air cell opacification at the mastoid tips. Unremarkable orbits. Other: None. IMPRESSION: 1. No evidence of acute intracranial abnormality. 2. Mild chronic small vessel ischemic disease. Electronically Signed   By: Logan Bores M.D.   On: 09/20/2016 13:48   Ct Abdomen Pelvis W Contrast  Result Date: 10/07/2016 CLINICAL DATA:  Worsening generalized abdominal pain for 1 week. History of pancreatic mass post cholangiogram with biliary drainage placement. Draining green fluid. White cell count 17.6. Elevated lipase. History of hypertension and diabetes. EXAM: CT ABDOMEN AND PELVIS WITH CONTRAST TECHNIQUE: Multidetector CT imaging of the abdomen and pelvis was performed using the standard protocol following bolus administration of intravenous contrast. CONTRAST:  80 mL Isovue-300 COMPARISON:  09/20/2016 FINDINGS: Lower chest: Calcified granulomas in the left lung base. Nodule in the right lung base measuring 5 mm diameter, unchanged. Distal esophageal wall is thickened possibly indicating reflux or esophagitis. Hepatobiliary: Scattered calcified granulomas in the liver. Mild intrahepatic bile duct dilatation. Biliary drainage catheter is present, injuring through the left lobe of the liver. Distal catheter is demonstrated in the lower common bile duct. Cholelithiasis with a large stone in the gallbladder. Small amount of gas in the gallbladder is likely related to catheter insertion. Pancreas: There is a mass in the head  and uncinate process of the pancreas measuring up to 4 x 4.2 cm diameter. Central necrosis is present. The mass is causing obstruction of the distal common bile duct. Mass is contiguous with the duodenum and erect duodenal invasion is not excluded. The mass abuts the superior mesenteric artery and vein without circumferential involvement. No pancreatic ductal dilatation. Spleen: Calcified granulomas in the spleen. Adrenals/Urinary Tract: No adrenal gland nodules. Renal nephrograms are homogeneous and symmetrical. Small subcentimeter cysts. No hydronephrosis or hydroureter. Bladder wall is not thickened. Stomach/Bowel: Stomach and small bowel are decompressed. Scattered gas and stool in the colon. No colonic distention or inflammation. Scattered colonic diverticula. Appendix is not identified. Vascular/Lymphatic: Aortic atherosclerosis. No enlarged abdominal or pelvic lymph nodes. Reproductive: Status post hysterectomy. No adnexal masses. Other: No free air or free fluid in the abdomen. Abdominal wall musculature appears intact. Musculoskeletal: Degenerative changes in the lumbar spine. Postoperative changes with laminectomies, posterior rod and screw fixation, and intervertebral disc prostheses from L3 through L5. IMPRESSION: Mass in the head and uncinate process of the pancreas is again demonstrated, with associated obstruction of the distal common bile duct. Biliary drainage catheter is in place with tip in the distal common bile duct. Mild residual intrahepatic bile duct dilatation. Cholelithiasis. Calcified granulomas. Right lower lung nodules measuring up to 5 mm without change. Esophageal wall thickening possibly due to reflux or esophagitis. Electronically Signed   By: Lucienne Capers M.D.   On: 10/07/2016 06:32   Ct Abdomen Pelvis W Contrast  Result Date: 09/20/2016 CLINICAL DATA:  Transaminitis. Dysuria and lower abdominal pain. Lumbar spine surgery 1 month ago. EXAM: CT ABDOMEN AND PELVIS WITH  CONTRAST TECHNIQUE: Multidetector CT imaging of the abdomen and pelvis was performed using the standard protocol following bolus administration of intravenous contrast. CONTRAST:  80 mL Isovue 300 COMPARISON:  None. FINDINGS: Lower chest: 5 mm right lower lobe lung nodule (series 4, image 12). 3 mm right lower lobe nodule (series 4, image 14). 5 mm calcified nodule in the left lower lobe. No lung consolidation or pleural effusion. Hepatobiliary: Diffusely decreased attenuation of the liver suggesting steatosis.  Scattered calcified granulomas in the liver. Mild intrahepatic biliary dilatation. Common bile duct dilatation measuring 12 mm. 3.4 cm stone in the gallbladder without evidence of acute cholecystitis. Pancreas: There is an approximately 4.0 x 3.1 x 3.9 cm heterogeneously hypoenhancing mass at the level of the uncinate process of the pancreas. This may contact the posterior margin of the adjacent superior mesenteric artery and partially encases some small adjacent SMA branch vessels. There is not a substantial enough amount of surrounding stranding to suggest that this represents pancreatitis rather than neoplasm. There is pancreatic atrophy without ductal dilatation. Spleen: Calcified granulomas in the spleen. Adrenals/Urinary Tract: Unremarkable adrenal glands. No evidence of renal calculi or hydronephrosis. Subcentimeter low-density renal lesions bilaterally are too small to fully characterize but most likely represent cysts. Unremarkable bladder. Stomach/Bowel: Apparent mild diffuse wall thickening of the visualized distal esophagus. The stomach is within normal limits. Duodenal diverticular noted. No evidence of bowel obstruction. Mild colonic diverticulosis without evidence of diverticulitis. Prior appendectomy. Vascular/Lymphatic: Minimal atherosclerotic plaque in the abdominal aorta without aneurysm. Small portacaval lymph nodes measure up to 7 mm in short axis. Reproductive: Prior hysterectomy.  No  adnexal mass. Other: No intraperitoneal free fluid. Musculoskeletal: Postoperative changes from recent L3-L5 PLIF. IMPRESSION: 1. 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 Abdominal MRI is recommended for further evaluation. 2. Associated mild intrahepatic and extrahepatic biliary dilatation. 3. Small right lower lobe lung nodules measuring up to 5 mm, indeterminate. Metastatic disease is not excluded. 4. Cholelithiasis. 5. Hepatic steatosis. 6. Distal esophageal wall thickening, query esophagitis. Electronically Signed   By: Logan Bores M.D.   On: 09/20/2016 21:13   Dg Chest Port 1 View  Result Date: 09/30/2016 CLINICAL DATA:  Fever EXAM: PORTABLE CHEST 1 VIEW COMPARISON:  Chest radiograph 08/20/2016 FINDINGS: Cardiomediastinal silhouette remains mildly enlarged. There are diffuse peribronchial opacities, likely indicating pulmonary edema. Unchanged left basilar calcified granuloma. No pneumothorax or sizable pleural effusion. IMPRESSION: Shallow lung inflation with mild pulmonary edema. Electronically Signed   By: Ulyses Jarred M.D.   On: 09/30/2016 04:49   Dg C-arm 1-60 Min-no Report  Result Date: 09/26/2016 Fluoroscopy was utilized by the requesting physician.  No radiographic interpretation.   Ir Int Lianne Cure Biliary Drain With Cholangiogram  Result Date: 09/29/2016 INDICATION: 71 year old with pancreatic mass and biliary obstruction. Patient needs a tissue diagnosis and biliary decompression. Scheduled for PTC with biliary drain placement. EXAM: PLACEMENT OF AN INTERNAL/EXTERNAL BILIARY DRAIN WITH ULTRASOUND AND FLUOROSCOPIC GUIDANCE MEDICATIONS: Zosyn 3.375 g; The antibiotic was administered within an appropriate time frame prior to the initiation of the procedure. Hydralazine 10 mg ANESTHESIA/SEDATION: Moderate (conscious) sedation was employed during this procedure. A total of Versed 3.0 mg and Fentanyl 100 mcg was administered  intravenously. Moderate Sedation Time: 27 minutes. The patient's level of consciousness and vital signs were monitored continuously by radiology nursing throughout the procedure under my direct supervision. FLUOROSCOPY TIME:  Fluoroscopy Time: 1 minutes and 24 seconds, 59 mGy CONTRAST:  20 mL [id number] COMPLICATIONS: None immediate. PROCEDURE: Informed written consent was obtained from the patient after a thorough discussion of the procedural risks, benefits and alternatives. All questions were addressed. Maximal Sterile Barrier Technique was utilized including caps, mask, sterile gowns, sterile gloves, sterile drape, hand hygiene and skin antiseptic. A timeout was performed prior to the initiation of the procedure. Patient was placed supine. The anterior and right side of the abdomen were prepped and draped in a sterile fashion. The liver  was evaluated with ultrasound. Mildly dilated left hepatic duct was targeted with ultrasound. The anterior abdomen was anesthetized with 1% lidocaine. [place] needle was directed into this mildly dilated intrahepatic bile duct with ultrasound guidance. A wire advanced easily into the biliary system under fluoroscopic guidance. A micropuncture dilator set was placed and contrast injection confirmed placement in the biliary system. At this point, the patient was very combative and required restraining in order to prevent the patient from coming off the table. The combativeness appear to be secondary to the sedation medicines rather than pain. 5 [place] Kumpe catheter and Glidewire were advanced across the distal biliary obstruction with minimal difficulty. Due to patient's combativeness and confusion, we had no opportunity to perform a brush biopsy. 10 [place] biliary drain was successfully placed over a stiff Amplatz wire. Tip was placed in the duodenum. Small amount of bile was aspirated for culture. Drain was sutured to skin and secured with a Stat Lock. FINDINGS: Mild  dilatation of the intrahepatic ducts. Common bile duct is moderately distended. There is an obstruction at the distal common bile duct. Catheter and wire were able to traverse the distal biliary obstruction. 10 French drain was successfully advanced into the duodenum. Unable to perform a brush biopsy due to patient's combative state during the procedure. IMPRESSION: Successful placement of an internal/external biliary drain. Patient will need to be scheduled at a later time for brush biopsy and potential internalization. Alternately, patient may need an EUS for tissue diagnosis. Electronically Signed   By: Markus Daft M.D.   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 She currently has a left-sided internal/external biliary drainage catheter which has pulled back and may no longer be across the biliary occlusion. She presents for cholangiogram with possible biliary stent placement and possible biliary tube exchange. EXAM: 1. Cholangiogram through existing access 2. Placement of a metallic self expanding covered biliary stent 3. Exchange for a new 10 [place] external biliary drainage catheter MEDICATIONS: Patient is an inpatient already receiving intravenous antibiotics with 3.375 g Zosyn every 8 hours. No additional antibiotic prophylaxis was administered. ANESTHESIA/SEDATION: Due to a previous paradoxical reaction to Versed, 100 mcg fentanyl was administered intravenously for pain control during the procedure. Conscious sedation was not provided. FLUOROSCOPY TIME:  Fluoroscopy Time: 7 minutes 18 seconds (404.8 mGy). COMPLICATIONS: None immediate. PROCEDURE: Informed written consent was obtained from the patient after a thorough discussion of the procedural risks, benefits and alternatives. All questions were addressed. Maximal Sterile Barrier  Technique was utilized including caps, mask, sterile gowns, sterile gloves, sterile drape, hand hygiene and skin antiseptic. A timeout was performed prior to the initiation of the procedure. A hand injection of contrast material was performed through the existing biliary drainage catheter. The drainage catheter has pulled back and the tip remains within the occluded segments of the common bile duct. The tube was cut and removed over a Bentson wire. A 8 5 French angled catheter was advanced over the wire and positioned at the common bile duct occlusion. A hydrophilic roadrunner wire was advanced across the occlusion on the 5 French catheter advanced across the occlusion into the duodenum. A gentle hand injection of contrast material confirmed the presence of the catheter within the duodenum. The hydrophilic wire was exchanged for a superstiff Amplatz wire. The 5 French catheter was removed. A 9 French sheath was advanced over the  wire and positioned in the common bile duct. A WallFlex 10 x 60 covered self expanding metal stent was then advanced across the lesion and deployed from the duodenum into the common bile duct. The stent was then post dilated using a 6 x 40 Mustang balloon. A gentle hand injection of contrast material through the sheath confirms antegrade flow throughout the stent and into the duodenum. There is rapid passage of contrast through the stent. The wire was brought back into the intrahepatic bile ducts and coiled at the intrahepatic biliary confluence. A new 10.2 all-purpose drainage catheter was then advanced over the wire and formed within the common bile duct. A final hand injection of contrast material and fluoroscopic saved image confirms the position of the stent and the external biliary drain. The drain was secured with an 0 Prolene suture and connected to bag drainage. IMPRESSION: 1. Successful placement of a 10 x 60 covered metallic self expanding WallFlex stent across the common bile  duct occlusion. 2. The internal/external biliary drainage catheter was removed and replaced with an external biliary drainage catheter positioned at the biliary confluence above the stent. PLAN: 1. Maintain biliary drain to gravity bag drainage until bilirubin has normalized. 2. Once bilirubin has trended downward significantly, removed the bag and cap the tube. The tube should be capped prior to discharge. 3. Return Interventional Radiology in 2-4 weeks for clinical evaluation and cholangiogram prior to removal of the biliary drain. Signed, Criselda Peaches, MD [facility] Rad[facility] Electronically Signed   By: Jacqulynn Cadet M.D.   On: 10/09/2016 17:40    CT images reviewed  Assessment and Plan:   1. Adenocarcinoma the pancreas-pancreas head/uncinate mass noted on a CT abdomen/pelvis 09/20/2016, status post an EUS biopsy 10/09/2016 confirming adenocarcinoma  CT 09/20/2016-pancreas head/uncinate mass, possible contact with the superior mesenteric artery, biliary dilatation, small low lung nodules  2. Biliary obstruction secondary to #1, status post placement of a wall stent and external biliary drain 10/09/2016  3.  Diabetes  4.  History of squamous cell carcinoma of the scalp  5.  Lumbar fusion surgery February 2018  6.  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  Abdominal/back pain secondary to pancreas cancer  8.  Cholangitis 10/07/2016   Ms.Macdonnell has been diagnosed with adenocarcinoma of the pancreas after presenting with obstructive jaundice. An external biliary drain and wall stent are in place.  I discussed the diagnosis and treatment options with Ms. Ginty and her family.  She appears to have locally advanced disease. I will present her case at the GI tumor conference within the next 2 weeks to help determine whether she may be a resection candidate. She will return for an  office visit and further discussion in approximately 2 weeks.  We will likely recommend additional staging evaluation to follow-up on the lung nodules   Recommendations: 1. Management of biliary drain per interventional radiology  2. Follow-up at the [facility] will be scheduled for approximately 2 weeks   Please call Oncology as needed  Betsy Coder, MD 10/10/2016, 5:17 PM

## 2016-10-10 NOTE — Progress Notes (Signed)
Referring Physician(s): Rande Brunt  Supervising Physician: Daryll Brod  Patient Status:  [facility] - In-pt  Chief Complaint:  Pancreatic cancer, biliary obstruction  Subjective: Pt doing ok ; still has some epig pain but diminished somewhat with vicodin; denies N/V; tol diet so far   Allergies: Patient has no known allergies.  Medications: Prior to Admission medications   Medication Sig Start Date End Date Taking? Authorizing Provider  amLODipine (NORVASC) 5 MG tablet Take 1 tablet (5 mg total) by mouth daily. Patient taking differently: Take 5 mg by mouth at bedtime.  07/18/16  Yes Mary-Margaret Hassell Done, FNP  amoxicillin-clavulanate (AUGMENTIN) 500-125 MG tablet Take 1 tablet (500 mg total) by mouth 2 (two) times daily. 10/02/16  Yes Verlee Monte, MD  benazepril (LOTENSIN) 40 MG tablet Take 1 tablet (40 mg total) by mouth at bedtime. 10/02/16  Yes Verlee Monte, MD  carvedilol (COREG) 25 MG tablet TAKE  (1)  TABLET TWICE A DAY WITH MEALS (BREAKFAST AND SUPPER) Patient taking differently: Take 25 mg by mouth 2 (two) times daily with a meal.  07/18/16  Yes Mary-Margaret Hassell Done, FNP  colestipol (COLESTID) 5 g packet Take 5 g by mouth 2 (two) times daily. 09/26/16  Yes Gatha Mayer, MD  diclofenac (VOLTAREN) 75 MG EC tablet Take 75 mg by mouth 2 (two) times daily as needed for mild pain.   Yes Historical Provider, MD  DULoxetine (CYMBALTA) 60 MG capsule Take 1 capsule (60 mg total) by mouth 2 (two) times daily. Patient taking differently: Take 60 mg by mouth daily.  07/18/16  Yes Mary-Margaret Hassell Done, FNP  furosemide (LASIX) 20 MG tablet Take 1 tablet (20 mg total) by mouth daily. Patient taking differently: Take 20 mg by mouth daily as needed for edema.  07/18/16  Yes Mary-Margaret Hassell Done, FNP  gabapentin (NEURONTIN) 300 MG capsule TAKE (1) CAPSULE TWICE DAILY. Patient taking differently: Take 300 mg by mouth at bedtime.  07/18/16  Yes Mary-Margaret Hassell Done, FNP  glipiZIDE-metformin (METAGLIP)  5-500 MG tablet Take 2 tablets by mouth 2 (two) times daily before a meal. 07/18/16  Yes Mary-Margaret Hassell Done, FNP  HYDROcodone-acetaminophen (NORCO) 7.5-325 MG tablet Take 1 tablet by mouth every 6 (six) hours as needed for moderate pain. 08/21/16  Yes Ashok Pall, MD  Insulin Glargine (TOUJEO SOLOSTAR) 300 UNIT/ML SOPN Inject 60 Units into the skin daily. Patient taking differently: Inject 40 Units into the skin at bedtime.  08/15/16  Yes Mary-Margaret Hassell Done, FNP  pantoprazole (PROTONIX) 40 MG tablet Take 1 tablet (40 mg total) by mouth daily before breakfast. 09/26/16  Yes Gatha Mayer, MD     Vital Signs: BP (!) 138/57 (BP Location: Left Arm)   Pulse 80   Temp 98.1 F (36.7 C) (Oral)   Resp 17   Ht 5\' 3"  (1.6 m)   Wt 196 lb 6.9 oz (89.1 kg)   SpO2 94%   BMI 34.80 kg/m   Physical Exam biliary drain intact, dressing intact and clean, site mildly tender, abd soft, sl dist; drain flushed without difficulty  Imaging: Ct Abdomen Pelvis W Contrast  Result Date: 10/07/2016 CLINICAL DATA:  Worsening generalized abdominal pain for 1 week. History of pancreatic mass post cholangiogram with biliary drainage placement. Draining green fluid. White cell count 17.6. Elevated lipase. History of hypertension and diabetes. EXAM: CT ABDOMEN AND PELVIS WITH CONTRAST TECHNIQUE: Multidetector CT imaging of the abdomen and pelvis was performed using the standard protocol following bolus administration of intravenous contrast. CONTRAST:  80 mL Isovue-300 COMPARISON:  09/20/2016 FINDINGS: Lower chest: Calcified granulomas in the left lung base. Nodule in the right lung base measuring 5 mm diameter, unchanged. Distal esophageal wall is thickened possibly indicating reflux or esophagitis. Hepatobiliary: Scattered calcified granulomas in the liver. Mild intrahepatic bile duct dilatation. Biliary drainage catheter is present, injuring through the left lobe of the liver. Distal catheter is demonstrated in the lower  common bile duct. Cholelithiasis with a large stone in the gallbladder. Small amount of gas in the gallbladder is likely related to catheter insertion. Pancreas: There is a mass in the head and uncinate process of the pancreas measuring up to 4 x 4.2 cm diameter. Central necrosis is present. The mass is causing obstruction of the distal common bile duct. Mass is contiguous with the duodenum and erect duodenal invasion is not excluded. The mass abuts the superior mesenteric artery and vein without circumferential involvement. No pancreatic ductal dilatation. Spleen: Calcified granulomas in the spleen. Adrenals/Urinary Tract: No adrenal gland nodules. Renal nephrograms are homogeneous and symmetrical. Small subcentimeter cysts. No hydronephrosis or hydroureter. Bladder wall is not thickened. Stomach/Bowel: Stomach and small bowel are decompressed. Scattered gas and stool in the colon. No colonic distention or inflammation. Scattered colonic diverticula. Appendix is not identified. Vascular/Lymphatic: Aortic atherosclerosis. No enlarged abdominal or pelvic lymph nodes. Reproductive: Status post hysterectomy. No adnexal masses. Other: No free air or free fluid in the abdomen. Abdominal wall musculature appears intact. Musculoskeletal: Degenerative changes in the lumbar spine. Postoperative changes with laminectomies, posterior rod and screw fixation, and intervertebral disc prostheses from L3 through L5. IMPRESSION: Mass in the head and uncinate process of the pancreas is again demonstrated, with associated obstruction of the distal common bile duct. Biliary drainage catheter is in place with tip in the distal common bile duct. Mild residual intrahepatic bile duct dilatation. Cholelithiasis. Calcified granulomas. Right lower lung nodules measuring up to 5 mm without change. Esophageal wall thickening possibly due to reflux or esophagitis. Electronically Signed   By: Lucienne Capers M.D.   On: 10/07/2016 06:32   Ir  Biliary Stent(s) Existing Access Inc Dilation Cath Exchange  Result Date: 10/09/2016 INDICATION: 71 year old female with 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 She currently has a left-sided internal/external biliary drainage catheter which has pulled back and may no longer be across the biliary occlusion. She presents for cholangiogram with possible biliary stent placement and possible biliary tube exchange. EXAM: 1. Cholangiogram through existing access 2. Placement of a metallic self expanding covered biliary stent 3. Exchange for a new 10 [place] external biliary drainage catheter MEDICATIONS: Patient is an inpatient already receiving intravenous antibiotics with 3.375 g Zosyn every 8 hours. No additional antibiotic prophylaxis was administered. ANESTHESIA/SEDATION: Due to a previous paradoxical reaction to Versed, 100 mcg fentanyl was administered intravenously for pain control during the procedure. Conscious sedation was not provided. FLUOROSCOPY TIME:  Fluoroscopy Time: 7 minutes 18 seconds (404.8 mGy). COMPLICATIONS: None immediate. PROCEDURE: Informed written consent was obtained from the patient after a thorough discussion of the procedural risks, benefits and alternatives. All questions were addressed. Maximal Sterile Barrier Technique was utilized including caps, mask, sterile gowns, sterile gloves, sterile drape, hand hygiene and skin antiseptic. A timeout was performed prior to the initiation of the procedure. A hand injection of contrast material was performed through the existing biliary drainage catheter. The drainage catheter has pulled back and the tip remains within the occluded segments of the common bile duct. The tube was cut and removed over a Bentson wire. A 8 5 [place]  angled catheter was advanced over the wire and positioned at the common bile duct occlusion. A hydrophilic roadrunner wire was advanced across the occlusion on the 5 French catheter advanced  across the occlusion into the duodenum. A gentle hand injection of contrast material confirmed the presence of the catheter within the duodenum. The hydrophilic wire was exchanged for a superstiff Amplatz wire. The 5 French catheter was removed. A 9 French sheath was advanced over the wire and positioned in the common bile duct. A WallFlex 10 x 60 covered self expanding metal stent was then advanced across the lesion and deployed from the duodenum into the common bile duct. The stent was then post dilated using a 6 x 40 Mustang balloon. A gentle hand injection of contrast material through the sheath confirms antegrade flow throughout the stent and into the duodenum. There is rapid passage of contrast through the stent. The wire was brought back into the intrahepatic bile ducts and coiled at the intrahepatic biliary confluence. A new 10.2 all-purpose drainage catheter was then advanced over the wire and formed within the common bile duct. A final hand injection of contrast material and fluoroscopic saved image confirms the position of the stent and the external biliary drain. The drain was secured with an 0 Prolene suture and connected to bag drainage. IMPRESSION: 1. Successful placement of a 10 x 60 covered metallic self expanding WallFlex stent across the common bile duct occlusion. 2. The internal/external biliary drainage catheter was removed and replaced with an external biliary drainage catheter positioned at the biliary confluence above the stent. PLAN: 1. Maintain biliary drain to gravity bag drainage until bilirubin has normalized. 2. Once bilirubin has trended downward significantly, removed the bag and cap the tube. The tube should be capped prior to discharge. 3. Return Interventional Radiology in 2-4 weeks for clinical evaluation and cholangiogram prior to removal of the biliary drain. Signed, Criselda Peaches, MD [facility] Radiology Electronically  Signed   By: Jacqulynn Cadet M.D.   On: 10/09/2016 17:40    Labs:  CBC:  Recent Labs  10/07/16 0135 10/08/16 0703 10/09/16 0609 10/10/16 0642  WBC 17.6* 10.2 6.7 8.2  HGB 12.0 9.7* 9.0* 8.8*  HCT 35.3* 29.2* 26.5* 26.7*  PLT 373 302 265 284    COAGS:  Recent Labs  09/20/16 1314 09/29/16 1350 10/09/16 0609  INR 0.86 0.90 1.03  APTT 33  --   --     BMP:  Recent Labs  10/03/16 1256 10/07/16 0135 10/08/16 0703 10/10/16 0642  NA 138 129* 137 139  K 5.1 4.2 3.5 3.7  CL 101 94* 107 111  CO2 21 25 21* 22  GLUCOSE 399* 412* 56* 98  BUN 21 16 12 11   CALCIUM 9.5 9.7 8.9 8.6*  CREATININE 1.53* 1.17* 1.12* 1.24*  GFRNONAA 34* 45* 48* 42*  GFRAA 39* 53* 55* 49*    LIVER FUNCTION TESTS:  Recent Labs  10/03/16 1256 10/07/16 0135 10/08/16 0703 10/10/16 0642  BILITOT 2.6* 4.3* 4.1* 3.5*  AST 29 43* 39 29  ALT 25 32 28 22  ALKPHOS 353* 333* 259* 225*  PROT 6.4 7.7 6.0* 5.5*  ALBUMIN 3.5 3.1* 2.3* 2.1*    Assessment and Plan: Pt with hx panc ca/biliary obstruction, s/p metallic biliary stent/exchange of existing I/E biliary drain 4/19; AF; WBC nl; hgb 8.8(9.0), K 3.7, creat 1.24(1.12), t bili 3.5(4.1); blood cx neg to date; cont drain irrigation and as per Dr. Katrinka Blazing note:  1. Maintain biliary drain to gravity bag drainage until bilirubin has normalized. 2. Once bilirubin has trended downward significantly, remove the bag and cap the tube. The tube should be capped prior to discharge. 3. Return Interventional Radiology in 2-4 weeks for clinical evaluation and cholangiogram prior to removal of the biliary drain   Electronically Signed: D. Rowe Robert 10/10/2016, 12:21 PM   I spent a total of 15 minutes  at the the patient's bedside AND on the patient's hospital floor or unit, greater than 50% of which was counseling/coordinating care for biliary drain    Patient ID: Patricia Austin, female   DOB: 07/22/1945, 71 y.o.   MRN: [id number]

## 2016-10-10 NOTE — Progress Notes (Signed)
Progress Note   Subjective  Chief Complaint: Epigastric abdominal pain Addl dx: Vomiting, Leukocytosis, s/p biliary drain placement, pancreatic adenocarcinoma  This morning the patient is found sitting up in bed, she tells me that she has continued epigastric abdominal discomfort but denies any nausea. She continues with regular bowel movements. She is asking when she will be able to go home. She denies any chest pain or dyspnea.    Objective   Vital signs in last 24 hours: Temp:  [97.8 F (36.6 C)-98.9 F (37.2 C)] 98.1 F (36.7 C) (04/20 0824) Pulse Rate:  [74-89] 80 (04/20 0824) Resp:  [9-25] 17 (04/20 0824) BP: (138-186)/(57-158) 138/57 (04/20 0824) SpO2:  [93 %-100 %] 94 % (04/20 0824) Weight:  [196 lb 6.9 oz (89.1 kg)] 196 lb 6.9 oz (89.1 kg) (04/20 0452) Last BM Date: 10/09/16 General: Caucasian female in NAD Heart:  Regular rate and rhythm; no murmurs Lungs: Respirations even and unlabored, lungs CTA bilaterally Abdomen:  Soft, mild generalized abdominal pain, worse in epigastrum, mild distension. PTC drain in place Normal bowel sounds. Extremities:  Without edema. Neurologic:  Alert and oriented,  grossly normal neurologically. Psych:  Cooperative. Normal mood and affect.  Intake/Output from previous day: 04/19 0701 - 04/20 0700 In: 1605 [P.O.:600; I.V.:900; IV Piggyback:100] Out: 125 [Drains:125] Intake/Output this shift: Total I/O In: 240 [P.O.:240] Out: -   Lab Results:  Recent Labs  10/08/16 0703 10/09/16 0609 10/10/16 0642  WBC 10.2 6.7 8.2  HGB 9.7* 9.0* 8.8*  HCT 29.2* 26.5* 26.7*  PLT 302 265 284   BMET  Recent Labs  10/08/16 0703 10/10/16 0642  NA 137 139  K 3.5 3.7  CL 107 111  CO2 21* 22  GLUCOSE 56* 98  BUN 12 11  CREATININE 1.12* 1.24*  CALCIUM 8.9 8.6*   LFT  Recent Labs  10/10/16 0642  PROT 5.5*  ALBUMIN 2.1*  AST 29  ALT 22  ALKPHOS 225*  BILITOT 3.5*   PT/INR  Recent Labs  10/09/16 0609  LABPROT 13.6    INR 1.03    Studies/Results: Ir Biliary Stent(s) Existing Access Inc Dilation Cath Exchange  Result Date: 10/09/2016 INDICATION: 71 year old female with 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 She currently has a left-sided internal/external biliary drainage catheter which has pulled back and may no longer be across the biliary occlusion. She presents for cholangiogram with possible biliary stent placement and possible biliary tube exchange. EXAM: 1. Cholangiogram through existing access 2. Placement of a metallic self expanding covered biliary stent 3. Exchange for a new 10 [place] external biliary drainage catheter MEDICATIONS: Patient is an inpatient already receiving intravenous antibiotics with 3.375 g Zosyn every 8 hours. No additional antibiotic prophylaxis was administered. ANESTHESIA/SEDATION: Due to a previous paradoxical reaction to Versed, 100 mcg fentanyl was administered intravenously for pain control during the procedure. Conscious sedation was not provided. FLUOROSCOPY TIME:  Fluoroscopy Time: 7 minutes 18 seconds (404.8 mGy). COMPLICATIONS: None immediate. PROCEDURE: Informed written consent was obtained from the patient after a thorough discussion of the procedural risks, benefits and alternatives. All questions were addressed. Maximal Sterile Barrier Technique was utilized including caps, mask, sterile gowns, sterile gloves, sterile drape, hand hygiene and skin antiseptic. A timeout was performed prior to the initiation of the procedure. A hand injection of contrast material was performed through the existing biliary drainage catheter. The drainage catheter has pulled back and the tip remains within the occluded segments of the common bile duct. The tube was cut  and removed over a Bentson wire. A 8 5 French angled catheter was advanced over the wire and positioned at the common bile duct occlusion. A hydrophilic roadrunner wire was advanced across the occlusion  on the 5 French catheter advanced across the occlusion into the duodenum. A gentle hand injection of contrast material confirmed the presence of the catheter within the duodenum. The hydrophilic wire was exchanged for a superstiff Amplatz wire. The 5 French catheter was removed. A 9 French sheath was advanced over the wire and positioned in the common bile duct. A WallFlex 10 x 60 covered self expanding metal stent was then advanced across the lesion and deployed from the duodenum into the common bile duct. The stent was then post dilated using a 6 x 40 Mustang balloon. A gentle hand injection of contrast material through the sheath confirms antegrade flow throughout the stent and into the duodenum. There is rapid passage of contrast through the stent. The wire was brought back into the intrahepatic bile ducts and coiled at the intrahepatic biliary confluence. A new 10.2 all-purpose drainage catheter was then advanced over the wire and formed within the common bile duct. A final hand injection of contrast material and fluoroscopic saved image confirms the position of the stent and the external biliary drain. The drain was secured with an 0 Prolene suture and connected to bag drainage. IMPRESSION: 1. Successful placement of a 10 x 60 covered metallic self expanding WallFlex stent across the common bile duct occlusion. 2. The internal/external biliary drainage catheter was removed and replaced with an external biliary drainage catheter positioned at the biliary confluence above the stent. PLAN: 1. Maintain biliary drain to gravity bag drainage until bilirubin has normalized. 2. Once bilirubin has trended downward significantly, removed the bag and cap the tube. The tube should be capped prior to discharge. 3. Return Interventional Radiology in 2-4 weeks for clinical evaluation and cholangiogram prior to removal of the biliary drain. Signed, Criselda Peaches, MD [facility]  [facility] Radiology Electronically Signed   By: Jacqulynn Cadet M.D.   On: 10/09/2016 17:40   EUS 10/09/16-Dr. Ardis Hughs Findings:      Endoscopic Finding :      Normal esophagus and stomach      The duodenum was very tortuous, spastic begining just distal to the       duodenal bulb. 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 I       could not visualize the internal portion of the recently placed bile       duct drain however could not visualize the papilla either (with radial       echoendoscope or standard ERCP side viewing scope).      Endosonographic Finding :      1. An irregular mass was identified in the head/uncinate process of the       pancreas. The mass was hypoechoic and heterogenous. The mass measured 38       mm in maximal cross-sectional diameter. The outer margins were       irregular. Fine needle aspiration for cytology was performed. Color       Doppler imaging was utilized prior to needle puncture to confirm a lack       of significant vascular structures within the needle path. Three passes       were made with the 25 gauge needle using a transduodenal approach. A       stylet was used. A cytotechnologist was  present to evaluate the adequacy       of the specimen. Final cytology results are pending.      2. There was significant anatomic distortion and air artifact related to       the duodenal diverticulum, tortuous and spastic duodenum that I could       not evaluate for vascular involvement.      3. Gallbladder contains a large shadowing gallstone.      4. Limited views of liver, spleen, portal and splenic vessels were all       normal. Impression:               - 3.8cm solid mass in pancreatic head/uncinate.                            Preliminary cytology review is positive for                            malignancy (adenocarcinoma).                           - There was significant anatomic distortion of the                            duodenum related to  diverticulum, spasticity. Air                            artifact also precluded evaluation of vascular                            structures. I could not visualize the distal end of                            the bile duct drain. After reviewing her most                            recent CT I am not sure that the distal end of the                            drain is still within the duodenum and recommend                            evaluation by IR (drain check), can consider                            placing a metal stent at the same time. Moderate Sedation:      N/A- Per Anesthesia Care Recommendation:           - Return patient to hospital ward for ongoing care.                           - Advance diet as tolerated.                           - Continue present medications.    Assessment / Plan:    Assessment: 1.  Pancreatic Adenocarcinoma with biliary obstruction and cholangitis:The patient now status post EUS on 10/09/16 with preliminary results of adenocarcinoma of the pancreas, patient also had metal stent placement by IR yesterday and plans are for removal of her external biliary drain before she is discharged  Plan: 1. Oncology was consulted yesterday and vocalized that they would contact patient and make outpatient appt. Apparently she has not heard from them yet, this will need to be followed up if she does not hear from them today. 2. Continue supportive measures 3. Per IR-plans to watch Bilirubin to ensure drain is working before d/c 4. Please await any further recs from Dr. Loletha Carrow later this afternoon  Thank you for your kind consultation   LOS: 2 days   Levin Erp  10/10/2016, 11:56 AM  Pager # [phone]  I have discussed the case with the PA, and that is the plan I formulated. I personally interviewed and examined the patient.  Bilirubin better today than 2 days ago.  IR placed metal stent and wants tube capped prior to discharge.  Patient has  manageable epigastric pain.  I have ordered a hepatic function panel for the AM.  If stable or improving, patient can go home on 3 more days of Augmentin 875/125, one tablet twice daily as well as ciprofloxacin 500 mg , one tablet twice daily.  She does not need follow up in our office as she has oncology eval soon.  Next week our office will help arrange IR clinic follow up.    Nelida Meuse III Pager [phone]  Mon-Fri 8a-5p [phone] after 5p, weekends, holidays

## 2016-10-11 ENCOUNTER — Other Ambulatory Visit: Payer: Self-pay | Admitting: Radiology

## 2016-10-11 DIAGNOSIS — C258 Malignant neoplasm of overlapping sites of pancreas: Secondary | ICD-10-CM

## 2016-10-11 DIAGNOSIS — C259 Malignant neoplasm of pancreas, unspecified: Secondary | ICD-10-CM

## 2016-10-11 LAB — GLUCOSE, CAPILLARY: Glucose-Capillary: 88 mg/dL (ref 65–99)

## 2016-10-11 LAB — HEPATIC FUNCTION PANEL
ALBUMIN: 2.1 g/dL — AB (ref 3.5–5.0)
ALK PHOS: 222 U/L — AB (ref 38–126)
ALT: 22 U/L (ref 14–54)
AST: 29 U/L (ref 15–41)
Bilirubin, Direct: 2.1 mg/dL — ABNORMAL HIGH (ref 0.1–0.5)
Indirect Bilirubin: 1.4 mg/dL — ABNORMAL HIGH (ref 0.3–0.9)
TOTAL PROTEIN: 5.8 g/dL — AB (ref 6.5–8.1)
Total Bilirubin: 3.5 mg/dL — ABNORMAL HIGH (ref 0.3–1.2)

## 2016-10-11 MED ORDER — CIPROFLOXACIN HCL 500 MG PO TABS: 500.0000 mg | ORAL_TABLET | Freq: Two times a day (BID) | ORAL | 0 refills | Status: AC

## 2016-10-11 MED ORDER — CIPROFLOXACIN HCL 500 MG PO TABS: 500.0000 mg | ORAL_TABLET | Freq: Two times a day (BID) | ORAL | Status: DC

## 2016-10-11 MED ORDER — AMOXICILLIN-POT CLAVULANATE 500-125 MG PO TABS: 1.0000 | ORAL_TABLET | Freq: Two times a day (BID) | ORAL | 0 refills | Status: AC

## 2016-10-11 NOTE — Progress Notes (Signed)
I have checked the patient's AM labs while I am at [facility] hospital for procedures this AM.  LFTs similar to yesterday.  IR recommended that when the INR is normal (or at least significantly improved), the drain tube can be capped for patient discharge.  While I expect it would take at least several days more for the Alk Phos and bilirubin to significantly improve and the bilirubin to become normal or near normal, I agree that it should be better than it is now for the tube to be capped.  The metal stent may take several days to become fully expanded/deployed and allow internal biliary drainage.  Given the repeated hospitalizations for infection, I think it would be prudent to keep the PTC to a gravity drainage bag for perhaps another 2-3 days until bilirubin significantly lower.  She is otherwise ready to go home on Abx as advised in my note last evening.  Please ask IR if patient could go home with drainage bag as she did recently rather than have it capped.  I do not know how/if it needs to be flushed or if patient was doing that/knows how.  Whether she goes home with drainage bag or without, our office would arrange to check her LFTs Monday.

## 2016-10-11 NOTE — Progress Notes (Signed)
Progress Note   Subjective  Chief Complaint:Epigastric abdominal pain Additional diagnoses: Vomiting, leukocytosis, status post biliary drain placement, pancreatic adenocarcinoma  Today, the patient is found laying in bed comfortably, she has been eating a regular diet and tells me that she has continued epigastric abdominal pain, with no change from previous. She does ask questions today regarding during urinary incontinence, apparently she was meaning to make an appointment with urology and questions whether "they will be able to do anything anyways". She tells me that she did not have to flush her biliary drain at home previously, so is unsure how to do this going forward. She is excited at the aspect of being able to go home.    Objective   Vital signs in last 24 hours: Temp:  [98 F (36.7 C)-98.3 F (36.8 C)] 98.3 F (36.8 C) (04/21 0504) Pulse Rate:  [78-84] 84 (04/21 0504) Resp:  [17-19] 19 (04/21 0504) BP: (154-169)/(63-86) 163/64 (04/21 0504) SpO2:  [95 %-98 %] 95 % (04/21 0504) Weight:  [195 lb 12.8 oz (88.8 kg)] 195 lb 12.8 oz (88.8 kg) (04/21 0504) Last BM Date: 10/11/16 General: Caucasian female in NAD Heart:  Regular rate and rhythm; no murmurs Lungs: Respirations even and unlabored, lungs CTA bilaterally Abdomen:  Soft, mild generalized abdominal pain, somewhat worse in the epigastrium, mild distention, PTC drain in place. Normal bowel sounds Extremities:  Without edema. Neurologic:  Alert and oriented,  grossly normal neurologically. Psych:  Cooperative. Normal mood and affect.  Intake/Output from previous day: 04/20 0701 - 04/21 0700 In: 76 [P.O.:720] Out: 175 [Drains:175] Intake/Output this shift: Total I/O In: 5 [Other:5] Out: 35 [Drains:35]  Lab Results:  Recent Labs  10/09/16 0609 10/10/16 0642  WBC 6.7 8.2  HGB 9.0* 8.8*  HCT 26.5* 26.7*  PLT 265 284   BMET  Recent Labs  10/10/16 0642  NA 139  K 3.7  CL 111  CO2 22  GLUCOSE 98    BUN 11  CREATININE 1.24*  CALCIUM 8.6*   LFT  Recent Labs  10/11/16 0601  PROT 5.8*  ALBUMIN 2.1*  AST 29  ALT 22  ALKPHOS 222*  BILITOT 3.5*  BILIDIR 2.1*  IBILI 1.4*   PT/INR  Recent Labs  10/09/16 0609  LABPROT 13.6  INR 1.03    Studies/Results: Ir Biliary Stent(s) Existing Access Inc Dilation Cath Exchange  Result Date: 10/09/2016 INDICATION: 71 year old female with 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 She currently has a left-sided internal/external biliary drainage catheter which has pulled back and may no longer be across the biliary occlusion. She presents for cholangiogram with possible biliary stent placement and possible biliary tube exchange. EXAM: 1. Cholangiogram through existing access 2. Placement of a metallic self expanding covered biliary stent 3. Exchange for a new 10 [place] external biliary drainage catheter MEDICATIONS: Patient is an inpatient already receiving intravenous antibiotics with 3.375 g Zosyn every 8 hours. No additional antibiotic prophylaxis was administered. ANESTHESIA/SEDATION: Due to a previous paradoxical reaction to Versed, 100 mcg fentanyl was administered intravenously for pain control during the procedure. Conscious sedation was not provided. FLUOROSCOPY TIME:  Fluoroscopy Time: 7 minutes 18 seconds (404.8 mGy). COMPLICATIONS: None immediate. PROCEDURE: Informed written consent was obtained from the patient after a thorough discussion of the procedural risks, benefits and alternatives. All questions were addressed. Maximal Sterile Barrier Technique was utilized including caps, mask, sterile gowns, sterile gloves, sterile drape, hand hygiene and skin antiseptic. A timeout was performed prior to the initiation of  the procedure. A hand injection of contrast material was performed through the existing biliary drainage catheter. The drainage catheter has pulled back and the tip remains within the occluded segments of  the common bile duct. The tube was cut and removed over a Bentson wire. A 8 5 French angled catheter was advanced over the wire and positioned at the common bile duct occlusion. A hydrophilic roadrunner wire was advanced across the occlusion on the 5 French catheter advanced across the occlusion into the duodenum. A gentle hand injection of contrast material confirmed the presence of the catheter within the duodenum. The hydrophilic wire was exchanged for a superstiff Amplatz wire. The 5 French catheter was removed. A 9 French sheath was advanced over the wire and positioned in the common bile duct. A WallFlex 10 x 60 covered self expanding metal stent was then advanced across the lesion and deployed from the duodenum into the common bile duct. The stent was then post dilated using a 6 x 40 Mustang balloon. A gentle hand injection of contrast material through the sheath confirms antegrade flow throughout the stent and into the duodenum. There is rapid passage of contrast through the stent. The wire was brought back into the intrahepatic bile ducts and coiled at the intrahepatic biliary confluence. A new 10.2 all-purpose drainage catheter was then advanced over the wire and formed within the common bile duct. A final hand injection of contrast material and fluoroscopic saved image confirms the position of the stent and the external biliary drain. The drain was secured with an 0 Prolene suture and connected to bag drainage. IMPRESSION: 1. Successful placement of a 10 x 60 covered metallic self expanding WallFlex stent across the common bile duct occlusion. 2. The internal/external biliary drainage catheter was removed and replaced with an external biliary drainage catheter positioned at the biliary confluence above the stent. PLAN: 1. Maintain biliary drain to gravity bag drainage until bilirubin has normalized. 2. Once bilirubin has trended downward significantly, removed the bag and cap the tube. The tube should be  capped prior to discharge. 3. Return Interventional Radiology in 2-4 weeks for clinical evaluation and cholangiogram prior to removal of the biliary drain. Signed, Criselda Peaches, MD [facility] Electronically Signed   By: Jacqulynn Cadet M.D.   On: 10/09/2016 17:40    Assessment / Plan:   Assessment: 1. 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 Discussed case with Dr. Neldon Mc from IR. They are okay with the patient going home today with external biliary drain/bag in place, uncapped. Patient would need to flush this 2-3 times per week. 2. Flushing of the drain was reviewed with Darrell Allred, PA-C for IR as I was leaving the room. Patient will be provided with equipment to do this from the hospitalist. 3. Ordered repeat LFTs to be drawn in our clinic on Monday, 10/13/16. Patient is aware. This will be on her discharge summary. 4. Patient was advised to call us or the IR clinic at [phone] if she has any trouble between now and  her appointment with them in 1-2 weeks. They will be calling her to arrange appointment. 5. Patient needs to remain on antibiotics. Per Dr. Loletha Carrow this would be for 3 more days of Augmentin 875/125, 1 tablet twice daily as well as ciprofloxacin 500 mg 1 tablet twice daily. 6. The patient does not need follow-up in our office as she has oncology appointment set up soon. Oncology did speak with her yesterday in regards to this. 7. Did discuss this case with Dr. Loletha Carrow,  please contact us if you have any further questions.  Thank you for your kind consultation, we will sign off.   LOS: 3 days   Levin Erp  10/11/2016, 9:13 AM  Pager # [phone]

## 2016-10-11 NOTE — Discharge Instructions (Signed)
Patricia Austin,  You were admitted to the hospital because of your abdominal pain. You had your mass biopsied. Results will be reviewed as an outpatient. Your drain was replaced and instructions to flush it every 1-2 days in addition to measuring output every 1-2 days. Please follow-up with gastroenterology and interventional radiology.

## 2016-10-11 NOTE — Discharge Summary (Signed)
Physician Discharge Summary  Patricia Austin FTD:[id number] DOB: [birth year] DOA: 10/07/2016  PCP: Chevis Pretty, FNP  Admit date: 10/07/2016 Discharge date: 10/11/2016  Admitted From: Home Disposition: Home  Recommendations for Outpatient Follow-up:  1. Follow up with PCP in 1 week 2. Follow up with Gastroenterology for biopsy results and repeat CMP 3. Follow up with Interventional Radiology for management of drain 4. Follow up with Dr. Benay Spice, Oncology 5. Follow-up: pancreatic mass biopsy, cultures from biliary drain, blood cultures  Equipment/Devices: External biliary drain  Discharge Condition: Stable CODE STATUS: Full code Diet recommendation: Regular diet   Brief/Interim Summary:  Admission HPI written by Elmarie Shiley, MD   Chief Complaint: Abdominal pain, nausea, vomiting.   HPI: Patricia Austin is a 71 y.o. female with medical history significant of  DM, Obesity, CKD stage III, pancreatic Mass, Patient was admitted 4-09 from interventional radiology following PTC drain placement for observation, she subsequently develops fever 103, leukocytosis, she was discharge 4-12  on Augmentin. Biopsy has not be able to be obtain. Patient presents with persistent abdominal pain, discomfort, difficult to describe pain. Also now complaining of back pain. He has history of back fusion. She report persistent nausea, not eating, poor oral intake. She missed her antibiotics dose yesterday.   ED Course: Presents with persistent symptoms, nausea, vomiting, found to have hyponatremia, hemoconcentration hb at 12, increase bilirubin, CBG 412, Alkaline phosphatase 333, lipase 78, AST 43, WBC 17, UA; WBC 0-5.  CT abdomen, pelvis; Mass in the head and uncinate process of the pancreas is again demonstrated, with associated obstruction of the distal common bile duct. Biliary drainage catheter is in place with tip in the distal common bile duct. Mild residual intrahepatic bile duct dilatation.  Cholelithiasis. Calcified granulomas. Right lower lung nodules measuring up to 5 mm without change. Esophageal wall thickening possibly due to reflux or esophagitis.   Hospital course:  Pancreatitic mass Biliary obstruction Cholangitis Patient's abdominal pain improved with analgesic therapy. Patient afebrile. EUS performed on 4/19. Interventional radiology performed cholangiogram, placement of metal biliary stent and exchange of biliary drain. Patient on Zosyn and transitioned to Augmentin and Ciprofloxacin at discharge. Bilirubin improved slightly s/p stent placement. Transaminases improved with stent.  Dehydration Hyponatremia Resolved with IV fluids.  Essential hypertension Continued benazepril, Norvasc and Coreg.  Diabetes mellitus, type 2 SSI while inpatient. Resume home regimen at discharge.  GERD Continued Protonix  Peripheral neuropathy Continued gabapentin  Discharge Diagnoses:  Active Problems:   Diabetes ([place])   Morbid obesity ([facility])   Hypertension   GERD (gastroesophageal reflux disease)   CKD (chronic kidney disease) stage 3, GFR 30-59 ml/min   Pancreatic mass   Biliary obstruction   Hyponatremia   Abdominal pain   Pancreatic cancer [facility])    Discharge Instructions  Discharge Instructions    Call MD for:  difficulty breathing, headache or visual disturbances    Complete by:  As directed    Call MD for:  persistant dizziness or light-headedness    Complete by:  As directed    Call MD for:  persistant nausea and vomiting    Complete by:  As directed    Call MD for:  severe uncontrolled pain    Complete by:  As directed    Call MD for:  temperature >100.4    Complete by:  As directed      Allergies as of 10/11/2016   No Known Allergies     Medication List    STOP taking these medications  diclofenac 75 MG EC tablet Commonly known as:  VOLTAREN   furosemide 20 MG tablet Commonly known as:  LASIX     TAKE these medications   amLODipine  5 MG tablet Commonly known as:  NORVASC Take 1 tablet (5 mg total) by mouth daily. What changed:  when to take this   amoxicillin-clavulanate 500-125 MG tablet Commonly known as:  AUGMENTIN Take 1 tablet (500 mg total) by mouth 2 (two) times daily.   benazepril 40 MG tablet Commonly known as:  LOTENSIN Take 1 tablet (40 mg total) by mouth at bedtime.   carvedilol 25 MG tablet Commonly known as:  COREG TAKE  (1)  TABLET TWICE A DAY WITH MEALS (BREAKFAST AND SUPPER) What changed:  how much to take  how to take this  when to take this  additional instructions   ciprofloxacin 500 MG tablet Commonly known as:  CIPRO Take 1 tablet (500 mg total) by mouth 2 (two) times daily.   colestipol 5 g packet Commonly known as:  COLESTID Take 5 g by mouth 2 (two) times daily.   DULoxetine 60 MG capsule Commonly known as:  CYMBALTA Take 1 capsule (60 mg total) by mouth 2 (two) times daily. What changed:  when to take this   gabapentin 300 MG capsule Commonly known as:  NEURONTIN TAKE (1) CAPSULE TWICE DAILY. What changed:  how much to take  how to take this  when to take this  additional instructions   glipiZIDE-metformin 5-500 MG tablet Commonly known as:  METAGLIP Take 2 tablets by mouth 2 (two) times daily before a meal.   HYDROcodone-acetaminophen 7.5-325 MG tablet Commonly known as:  NORCO Take 1 tablet by mouth every 6 (six) hours as needed for moderate pain.   Insulin Glargine 300 UNIT/ML Sopn Commonly known as:  TOUJEO SOLOSTAR Inject 60 Units into the skin daily. What changed:  how much to take  when to take this   pantoprazole 40 MG tablet Commonly known as:  PROTONIX Take 1 tablet (40 mg total) by mouth daily before breakfast.      Follow-up Information    [facility] Follow up on 10/13/2016.   Specialty:  Gastroenterology Why:  Proceed to the lab on the lowest floor for repeat hepatic function panel and CBC. Anytime between  7:30am-4:30 pm. Thank you-JLL Contact information: [place] [id number] [place], [place], [place] Follow up.   Specialty:  Interventional Radiology Why:  Interventional radiology will be calling you with an appt within the next 1-2 weeks. If you have problems with your drain before then, please feel free to call their office at number listed above. Contact information: [address] [place] [address] [phone]        Momeyer, FNP. Schedule an appointment as soon as possible for a visit in 1 week(s).   Specialty:  Family Medicine Contact information: [place] [address] [phone]          No Known Allergies  Consultations:  Gastroenterology  Interventional radiology   Procedures/Studies: Ct Head Wo Contrast  Result Date: 09/29/2016 CLINICAL DATA:  Altered mental status EXAM: CT HEAD WITHOUT CONTRAST TECHNIQUE: Contiguous axial images were obtained from the base of the skull through the vertex without intravenous contrast. COMPARISON:  Head CT 09/20/2016 FINDINGS: Despite efforts by the technologist and patient, motion artifact is present on today's examination and could not be eliminated. The  findings of this study are interpreted in the context of that limitation. Brain: Within the above limitation, there is no intracranial hemorrhage or mass effect identified. Skull: Normal visualized skull base, calvarium and extracranial soft tissues. Sinuses/Orbits: No sinus fluid levels or advanced mucosal thickening. No mastoid effusion. Normal orbits. IMPRESSION: 1. Severely motion degraded examination. 2. Within that limitation, no acute intracranial abnormality is visualized. Electronically Signed   By: Ulyses Jarred M.D.   On: 09/29/2016 23:54   Ct Head Wo Contrast  Result Date: 09/20/2016 CLINICAL DATA:  Weakness. Left-sided numbness and tingling for 3 days. History of  diabetes and hypertension. EXAM: CT HEAD WITHOUT CONTRAST TECHNIQUE: Contiguous axial images were obtained from the base of the skull through the vertex without intravenous contrast. COMPARISON:  06/02/2013 FINDINGS: Brain: There is no evidence of acute cortical infarct, intracranial hemorrhage, mass, midline shift, or extra-axial fluid collection. Mild cerebral atrophy may have minimally progressed. Periventricular white matter hypodensities also appear slightly increased and are nonspecific but compatible with mild chronic small vessel ischemic disease. Vascular: Mild calcified atherosclerosis at the skullbase. No hyperdense vessel. Skull: No fracture or focal osseous lesion. Sinuses/Orbits: Visualized paranasal sinuses are clear. Minimal chronic air cell opacification at the mastoid tips. Unremarkable orbits. Other: None. IMPRESSION: 1. No evidence of acute intracranial abnormality. 2. Mild chronic small vessel ischemic disease. Electronically Signed   By: Logan Bores M.D.   On: 09/20/2016 13:48   Ct Abdomen Pelvis W Contrast  Result Date: 10/07/2016 CLINICAL DATA:  Worsening generalized abdominal pain for 1 week. History of pancreatic mass post cholangiogram with biliary drainage placement. Draining green fluid. White cell count 17.6. Elevated lipase. History of hypertension and diabetes. EXAM: CT ABDOMEN AND PELVIS WITH CONTRAST TECHNIQUE: Multidetector CT imaging of the abdomen and pelvis was performed using the standard protocol following bolus administration of intravenous contrast. CONTRAST:  80 mL Isovue-300 COMPARISON:  09/20/2016 FINDINGS: Lower chest: Calcified granulomas in the left lung base. Nodule in the right lung base measuring 5 mm diameter, unchanged. Distal esophageal wall is thickened possibly indicating reflux or esophagitis. Hepatobiliary: Scattered calcified granulomas in the liver. Mild intrahepatic bile duct dilatation. Biliary drainage catheter is present, injuring through the left  lobe of the liver. Distal catheter is demonstrated in the lower common bile duct. Cholelithiasis with a large stone in the gallbladder. Small amount of gas in the gallbladder is likely related to catheter insertion. Pancreas: There is a mass in the head and uncinate process of the pancreas measuring up to 4 x 4.2 cm diameter. Central necrosis is present. The mass is causing obstruction of the distal common bile duct. Mass is contiguous with the duodenum and erect duodenal invasion is not excluded. The mass abuts the superior mesenteric artery and vein without circumferential involvement. No pancreatic ductal dilatation. Spleen: Calcified granulomas in the spleen. Adrenals/Urinary Tract: No adrenal gland nodules. Renal nephrograms are homogeneous and symmetrical. Small subcentimeter cysts. No hydronephrosis or hydroureter. Bladder wall is not thickened. Stomach/Bowel: Stomach and small bowel are decompressed. Scattered gas and stool in the colon. No colonic distention or inflammation. Scattered colonic diverticula. Appendix is not identified. Vascular/Lymphatic: Aortic atherosclerosis. No enlarged abdominal or pelvic lymph nodes. Reproductive: Status post hysterectomy. No adnexal masses. Other: No free air or free fluid in the abdomen. Abdominal wall musculature appears intact. Musculoskeletal: Degenerative changes in the lumbar spine. Postoperative changes with laminectomies, posterior rod and screw fixation, and intervertebral disc prostheses from L3 through L5. IMPRESSION: Mass in the head and uncinate process of  the pancreas is again demonstrated, with associated obstruction of the distal common bile duct. Biliary drainage catheter is in place with tip in the distal common bile duct. Mild residual intrahepatic bile duct dilatation. Cholelithiasis. Calcified granulomas. Right lower lung nodules measuring up to 5 mm without change. Esophageal wall thickening possibly due to reflux or esophagitis. Electronically  Signed   By: Lucienne Capers M.D.   On: 10/07/2016 06:32   Ct Abdomen Pelvis W Contrast  Result Date: 09/20/2016 CLINICAL DATA:  Transaminitis. Dysuria and lower abdominal pain. Lumbar spine surgery 1 month ago. EXAM: CT ABDOMEN AND PELVIS WITH CONTRAST TECHNIQUE: Multidetector CT imaging of the abdomen and pelvis was performed using the standard protocol following bolus administration of intravenous contrast. CONTRAST:  80 mL Isovue 300 COMPARISON:  None. FINDINGS: Lower chest: 5 mm right lower lobe lung nodule (series 4, image 12). 3 mm right lower lobe nodule (series 4, image 14). 5 mm calcified nodule in the left lower lobe. No lung consolidation or pleural effusion. Hepatobiliary: Diffusely decreased attenuation of the liver suggesting steatosis. Scattered calcified granulomas in the liver. Mild intrahepatic biliary dilatation. Common bile duct dilatation measuring 12 mm. 3.4 cm stone in the gallbladder without evidence of acute cholecystitis. Pancreas: There is an approximately 4.0 x 3.1 x 3.9 cm heterogeneously hypoenhancing mass at the level of the uncinate process of the pancreas. This may contact the posterior margin of the adjacent superior mesenteric artery and partially encases some small adjacent SMA branch vessels. There is not a substantial enough amount of surrounding stranding to suggest that this represents pancreatitis rather than neoplasm. There is pancreatic atrophy without ductal dilatation. Spleen: Calcified granulomas in the spleen. Adrenals/Urinary Tract: Unremarkable adrenal glands. No evidence of renal calculi or hydronephrosis. Subcentimeter low-density renal lesions bilaterally are too small to fully characterize but most likely represent cysts. Unremarkable bladder. Stomach/Bowel: Apparent mild diffuse wall thickening of the visualized distal esophagus. The stomach is within normal limits. Duodenal diverticular noted. No evidence of bowel obstruction. Mild colonic diverticulosis  without evidence of diverticulitis. Prior appendectomy. Vascular/Lymphatic: Minimal atherosclerotic plaque in the abdominal aorta without aneurysm. Small portacaval lymph nodes measure up to 7 mm in short axis. Reproductive: Prior hysterectomy.  No adnexal mass. Other: No intraperitoneal free fluid. Musculoskeletal: Postoperative changes from recent L3-L5 PLIF. IMPRESSION: 1. 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 Abdominal MRI is recommended for further evaluation. 2. Associated mild intrahepatic and extrahepatic biliary dilatation. 3. Small right lower lobe lung nodules measuring up to 5 mm, indeterminate. Metastatic disease is not excluded. 4. Cholelithiasis. 5. Hepatic steatosis. 6. Distal esophageal wall thickening, query esophagitis. Electronically Signed   By: Logan Bores M.D.   On: 09/20/2016 21:13   Dg Chest Port 1 View  Result Date: 09/30/2016 CLINICAL DATA:  Fever EXAM: PORTABLE CHEST 1 VIEW COMPARISON:  Chest radiograph 08/20/2016 FINDINGS: Cardiomediastinal silhouette remains mildly enlarged. There are diffuse peribronchial opacities, likely indicating pulmonary edema. Unchanged left basilar calcified granuloma. No pneumothorax or sizable pleural effusion. IMPRESSION: Shallow lung inflation with mild pulmonary edema. Electronically Signed   By: Ulyses Jarred M.D.   On: 09/30/2016 04:49   Dg C-arm 1-60 Min-no Report  Result Date: 09/26/2016 Fluoroscopy was utilized by the requesting physician.  No radiographic interpretation.   Ir Int Lianne Cure Biliary Drain With Cholangiogram  Result Date: 09/29/2016 INDICATION: 71 year old with pancreatic mass and biliary obstruction. Patient needs a tissue diagnosis and biliary decompression. Scheduled for PTC with biliary  drain placement. EXAM: PLACEMENT OF AN INTERNAL/EXTERNAL BILIARY DRAIN WITH ULTRASOUND AND FLUOROSCOPIC GUIDANCE MEDICATIONS: Zosyn 3.375 g; The antibiotic was  administered within an appropriate time frame prior to the initiation of the procedure. Hydralazine 10 mg ANESTHESIA/SEDATION: Moderate (conscious) sedation was employed during this procedure. A total of Versed 3.0 mg and Fentanyl 100 mcg was administered intravenously. Moderate Sedation Time: 27 minutes. The patient's level of consciousness and vital signs were monitored continuously by radiology nursing throughout the procedure under my direct supervision. FLUOROSCOPY TIME:  Fluoroscopy Time: 1 minutes and 24 seconds, 59 mGy CONTRAST:  20 mL [id number] COMPLICATIONS: None immediate. PROCEDURE: Informed written consent was obtained from the patient after a thorough discussion of the procedural risks, benefits and alternatives. All questions were addressed. Maximal Sterile Barrier Technique was utilized including caps, mask, sterile gowns, sterile gloves, sterile drape, hand hygiene and skin antiseptic. A timeout was performed prior to the initiation of the procedure. Patient was placed supine. The anterior and right side of the abdomen were prepped and draped in a sterile fashion. The liver was evaluated with ultrasound. Mildly dilated left hepatic duct was targeted with ultrasound. The anterior abdomen was anesthetized with 1% lidocaine. [place] needle was directed into this mildly dilated intrahepatic bile duct with ultrasound guidance. A wire advanced easily into the biliary system under fluoroscopic guidance. A micropuncture dilator set was placed and contrast injection confirmed placement in the biliary system. At this point, the patient was very combative and required restraining in order to prevent the patient from coming off the table. The combativeness appear to be secondary to the sedation medicines rather than pain. 5 [place] Kumpe catheter and Glidewire were advanced across the distal biliary obstruction with minimal difficulty. Due to patient's combativeness and confusion, we had no opportunity  to perform a brush biopsy. 10 [place] biliary drain was successfully placed over a stiff Amplatz wire. Tip was placed in the duodenum. Small amount of bile was aspirated for culture. Drain was sutured to skin and secured with a Stat Lock. FINDINGS: Mild dilatation of the intrahepatic ducts. Common bile duct is moderately distended. There is an obstruction at the distal common bile duct. Catheter and wire were able to traverse the distal biliary obstruction. 10 French drain was successfully advanced into the duodenum. Unable to perform a brush biopsy due to patient's combative state during the procedure. IMPRESSION: Successful placement of an internal/external biliary drain. Patient will need to be scheduled at a later time for brush biopsy and potential internalization. Alternately, patient may need an EUS for tissue diagnosis. Electronically Signed   By: Markus Daft M.D.   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 She currently has a left-sided internal/external biliary drainage catheter which has pulled back and may no longer be across the biliary occlusion. She presents for cholangiogram with possible biliary stent placement and possible biliary tube exchange. EXAM: 1. Cholangiogram through existing access 2. Placement of a metallic self expanding covered biliary stent 3. Exchange for a new 10 [place] external biliary drainage catheter MEDICATIONS: Patient is an inpatient already receiving intravenous antibiotics with 3.375 g Zosyn every 8 hours. No additional antibiotic prophylaxis was administered. ANESTHESIA/SEDATION: Due to a previous paradoxical reaction to Versed, 100 mcg fentanyl was administered intravenously for pain control during the procedure. Conscious sedation was not provided. FLUOROSCOPY TIME:  Fluoroscopy Time: 7 minutes 18  seconds (  404.8 mGy). COMPLICATIONS: None immediate. PROCEDURE: Informed written consent was obtained from the patient after a thorough discussion of the procedural risks, benefits and alternatives. All questions were addressed. Maximal Sterile Barrier Technique was utilized including caps, mask, sterile gowns, sterile gloves, sterile drape, hand hygiene and skin antiseptic. A timeout was performed prior to the initiation of the procedure. A hand injection of contrast material was performed through the existing biliary drainage catheter. The drainage catheter has pulled back and the tip remains within the occluded segments of the common bile duct. The tube was cut and removed over a Bentson wire. A 8 5 French angled catheter was advanced over the wire and positioned at the common bile duct occlusion. A hydrophilic roadrunner wire was advanced across the occlusion on the 5 French catheter advanced across the occlusion into the duodenum. A gentle hand injection of contrast material confirmed the presence of the catheter within the duodenum. The hydrophilic wire was exchanged for a superstiff Amplatz wire. The 5 French catheter was removed. A 9 French sheath was advanced over the wire and positioned in the common bile duct. A WallFlex 10 x 60 covered self expanding metal stent was then advanced across the lesion and deployed from the duodenum into the common bile duct. The stent was then post dilated using a 6 x 40 Mustang balloon. A gentle hand injection of contrast material through the sheath confirms antegrade flow throughout the stent and into the duodenum. There is rapid passage of contrast through the stent. The wire was brought back into the intrahepatic bile ducts and coiled at the intrahepatic biliary confluence. A new 10.2 all-purpose drainage catheter was then advanced over the wire and formed within the common bile duct. A final hand injection of contrast material and fluoroscopic saved image confirms the  position of the stent and the external biliary drain. The drain was secured with an 0 Prolene suture and connected to bag drainage. IMPRESSION: 1. Successful placement of a 10 x 60 covered metallic self expanding WallFlex stent across the common bile duct occlusion. 2. The internal/external biliary drainage catheter was removed and replaced with an external biliary drainage catheter positioned at the biliary confluence above the stent. PLAN: 1. Maintain biliary drain to gravity bag drainage until bilirubin has normalized. 2. Once bilirubin has trended downward significantly, removed the bag and cap the tube. The tube should be capped prior to discharge. 3. Return Interventional Radiology in 2-4 weeks for clinical evaluation and cholangiogram prior to removal of the biliary drain. Signed, Criselda Peaches, MD [facility] Electronically Signed   By: Jacqulynn Cadet M.D.   On: 10/09/2016 17:40    Endoscopy ultrasound (10/09/2016)  Impression: - 3.8cm solid mass in pancreatic head/uncinate. Preliminary cytology review is positive for malignancy (adenocarcinoma). - There was significant anatomic distortion of the duodenum related to diverticulum, spasticity. Air artifact also precluded evaluation of vascular structures. I could not visualize the distal end of the bile duct drain. After reviewing her most recent CT I am not sure that the distal end of the drain is still within the duodenum and recommend evaluation by IR (drain check), can consider placing a metal stent at the same time.  Recommendation: - Return patient to hospital ward for ongoing care. - Advance diet as tolerated. - Continue present medications.   Subjective: Patient reports mild abdominal pain that improved this morning. No nausea or vomiting.  Discharge Exam: Vitals:   10/10/16 2228 10/11/16 0504  BP: Marland Kitchen)  154/86 (!) 163/64  Pulse: 81 84  Resp: 18 19  Temp: 98.1 F  (36.7 C) 98.3 F (36.8 C)   Vitals:   10/10/16 0824 10/10/16 1426 10/10/16 2228 10/11/16 0504  BP: (!) 138/57 (!) 169/63 (!) 154/86 (!) 163/64  Pulse: 80 78 81 84  Resp: '17 17 18 19  '$ Temp: 98.1 F (36.7 C) 98 F (36.7 C) 98.1 F (36.7 C) 98.3 F (36.8 C)  TempSrc: Oral Oral Oral Oral  SpO2: 94% 95% 98% 95%  Weight:    88.8 kg (195 lb 12.8 oz)  Height:        General exam: Appears calm and comfortable Respiratory system: Clear to auscultation. Respiratory effort normal. Cardiovascular system: S1 & S2 heard, RRR. No murmurs. Gastrointestinal system: Abdomen is nondistended, soft and nontender. Drain in place with bile in bag. Normal bowel sounds heard. Central nervous system: Alert and oriented. No focal neurological deficits. Extremities: No edema. No calf tenderness Skin: No cyanosis. No rashes Psychiatry: Judgement and insight appear normal. Mood & affect appropriate.   The results of significant diagnostics from this hospitalization (including imaging, microbiology, ancillary and laboratory) are listed below for reference.     Microbiology: Recent Results (from the past 240 hour(s))  Blood culture (routine x 2)     Status: None (Preliminary result)   Collection Time: 10/07/16  9:30 AM  Result Value Ref Range Status   Specimen Description BLOOD RIGHT ARM  Final   Special Requests   Final    BOTTLES DRAWN AEROBIC AND ANAEROBIC Blood Culture adequate volume   Culture   Final    NO GROWTH 4 DAYS Performed at [facility], [address] [place], [place] [address]    Report Status PENDING  Incomplete  Blood culture (routine x 2)     Status: None (Preliminary result)   Collection Time: 10/07/16  9:31 AM  Result Value Ref Range Status   Specimen Description BLOOD LEFT HAND  Final   Special Requests IN PEDIATRIC BOTTLE Blood Culture adequate volume  Final   Culture   Final    NO GROWTH 4 DAYS Performed at [facility], [place] [address], [place], [place]  [address]    Report Status PENDING  Incomplete  Aerobic/Anaerobic Culture (surgical/deep wound)     Status: None (Preliminary result)   Collection Time: 10/10/16 12:35 PM  Result Value Ref Range Status   Specimen Description BILE  Final   Special Requests NONE  Final   Gram Stain   Final    RARE WBC PRESENT, PREDOMINANTLY PMN MODERATE YEAST FEW GRAM NEGATIVE RODS    Culture   Final    CULTURE REINCUBATED FOR BETTER GROWTH Performed at [facility], [place] [address], [place], [place] [address]    Report Status PENDING  Incomplete     Labs: Basic Metabolic Panel:  Recent Labs Lab 10/07/16 0135 10/08/16 0703 10/10/16 0642  NA 129* 137 139  K 4.2 3.5 3.7  CL 94* 107 111  CO2 25 21* 22  GLUCOSE 412* 56* 98  BUN '16 12 11  '$ CREATININE 1.17* 1.12* 1.24*  CALCIUM 9.7 8.9 8.6*   Liver Function Tests:  Recent Labs Lab 10/07/16 0135 10/08/16 0703 10/10/16 0642 10/11/16 0601  AST 43* 39 29 29  ALT 32 '28 22 22  '$ ALKPHOS 333* 259* 225* 222*  BILITOT 4.3* 4.1* 3.5* 3.5*  PROT 7.7 6.0* 5.5* 5.8*  ALBUMIN 3.1* 2.3* 2.1* 2.1*    Recent  Labs Lab 10/07/16 0135  LIPASE 78*   CBC:  Recent Labs Lab 10/07/16 0135 10/08/16 0703 10/09/16 0609 10/10/16 0642  WBC 17.6* 10.2 6.7 8.2  HGB 12.0 9.7* 9.0* 8.8*  HCT 35.3* 29.2* 26.5* 26.7*  MCV 93.4 94.2 95.3 94.0  PLT 373 302 265 284   CBG:  Recent Labs Lab 10/10/16 0733 10/10/16 1141 10/10/16 1701 10/10/16 2219 10/11/16 0740  GLUCAP 93 188* 194* 184* 88   Urinalysis    Component Value Date/Time   COLORURINE AMBER (A) 10/07/2016 0133   APPEARANCEUR CLEAR 10/07/2016 0133   LABSPEC 1.024 10/07/2016 0133   PHURINE 5.0 10/07/2016 0133   GLUCOSEU >=500 (A) 10/07/2016 0133   HGBUR NEGATIVE 10/07/2016 0133   BILIRUBINUR NEGATIVE 10/07/2016 0133   BILIRUBINUR negative 04/06/2013 1723   KETONESUR NEGATIVE 10/07/2016 0133   PROTEINUR 100 (A) 10/07/2016 0133   UROBILINOGEN 1.0 12/06/2013 1244   NITRITE NEGATIVE  10/07/2016 0133   LEUKOCYTESUR NEGATIVE 10/07/2016 0133   Microbiology Recent Results (from the past 240 hour(s))  Blood culture (routine x 2)     Status: None (Preliminary result)   Collection Time: 10/07/16  9:30 AM  Result Value Ref Range Status   Specimen Description BLOOD RIGHT ARM  Final   Special Requests   Final    BOTTLES DRAWN AEROBIC AND ANAEROBIC Blood Culture adequate volume   Culture   Final    NO GROWTH 4 DAYS Performed at [facility], [place] [address], [place], [place] [address]    Report Status PENDING  Incomplete  Blood culture (routine x 2)     Status: None (Preliminary result)   Collection Time: 10/07/16  9:31 AM  Result Value Ref Range Status   Specimen Description BLOOD LEFT HAND  Final   Special Requests IN PEDIATRIC BOTTLE Blood Culture adequate volume  Final   Culture   Final    NO GROWTH 4 DAYS Performed at [facility], [place] [address], [place], [place] [address]    Report Status PENDING  Incomplete  Aerobic/Anaerobic Culture (surgical/deep wound)     Status: None (Preliminary result)   Collection Time: 10/10/16 12:35 PM  Result Value Ref Range Status   Specimen Description BILE  Final   Special Requests NONE  Final   Gram Stain   Final    RARE WBC PRESENT, PREDOMINANTLY PMN MODERATE YEAST FEW GRAM NEGATIVE RODS    Culture   Final    CULTURE REINCUBATED FOR BETTER GROWTH Performed at [facility], [place] [address] [place],  [address]    Report Status PENDING  Incomplete     Time coordinating discharge: Over 30 minutes  SIGNED:   Cordelia Poche, MD [facility] 10/11/2016, 11:06 AM Pager [phone]  If 7PM-7AM, please contact night-coverage [email or web address] Password [email or web address]

## 2016-10-11 NOTE — Progress Notes (Signed)
Patient discharged home.  Leaving with personal belongings and two prescriptions.  Pt and husband report understanding of discharge instructions.  Educated patient and husband on how to flush biliary drain.  Husband reports he understands and demonstrated.  Patient on room air, denies pain, no s/s of distress.  Patient accompanied by husband.  No complaints.

## 2016-10-11 NOTE — Progress Notes (Signed)
Referring Physician(s): Rande Brunt  Supervising Physician: Corrie Mckusick  Patient Status:  [facility] - In-pt  Chief Complaint: Pancreatic cancer, biliary obstruction   Subjective: Patient without acute changes; still has some epigastric soreness; awaiting discharge home   Allergies: Patient has no known allergies.  Medications: Prior to Admission medications   Medication Sig Start Date End Date Taking? Authorizing Provider  amLODipine (NORVASC) 5 MG tablet Take 1 tablet (5 mg total) by mouth daily. Patient taking differently: Take 5 mg by mouth at bedtime.  07/18/16  Yes Mary-Margaret Hassell Done, FNP  amoxicillin-clavulanate (AUGMENTIN) 500-125 MG tablet Take 1 tablet (500 mg total) by mouth 2 (two) times daily. 10/02/16  Yes Verlee Monte, MD  benazepril (LOTENSIN) 40 MG tablet Take 1 tablet (40 mg total) by mouth at bedtime. 10/02/16  Yes Verlee Monte, MD  carvedilol (COREG) 25 MG tablet TAKE  (1)  TABLET TWICE A DAY WITH MEALS (BREAKFAST AND SUPPER) Patient taking differently: Take 25 mg by mouth 2 (two) times daily with a meal.  07/18/16  Yes Mary-Margaret Hassell Done, FNP  colestipol (COLESTID) 5 g packet Take 5 g by mouth 2 (two) times daily. 09/26/16  Yes Gatha Mayer, MD  diclofenac (VOLTAREN) 75 MG EC tablet Take 75 mg by mouth 2 (two) times daily as needed for mild pain.   Yes Historical Provider, MD  DULoxetine (CYMBALTA) 60 MG capsule Take 1 capsule (60 mg total) by mouth 2 (two) times daily. Patient taking differently: Take 60 mg by mouth daily.  07/18/16  Yes Mary-Margaret Hassell Done, FNP  furosemide (LASIX) 20 MG tablet Take 1 tablet (20 mg total) by mouth daily. Patient taking differently: Take 20 mg by mouth daily as needed for edema.  07/18/16  Yes Mary-Margaret Hassell Done, FNP  gabapentin (NEURONTIN) 300 MG capsule TAKE (1) CAPSULE TWICE DAILY. Patient taking differently: Take 300 mg by mouth at bedtime.  07/18/16  Yes Mary-Margaret Hassell Done, FNP  glipiZIDE-metformin (METAGLIP) 5-500 MG  tablet Take 2 tablets by mouth 2 (two) times daily before a meal. 07/18/16  Yes Mary-Margaret Hassell Done, FNP  HYDROcodone-acetaminophen (NORCO) 7.5-325 MG tablet Take 1 tablet by mouth every 6 (six) hours as needed for moderate pain. 08/21/16  Yes Ashok Pall, MD  Insulin Glargine (TOUJEO SOLOSTAR) 300 UNIT/ML SOPN Inject 60 Units into the skin daily. Patient taking differently: Inject 40 Units into the skin at bedtime.  08/15/16  Yes Mary-Margaret Hassell Done, FNP  pantoprazole (PROTONIX) 40 MG tablet Take 1 tablet (40 mg total) by mouth daily before breakfast. 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 She currently has a left-sided internal/external biliary drainage catheter which has pulled back and may no longer be across the biliary occlusion. She presents for cholangiogram with possible biliary stent placement and possible biliary tube exchange. EXAM: 1. Cholangiogram through existing access 2. Placement of a metallic self expanding covered biliary stent 3. Exchange for a new 10 [place] external biliary drainage  catheter MEDICATIONS: Patient is an inpatient already receiving intravenous antibiotics with 3.375 g Zosyn every 8 hours. No additional antibiotic prophylaxis was administered. ANESTHESIA/SEDATION: Due to a previous paradoxical reaction to Versed, 100 mcg fentanyl was administered intravenously for pain control during the procedure. Conscious sedation was not provided. FLUOROSCOPY TIME:   Fluoroscopy Time: 7 minutes 18 seconds (404.8 mGy). COMPLICATIONS: None immediate. PROCEDURE: Informed written consent was obtained from the patient after a thorough discussion of the procedural risks, benefits and alternatives. All questions were addressed. Maximal Sterile Barrier Technique was utilized including caps, mask, sterile gowns, sterile gloves, sterile drape, hand hygiene and skin antiseptic. A timeout was performed prior to the initiation of the procedure. A hand injection of contrast material was performed through the existing biliary drainage catheter. The drainage catheter has pulled back and the tip remains within the occluded segments of the common bile duct. The tube was cut and removed over a Bentson wire. A 8 5 French angled catheter was advanced over the wire and positioned at the common bile duct occlusion. A hydrophilic roadrunner wire was advanced across the occlusion on the 5 French catheter advanced across the occlusion into the duodenum. A gentle hand injection of contrast material confirmed the presence of the catheter within the duodenum. The hydrophilic wire was exchanged for a superstiff Amplatz wire. The 5 French catheter was removed. A 9 French sheath was advanced over the wire and positioned in the common bile duct. A WallFlex 10 x 60 covered self expanding metal stent was then advanced across the lesion and deployed from the duodenum into the common bile duct. The stent was then post dilated using a 6 x 40 Mustang balloon. A gentle hand injection of contrast material through the sheath confirms antegrade flow throughout the stent and into the duodenum. There is rapid passage of contrast through the stent. The wire was brought back into the intrahepatic bile ducts and coiled at the intrahepatic biliary confluence. A new 10.2 all-purpose drainage catheter was then advanced over the wire and formed within the common bile duct. A final hand injection of contrast material and  fluoroscopic saved image confirms the position of the stent and the external biliary drain. The drain was secured with an 0 Prolene suture and connected to bag drainage. IMPRESSION: 1. Successful placement of a 10 x 60 covered metallic self expanding WallFlex stent across the common bile duct occlusion. 2. The internal/external biliary drainage catheter was removed and replaced with an external biliary drainage catheter positioned at the biliary confluence above the stent. PLAN: 1. Maintain biliary drain to gravity bag drainage until bilirubin has normalized. 2. Once bilirubin has trended downward significantly, removed the bag and cap the tube. The tube should be capped prior to discharge. 3. Return Interventional Radiology in 2-4 weeks for clinical evaluation and cholangiogram prior to removal of the biliary drain. Signed, Criselda Peaches, MD [facility] Electronically Signed   By: Jacqulynn Cadet M.D.   On: 10/09/2016 17:40    Labs:  CBC:  Recent Labs  10/07/16 0135 10/08/16 0703 10/09/16 0609 10/10/16 0642  WBC 17.6* 10.2 6.7 8.2  HGB 12.0 9.7* 9.0* 8.8*  HCT 35.3* 29.2* 26.5* 26.7*  PLT 373 302 265 284    COAGS:  Recent Labs  09/20/16 1314 09/29/16 1350 10/09/16 0609  INR 0.86 0.90 1.03  APTT 33  --   --     BMP:  Recent Labs  10/03/16 1256 10/07/16 0135 10/08/16 0703  10/10/16 0642  NA 138 129* 137 139  K 5.1 4.2 3.5 3.7  CL 101 94* 107 111  CO2 21 25 21* 22  GLUCOSE 399* 412* 56* 98  BUN 21 16 12 11   CALCIUM 9.5 9.7 8.9 8.6*  CREATININE 1.53* 1.17* 1.12* 1.24*  GFRNONAA 34* 45* 48* 42*  GFRAA 39* 53* 55* 49*    LIVER FUNCTION TESTS:  Recent Labs  10/07/16 0135 10/08/16 0703 10/10/16 0642 10/11/16 0601  BILITOT 4.3* 4.1* 3.5* 3.5*  AST 43* 39 29 29  ALT 32 28 22 22   ALKPHOS 333* 259* 225* 222*  PROT 7.7 6.0* 5.5* 5.8*  ALBUMIN 3.1* 2.3* 2.1* 2.1*    Assessment and Plan: Pt with hx  panc ca/biliary obstruction, s/p metallic biliary stent/exchange of existing I/E biliary drain 4/19; afebrile,t bili 3.5, bile cx pend; case d/w Dr. Earleen Newport- ok for pt to go home with drain attached to bag; rec drain flushes with 5-10 cc sterile NS (do not aspirate) once every 1-2 days, record output and change dressing every 1-2 days; she will be scheduled for f/u cholangiogram/labs in IR clinic in 7-10 days; stay well hydrated; call [phone] or [phone] with any questions.  Electronically Signed: D. Rowe Robert 10/11/2016, 9:15 AM   I spent a total of 15 minutes at the the patient's bedside AND on the patient's hospital floor or unit, greater than 50% of which was counseling/coordinating care for biliary drain    Patient ID: Patricia Austin, female   DOB: Sep 09, 1945, 72 y.o.   MRN: [id number]

## 2016-10-12 LAB — CULTURE, BLOOD (ROUTINE X 2)
CULTURE: NO GROWTH
Culture: NO GROWTH
Special Requests: ADEQUATE
Special Requests: ADEQUATE

## 2016-10-13 ENCOUNTER — Other Ambulatory Visit (INDEPENDENT_AMBULATORY_CARE_PROVIDER_SITE_OTHER): Payer: Medicare Other

## 2016-10-13 ENCOUNTER — Other Ambulatory Visit: Payer: Self-pay | Admitting: Internal Medicine

## 2016-10-13 ENCOUNTER — Telehealth: Payer: Self-pay

## 2016-10-13 DIAGNOSIS — K869 Disease of pancreas, unspecified: Secondary | ICD-10-CM

## 2016-10-13 DIAGNOSIS — K8689 Other specified diseases of pancreas: Secondary | ICD-10-CM

## 2016-10-13 DIAGNOSIS — C259 Malignant neoplasm of pancreas, unspecified: Secondary | ICD-10-CM

## 2016-10-13 LAB — CBC WITH DIFFERENTIAL/PLATELET
BASOS PCT: 0.7 % (ref 0.0–3.0)
Basophils Absolute: 0.1 10*3/uL (ref 0.0–0.1)
EOS PCT: 1.3 % (ref 0.0–5.0)
Eosinophils Absolute: 0.1 10*3/uL (ref 0.0–0.7)
HCT: 29.9 % — ABNORMAL LOW (ref 36.0–46.0)
Hemoglobin: 10.3 g/dL — ABNORMAL LOW (ref 12.0–15.0)
LYMPHS ABS: 1.1 10*3/uL (ref 0.7–4.0)
Lymphocytes Relative: 11.3 % — ABNORMAL LOW (ref 12.0–46.0)
MCHC: 34.5 g/dL (ref 30.0–36.0)
MCV: 94 fl (ref 78.0–100.0)
MONO ABS: 0.7 10*3/uL (ref 0.1–1.0)
Monocytes Relative: 7.3 % (ref 3.0–12.0)
NEUTROS PCT: 79.4 % — AB (ref 43.0–77.0)
Neutro Abs: 8.1 10*3/uL — ABNORMAL HIGH (ref 1.4–7.7)
Platelets: 350 10*3/uL (ref 150.0–400.0)
RBC: 3.19 Mil/uL — ABNORMAL LOW (ref 3.87–5.11)
RDW: 14 % (ref 11.5–15.5)
WBC: 10.1 10*3/uL (ref 4.0–10.5)

## 2016-10-13 LAB — HEPATIC FUNCTION PANEL
ALT: 22 U/L (ref 0–35)
AST: 33 U/L (ref 0–37)
Albumin: 3.1 g/dL — ABNORMAL LOW (ref 3.5–5.2)
Alkaline Phosphatase: 324 U/L — ABNORMAL HIGH (ref 39–117)
BILIRUBIN TOTAL: 3.8 mg/dL — AB (ref 0.2–1.2)
Bilirubin, Direct: 2.4 mg/dL — ABNORMAL HIGH (ref 0.0–0.3)
Total Protein: 6.4 g/dL (ref 6.0–8.3)

## 2016-10-13 NOTE — Telephone Encounter (Signed)
Lab have been added and pt was called and confirmed she will be in today for labs

## 2016-10-13 NOTE — Telephone Encounter (Signed)
-----   Message from [place], [place] sent at 10/11/2016  9:30 AM EDT ----- Regarding: CBC/LFT Can you please make sure pt has labs including CBC with dif and LFT's in to be drawn Monday in our office 10/13/16-pt will be going to lab between 7:30-4:30- I did order these while she was in hospital (not sure I did it correctly)-please double check. Thanks! JLL

## 2016-10-15 ENCOUNTER — Ambulatory Visit (HOSPITAL_COMMUNITY)
Admission: RE | Admit: 2016-10-15 | Discharge: 2016-10-15 | Disposition: A | Discharge status: Home / Self Care | Payer: Medicare Other | Source: Ambulatory Visit | Attending: Internal Medicine | Admitting: Internal Medicine

## 2016-10-15 ENCOUNTER — Other Ambulatory Visit: Payer: Self-pay | Admitting: Internal Medicine

## 2016-10-15 ENCOUNTER — Encounter (HOSPITAL_COMMUNITY): Payer: Self-pay | Admitting: Interventional Radiology

## 2016-10-15 DIAGNOSIS — K831 Obstruction of bile duct: Secondary | ICD-10-CM | POA: Diagnosis not present

## 2016-10-15 DIAGNOSIS — Z4682 Encounter for fitting and adjustment of non-vascular catheter: Secondary | ICD-10-CM | POA: Diagnosis not present

## 2016-10-15 DIAGNOSIS — C259 Malignant neoplasm of pancreas, unspecified: Secondary | ICD-10-CM | POA: Insufficient documentation

## 2016-10-15 HISTORY — PX: IR CHOLANGIOGRAM EXISTING TUBE: IMG6040

## 2016-10-15 LAB — AEROBIC/ANAEROBIC CULTURE W GRAM STAIN (SURGICAL/DEEP WOUND)

## 2016-10-15 LAB — AEROBIC/ANAEROBIC CULTURE (SURGICAL/DEEP WOUND)

## 2016-10-15 MED ORDER — IOPAMIDOL (ISOVUE-300) INJECTION 61%
INTRAVENOUS | Status: AC
  Administered 2016-10-15: 10 mL
  Filled 2016-10-15: qty 50

## 2016-10-15 MED ORDER — LIDOCAINE HCL 1 % IJ SOLN
INTRAMUSCULAR | Status: AC
  Filled 2016-10-15: qty 20

## 2016-10-15 MED ORDER — HYDROCODONE-ACETAMINOPHEN 5-325 MG PO TABS
ORAL_TABLET | ORAL | Status: AC
  Administered 2016-10-15: 2 via ORAL
  Filled 2016-10-15: qty 2

## 2016-10-15 MED ORDER — IOPAMIDOL (ISOVUE-300) INJECTION 61%
10.0000 mL | Freq: Once | INTRAVENOUS | Status: AC | PRN
  Administered 2016-10-15: 10 mL

## 2016-10-15 MED ORDER — HYDROCODONE-ACETAMINOPHEN 5-325 MG PO TABS
1.0000 | ORAL_TABLET | Freq: Four times a day (QID) | ORAL | Status: DC | PRN
  Administered 2016-10-15: 2 via ORAL

## 2016-10-16 ENCOUNTER — Other Ambulatory Visit: Payer: Self-pay

## 2016-10-16 DIAGNOSIS — R945 Abnormal results of liver function studies: Principal | ICD-10-CM

## 2016-10-16 DIAGNOSIS — R7989 Other specified abnormal findings of blood chemistry: Secondary | ICD-10-CM

## 2016-10-16 MED ORDER — HYDROCODONE-ACETAMINOPHEN 7.5-325 MG PO TABS: 1.0000 | ORAL_TABLET | Freq: Four times a day (QID) | ORAL | 0 refills | Status: DC | PRN

## 2016-10-20 ENCOUNTER — Other Ambulatory Visit: Payer: Self-pay | Admitting: Internal Medicine

## 2016-10-20 ENCOUNTER — Encounter (HOSPITAL_COMMUNITY): Payer: Self-pay | Admitting: Interventional Radiology

## 2016-10-20 ENCOUNTER — Ambulatory Visit (HOSPITAL_COMMUNITY)
Admission: RE | Admit: 2016-10-20 | Discharge: 2016-10-20 | Disposition: A | Discharge status: Home / Self Care | Payer: Medicare Other | Source: Ambulatory Visit | Attending: Internal Medicine | Admitting: Internal Medicine

## 2016-10-20 DIAGNOSIS — Z4682 Encounter for fitting and adjustment of non-vascular catheter: Secondary | ICD-10-CM | POA: Diagnosis not present

## 2016-10-20 DIAGNOSIS — K831 Obstruction of bile duct: Secondary | ICD-10-CM | POA: Diagnosis not present

## 2016-10-20 DIAGNOSIS — C259 Malignant neoplasm of pancreas, unspecified: Secondary | ICD-10-CM

## 2016-10-20 DIAGNOSIS — Z8507 Personal history of malignant neoplasm of pancreas: Secondary | ICD-10-CM | POA: Insufficient documentation

## 2016-10-20 HISTORY — PX: IR REMOVAL BILIARY DRAIN: IMG6047

## 2016-10-20 MED ORDER — IOPAMIDOL (ISOVUE-300) INJECTION 61%
INTRAVENOUS | Status: AC
  Administered 2016-10-20: 20 mL
  Filled 2016-10-20: qty 50

## 2016-10-20 MED ORDER — IOPAMIDOL (ISOVUE-300) INJECTION 61%
20.0000 mL | Freq: Once | INTRAVENOUS | Status: AC | PRN
  Administered 2016-10-20: 20 mL

## 2016-10-22 ENCOUNTER — Telehealth: Payer: Self-pay | Admitting: Oncology

## 2016-10-22 ENCOUNTER — Ambulatory Visit (INDEPENDENT_AMBULATORY_CARE_PROVIDER_SITE_OTHER): Payer: Medicare Other | Admitting: Family Medicine

## 2016-10-22 ENCOUNTER — Ambulatory Visit (HOSPITAL_BASED_OUTPATIENT_CLINIC_OR_DEPARTMENT_OTHER): Payer: Medicare Other | Admitting: Oncology

## 2016-10-22 ENCOUNTER — Telehealth: Payer: Self-pay | Admitting: *Deleted

## 2016-10-22 ENCOUNTER — Ambulatory Visit (HOSPITAL_BASED_OUTPATIENT_CLINIC_OR_DEPARTMENT_OTHER): Payer: Medicare Other

## 2016-10-22 VITALS — BP 158/82 | HR 104 | Temp 98.9°F | Resp 19 | Wt 179.7 lb

## 2016-10-22 VITALS — BP 178/78 | HR 106 | Temp 98.4°F | Ht 63.0 in | Wt 181.0 lb

## 2016-10-22 DIAGNOSIS — E1165 Type 2 diabetes mellitus with hyperglycemia: Secondary | ICD-10-CM | POA: Diagnosis not present

## 2016-10-22 DIAGNOSIS — L299 Pruritus, unspecified: Secondary | ICD-10-CM

## 2016-10-22 DIAGNOSIS — D649 Anemia, unspecified: Secondary | ICD-10-CM | POA: Diagnosis not present

## 2016-10-22 DIAGNOSIS — Z794 Long term (current) use of insulin: Secondary | ICD-10-CM | POA: Diagnosis not present

## 2016-10-22 DIAGNOSIS — C258 Malignant neoplasm of overlapping sites of pancreas: Secondary | ICD-10-CM | POA: Diagnosis not present

## 2016-10-22 DIAGNOSIS — C250 Malignant neoplasm of head of pancreas: Secondary | ICD-10-CM

## 2016-10-22 DIAGNOSIS — R109 Unspecified abdominal pain: Secondary | ICD-10-CM | POA: Diagnosis not present

## 2016-10-22 DIAGNOSIS — M549 Dorsalgia, unspecified: Secondary | ICD-10-CM | POA: Diagnosis not present

## 2016-10-22 DIAGNOSIS — E1065 Type 1 diabetes mellitus with hyperglycemia: Secondary | ICD-10-CM | POA: Diagnosis not present

## 2016-10-22 LAB — COMPREHENSIVE METABOLIC PANEL
ALBUMIN: 2.4 g/dL — AB (ref 3.5–5.0)
ALK PHOS: 615 U/L — AB (ref 40–150)
ALT: 33 U/L (ref 0–55)
AST: 46 U/L — ABNORMAL HIGH (ref 5–34)
Anion Gap: 9 mEq/L (ref 3–11)
BILIRUBIN TOTAL: 2.11 mg/dL — AB (ref 0.20–1.20)
BUN: 8.6 mg/dL (ref 7.0–26.0)
CALCIUM: 10 mg/dL (ref 8.4–10.4)
CO2: 29 mEq/L (ref 22–29)
Chloride: 94 mEq/L — ABNORMAL LOW (ref 98–109)
Creatinine: 1.3 mg/dL — ABNORMAL HIGH (ref 0.6–1.1)
EGFR: 40 mL/min/{1.73_m2} — ABNORMAL LOW (ref >90)
GLUCOSE: 542 mg/dL — AB (ref 70–140)
POTASSIUM: 4.5 meq/L (ref 3.5–5.1)
SODIUM: 133 meq/L — AB (ref 136–145)
TOTAL PROTEIN: 7 g/dL (ref 6.4–8.3)

## 2016-10-22 LAB — GLUCOSE HEMOCUE WAIVED

## 2016-10-22 MED ORDER — HYDROXYZINE HCL 25 MG PO TABS: 25.0000 mg | ORAL_TABLET | Freq: Four times a day (QID) | ORAL | 0 refills | Status: DC | PRN

## 2016-10-22 NOTE — Progress Notes (Signed)
Subjective:  Patient ID: Patricia Austin, female    DOB: [birth year]  Age: 71 y.o. MRN: [id number]  CC: Hyperglycemia (pt here today after being called by Elvina Sidle with a BS of  542 and told to go to see her PCP. She is dizzy and lightheaded. )   HPI Patricia Austin presents for severely elevated blood sugar. Seen in ED. Glucose noted after release.  Pt. Denies sx. She has been cutting back on her insulin at home. Today did not take any. Continues to eat regular diet. Not following low carb program. Patient has long-term history of diabetes. Denies any vision changes currently. Reports lightheaded type dizziness. She is having some frequency and oliguria over the last several days. Getting somewhat worse.   History Patricia Austin has a past medical history of Anxiety; Chronic lower back pain; CKD (chronic kidney disease) stage 3, GFR 30-59 ml/min; Complication of anesthesia (2002); Depression; Difficult intubation (2002); Dysrhythmia; Esophageal dysmotility; Fibromyalgia; Hypertension; Nephritis (~ 1955); Osteoarthritis; Pancreatic mass; Renal insufficiency; Seasonal allergies; Shortness of breath; squamous cell carcinoma scalp; and Type II diabetes mellitus ([place]).   She has a past surgical history that includes Lumbar disc surgery (~ 2012 X 2); Appendectomy; Squamous cell carcinoma excision (2002); Total knee arthroplasty (Left, 12/14/2013); Total knee arthroplasty (Left, 12/15/2013); Carpal tunnel release (Right); Cyst excision (1990's?); Skin graft (2002); Colonoscopy; Carpal tunnel release (Left); Esophagogastroduodenoscopy (N/A, 09/26/2016); IR INT EXT BILIARY DRAIN WITH CHOLANGIOGRAM (09/29/2016); Back surgery (07/2016); Tonsillectomy (age 23); Abdominal hysterectomy; IR BILIARY STENT(S) EXISTING ACCESS INC DILATION CATH EXCHANGE (10/09/2016); EUS (N/A, 10/09/2016); IR CHOLANGIOGRAM EXISTING TUBE (10/15/2016); and IR REMOVAL BILIARY DRAIN (10/20/2016).   Her family history includes Early death in her mother; Thrombosis  in her father.She reports that she has never smoked. She has never used smokeless tobacco. She reports that she does not drink alcohol or use drugs.    ROS Review of Systems  Constitutional: Negative for activity change, appetite change and fever.  HENT: Negative for congestion, rhinorrhea and sore throat.   Eyes: Negative for visual disturbance.  Respiratory: Negative for cough and shortness of breath.   Cardiovascular: Negative for chest pain and palpitations.  Gastrointestinal: Negative for abdominal pain, diarrhea and nausea.  Genitourinary: Negative for dysuria.  Musculoskeletal: Negative for arthralgias and myalgias.    Objective:  BP (!) 178/78   Pulse (!) 106   Temp 98.4 F (36.9 C) (Oral)   Ht 5\' 3"  (1.6 m)   Wt 181 lb (82.1 kg)   BMI 32.06 kg/m   BP Readings from Last 3 Encounters:  10/27/16 (!) 153/79  10/22/16 (!) 178/78  10/22/16 (!) 158/82    Wt Readings from Last 3 Encounters:  10/27/16 181 lb (82.1 kg)  10/22/16 181 lb (82.1 kg)  10/22/16 179 lb 11.2 oz (81.5 kg)     Physical Exam  Constitutional: She is oriented to person, place, and time. She appears well-developed and well-nourished. No distress.  HENT:  Head: Normocephalic and atraumatic.  Eyes: Conjunctivae are normal. Pupils are equal, round, and reactive to light.  Neck: Normal range of motion. Neck supple. No thyromegaly present.  Cardiovascular: Normal rate, regular rhythm and normal heart sounds.   No murmur heard. Pulmonary/Chest: Effort normal and breath sounds normal. No respiratory distress. She has no wheezes. She has no rales.  Abdominal: Soft. Bowel sounds are normal. She exhibits no distension. There is no tenderness.  Musculoskeletal: Normal range of motion.  Lymphadenopathy:    She has no cervical adenopathy.  Neurological: She is alert and oriented to person, place, and time.  Skin: Skin is warm and dry.  Psychiatric: She has a normal mood and affect. Her behavior is normal.  Judgment and thought content normal.      Assessment & Plan:   Patricia Austin was seen today for hyperglycemia.  Diagnoses and all orders for this visit:  Type 2 diabetes mellitus with hyperglycemia, with long-term current use of insulin (HCC) -     Glucose Hemocue Waived     FSBS= High  I have discontinued Patricia Austin's colestipol. I am also having her maintain her amLODipine, glipiZIDE-metformin, gabapentin, DULoxetine, carvedilol, Insulin Glargine, pantoprazole, benazepril, HYDROcodone-acetaminophen, and hydrOXYzine.  Allergies as of 10/22/2016   No Known Allergies     Medication List       Accurate as of 10/22/16 11:59 PM. Always use your most recent med list.          amLODipine 5 MG tablet Commonly known as:  NORVASC Take 1 tablet (5 mg total) by mouth daily.   benazepril 40 MG tablet Commonly known as:  LOTENSIN Take 1 tablet (40 mg total) by mouth at bedtime.   carvedilol 25 MG tablet Commonly known as:  COREG TAKE  (1)  TABLET TWICE A DAY WITH MEALS (BREAKFAST AND SUPPER)   DULoxetine 60 MG capsule Commonly known as:  CYMBALTA Take 1 capsule (60 mg total) by mouth 2 (two) times daily.   gabapentin 300 MG capsule Commonly known as:  NEURONTIN TAKE (1) CAPSULE TWICE DAILY.   glipiZIDE-metformin 5-500 MG tablet Commonly known as:  METAGLIP Take 2 tablets by mouth 2 (two) times daily before a meal.   HYDROcodone-acetaminophen 7.5-325 MG tablet Commonly known as:  NORCO Take 1-2 tablets by mouth every 6 (six) hours as needed for moderate pain.   hydrOXYzine 25 MG tablet Commonly known as:  ATARAX/VISTARIL Take 1 tablet (25 mg total) by mouth every 6 (six) hours as needed.   Insulin Glargine 300 UNIT/ML Sopn Commonly known as:  TOUJEO SOLOSTAR Inject 60 Units into the skin daily.   pantoprazole 40 MG tablet Commonly known as:  PROTONIX Take 1 tablet (40 mg total) by mouth daily before breakfast.      Given 20 units of NovoLog. Resume to show at 60 units  daily do not discontinue. Discouraged decreasing dose between office consultations.  Follow-up: Return in about 1 week (around 10/29/2016).  Claretta Fraise, M.D.

## 2016-10-22 NOTE — Telephone Encounter (Signed)
Gave patient avs report and appointments for May. Central radiology will call re scan. °

## 2016-10-22 NOTE — Telephone Encounter (Signed)
Per order of Dr. Benay Spice, patient's husband notified that bilirubin is still mildly elevated, but improved and also that patient's glucose level was 542 on her CMET today from [facility].  Patient's husband states that they have a glucometer at home and that he will check patient's glucose now and call pt.'s primary provider Shelah Lewandowsky to inform her of today's glucose at [facility] and most recent glucose check.  Informed patient's husband that Dr. Benay Spice has ordered Hydroxyzine to be sent to patient's pharmacy for itching.  Patient's husband appreciative of call and has no questions at this time.

## 2016-10-22 NOTE — Progress Notes (Signed)
[facility] OFFICE PROGRESS NOTE   Diagnosis: Pancreas cancer  INTERVAL HISTORY:   I saw her while hospitalized 2 weeks ago with cholangitis. She underwent placement of an external biliary drain/bile duct stent. The external drain was removed on 10/20/2016. A cholangiogram revealed decompressed intrahepatic bile ducts and a patent metallic biliary stent. She continues to have pruritus, but this is improved as compared to before the biliary drain was placed. She has upper abdominal pain, relieved with hydrocodone. Objective:  Vital signs in last 24 hours:  Blood pressure (!) 158/82, pulse (!) 104, temperature 98.9 F (37.2 C), temperature source Oral, resp. rate 19, weight 179 lb 11.2 oz (81.5 kg), SpO2 97 %.    HEENT: Mild scleral icterus Resp: Lungs clear bilaterally Cardio: Regular rate and rhythm GI: The liver edge is palpable in the right upper abdomen, tender in the mid upper abdomen, no apparent ascites Vascular: No leg edema  Skin: Mild jaundice   Portacath/PICC-without erythema  Lab Results:  Lab Results  Component Value Date   WBC 10.1 10/13/2016   HGB 10.3 (L) 10/13/2016   HCT 29.9 (L) 10/13/2016   MCV 94.0 10/13/2016   PLT 350.0 10/13/2016   NEUTROABS 8.1 (H) 10/13/2016    CMP     Component Value Date/Time   NA 133 (L) 10/22/2016 1343   K 4.5 10/22/2016 1343   CL 111 10/10/2016 0642   CO2 29 10/22/2016 1343   GLUCOSE 542 (H) 10/22/2016 1343   BUN 8.6 10/22/2016 1343   CREATININE 1.3 (H) 10/22/2016 1343   CALCIUM 10.0 10/22/2016 1343   PROT 7.0 10/22/2016 1343   ALBUMIN 2.4 (L) 10/22/2016 1343   AST 46 (H) 10/22/2016 1343   ALT 33 10/22/2016 1343   ALKPHOS 615 (H) 10/22/2016 1343   BILITOT 2.11 (H) 10/22/2016 1343   GFRNONAA 42 (L) 10/10/2016 0642   GFRAA 49 (L) 10/10/2016 0642     Lab Results  Component Value Date   INR 1.03 10/09/2016    Imaging:  Ir Removal Biliary Drain  Result Date: 10/20/2016 INDICATION: History of  pancreatic carcinoma and common bile duct obstruction. Status post placement of covered metallic common bile duct stent on 10/09/2016 and additional cholangiogram with placement of drain in left lobe intrahepatic duct on 10/15/2016. EXAM: CHOLANGIOGRAM THROUGH PRE-EXISTING DRAINAGE CATHETER AND REMOVAL OF BILIARY DRAIN MEDICATIONS: None ANESTHESIA/SEDATION: None CONTRAST:  20 mL Isovue-300 FLUOROSCOPY TIME:  Fluoroscopy Time: 42 seconds.  (28 mGy). COMPLICATIONS: None immediate. PROCEDURE: Informed written consent was obtained from the patient after a thorough discussion of the procedural risks, benefits and alternatives. All questions were addressed. Maximal Sterile Barrier Technique was utilized including caps, mask, sterile gowns, sterile gloves, sterile drape, hand hygiene and skin antiseptic. A timeout was performed prior to the initiation of the procedure. A cholangiogram was performed through a pre-existing capped left hepatic biliary drain. Multiple fluoroscopic spot images and loops were saved. Following the cholangiogram, the biliary drainage catheter was cut and removed in its entirety. FINDINGS: The cholangiogram demonstrates decompressed intrahepatic bile ducts and a normally patent metallic biliary stent with excellent flow of contrast into the duodenum. The left hepatic biliary drain was removed without complication. IMPRESSION: Widely patent metallic common bile duct stent and decompressed intrahepatic bile ducts. The remaining capped left intrahepatic biliary drainage catheter was removed. Electronically Signed   By: Aletta Edouard M.D.   On: 10/20/2016 17:07    Medications: I have reviewed the patient's current medications.  Assessment/Plan: 1. Adenocarcinoma the pancreas-pancreas  head/uncinate mass noted on a CT abdomen/pelvis 09/20/2016, status post an EUS biopsy 10/09/2016 confirming adenocarcinoma  CT 09/20/2016-pancreas head/uncinate mass, possible contact with the superior mesenteric  artery, biliary dilatation, small low lung nodules  2. Biliary obstruction secondary to #1, status post placement of a wall stent and external biliary drain 10/09/2016  Biliary drain removed  10/20/2016  3.  Diabetes  4.  History of squamous cell carcinoma of the scalp  5.  Lumbar fusion surgery February 2018  6.  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  Abdominal/back pain secondary to pancreas cancer  8.  Cholangitis 10/07/2016    Disposition:  Ms Tolson has been diagnosed with pancreas cancer. Lung nodules noted on the abdomen CT are suspicious for metastatic disease. Her case was presented at the GI tumor conference on 10/15/2016. The plan is to obtain a staging chest CT. If there is evidence of metastatic disease I will recommend gemcitabine/Abraxane chemotherapy. The bilirubin remains mildly elevated. Hopefully this will improve over the next few weeks. The pruritus should also improved. She will try hydroxyzine as needed for pruritus.  She will return for an office visit and further discussion on 10/29/2016.  25 minutes were spent with the patient today. The majority of the time was used for counseling and coordination of care.   Betsy Coder, MD  10/22/2016  2:27 PM

## 2016-10-22 NOTE — Telephone Encounter (Signed)
Opened in error

## 2016-10-24 ENCOUNTER — Telehealth: Payer: Self-pay | Admitting: Nurse Practitioner

## 2016-10-24 NOTE — Telephone Encounter (Signed)
Husband states that patient was seen Wed at Oncology and her BS was 542. Patient came in and see Dr. Livia Snellen that day . Was told to take her Toujeo that night and patient refused to take it. Husband states that patient started not talking right so he called EMS. When they tried to take her BS it regisered "high". They talked patient into taking her Toujeo. Took BS this morning and it was 389. Wanting to make sure she is just supposed to take her normal insulin dose tonight. Husband states that pt found out that she has cancer and he is not sure if she is giving up but states that patient now has aggred to take her medication like she should. Please advise.

## 2016-10-24 NOTE — Telephone Encounter (Signed)
Robert aware and verbalizes understanding per dpr.

## 2016-10-24 NOTE — Telephone Encounter (Signed)
She needs to do her toujeo EVERY night

## 2016-10-27 ENCOUNTER — Emergency Department (HOSPITAL_COMMUNITY): Payer: Medicare Other

## 2016-10-27 ENCOUNTER — Other Ambulatory Visit: Payer: Self-pay

## 2016-10-27 ENCOUNTER — Encounter (HOSPITAL_COMMUNITY): Payer: Self-pay | Admitting: Emergency Medicine

## 2016-10-27 ENCOUNTER — Inpatient Hospital Stay (HOSPITAL_COMMUNITY)
Admission: EM | Admit: 2016-10-27 | Discharge: 2016-10-31 | DRG: 637 | Disposition: A | Discharge status: Home Health | Payer: Medicare Other | Attending: Internal Medicine | Admitting: Internal Medicine

## 2016-10-27 ENCOUNTER — Telehealth: Payer: Self-pay | Admitting: *Deleted

## 2016-10-27 DIAGNOSIS — G9349 Other encephalopathy: Secondary | ICD-10-CM | POA: Diagnosis present

## 2016-10-27 DIAGNOSIS — C258 Malignant neoplasm of overlapping sites of pancreas: Secondary | ICD-10-CM | POA: Diagnosis not present

## 2016-10-27 DIAGNOSIS — I129 Hypertensive chronic kidney disease with stage 1 through stage 4 chronic kidney disease, or unspecified chronic kidney disease: Secondary | ICD-10-CM | POA: Diagnosis not present

## 2016-10-27 DIAGNOSIS — D638 Anemia in other chronic diseases classified elsewhere: Secondary | ICD-10-CM | POA: Diagnosis present

## 2016-10-27 DIAGNOSIS — R911 Solitary pulmonary nodule: Secondary | ICD-10-CM | POA: Diagnosis not present

## 2016-10-27 DIAGNOSIS — E785 Hyperlipidemia, unspecified: Secondary | ICD-10-CM | POA: Diagnosis not present

## 2016-10-27 DIAGNOSIS — K219 Gastro-esophageal reflux disease without esophagitis: Secondary | ICD-10-CM | POA: Diagnosis present

## 2016-10-27 DIAGNOSIS — Z96652 Presence of left artificial knee joint: Secondary | ICD-10-CM | POA: Diagnosis present

## 2016-10-27 DIAGNOSIS — C259 Malignant neoplasm of pancreas, unspecified: Secondary | ICD-10-CM | POA: Diagnosis present

## 2016-10-27 DIAGNOSIS — M797 Fibromyalgia: Secondary | ICD-10-CM | POA: Diagnosis present

## 2016-10-27 DIAGNOSIS — F329 Major depressive disorder, single episode, unspecified: Secondary | ICD-10-CM | POA: Diagnosis present

## 2016-10-27 DIAGNOSIS — M545 Low back pain: Secondary | ICD-10-CM | POA: Diagnosis present

## 2016-10-27 DIAGNOSIS — G8929 Other chronic pain: Secondary | ICD-10-CM | POA: Diagnosis not present

## 2016-10-27 DIAGNOSIS — Z794 Long term (current) use of insulin: Secondary | ICD-10-CM | POA: Diagnosis not present

## 2016-10-27 DIAGNOSIS — R4182 Altered mental status, unspecified: Secondary | ICD-10-CM | POA: Diagnosis not present

## 2016-10-27 DIAGNOSIS — R739 Hyperglycemia, unspecified: Secondary | ICD-10-CM | POA: Diagnosis not present

## 2016-10-27 DIAGNOSIS — K830 Cholangitis: Secondary | ICD-10-CM | POA: Diagnosis present

## 2016-10-27 DIAGNOSIS — E1122 Type 2 diabetes mellitus with diabetic chronic kidney disease: Secondary | ICD-10-CM | POA: Diagnosis not present

## 2016-10-27 DIAGNOSIS — E11649 Type 2 diabetes mellitus with hypoglycemia without coma: Secondary | ICD-10-CM | POA: Diagnosis present

## 2016-10-27 DIAGNOSIS — N179 Acute kidney failure, unspecified: Secondary | ICD-10-CM | POA: Diagnosis present

## 2016-10-27 DIAGNOSIS — K224 Dyskinesia of esophagus: Secondary | ICD-10-CM | POA: Diagnosis not present

## 2016-10-27 DIAGNOSIS — Z90710 Acquired absence of both cervix and uterus: Secondary | ICD-10-CM

## 2016-10-27 DIAGNOSIS — E860 Dehydration: Secondary | ICD-10-CM | POA: Diagnosis present

## 2016-10-27 DIAGNOSIS — E871 Hypo-osmolality and hyponatremia: Secondary | ICD-10-CM | POA: Diagnosis present

## 2016-10-27 DIAGNOSIS — E875 Hyperkalemia: Secondary | ICD-10-CM | POA: Diagnosis not present

## 2016-10-27 DIAGNOSIS — G9340 Encephalopathy, unspecified: Secondary | ICD-10-CM

## 2016-10-27 DIAGNOSIS — I10 Essential (primary) hypertension: Secondary | ICD-10-CM | POA: Diagnosis not present

## 2016-10-27 DIAGNOSIS — K8020 Calculus of gallbladder without cholecystitis without obstruction: Secondary | ICD-10-CM | POA: Diagnosis not present

## 2016-10-27 DIAGNOSIS — Z9114 Patient's other noncompliance with medication regimen: Secondary | ICD-10-CM | POA: Diagnosis not present

## 2016-10-27 DIAGNOSIS — E1169 Type 2 diabetes mellitus with other specified complication: Secondary | ICD-10-CM | POA: Diagnosis not present

## 2016-10-27 DIAGNOSIS — R410 Disorientation, unspecified: Secondary | ICD-10-CM | POA: Diagnosis not present

## 2016-10-27 DIAGNOSIS — N183 Chronic kidney disease, stage 3 unspecified: Secondary | ICD-10-CM | POA: Diagnosis present

## 2016-10-27 DIAGNOSIS — E1142 Type 2 diabetes mellitus with diabetic polyneuropathy: Secondary | ICD-10-CM

## 2016-10-27 DIAGNOSIS — I517 Cardiomegaly: Secondary | ICD-10-CM | POA: Diagnosis not present

## 2016-10-27 DIAGNOSIS — IMO0002 Reserved for concepts with insufficient information to code with codable children: Secondary | ICD-10-CM

## 2016-10-27 DIAGNOSIS — C250 Malignant neoplasm of head of pancreas: Secondary | ICD-10-CM | POA: Diagnosis present

## 2016-10-27 DIAGNOSIS — E119 Type 2 diabetes mellitus without complications: Secondary | ICD-10-CM

## 2016-10-27 DIAGNOSIS — E1165 Type 2 diabetes mellitus with hyperglycemia: Secondary | ICD-10-CM | POA: Diagnosis not present

## 2016-10-27 DIAGNOSIS — C7931 Secondary malignant neoplasm of brain: Secondary | ICD-10-CM

## 2016-10-27 DIAGNOSIS — Z85828 Personal history of other malignant neoplasm of skin: Secondary | ICD-10-CM

## 2016-10-27 DIAGNOSIS — F419 Anxiety disorder, unspecified: Secondary | ICD-10-CM | POA: Diagnosis present

## 2016-10-27 DIAGNOSIS — Z79899 Other long term (current) drug therapy: Secondary | ICD-10-CM

## 2016-10-27 HISTORY — DX: Disorder of kidney and ureter, unspecified: N28.9

## 2016-10-27 HISTORY — DX: Malignant neoplasm of pancreas, unspecified: C25.9

## 2016-10-27 LAB — URINALYSIS, ROUTINE W REFLEX MICROSCOPIC
BACTERIA UA: NONE SEEN
BILIRUBIN URINE: NEGATIVE
Glucose, UA: >=500 mg/dL — AB
HGB URINE DIPSTICK: NEGATIVE
KETONES UR: NEGATIVE mg/dL
LEUKOCYTES UA: NEGATIVE
Nitrite: NEGATIVE
PROTEIN: 100 mg/dL — AB
SPECIFIC GRAVITY, URINE: 1.02 (ref 1.005–1.030)
pH: 7 (ref 5.0–8.0)

## 2016-10-27 LAB — COMPREHENSIVE METABOLIC PANEL
ALT: 33 U/L (ref 14–54)
AST: 51 U/L — ABNORMAL HIGH (ref 15–41)
Albumin: 2.8 g/dL — ABNORMAL LOW (ref 3.5–5.0)
Alkaline Phosphatase: 590 U/L — ABNORMAL HIGH (ref 38–126)
Anion gap: 8 (ref 5–15)
BUN: 16 mg/dL (ref 6–20)
CHLORIDE: 93 mmol/L — AB (ref 101–111)
CO2: 27 mmol/L (ref 22–32)
Calcium: 9.4 mg/dL (ref 8.9–10.3)
Creatinine, Ser: 1.33 mg/dL — ABNORMAL HIGH (ref 0.44–1.00)
GFR, EST AFRICAN AMERICAN: 45 mL/min — AB (ref >60)
GFR, EST NON AFRICAN AMERICAN: 39 mL/min — AB (ref >60)
Glucose, Bld: 588 mg/dL (ref 65–99)
POTASSIUM: 5.3 mmol/L — AB (ref 3.5–5.1)
SODIUM: 128 mmol/L — AB (ref 135–145)
Total Bilirubin: 1.7 mg/dL — ABNORMAL HIGH (ref 0.3–1.2)
Total Protein: 7.2 g/dL (ref 6.5–8.1)

## 2016-10-27 LAB — CBG MONITORING, ED
GLUCOSE-CAPILLARY: 339 mg/dL — AB (ref 65–99)
Glucose-Capillary: 515 mg/dL (ref 65–99)
Glucose-Capillary: 456 mg/dL — ABNORMAL HIGH (ref 65–99)
Glucose-Capillary: 192 mg/dL — ABNORMAL HIGH (ref 65–99)

## 2016-10-27 LAB — AMMONIA: AMMONIA: 29 umol/L (ref 9–35)

## 2016-10-27 LAB — GLUCOSE, CAPILLARY
GLUCOSE-CAPILLARY: 175 mg/dL — AB (ref 65–99)
Glucose-Capillary: 153 mg/dL — ABNORMAL HIGH (ref 65–99)
Glucose-Capillary: 173 mg/dL — ABNORMAL HIGH (ref 65–99)
Glucose-Capillary: 171 mg/dL — ABNORMAL HIGH (ref 65–99)
Glucose-Capillary: 155 mg/dL — ABNORMAL HIGH (ref 65–99)

## 2016-10-27 LAB — I-STAT CG4 LACTIC ACID, ED: LACTIC ACID, VENOUS: 1.21 mmol/L (ref 0.5–1.9)

## 2016-10-27 LAB — CBC
HEMATOCRIT: 29.2 % — AB (ref 36.0–46.0)
HEMOGLOBIN: 9.2 g/dL — AB (ref 12.0–15.0)
MCH: 31 pg (ref 26.0–34.0)
MCHC: 31.5 g/dL (ref 30.0–36.0)
MCV: 98.3 fL (ref 78.0–100.0)
Platelets: 533 10*3/uL — ABNORMAL HIGH (ref 150–400)
RBC: 2.97 MIL/uL — AB (ref 3.87–5.11)
RDW: 14 % (ref 11.5–15.5)
WBC: 7.8 10*3/uL (ref 4.0–10.5)

## 2016-10-27 LAB — PROTIME-INR
INR: 0.92
PROTHROMBIN TIME: 12.3 s (ref 11.4–15.2)

## 2016-10-27 MED ORDER — INSULIN GLARGINE 100 UNIT/ML ~~LOC~~ SOLN
45.0000 [IU] | Freq: Every day | SUBCUTANEOUS | Status: DC
  Administered 2016-10-27 – 2016-10-29 (×3): 45 [IU] via SUBCUTANEOUS
  Filled 2016-10-27 (×3): qty 0.45

## 2016-10-27 MED ORDER — INSULIN REGULAR BOLUS VIA INFUSION
0.0000 [IU] | Freq: Three times a day (TID) | INTRAVENOUS | Status: DC
  Filled 2016-10-27: qty 10

## 2016-10-27 MED ORDER — PANTOPRAZOLE SODIUM 40 MG PO TBEC
40.0000 mg | DELAYED_RELEASE_TABLET | Freq: Every day | ORAL | Status: DC
  Administered 2016-10-28 – 2016-10-31 (×4): 40 mg via ORAL
  Filled 2016-10-27 (×4): qty 1

## 2016-10-27 MED ORDER — INSULIN REGULAR HUMAN 100 UNIT/ML IJ SOLN
INTRAMUSCULAR | Status: DC
  Administered 2016-10-27: 4 [IU]/h via INTRAVENOUS
  Filled 2016-10-27: qty 2.5

## 2016-10-27 MED ORDER — DEXTROSE-NACL 5-0.45 % IV SOLN
INTRAVENOUS | Status: DC
  Administered 2016-10-27: 17:00:00 via INTRAVENOUS

## 2016-10-27 MED ORDER — SODIUM CHLORIDE 0.9 % IV SOLN: INTRAVENOUS | Status: DC

## 2016-10-27 MED ORDER — AMLODIPINE BESYLATE 5 MG PO TABS
5.0000 mg | ORAL_TABLET | Freq: Every day | ORAL | Status: DC
  Administered 2016-10-27 – 2016-10-28 (×2): 5 mg via ORAL
  Filled 2016-10-27 (×3): qty 1

## 2016-10-27 MED ORDER — IOPAMIDOL (ISOVUE-300) INJECTION 61%
INTRAVENOUS | Status: AC
  Administered 2016-10-27: 75 mL
  Filled 2016-10-27: qty 75

## 2016-10-27 MED ORDER — CARVEDILOL 25 MG PO TABS
25.0000 mg | ORAL_TABLET | Freq: Two times a day (BID) | ORAL | Status: DC
  Administered 2016-10-27 – 2016-10-31 (×8): 25 mg via ORAL
  Filled 2016-10-27 (×8): qty 1

## 2016-10-27 MED ORDER — DULOXETINE HCL 30 MG PO CPEP
60.0000 mg | ORAL_CAPSULE | Freq: Every day | ORAL | Status: DC
  Administered 2016-10-27 – 2016-10-28 (×2): 60 mg via ORAL
  Filled 2016-10-27 (×2): qty 2

## 2016-10-27 MED ORDER — HYDROMORPHONE HCL 1 MG/ML IJ SOLN
1.0000 mg | Freq: Once | INTRAMUSCULAR | Status: AC
  Administered 2016-10-27: 1 mg via INTRAVENOUS
  Filled 2016-10-27: qty 1

## 2016-10-27 MED ORDER — DEXTROSE 50 % IV SOLN: 25.0000 mL | INTRAVENOUS | Status: DC | PRN

## 2016-10-27 MED ORDER — DEXTROSE-NACL 5-0.45 % IV SOLN: INTRAVENOUS | Status: DC

## 2016-10-27 MED ORDER — BENAZEPRIL HCL 20 MG PO TABS
40.0000 mg | ORAL_TABLET | Freq: Every day | ORAL | Status: DC
  Administered 2016-10-27 – 2016-10-28 (×2): 40 mg via ORAL
  Filled 2016-10-27 (×2): qty 2

## 2016-10-27 MED ORDER — LORAZEPAM 2 MG/ML IJ SOLN
0.5000 mg | INTRAMUSCULAR | Status: DC | PRN
  Administered 2016-10-27: 0.5 mg via INTRAVENOUS
  Filled 2016-10-27: qty 1

## 2016-10-27 MED ORDER — HYDROXYZINE HCL 25 MG PO TABS: 25.0000 mg | ORAL_TABLET | Freq: Four times a day (QID) | ORAL | Status: DC | PRN

## 2016-10-27 MED ORDER — SODIUM CHLORIDE 0.9 % IV SOLN
INTRAVENOUS | Status: DC
  Filled 2016-10-27: qty 2.5

## 2016-10-27 MED ORDER — ENOXAPARIN SODIUM 40 MG/0.4ML ~~LOC~~ SOLN
40.0000 mg | SUBCUTANEOUS | Status: DC
  Administered 2016-10-27 – 2016-10-30 (×4): 40 mg via SUBCUTANEOUS
  Filled 2016-10-27 (×4): qty 0.4

## 2016-10-27 MED ORDER — HYDROCODONE-ACETAMINOPHEN 5-325 MG PO TABS: 1.0000 | ORAL_TABLET | Freq: Four times a day (QID) | ORAL | Status: DC | PRN

## 2016-10-27 MED ORDER — INSULIN ASPART 100 UNIT/ML ~~LOC~~ SOLN
0.0000 [IU] | Freq: Three times a day (TID) | SUBCUTANEOUS | Status: DC
  Administered 2016-10-28 (×2): 8 [IU] via SUBCUTANEOUS
  Administered 2016-10-28 – 2016-10-29 (×3): 2 [IU] via SUBCUTANEOUS
  Administered 2016-10-29: 5 [IU] via SUBCUTANEOUS
  Administered 2016-10-29: 3 [IU] via SUBCUTANEOUS
  Administered 2016-10-30: 2 [IU] via SUBCUTANEOUS
  Administered 2016-10-30: 8 [IU] via SUBCUTANEOUS

## 2016-10-27 MED ORDER — MORPHINE SULFATE (PF) 4 MG/ML IV SOLN
4.0000 mg | Freq: Once | INTRAVENOUS | Status: AC
  Administered 2016-10-27: 4 mg via INTRAVENOUS
  Filled 2016-10-27: qty 1

## 2016-10-27 NOTE — H&P (Addendum)
History and Physical    [name] SWN:[id number] DOB: 26-May-1946 DOA: 10/27/2016  I have briefly reviewed the patient's prior medical records in [place]  PCP: Patricia Pretty, [place]  Patient coming from: Home  Chief Complaint: Brought by her husband due to elevated blood sugars, intermittent confusion  HPI: Patricia [place] is a 71 y.o. female with medical history significant of pancreatic adenocarcinoma diagnosed 2 months ago, with obstructive cholangitis status post drain placement which was later removed and now has an internal stent, diabetes, chronic kidney disease stage III, hypertension, is being brought to the emergency room by patient's husband due to intermittent confusion and elevated blood sugars.  In the ED, patient is clearly confused in the room, and cannot reliably contribute to the story.  She is quite upset that she is here, she is quite upset that she is going come to the hospital.  She is still thinks that she has a biliary drain in place, but remembers that that was removed when her husband redirect her.  She was found to be significantly hyperglycemic, and patient tells me that she is compliant with her insulin regimen.  When asked how many units she is taking, she tells me 30 despite appearing that she takes 50 units in epic.  She can tell me that she has no chest pain, no shortness of breath, she has had no fever or chills, she has abdominal pain which sometimes goes into her back, not associated with nausea or vomiting.  She has been complaining of increased thirst on and off.  ED Course: In the emergency room vital signs are stable, she is afebrile, blood work reveals a sodium of 128, potassium of 5.3, initial glucose was 588.  No anion gap was seen.  Her alkaline phosphatase is 590, bilirubin is 1.7.  CBC is somewhat unremarkable.  TRH was asked for admission for hyperglycemia and intermittent encephalopathy  Review of Systems: Unable to obtain accurate review of  systems due to encephalopathy  Past Medical History:  Diagnosis Date  . Anxiety    "not all the time"   . Chronic lower back pain   . CKD (chronic kidney disease) stage 3, GFR 30-59 ml/min   . Complication of anesthesia 2002   difficulty /w intubation, with plastic surgery , because she was feeling so anxious. 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 .   Pt. reports that she  is very sensitive to med. & is hard  to wake up.  . Depression    "not all the time"   . Difficult intubation 2002   with head surgery 2002 has [place] ok with later intubations  . Dysrhythmia    told that she sometimes has extra beats. Does not see cardiologist, had a stress test many yrs. ago>10 yrs.   . Esophageal dysmotility    "getting worse recenly" (12/15/2013)  . Fibromyalgia   . Hypertension   . Nephritis ~ 1955  . Osteoarthritis    knees   . Pancreatic mass   . Renal insufficiency   . Seasonal allergies    "in the spring"  . Shortness of breath    walking long distances   . squamous cell carcinoma scalp    squamous, on scalp, & has had several surgeries on scalp, for plastics repair of scalp   . Type II diabetes mellitus ([place])     Past Surgical History:  Procedure Laterality Date  . ABDOMINAL HYSTERECTOMY     complete  .  APPENDECTOMY    . BACK SURGERY  07/2016   L 4 and L 5 [facility] [place] [facility]   . CARPAL TUNNEL RELEASE Right   . CARPAL TUNNEL RELEASE Left   . COLONOSCOPY    . CYST EXCISION  1990's?   scalp  . ESOPHAGOGASTRODUODENOSCOPY N/A 09/26/2016   Procedure: ESOPHAGOGASTRODUODENOSCOPY (EGD);  Surgeon: Patricia Mayer, [place];  Location: [facility] ENDOSCOPY;  Service: Endoscopy;  Laterality: N/A;  . EUS N/A 10/09/2016   Procedure: UPPER ENDOSCOPIC ULTRASOUND (EUS) LINEAR;  Surgeon: Patricia Banister, [place];  Location: [facility] ENDOSCOPY;  Service: Endoscopy;  Laterality: N/A;  . IR BILIARY STENT(S) EXISTING ACCESS INC DILATION CATH EXCHANGE  10/09/2016  . IR CHOLANGIOGRAM EXISTING TUBE  10/15/2016  . IR  INT EXT BILIARY DRAIN WITH CHOLANGIOGRAM  09/29/2016   2 gallstones  . IR REMOVAL BILIARY DRAIN  10/20/2016  . LUMBAR DISC SURGERY  ~ 2012 X 2  . SKIN GRAFT  2002   scalp; "went to [facility] to repair OR earlier in the yearr"  . SQUAMOUS CELL CARCINOMA EXCISION  2002   "off scalp; cancerous area removed and stapled; Patricia [place]"  . TONSILLECTOMY  age 21  . TOTAL KNEE ARTHROPLASTY Left 12/14/2013   Procedure: LEFT TOTAL KNEE ARTHROPLASTY;  Surgeon: Patricia [place];  Location: [facility];  Service: Orthopedics;  Laterality: Left;  . TOTAL KNEE ARTHROPLASTY Left 12/15/2013     reports that she has never smoked. She has never used smokeless tobacco. She reports that she does not drink alcohol or use drugs.  No Known Allergies  Family History  Problem Relation Age of Onset  . Early death Mother   . Thrombosis Father     Prior to Admission medications   Medication Sig Start Date End Date Taking? Authorizing Provider  amLODipine (NORVASC) 5 MG tablet Take 1 tablet (5 mg total) by mouth daily. Patient taking differently: Take 5 mg by mouth at bedtime.  07/18/16  Yes Patricia [place]  benazepril (LOTENSIN) 40 MG tablet Take 1 tablet (40 mg total) by mouth at bedtime. 10/02/16  Yes Patricia [place]  carvedilol (COREG) 25 MG tablet TAKE  (1)  TABLET TWICE A DAY WITH MEALS (BREAKFAST AND SUPPER) Patient taking differently: Take 25 mg by mouth 2 (two) times daily with a meal.  07/18/16  Yes Patricia [place]  DULoxetine (CYMBALTA) 60 MG capsule Take 1 capsule (60 mg total) by mouth 2 (two) times daily. Patient taking differently: Take 60 mg by mouth daily.  07/18/16  Yes Patricia [place]  gabapentin (NEURONTIN) 300 MG capsule TAKE (1) CAPSULE TWICE DAILY. Patient taking differently: Take 300 mg by mouth at bedtime.  07/18/16  Yes Patricia [place]  glipiZIDE-metformin (METAGLIP) 5-500 MG tablet Take 2 tablets by mouth 2 (two) times daily before a meal. 07/18/16  Yes  Patricia [place]  HYDROcodone-acetaminophen (NORCO) 7.5-325 MG tablet Take 1-2 tablets by mouth every 6 (six) hours as needed for moderate pain. 10/16/16  Yes Pyrtle, Lajuan Lines, [place]  hydrOXYzine (ATARAX/VISTARIL) 25 MG tablet Take 1 tablet (25 mg total) by mouth every 6 (six) hours as needed. 10/22/16  Yes Ladell Pier, [place]  Insulin Glargine (TOUJEO SOLOSTAR) 300 UNIT/ML SOPN Inject 60 Units into the skin daily. Patient taking differently: Inject 50 Units into the skin at bedtime.  08/15/16  Yes Patricia [place]  pantoprazole (PROTONIX) 40 MG tablet Take 1 tablet (40 mg total) by mouth daily before breakfast. 09/26/16  Yes Silvano Rusk  E, [place]    Physical Exam: Vitals:   10/27/16 1108 10/27/16 1109 10/27/16 1248 10/27/16 1339  BP: (!) 163/75   (!) 205/90  Pulse: (!) 117   (!) 107  Resp: 18   18  Temp: 98 F (36.7 C)  97.9 F (36.6 C)   TempSrc: Oral  Oral   SpO2: 97%   98%  Weight:  82.1 kg (181 lb)    Height:  5\' 3"  (1.6 m)     Constitutional: NAD, calm, comfortable Eyes: PERRL, lids and conjunctivae slightly icteric ENMT: Mucous membranes are moist.  Neck: normal, supple Respiratory: clear to auscultation bilaterally, no wheezing, no crackles. Normal respiratory effort. No accessory muscle use.  Cardiovascular: Regular rate and rhythm, no murmurs / rubs / gallops. No extremity edema.  Abdomen: no tenderness, no masses palpated.  Musculoskeletal: no clubbing / cyanosis.  Decreased muscle tone.  Skin: no rashes, lesions, ulcers. No induration Neurologic: Nonfocal Psychiatric: flat affect, confused  Labs on Admission: I have personally reviewed following labs and imaging studies  CBC:  Recent Labs Lab 10/27/16 1120  WBC 7.8  HGB 9.2*  HCT 29.2*  MCV 98.3  PLT 433*   Basic Metabolic Panel:  Recent Labs Lab 10/22/16 1343 10/27/16 1120  NA 133* 128*  K 4.5 5.3*  CL  --  93*  CO2 29 27  GLUCOSE 542* 588*  BUN 8.6 16  CREATININE 1.3* 1.33*  CALCIUM  10.0 9.4   GFR: Estimated Creatinine Clearance: 38.8 mL/min (A) (by C-G formula based on SCr of 1.33 mg/dL (H)). Liver Function Tests:  Recent Labs Lab 10/22/16 1343 10/27/16 1120  AST 46* 51*  ALT 33 33  ALKPHOS 615* 590*  BILITOT 2.11* 1.7*  PROT 7.0 7.2  ALBUMIN 2.4* 2.8*   No results for input(s): LIPASE, AMYLASE in the last 168 hours.  Recent Labs Lab 10/27/16 1236  AMMONIA 29   Coagulation Profile:  Recent Labs Lab 10/27/16 1125  INR 0.92   Cardiac Enzymes: No results for input(s): CKTOTAL, CKMB, CKMBINDEX, TROPONINI in the last 168 hours. BNP (last 3 results) No results for input(s): PROBNP in the last 8760 hours. HbA1C: No results for input(s): HGBA1C in the last 72 hours. CBG:  Recent Labs Lab 10/27/16 1118 10/27/16 1343  GLUCAP 515* 456*   Lipid Profile: No results for input(s): CHOL, HDL, LDLCALC, TRIG, CHOLHDL, LDLDIRECT in the last 72 hours. Thyroid Function Tests: No results for input(s): TSH, T4TOTAL, FREET4, T3FREE, THYROIDAB in the last 72 hours. Anemia Panel: No results for input(s): VITAMINB12, FOLATE, FERRITIN, TIBC, IRON, RETICCTPCT in the last 72 hours. Urine analysis:    Component Value Date/Time   COLORURINE YELLOW 10/27/2016 [place] 10/27/2016 1246   LABSPEC 1.020 10/27/2016 1246   PHURINE 7.0 10/27/2016 1246   GLUCOSEU >=500 (A) 10/27/2016 1246   HGBUR NEGATIVE 10/27/2016 1246   BILIRUBINUR NEGATIVE 10/27/2016 1246   BILIRUBINUR negative 04/06/2013 1723   KETONESUR NEGATIVE 10/27/2016 1246   PROTEINUR 100 (A) 10/27/2016 1246   UROBILINOGEN 1.0 12/06/2013 1244   NITRITE NEGATIVE 10/27/2016 1246   LEUKOCYTESUR NEGATIVE 10/27/2016 1246     Radiological Exams on Admission: Ct Head W & Wo Contrast  Result Date: 10/27/2016 CLINICAL DATA:  71 year old female with increasing confusion. Pancreatic cancer. EXAM: CT HEAD WITHOUT AND WITH CONTRAST TECHNIQUE: Contiguous axial images were obtained from the base of  the skull through the vertex without and with intravenous contrast CONTRAST:  26mL ISOVUE-300 IOPAMIDOL (ISOVUE-300) INJECTION 61% COMPARISON:  09/29/2016 head CT without contrast and earlier. FINDINGS: Brain: Stable cerebral volume. No midline shift, ventriculomegaly, mass effect, evidence of mass lesion, intracranial hemorrhage or evidence of cortically based acute infarction. Gray-white matter differentiation is within normal limits throughout the brain. No abnormal enhancement identified. No encephalomalacia identified. Vascular: Calcified atherosclerosis at the skull base. Major intracranial vascular structures appear to be enhancing. Skull: Negative.  No acute osseous abnormality identified. Sinuses/Orbits: Visualized paranasal sinuses and mastoids are stable and well pneumatized. Other: Negative orbit and scalp soft tissues. IMPRESSION: Normal for age CT appearance of the brain. No metastatic disease or acute intracranial abnormality. Electronically Signed   By: Genevie Ann M.D.   On: 10/27/2016 13:44   Ct Chest W Contrast  Result Date: 10/27/2016 CLINICAL DATA:  Pancreatic cancer. Worsening confusion and abdominal pain. EXAM: CT CHEST, ABDOMEN, AND PELVIS WITH CONTRAST TECHNIQUE: Multidetector CT imaging of the chest, abdomen and pelvis was performed following the standard protocol during bolus administration of intravenous contrast. CONTRAST:  30mL ISOVUE-300 IOPAMIDOL (ISOVUE-300) INJECTION 61% COMPARISON:  Multiple exams, including 10/07/2016 CT scan FINDINGS: CT CHEST FINDINGS Cardiovascular: Mild cardiomegaly. Interventricular septal thickness 1.7 cm compatible with mild left ventricular hypertrophy. Mediastinum/Nodes: Scattered small axillary and mediastinal lymph nodes are not pathologically enlarged. Calcified left infrahilar lymph node compatible with old granulomatous disease. Wall thickening in the distal esophagus. Lungs/Pleura: Subtle nodularity along the right minor fissure likely attributable  to subpleural lymph nodes. Mild reticulonodular opacities in both lungs, especially in dependent portions. A 5 mm right lower lobe nodule is present on image 101/6, no change from 09/20/16. A calcified granuloma in the left lower lobe measures 7 mm in diameter on image 99/6. Musculoskeletal: There is a notable central disc protrusion at T12-L1. CT ABDOMEN PELVIS FINDINGS Hepatobiliary: Pneumobilia. Punctate calcifications in the hepatic parenchyma from old granulomatous disease. Mild intrahepatic biliary dilatation in segment 6. 2.9 cm laminated gallstone in the gallbladder. Expandable stent in the common bile duct traversing the pancreatic head neck extending to the duodenum. Pancreas: Indistinctly marginated/infiltrative mass involving the pancreatic head and uncinate process, 4.9 by 4.1 cm on image 67/3, no fat plane separating the mass from the superior mesenteric artery posteriorly, some tributaries of the SMA run adjacent to the mass as well. The dorsal pancreatic duct is not overtly dilated at this time. Poor definition of fat planes around the common bile duct. Spleen: Old granulomatous disease Adrenals/Urinary Tract: Several hypodense renal lesions are present, including a 9 mm left kidney lesion on image 20/4 and a 8 mm lesion on image 19/4 in the right kidney, among others, this lesions are technically nonspecific although statistically likely to be benign lesions such as cysts. Adrenal glands unremarkable. Stomach/Bowel: The transverse duodenum is inseparable from the pancreatic mass. Sigmoid colon diverticulosis. Vascular/Lymphatic: No current portal vein or splenic vein thrombosis. Aortoiliac atherosclerotic vascular disease. Reproductive: Uterus absent.  Adnexa unremarkable. Other: No supplemental non-categorized findings. Musculoskeletal: Small umbilical hernia contains adipose tissue. Posterolateral rod and pedicle screw fixation and L3-L4-L5 IMPRESSION: 1. Enlarging pancreatic head mass, abutting  the posterior margin of the superior mesenteric artery and tributaries of the superior mesenteric vein. No specific findings of distant metastatic disease are currently identified. Prior percutaneous stent/drain has been replaced with an expandable stent which traverses the mass, there is some borderline intrahepatic biliary dilatation in the right hepatic lobe but this is minimal. 2. Large gallstone. 3. Old granulomatous disease. 4. Mild cardiomegaly and left ventricular hypertrophy. 5. Wall thickening in the distal esophagus may reflect esophagitis.  6. Stable 5 mm right lower lobe nodule with some stable faint reticulonodular opacities in both lungs. Electronically Signed   By: Van Clines M.D.   On: 10/27/2016 14:00   Ct Abdomen Pelvis W Contrast  Result Date: 10/27/2016 CLINICAL DATA:  Pancreatic cancer. Worsening confusion and abdominal pain. EXAM: CT CHEST, ABDOMEN, AND PELVIS WITH CONTRAST TECHNIQUE: Multidetector CT imaging of the chest, abdomen and pelvis was performed following the standard protocol during bolus administration of intravenous contrast. CONTRAST:  10mL ISOVUE-300 IOPAMIDOL (ISOVUE-300) INJECTION 61% COMPARISON:  Multiple exams, including 10/07/2016 CT scan FINDINGS: CT CHEST FINDINGS Cardiovascular: Mild cardiomegaly. Interventricular septal thickness 1.7 cm compatible with mild left ventricular hypertrophy. Mediastinum/Nodes: Scattered small axillary and mediastinal lymph nodes are not pathologically enlarged. Calcified left infrahilar lymph node compatible with old granulomatous disease. Wall thickening in the distal esophagus. Lungs/Pleura: Subtle nodularity along the right minor fissure likely attributable to subpleural lymph nodes. Mild reticulonodular opacities in both lungs, especially in dependent portions. A 5 mm right lower lobe nodule is present on image 101/6, no change from 09/20/16. A calcified granuloma in the left lower lobe measures 7 mm in diameter on image 99/6.  Musculoskeletal: There is a notable central disc protrusion at T12-L1. CT ABDOMEN PELVIS FINDINGS Hepatobiliary: Pneumobilia. Punctate calcifications in the hepatic parenchyma from old granulomatous disease. Mild intrahepatic biliary dilatation in segment 6. 2.9 cm laminated gallstone in the gallbladder. Expandable stent in the common bile duct traversing the pancreatic head neck extending to the duodenum. Pancreas: Indistinctly marginated/infiltrative mass involving the pancreatic head and uncinate process, 4.9 by 4.1 cm on image 67/3, no fat plane separating the mass from the superior mesenteric artery posteriorly, some tributaries of the SMA run adjacent to the mass as well. The dorsal pancreatic duct is not overtly dilated at this time. Poor definition of fat planes around the common bile duct. Spleen: Old granulomatous disease Adrenals/Urinary Tract: Several hypodense renal lesions are present, including a 9 mm left kidney lesion on image 20/4 and a 8 mm lesion on image 19/4 in the right kidney, among others, this lesions are technically nonspecific although statistically likely to be benign lesions such as cysts. Adrenal glands unremarkable. Stomach/Bowel: The transverse duodenum is inseparable from the pancreatic mass. Sigmoid colon diverticulosis. Vascular/Lymphatic: No current portal vein or splenic vein thrombosis. Aortoiliac atherosclerotic vascular disease. Reproductive: Uterus absent.  Adnexa unremarkable. Other: No supplemental non-categorized findings. Musculoskeletal: Small umbilical hernia contains adipose tissue. Posterolateral rod and pedicle screw fixation and L3-L4-L5 IMPRESSION: 1. Enlarging pancreatic head mass, abutting the posterior margin of the superior mesenteric artery and tributaries of the superior mesenteric vein. No specific findings of distant metastatic disease are currently identified. Prior percutaneous stent/drain has been replaced with an expandable stent which traverses the  mass, there is some borderline intrahepatic biliary dilatation in the right hepatic lobe but this is minimal. 2. Large gallstone. 3. Old granulomatous disease. 4. Mild cardiomegaly and left ventricular hypertrophy. 5. Wall thickening in the distal esophagus may reflect esophagitis. 6. Stable 5 mm right lower lobe nodule with some stable faint reticulonodular opacities in both lungs. Electronically Signed   By: Van Clines M.D.   On: 10/27/2016 14:00    EKG: Independently reviewed.  Sinus tachycardia  Assessment/Plan Active Problems:   Diabetes (HCC)   Hyperlipidemia with target LDL less than 100   CKD (chronic kidney disease) stage 3, GFR 30-59 ml/min   Pancreatic cancer ([place])   Hyperglycemia   Diabetes mellitus with hyperglycemia -Patient is confused in the  ED, I have difficulties believing that she is taking her insulin as she says she are, likely she is missing her doses. 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 -I have admitted this patient few weeks ago, and even at that time the husband does mention that she has been having intermittent confusion for quite some time, suspect a degree of chronic component -Likely mental status slightly worse due to persistent hyperglycemia -Hold gabapentin  Pseudohyponatremia -Due to hypoglycemia  Pancreatic cancer -With biliary obstruction status post percutaneous placement now with a stent in the common bile duct, CT scan of the abdomen and pelvis [place] on admission without acute findings, she has elevation of her bilirubin and alkaline phosphatase however improved compared to prior values -Outpatient management  Hypertension -Continue home carvedilol and amlodipine, continue benazepril  Chronic kidney disease stage III -Creatinine is at baseline  GERD -Continue Protonix   DVT prophylaxis: Lovenox Code Status: Full code Family Communication:  Discussed with husband at bedside Disposition Plan: Admit to [place], presume home when ready Consults called: None    Admission status: Observation  At the point of initial evaluation, it is my clinical opinion that admission for OBSERVATION is reasonable and necessary because the patient's presenting complaints in the context of their chronic conditions represent sufficient risk of deterioration or significant morbidity to constitute reasonable grounds for close observation in the hospital setting, but that the patient may be medically stable for discharge from the hospital within 24 to 48 hours.   Marzetta Board, [place] [facility] Pager [phone]  If 7PM-7AM, please contact night-coverage [email or web address] Password [email or web address]  10/27/2016, 3:02 PM

## 2016-10-27 NOTE — ED Notes (Signed)
Unable to obtain sugar and start insulin drip at this time.  Pt in CT.

## 2016-10-27 NOTE — ED Triage Notes (Signed)
Per husband pt here for evaluation of worsening confusion and abdominal pain related to pancreatic cancer. Pt has not begun treatment. Pt denies n/v/d.

## 2016-10-27 NOTE — ED Notes (Signed)
Report given to [facility] RN floor RN

## 2016-10-27 NOTE — Telephone Encounter (Signed)
Returned call to pt's husband, he reports he is taking her to the ED. Stated she is "delirious" and is not taking her medications or insulin as she should. Husband thinks she needs to be admitted.  Will make Dr. Benay Spice aware of call.

## 2016-10-27 NOTE — ED Provider Notes (Signed)
[facility] DEPT Provider Note   CSN: [id number] Arrival date & time: 10/27/16  1055     History   Chief Complaint Chief Complaint  Patient presents with  . Cancer  . Abdominal Pain  . Altered Mental Status    HPI MACAILA TAHIR is a 71 y.o. female with pancreatic cancer, recently diagnosed and has not yet started treatment. Her husband brings her to the emergency department for increased confusion. He states that this morning the patient was putting on her shoes and instead was placing a teddy bear on her foot repeatedly trying to fasten it. He states that she's had some intermittent confusion. However, this seemed to be the worst episode. The patient also had severe epigastric pain and was doubled over this morning. She was scheduled to get a chest CT with contrast, which had not yet been done, and he requests that we do that today. The patient has not had any fevers and has had a good appetite at home. Her husband states she has not been taking her pain medication and he thinks it's because of her confusion. She has intermittent lucidity. He also brought her today because her blood sugar at home was close to 600 and when he called her oncologist. He recommended he bring her to the ER. HPI  Past Medical History:  Diagnosis Date  . Anxiety    "not all the time"   . Chronic lower back pain   . CKD (chronic kidney disease) stage 3, GFR 30-59 ml/min   . Complication of anesthesia 2002   difficulty /w intubation, with plastic surgery , because she was feeling so anxious. 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 .   Pt. reports that she  is very sensitive to med. & is hard  to wake up.  . Depression    "not all the time"   . Difficult intubation 2002   with head surgery 2002 has done ok with later intubations  . Dysrhythmia    told that she sometimes has extra beats. Does not see cardiologist, had a stress test many yrs. ago>10 yrs.   . Esophageal dysmotility    "getting worse  recenly" (12/15/2013)  . Fibromyalgia   . Hypertension   . Nephritis ~ 1955  . Osteoarthritis    knees   . Pancreatic mass   . Renal insufficiency   . Seasonal allergies    "in the spring"  . Shortness of breath    walking long distances   . squamous cell carcinoma scalp    squamous, on scalp, & has had several surgeries on scalp, for plastics repair of scalp   . Type II diabetes mellitus [facility])     Patient Active Problem List   Diagnosis Date Noted  . Hyperglycemia 10/27/2016  . Pancreatic cancer ([place])   . Hyponatremia 10/07/2016  . Abdominal pain   . Biliary obstruction 09/29/2016  . Multiple duodenal ulcers   . Lumbar adjacent segment disease with spondylolisthesis 08/20/2016  . Peripheral edema 07/18/2016  . CKD (chronic kidney disease) stage 3, GFR 30-59 ml/min 09/21/2015  . Fibromyalgia 09/20/2015  . DJD (degenerative joint disease) of knee 12/14/2013  . Diabetes ([place]) 02/09/2013  . Morbid obesity ([place]) 02/09/2013  . Hypertension 02/09/2013  . Hyperlipidemia with target LDL less than 100 02/09/2013  . Osteoarthritis of both feet 02/09/2013  . Osteoarthritis of both knees 02/09/2013  . GERD (gastroesophageal reflux disease) 02/09/2013  . Allergic rhinitis 02/09/2013  . Squamous cell carcinoma of scalp  02/09/2013    Past Surgical History:  Procedure Laterality Date  . ABDOMINAL HYSTERECTOMY     complete  . APPENDECTOMY    . BACK SURGERY  07/2016   L 4 and L 5 fusion done [facility]   . CARPAL TUNNEL RELEASE Right   . CARPAL TUNNEL RELEASE Left   . COLONOSCOPY    . CYST EXCISION  1990's?   scalp  . ESOPHAGOGASTRODUODENOSCOPY N/A 09/26/2016   Procedure: ESOPHAGOGASTRODUODENOSCOPY (EGD);  Surgeon: Gatha Mayer, MD;  Location: [facility] ENDOSCOPY;  Service: Endoscopy;  Laterality: N/A;  . EUS N/A 10/09/2016   Procedure: UPPER ENDOSCOPIC ULTRASOUND (EUS) LINEAR;  Surgeon: Milus Banister, MD;  Location: [facility] ENDOSCOPY;  Service: Endoscopy;  Laterality: N/A;  . IR BILIARY  STENT(S) EXISTING ACCESS INC DILATION CATH EXCHANGE  10/09/2016  . IR CHOLANGIOGRAM EXISTING TUBE  10/15/2016  . IR INT EXT BILIARY DRAIN WITH CHOLANGIOGRAM  09/29/2016   2 gallstones  . IR REMOVAL BILIARY DRAIN  10/20/2016  . LUMBAR DISC SURGERY  ~ 2012 X 2  . SKIN GRAFT  2002   scalp; "went to [facility] to repair OR earlier in the yearr"  . SQUAMOUS CELL CARCINOMA EXCISION  2002   "off scalp; cancerous area removed and stapled; Dr. Georgia Lopes"  . TONSILLECTOMY  age 3  . TOTAL KNEE ARTHROPLASTY Left 12/14/2013   Procedure: LEFT TOTAL KNEE ARTHROPLASTY;  Surgeon: Ninetta Lights, MD;  Location: [facility];  Service: Orthopedics;  Laterality: Left;  . TOTAL KNEE ARTHROPLASTY Left 12/15/2013    OB History    No data available       Home Medications    Prior to Admission medications   Medication Sig Start Date End Date Taking? Authorizing Provider  amLODipine (NORVASC) 5 MG tablet Take 1 tablet (5 mg total) by mouth daily. Patient taking differently: Take 5 mg by mouth at bedtime.  07/18/16  Yes Martin, Mary-Margaret, FNP  benazepril (LOTENSIN) 40 MG tablet Take 1 tablet (40 mg total) by mouth at bedtime. 10/02/16  Yes Verlee Monte, MD  carvedilol (COREG) 25 MG tablet TAKE  (1)  TABLET TWICE A DAY WITH MEALS (BREAKFAST AND SUPPER) Patient taking differently: Take 25 mg by mouth 2 (two) times daily with a meal.  07/18/16  Yes Hassell Done, Mary-Margaret, FNP  DULoxetine (CYMBALTA) 60 MG capsule Take 1 capsule (60 mg total) by mouth 2 (two) times daily. Patient taking differently: Take 60 mg by mouth daily.  07/18/16  Yes Martin, Mary-Margaret, FNP  gabapentin (NEURONTIN) 300 MG capsule TAKE (1) CAPSULE TWICE DAILY. Patient taking differently: Take 300 mg by mouth at bedtime.  07/18/16  Yes Hassell Done, Mary-Margaret, FNP  glipiZIDE-metformin (METAGLIP) 5-500 MG tablet Take 2 tablets by mouth 2 (two) times daily before a meal. 07/18/16  Yes Hassell Done, Mary-Margaret, FNP  HYDROcodone-acetaminophen (NORCO) 7.5-325 MG tablet  Take 1-2 tablets by mouth every 6 (six) hours as needed for moderate pain. 10/16/16  Yes Pyrtle, Lajuan Lines, MD  hydrOXYzine (ATARAX/VISTARIL) 25 MG tablet Take 1 tablet (25 mg total) by mouth every 6 (six) hours as needed. 10/22/16  Yes Ladell Pier, MD  Insulin Glargine (TOUJEO SOLOSTAR) 300 UNIT/ML SOPN Inject 60 Units into the skin daily. Patient taking differently: Inject 50 Units into the skin at bedtime.  08/15/16  Yes Hassell Done, Mary-Margaret, FNP  pantoprazole (PROTONIX) 40 MG tablet Take 1 tablet (40 mg total) by mouth daily before breakfast. 09/26/16  Yes Gatha Mayer, MD    Family History Family History  Problem Relation Age of Onset  . Early death Mother   . Thrombosis Father     Social History Social History  Substance Use Topics  . Smoking status: Never Smoker  . Smokeless tobacco: Never Used  . Alcohol use No     Allergies   Patient has no known allergies.   Review of Systems Review of Systems Ten systems reviewed and are negative for acute change, except as noted in the HPI.    Physical Exam Updated Vital Signs BP (!) 205/90 (BP Location: Left Arm)   Pulse (!) 107   Temp 97.9 F (36.6 C) (Oral)   Resp 18   Ht 5\' 3"  (1.6 m)   Wt 82.1 kg   SpO2 98%   BMI 32.06 kg/m   Physical Exam  Constitutional: She is oriented to person, place, and time. She appears well-developed and well-nourished. No distress.  HENT:  Head: Normocephalic and atraumatic.  Eyes: Conjunctivae are normal. Scleral icterus is present.  Neck: Normal range of motion.  Cardiovascular: Normal rate, regular rhythm and normal heart sounds.  Exam reveals no gallop and no friction rub.   No murmur heard. Pulmonary/Chest: Effort normal and breath sounds normal. No respiratory distress.  Abdominal: Soft. Bowel sounds are normal. She exhibits distension. She exhibits no mass. There is no tenderness. There is no guarding.  Positive for ascites, no tenderness, no heat or redness  Neurological: She  is alert and oriented to person, place, and time.  Alert and oriented x3, however patient is intermittently confused.  Skin: Skin is warm and dry. She is not diaphoretic.  Psychiatric: Her behavior is normal.  Nursing note and vitals reviewed.    ED Treatments / Results  Labs (all labs ordered are listed, but only abnormal results are displayed) Labs Reviewed  COMPREHENSIVE METABOLIC PANEL - Abnormal; Notable for the following:       Result Value   Sodium 128 (*)    Potassium 5.3 (*)    Chloride 93 (*)    Glucose, Bld 588 (*)    Creatinine, Ser 1.33 (*)    Albumin 2.8 (*)    AST 51 (*)    Alkaline Phosphatase 590 (*)    Total Bilirubin 1.7 (*)    GFR calc non Af Amer 39 (*)    GFR calc Af Amer 45 (*)    All other components within normal limits  CBC - Abnormal; Notable for the following:    RBC 2.97 (*)    Hemoglobin 9.2 (*)    HCT 29.2 (*)    Platelets 533 (*)    All other components within normal limits  URINALYSIS, ROUTINE W REFLEX MICROSCOPIC - Abnormal; Notable for the following:    Glucose, UA >=500 (*)    Protein, ur 100 (*)    Squamous Epithelial / LPF 0-5 (*)    All other components within normal limits  CBG MONITORING, ED - Abnormal; Notable for the following:    Glucose-Capillary 515 (*)    All other components within normal limits  CBG MONITORING, ED - Abnormal; Notable for the following:    Glucose-Capillary 456 (*)    All other components within normal limits  CBG MONITORING, ED - Abnormal; Notable for the following:    Glucose-Capillary 339 (*)    All other components within normal limits  AMMONIA  PROTIME-INR  I-STAT CG4 LACTIC ACID, ED    EKG  EKG Interpretation None       Radiology Ct Head W &  Wo Contrast  Result Date: 10/27/2016 CLINICAL DATA:  70 year old female with increasing confusion. Pancreatic cancer. EXAM: CT HEAD WITHOUT AND WITH CONTRAST TECHNIQUE: Contiguous axial images were obtained from the base of the skull through the  vertex without and with intravenous contrast CONTRAST:  67mL ISOVUE-300 IOPAMIDOL (ISOVUE-300) INJECTION 61% COMPARISON:  09/29/2016 head CT without contrast and earlier. FINDINGS: Brain: Stable cerebral volume. No midline shift, ventriculomegaly, mass effect, evidence of mass lesion, intracranial hemorrhage or evidence of cortically based acute infarction. Gray-white matter differentiation is within normal limits throughout the brain. No abnormal enhancement identified. No encephalomalacia identified. Vascular: Calcified atherosclerosis at the skull base. Major intracranial vascular structures appear to be enhancing. Skull: Negative.  No acute osseous abnormality identified. Sinuses/Orbits: Visualized paranasal sinuses and mastoids are stable and well pneumatized. Other: Negative orbit and scalp soft tissues. IMPRESSION: Normal for age CT appearance of the brain. No metastatic disease or acute intracranial abnormality. Electronically Signed   By: Genevie Ann M.D.   On: 10/27/2016 13:44   Ct Chest W Contrast  Result Date: 10/27/2016 CLINICAL DATA:  Pancreatic cancer. Worsening confusion and abdominal pain. EXAM: CT CHEST, ABDOMEN, AND PELVIS WITH CONTRAST TECHNIQUE: Multidetector CT imaging of the chest, abdomen and pelvis was performed following the standard protocol during bolus administration of intravenous contrast. CONTRAST:  40mL ISOVUE-300 IOPAMIDOL (ISOVUE-300) INJECTION 61% COMPARISON:  Multiple exams, including 10/07/2016 CT scan FINDINGS: CT CHEST FINDINGS Cardiovascular: Mild cardiomegaly. Interventricular septal thickness 1.7 cm compatible with mild left ventricular hypertrophy. Mediastinum/Nodes: Scattered small axillary and mediastinal lymph nodes are not pathologically enlarged. Calcified left infrahilar lymph node compatible with old granulomatous disease. Wall thickening in the distal esophagus. Lungs/Pleura: Subtle nodularity along the right minor fissure likely attributable to subpleural lymph  nodes. Mild reticulonodular opacities in both lungs, especially in dependent portions. A 5 mm right lower lobe nodule is present on image 101/6, no change from 09/20/16. A calcified granuloma in the left lower lobe measures 7 mm in diameter on image 99/6. Musculoskeletal: There is a notable central disc protrusion at T12-L1. CT ABDOMEN PELVIS FINDINGS Hepatobiliary: Pneumobilia. Punctate calcifications in the hepatic parenchyma from old granulomatous disease. Mild intrahepatic biliary dilatation in segment 6. 2.9 cm laminated gallstone in the gallbladder. Expandable stent in the common bile duct traversing the pancreatic head neck extending to the duodenum. Pancreas: Indistinctly marginated/infiltrative mass involving the pancreatic head and uncinate process, 4.9 by 4.1 cm on image 67/3, no fat plane separating the mass from the superior mesenteric artery posteriorly, some tributaries of the SMA run adjacent to the mass as well. The dorsal pancreatic duct is not overtly dilated at this time. Poor definition of fat planes around the common bile duct. Spleen: Old granulomatous disease Adrenals/Urinary Tract: Several hypodense renal lesions are present, including a 9 mm left kidney lesion on image 20/4 and a 8 mm lesion on image 19/4 in the right kidney, among others, this lesions are technically nonspecific although statistically likely to be benign lesions such as cysts. Adrenal glands unremarkable. Stomach/Bowel: The transverse duodenum is inseparable from the pancreatic mass. Sigmoid colon diverticulosis. Vascular/Lymphatic: No current portal vein or splenic vein thrombosis. Aortoiliac atherosclerotic vascular disease. Reproductive: Uterus absent.  Adnexa unremarkable. Other: No supplemental non-categorized findings. Musculoskeletal: Small umbilical hernia contains adipose tissue. Posterolateral rod and pedicle screw fixation and L3-L4-L5 IMPRESSION: 1. Enlarging pancreatic head mass, abutting the posterior margin  of the superior mesenteric artery and tributaries of the superior mesenteric vein. No specific findings of distant metastatic disease are currently identified.  Prior percutaneous stent/drain has been replaced with an expandable stent which traverses the mass, there is some borderline intrahepatic biliary dilatation in the right hepatic lobe but this is minimal. 2. Large gallstone. 3. Old granulomatous disease. 4. Mild cardiomegaly and left ventricular hypertrophy. 5. Wall thickening in the distal esophagus may reflect esophagitis. 6. Stable 5 mm right lower lobe nodule with some stable faint reticulonodular opacities in both lungs. Electronically Signed   By: Van Clines M.D.   On: 10/27/2016 14:00   Ct Abdomen Pelvis W Contrast  Result Date: 10/27/2016 CLINICAL DATA:  Pancreatic cancer. Worsening confusion and abdominal pain. EXAM: CT CHEST, ABDOMEN, AND PELVIS WITH CONTRAST TECHNIQUE: Multidetector CT imaging of the chest, abdomen and pelvis was performed following the standard protocol during bolus administration of intravenous contrast. CONTRAST:  56mL ISOVUE-300 IOPAMIDOL (ISOVUE-300) INJECTION 61% COMPARISON:  Multiple exams, including 10/07/2016 CT scan FINDINGS: CT CHEST FINDINGS Cardiovascular: Mild cardiomegaly. Interventricular septal thickness 1.7 cm compatible with mild left ventricular hypertrophy. Mediastinum/Nodes: Scattered small axillary and mediastinal lymph nodes are not pathologically enlarged. Calcified left infrahilar lymph node compatible with old granulomatous disease. Wall thickening in the distal esophagus. Lungs/Pleura: Subtle nodularity along the right minor fissure likely attributable to subpleural lymph nodes. Mild reticulonodular opacities in both lungs, especially in dependent portions. A 5 mm right lower lobe nodule is present on image 101/6, no change from 09/20/16. A calcified granuloma in the left lower lobe measures 7 mm in diameter on image 99/6. Musculoskeletal: There  is a notable central disc protrusion at T12-L1. CT ABDOMEN PELVIS FINDINGS Hepatobiliary: Pneumobilia. Punctate calcifications in the hepatic parenchyma from old granulomatous disease. Mild intrahepatic biliary dilatation in segment 6. 2.9 cm laminated gallstone in the gallbladder. Expandable stent in the common bile duct traversing the pancreatic head neck extending to the duodenum. Pancreas: Indistinctly marginated/infiltrative mass involving the pancreatic head and uncinate process, 4.9 by 4.1 cm on image 67/3, no fat plane separating the mass from the superior mesenteric artery posteriorly, some tributaries of the SMA run adjacent to the mass as well. The dorsal pancreatic duct is not overtly dilated at this time. Poor definition of fat planes around the common bile duct. Spleen: Old granulomatous disease Adrenals/Urinary Tract: Several hypodense renal lesions are present, including a 9 mm left kidney lesion on image 20/4 and a 8 mm lesion on image 19/4 in the right kidney, among others, this lesions are technically nonspecific although statistically likely to be benign lesions such as cysts. Adrenal glands unremarkable. Stomach/Bowel: The transverse duodenum is inseparable from the pancreatic mass. Sigmoid colon diverticulosis. Vascular/Lymphatic: No current portal vein or splenic vein thrombosis. Aortoiliac atherosclerotic vascular disease. Reproductive: Uterus absent.  Adnexa unremarkable. Other: No supplemental non-categorized findings. Musculoskeletal: Small umbilical hernia contains adipose tissue. Posterolateral rod and pedicle screw fixation and L3-L4-L5 IMPRESSION: 1. Enlarging pancreatic head mass, abutting the posterior margin of the superior mesenteric artery and tributaries of the superior mesenteric vein. No specific findings of distant metastatic disease are currently identified. Prior percutaneous stent/drain has been replaced with an expandable stent which traverses the mass, there is some  borderline intrahepatic biliary dilatation in the right hepatic lobe but this is minimal. 2. Large gallstone. 3. Old granulomatous disease. 4. Mild cardiomegaly and left ventricular hypertrophy. 5. Wall thickening in the distal esophagus may reflect esophagitis. 6. Stable 5 mm right lower lobe nodule with some stable faint reticulonodular opacities in both lungs. Electronically Signed   By: Van Clines M.D.   On: 10/27/2016 14:00  Procedures Procedures (including critical care time)  Medications Ordered in ED Medications  dextrose 5 %-0.45 % sodium chloride infusion (not administered)  insulin regular (NOVOLIN R,HUMULIN R) 250 Units in sodium chloride 0.9 % 250 mL (1 Units/mL) infusion (4 Units/hr Intravenous New Bag/Given 10/27/16 1348)  iopamidol (ISOVUE-300) 61 % injection (75 mLs  Contrast Given 10/27/16 1314)  HYDROmorphone (DILAUDID) injection 1 mg (1 mg Intravenous Given 10/27/16 1520)     Initial Impression / Assessment and Plan / ED Course  I have reviewed the triage vital signs and the nursing notes.  Pertinent labs & imaging results that were available during my care of the patient were reviewed by me and considered in my medical decision making (see chart for details).     ED ECG REPORT   Date: 10/27/2016  Rate: 101  Rhythm: sinus tachycardia  QRS Axis: normal  Intervals: normal  ST/T Wave abnormalities: normal  Conduction Disutrbances:none  Narrative Interpretation:   Old EKG Reviewed: unchanged  I have personally reviewed the EKG tracing and agree with the computerized printout as noted.  Patient with confusion. I think most likely this is encephalopathy. She has significant hyperglycemia and electrolyte abnormalities. She does have a 1 g drop in her hemoglobin. No evidence of hyperammonemia. Her lactic acidosis. Her blood pressure is elevated. Patient's EKG does not reflect peaked T waves and her hyperkalemia is mild. She is receiving fluids, insulin for glycemic  correction. I have given the patient pain medication for her epigastric pain. Her CTs show no evidence of acute metastasis or changes in her previous CT scans. No evidence of acute metastasis to the brain or other intracranial abnormalities. The patient will need admission for her blood sugar and encephalopathy.  Final Clinical Impressions(s) / ED Diagnoses   Final diagnoses:  Confusion  Hyperglycemia  Hyperkalemia  Hyponatremia  Encephalopathy  Hypertension, unspecified type    New Prescriptions New Prescriptions   No medications on file     Margarita Mail, PA-C 10/27/16 Devers, Kevin, MD 10/27/16 [phone]

## 2016-10-27 NOTE — Progress Notes (Signed)
Insulin drip d/c'd per previous order. MD notified. Sliding scale insulin orders placed for [facility].

## 2016-10-28 DIAGNOSIS — C250 Malignant neoplasm of head of pancreas: Secondary | ICD-10-CM | POA: Diagnosis not present

## 2016-10-28 DIAGNOSIS — E871 Hypo-osmolality and hyponatremia: Secondary | ICD-10-CM | POA: Diagnosis not present

## 2016-10-28 DIAGNOSIS — N183 Chronic kidney disease, stage 3 (moderate): Secondary | ICD-10-CM | POA: Diagnosis not present

## 2016-10-28 DIAGNOSIS — R410 Disorientation, unspecified: Secondary | ICD-10-CM | POA: Diagnosis not present

## 2016-10-28 DIAGNOSIS — E1165 Type 2 diabetes mellitus with hyperglycemia: Secondary | ICD-10-CM | POA: Diagnosis not present

## 2016-10-28 DIAGNOSIS — I10 Essential (primary) hypertension: Secondary | ICD-10-CM

## 2016-10-28 DIAGNOSIS — E1169 Type 2 diabetes mellitus with other specified complication: Secondary | ICD-10-CM

## 2016-10-28 DIAGNOSIS — Z794 Long term (current) use of insulin: Secondary | ICD-10-CM

## 2016-10-28 DIAGNOSIS — IMO0002 Reserved for concepts with insufficient information to code with codable children: Secondary | ICD-10-CM

## 2016-10-28 LAB — COMPREHENSIVE METABOLIC PANEL
ALBUMIN: 2.2 g/dL — AB (ref 3.5–5.0)
ALK PHOS: 439 U/L — AB (ref 38–126)
ALT: 26 U/L (ref 14–54)
AST: 39 U/L (ref 15–41)
Anion gap: 9 (ref 5–15)
BUN: 15 mg/dL (ref 6–20)
CHLORIDE: 97 mmol/L — AB (ref 101–111)
CO2: 25 mmol/L (ref 22–32)
CREATININE: 1 mg/dL (ref 0.44–1.00)
Calcium: 8.9 mg/dL (ref 8.9–10.3)
GFR calc Af Amer: >60 mL/min (ref >60)
GFR calc non Af Amer: 55 mL/min — ABNORMAL LOW (ref >60)
GLUCOSE: 248 mg/dL — AB (ref 65–99)
Potassium: 5.1 mmol/L (ref 3.5–5.1)
SODIUM: 131 mmol/L — AB (ref 135–145)
Total Bilirubin: 1.2 mg/dL (ref 0.3–1.2)
Total Protein: 5.6 g/dL — ABNORMAL LOW (ref 6.5–8.1)

## 2016-10-28 LAB — CBC
HCT: 25.2 % — ABNORMAL LOW (ref 36.0–46.0)
Hemoglobin: 8.1 g/dL — ABNORMAL LOW (ref 12.0–15.0)
MCH: 30.8 pg (ref 26.0–34.0)
MCHC: 32.1 g/dL (ref 30.0–36.0)
MCV: 95.8 fL (ref 78.0–100.0)
PLATELETS: 423 10*3/uL — AB (ref 150–400)
RBC: 2.63 MIL/uL — AB (ref 3.87–5.11)
RDW: 14.2 % (ref 11.5–15.5)
WBC: 6.4 10*3/uL (ref 4.0–10.5)

## 2016-10-28 LAB — VITAMIN B12: Vitamin B-12: 2578 pg/mL — ABNORMAL HIGH (ref 180–914)

## 2016-10-28 LAB — GLUCOSE, CAPILLARY
GLUCOSE-CAPILLARY: 266 mg/dL — AB (ref 65–99)
Glucose-Capillary: 125 mg/dL — ABNORMAL HIGH (ref 65–99)
Glucose-Capillary: 276 mg/dL — ABNORMAL HIGH (ref 65–99)
Glucose-Capillary: 215 mg/dL — ABNORMAL HIGH (ref 65–99)

## 2016-10-28 LAB — AMMONIA: AMMONIA: 39 umol/L — AB (ref 9–35)

## 2016-10-28 MED ORDER — SODIUM CHLORIDE 0.9 % IV BOLUS (SEPSIS): 500.0000 mL | Freq: Once | INTRAVENOUS | Status: AC

## 2016-10-28 MED ORDER — SODIUM CHLORIDE 0.9 % IV BOLUS (SEPSIS)
500.0000 mL | Freq: Once | INTRAVENOUS | Status: AC
  Administered 2016-10-28: 500 mL via INTRAVENOUS

## 2016-10-28 MED ORDER — RISPERIDONE 1 MG PO TABS
1.0000 mg | ORAL_TABLET | Freq: Every day | ORAL | Status: DC
  Administered 2016-10-28 – 2016-10-29 (×2): 1 mg via ORAL
  Filled 2016-10-28 (×2): qty 1

## 2016-10-28 MED ORDER — ACETAMINOPHEN 325 MG PO TABS
650.0000 mg | ORAL_TABLET | ORAL | Status: DC | PRN
  Administered 2016-10-28 – 2016-10-30 (×2): 650 mg via ORAL
  Filled 2016-10-28 (×2): qty 2

## 2016-10-28 MED ORDER — SODIUM CHLORIDE 0.9 % IV SOLN
INTRAVENOUS | Status: DC
  Administered 2016-10-28: 23:00:00 via INTRAVENOUS
  Administered 2016-10-29: 75 mL/h via INTRAVENOUS
  Administered 2016-10-31: 03:00:00 via INTRAVENOUS

## 2016-10-28 MED ORDER — DULOXETINE HCL 30 MG PO CPEP
60.0000 mg | ORAL_CAPSULE | Freq: Every day | ORAL | Status: DC
  Administered 2016-10-28 – 2016-10-30 (×3): 60 mg via ORAL
  Filled 2016-10-28 (×3): qty 2

## 2016-10-28 NOTE — Progress Notes (Signed)
Date: Oct 28, 2016 Discharge orders review for case management needs.  No dc written but consult for [facility]/spoke with the patient and husband would like [facility] or who ever is in network for [facility].  Will try [facility] first choice. Velva Harman, BSN, [place], CCM: [phone]

## 2016-10-28 NOTE — Progress Notes (Signed)
Inpatient Diabetes Program Recommendations  AACE/ADA: New Consensus Statement on Inpatient Glycemic Control (2015)  Target Ranges:  Prepandial:   less than 140 mg/dL      Peak postprandial:   less than 180 mg/dL (1-2 hours)      Critically ill patients:  140 - 180 mg/dL   Lab Results  Component Value Date   GLUCAP 266 (H) 10/28/2016   HGBA1C 10.5 (H) 08/12/2016    Review of Glycemic Control  Diabetes history: DM2 Outpatient Diabetes medications: metaglip 03/999 mg bid, Toujeo 50 units QHS Current orders for Inpatient glycemic control: Lantus 45 units QHS, Novolog 0-15 units tidwc Last HgbA1C - 10.5% on 08/12/2016  Inpatient Diabetes Program Recommendations:    Need updated HgbA1C to assess glycemic control prior to admission. Increase Lantus to 50 units QHS  Will continue to follow.  Thank you. Lorenda Peck, RD, LDN, CDE Inpatient Diabetes Coordinator [phone]

## 2016-10-28 NOTE — Progress Notes (Signed)
PT Cancellation Note  Patient Details Name: Patricia Austin MRN: [id number] DOB: 1946/05/17   Cancelled Treatment:    Reason Eval/Treat Not Completed: Fatigue/lethargy limiting ability to participate (spouse in room. patient asleep. will check back aother time.)   Marcelino Freestone PT [phone]  10/28/2016, 3:33 PM

## 2016-10-28 NOTE — Care Management Note (Addendum)
Case Management Note  Patient Details  Name: Patricia Austin MRN: [id number] Date of Birth: [birth year]  Subjective/Objective:     Copied from chart assessment:  71 y.o. female with medical history significant of pancreatic adenocarcinoma diagnosed 2 months ago, with obstructive cholangitis status post drain placement which was later removed and now has an internal stent, diabetes, chronic kidney disease stage III, hypertension, is being brought to the emergency room by patient's husband due to intermittent confusion and elevated blood sugars.  In the ED, patient is clearly confused in the room, and cannot reliably contribute to the story.  She is quite upset that she is here, she is quite upset that she is going come to the hospital.  She is still thinks that she has a biliary drain in place, but remembers that that was removed when her husband redirect her.  She was found to be significantly hyperglycemic, and patient tells me that she is compliant with her insulin regimen.  When asked how many units she is taking, she tells me 30 despite appearing that she takes 50 units in epic.  She can tell me that she has no chest pain, no shortness of breath, she has had no fever or chills, she has abdominal pain which sometimes goes into her back, not associated with nausea or vomiting.  She has been complaining of increased thirst on and off.              Action/Plan:  From home with spouse  Date:  Oct 28, 2016  Chart reviewed for concurrent status and case management needs.  Will continue to follow patient progress.  Discharge Planning: following for needs  Expected discharge date: [id number]  Velva Harman, BSN, [place], [place]  Expected Discharge Date:   (unknown)               Expected Discharge Plan:  Home/Self Care  In-House Referral:     Discharge planning Services  CM Consult  Post Acute Care Choice:    Choice offered to:     DME Arranged:    DME Agency:     HH Arranged:    [place] Agency:      Status of Service:  In process, will continue to follow  If discussed at Long Length of Stay Meetings, dates discussed:    Additional Comments:  Leeroy Cha, RN 10/28/2016, 9:04 AM

## 2016-10-28 NOTE — Progress Notes (Addendum)
PROGRESS NOTE    Patricia Austin   KPT:[id number]  DOB: 10-10-1945  DOA: 10/27/2016 PCP: Chevis Pretty, FNP   Brief Narrative:  Patricia Austin is a 71 y.o. female with medical history significant of pancreatic adenocarcinoma diagnosed 2 months ago, with obstructive cholangitis status post drain placement which was later removed and now has an internal stent, diabetes, chronic kidney disease stage III, hypertension, is being brought to the emergency room by patient's husband due to intermittent confusion and elevated blood sugars.     Subjective: Per husband, confusion has been going on for many months now and getting worse. He is not sure if she is taking her medications appropriately and she will not allow him to give her medications. Today she states she take 19 ml (not units) of Tuojeo once a week and then corrects herself states "daily"  Assessment & Plan:   Principal Problem:   Hyperglycemia/   DM (diabetes mellitus), type 2, uncontrolled ([place]) - last A1c 10.5 on 2/18 - due to confusion, patient cannot be trusted to give herself her medications - cont Lantus and SSI in hospital and follow sugars to ensure that current dose is controlling them  Active Problems:   Confusion - possibly dementia - needs work up and management- would not be a safe discharge if patient continues to refuse medications - Ammonia level only mildly elevated-  Doubt Lactulose will be beneficial - B12 normal - RPR and MRI ordered and Pending - start Risperdal tonight - per husband she is not sleeping at night and sleeping during the day - if MRI negative and no other cause for confusion can be found, will need to treat as dementia - limit medications that can cloud sensorium- d/c Ativan and Hydroxyzine  Hyponatremia - due to hyperglycemia and dehydration - given a 500 cc bolus today and will give her NS infusion overnight- cont following   Mild renal insufficiency and hyperkalemia - possibly due to  dehydration and ACE- has resolved   HTN - Coreg, Lotesin, Amlodipine with holding parameters  Pancreatic cancer  - she has and appt with Dr Ammie Dalton to discuss her recent CT scans and further treatment- I am not certain if he will consider offering chemo with her current mental state-  have notified Dr Ammie Dalton of admission - he will see her in the AM    CKD (chronic kidney disease) stage 3, GFR 30-59 ml/min  DVT prophylaxis: Lovenox Code Status: Full code Family Communication: husband Disposition Plan: home when stable and safe plan in place Consultants:   oncology Procedures:    Antimicrobials:  Anti-infectives    None       Objective: Vitals:   10/27/16 1958 10/27/16 2227 10/28/16 0457 10/28/16 1420  BP: (!) 153/79  115/84 (!) 100/49  Pulse: (!) 106  77 87  Resp: 18  18 18   Temp: 97.7 F (36.5 C) 97.7 F (36.5 C) 98.4 F (36.9 C) 98.6 F (37 C)  TempSrc: Oral Oral Oral Oral  SpO2: 100%  98% 98%  Weight:      Height:        Intake/Output Summary (Last 24 hours) at 10/28/16 1614 Last data filed at 10/28/16 0400  Gross per 24 hour  Intake           720.27 ml  Output                0 ml  Net           720.27  ml   Filed Weights   10/27/16 1109  Weight: 82.1 kg (181 lb)    Examination: General exam: Appears comfortable  HEENT: PERRLA, oral mucosa moist, no sclera icterus or thrush Respiratory system: Clear to auscultation. Respiratory effort normal. Cardiovascular system: S1 & S2 heard, RRR.  No murmurs  Gastrointestinal system: Abdomen soft, non-tender, nondistended. Normal bowel sound. No organomegaly Central nervous system: Alert-   No focal neurological deficits. Extremities: No cyanosis, clubbing or edema Skin: No rashes or ulcers Psychiatry:    confused to time, place and situation otherwise calm and cooperative    Data Reviewed: I have personally reviewed following labs and imaging studies  CBC:  Recent Labs Lab 10/27/16 1120 10/28/16 0654   WBC 7.8 6.4  HGB 9.2* 8.1*  HCT 29.2* 25.2*  MCV 98.3 95.8  PLT 533* 829*   Basic Metabolic Panel:  Recent Labs Lab 10/22/16 1343 10/27/16 1120 10/28/16 0654  NA 133* 128* 131*  K 4.5 5.3* 5.1  CL  --  93* 97*  CO2 29 27 25   GLUCOSE 542* 588* 248*  BUN 8.6 16 15   CREATININE 1.3* 1.33* 1.00  CALCIUM 10.0 9.4 8.9   GFR: Estimated Creatinine Clearance: 51.6 mL/min (by C-G formula based on SCr of 1 mg/dL). Liver Function Tests:  Recent Labs Lab 10/22/16 1343 10/27/16 1120 10/28/16 0654  AST 46* 51* 39  ALT 33 33 26  ALKPHOS 615* 590* 439*  BILITOT 2.11* 1.7* 1.2  PROT 7.0 7.2 5.6*  ALBUMIN 2.4* 2.8* 2.2*   No results for input(s): LIPASE, AMYLASE in the last 168 hours.  Recent Labs Lab 10/27/16 1236 10/28/16 1057  AMMONIA 29 39*   Coagulation Profile:  Recent Labs Lab 10/27/16 1125  INR 0.92   Cardiac Enzymes: No results for input(s): CKTOTAL, CKMB, CKMBINDEX, TROPONINI in the last 168 hours. BNP (last 3 results) No results for input(s): PROBNP in the last 8760 hours. HbA1C: No results for input(s): HGBA1C in the last 72 hours. CBG:  Recent Labs Lab 10/27/16 1941 10/27/16 2052 10/27/16 2226 10/28/16 0734 10/28/16 1207  GLUCAP 171* 175* 155* 266* 125*   Lipid Profile: No results for input(s): CHOL, HDL, LDLCALC, TRIG, CHOLHDL, LDLDIRECT in the last 72 hours. Thyroid Function Tests: No results for input(s): TSH, T4TOTAL, FREET4, T3FREE, THYROIDAB in the last 72 hours. Anemia Panel:  Recent Labs  10/28/16 1057  VITAMINB12 2,578*   Urine analysis:    Component Value Date/Time   COLORURINE YELLOW 10/27/2016 [place] 10/27/2016 1246   LABSPEC 1.020 10/27/2016 1246   PHURINE 7.0 10/27/2016 1246   GLUCOSEU >=500 (A) 10/27/2016 1246   HGBUR NEGATIVE 10/27/2016 1246   BILIRUBINUR NEGATIVE 10/27/2016 1246   BILIRUBINUR negative 04/06/2013 1723   KETONESUR NEGATIVE 10/27/2016 1246   PROTEINUR 100 (A) 10/27/2016 1246    UROBILINOGEN 1.0 12/06/2013 1244   NITRITE NEGATIVE 10/27/2016 1246   LEUKOCYTESUR NEGATIVE 10/27/2016 1246   Sepsis Labs: @LABRCNTIP (procalcitonin:4,lacticidven:4) )No results found for this or any previous visit (from the past 240 hour(s)).       Radiology Studies: Ct Head W & Wo Contrast  Result Date: 10/27/2016 CLINICAL DATA:  71 year old female with increasing confusion. Pancreatic cancer. EXAM: CT HEAD WITHOUT AND WITH CONTRAST TECHNIQUE: Contiguous axial images were obtained from the base of the skull through the vertex without and with intravenous contrast CONTRAST:  21mL ISOVUE-300 IOPAMIDOL (ISOVUE-300) INJECTION 61% COMPARISON:  09/29/2016 head CT without contrast and earlier. FINDINGS: Brain: Stable cerebral volume. No midline shift,  ventriculomegaly, mass effect, evidence of mass lesion, intracranial hemorrhage or evidence of cortically based acute infarction. Gray-white matter differentiation is within normal limits throughout the brain. No abnormal enhancement identified. No encephalomalacia identified. Vascular: Calcified atherosclerosis at the skull base. Major intracranial vascular structures appear to be enhancing. Skull: Negative.  No acute osseous abnormality identified. Sinuses/Orbits: Visualized paranasal sinuses and mastoids are stable and well pneumatized. Other: Negative orbit and scalp soft tissues. IMPRESSION: Normal for age CT appearance of the brain. No metastatic disease or acute intracranial abnormality. Electronically Signed   By: Genevie Ann M.D.   On: 10/27/2016 13:44   Ct Chest W Contrast  Result Date: 10/27/2016 CLINICAL DATA:  Pancreatic cancer. Worsening confusion and abdominal pain. EXAM: CT CHEST, ABDOMEN, AND PELVIS WITH CONTRAST TECHNIQUE: Multidetector CT imaging of the chest, abdomen and pelvis was performed following the standard protocol during bolus administration of intravenous contrast. CONTRAST:  74mL ISOVUE-300 IOPAMIDOL (ISOVUE-300) INJECTION 61%  COMPARISON:  Multiple exams, including 10/07/2016 CT scan FINDINGS: CT CHEST FINDINGS Cardiovascular: Mild cardiomegaly. Interventricular septal thickness 1.7 cm compatible with mild left ventricular hypertrophy. Mediastinum/Nodes: Scattered small axillary and mediastinal lymph nodes are not pathologically enlarged. Calcified left infrahilar lymph node compatible with old granulomatous disease. Wall thickening in the distal esophagus. Lungs/Pleura: Subtle nodularity along the right minor fissure likely attributable to subpleural lymph nodes. Mild reticulonodular opacities in both lungs, especially in dependent portions. A 5 mm right lower lobe nodule is present on image 101/6, no change from 09/20/16. A calcified granuloma in the left lower lobe measures 7 mm in diameter on image 99/6. Musculoskeletal: There is a notable central disc protrusion at T12-L1. CT ABDOMEN PELVIS FINDINGS Hepatobiliary: Pneumobilia. Punctate calcifications in the hepatic parenchyma from old granulomatous disease. Mild intrahepatic biliary dilatation in segment 6. 2.9 cm laminated gallstone in the gallbladder. Expandable stent in the common bile duct traversing the pancreatic head neck extending to the duodenum. Pancreas: Indistinctly marginated/infiltrative mass involving the pancreatic head and uncinate process, 4.9 by 4.1 cm on image 67/3, no fat plane separating the mass from the superior mesenteric artery posteriorly, some tributaries of the SMA run adjacent to the mass as well. The dorsal pancreatic duct is not overtly dilated at this time. Poor definition of fat planes around the common bile duct. Spleen: Old granulomatous disease Adrenals/Urinary Tract: Several hypodense renal lesions are present, including a 9 mm left kidney lesion on image 20/4 and a 8 mm lesion on image 19/4 in the right kidney, among others, this lesions are technically nonspecific although statistically likely to be benign lesions such as cysts. Adrenal glands  unremarkable. Stomach/Bowel: The transverse duodenum is inseparable from the pancreatic mass. Sigmoid colon diverticulosis. Vascular/Lymphatic: No current portal vein or splenic vein thrombosis. Aortoiliac atherosclerotic vascular disease. Reproductive: Uterus absent.  Adnexa unremarkable. Other: No supplemental non-categorized findings. Musculoskeletal: Small umbilical hernia contains adipose tissue. Posterolateral rod and pedicle screw fixation and L3-L4-L5 IMPRESSION: 1. Enlarging pancreatic head mass, abutting the posterior margin of the superior mesenteric artery and tributaries of the superior mesenteric vein. No specific findings of distant metastatic disease are currently identified. Prior percutaneous stent/drain has been replaced with an expandable stent which traverses the mass, there is some borderline intrahepatic biliary dilatation in the right hepatic lobe but this is minimal. 2. Large gallstone. 3. Old granulomatous disease. 4. Mild cardiomegaly and left ventricular hypertrophy. 5. Wall thickening in the distal esophagus may reflect esophagitis. 6. Stable 5 mm right lower lobe nodule with some stable faint reticulonodular opacities in  both lungs. Electronically Signed   By: Van Clines M.D.   On: 10/27/2016 14:00   Ct Abdomen Pelvis W Contrast  Result Date: 10/27/2016 CLINICAL DATA:  Pancreatic cancer. Worsening confusion and abdominal pain. EXAM: CT CHEST, ABDOMEN, AND PELVIS WITH CONTRAST TECHNIQUE: Multidetector CT imaging of the chest, abdomen and pelvis was performed following the standard protocol during bolus administration of intravenous contrast. CONTRAST:  23mL ISOVUE-300 IOPAMIDOL (ISOVUE-300) INJECTION 61% COMPARISON:  Multiple exams, including 10/07/2016 CT scan FINDINGS: CT CHEST FINDINGS Cardiovascular: Mild cardiomegaly. Interventricular septal thickness 1.7 cm compatible with mild left ventricular hypertrophy. Mediastinum/Nodes: Scattered small axillary and mediastinal  lymph nodes are not pathologically enlarged. Calcified left infrahilar lymph node compatible with old granulomatous disease. Wall thickening in the distal esophagus. Lungs/Pleura: Subtle nodularity along the right minor fissure likely attributable to subpleural lymph nodes. Mild reticulonodular opacities in both lungs, especially in dependent portions. A 5 mm right lower lobe nodule is present on image 101/6, no change from 09/20/16. A calcified granuloma in the left lower lobe measures 7 mm in diameter on image 99/6. Musculoskeletal: There is a notable central disc protrusion at T12-L1. CT ABDOMEN PELVIS FINDINGS Hepatobiliary: Pneumobilia. Punctate calcifications in the hepatic parenchyma from old granulomatous disease. Mild intrahepatic biliary dilatation in segment 6. 2.9 cm laminated gallstone in the gallbladder. Expandable stent in the common bile duct traversing the pancreatic head neck extending to the duodenum. Pancreas: Indistinctly marginated/infiltrative mass involving the pancreatic head and uncinate process, 4.9 by 4.1 cm on image 67/3, no fat plane separating the mass from the superior mesenteric artery posteriorly, some tributaries of the SMA run adjacent to the mass as well. The dorsal pancreatic duct is not overtly dilated at this time. Poor definition of fat planes around the common bile duct. Spleen: Old granulomatous disease Adrenals/Urinary Tract: Several hypodense renal lesions are present, including a 9 mm left kidney lesion on image 20/4 and a 8 mm lesion on image 19/4 in the right kidney, among others, this lesions are technically nonspecific although statistically likely to be benign lesions such as cysts. Adrenal glands unremarkable. Stomach/Bowel: The transverse duodenum is inseparable from the pancreatic mass. Sigmoid colon diverticulosis. Vascular/Lymphatic: No current portal vein or splenic vein thrombosis. Aortoiliac atherosclerotic vascular disease. Reproductive: Uterus absent.   Adnexa unremarkable. Other: No supplemental non-categorized findings. Musculoskeletal: Small umbilical hernia contains adipose tissue. Posterolateral rod and pedicle screw fixation and L3-L4-L5 IMPRESSION: 1. Enlarging pancreatic head mass, abutting the posterior margin of the superior mesenteric artery and tributaries of the superior mesenteric vein. No specific findings of distant metastatic disease are currently identified. Prior percutaneous stent/drain has been replaced with an expandable stent which traverses the mass, there is some borderline intrahepatic biliary dilatation in the right hepatic lobe but this is minimal. 2. Large gallstone. 3. Old granulomatous disease. 4. Mild cardiomegaly and left ventricular hypertrophy. 5. Wall thickening in the distal esophagus may reflect esophagitis. 6. Stable 5 mm right lower lobe nodule with some stable faint reticulonodular opacities in both lungs. Electronically Signed   By: Van Clines M.D.   On: 10/27/2016 14:00      Scheduled Meds: . amLODipine  5 mg Oral Daily  . benazepril  40 mg Oral QHS  . carvedilol  25 mg Oral BID WC  . DULoxetine  60 mg Oral QHS  . enoxaparin (LOVENOX) injection  40 mg Subcutaneous Q24H  . insulin aspart  0-15 Units Subcutaneous TID WC  . insulin glargine  45 Units Subcutaneous QHS  . pantoprazole  40 mg Oral QAC breakfast   Continuous Infusions:   LOS: 0 days    Time spent in minutes: 35    Debbe Odea, MD [facility] Pager: [email or web address] Password [email or web address] 10/28/2016, 4:14 PM

## 2016-10-29 ENCOUNTER — Ambulatory Visit: Payer: Self-pay | Admitting: Oncology

## 2016-10-29 ENCOUNTER — Observation Stay (HOSPITAL_COMMUNITY): Payer: Medicare Other

## 2016-10-29 DIAGNOSIS — F329 Major depressive disorder, single episode, unspecified: Secondary | ICD-10-CM | POA: Diagnosis present

## 2016-10-29 DIAGNOSIS — R4182 Altered mental status, unspecified: Secondary | ICD-10-CM | POA: Diagnosis not present

## 2016-10-29 DIAGNOSIS — E871 Hypo-osmolality and hyponatremia: Secondary | ICD-10-CM | POA: Diagnosis present

## 2016-10-29 DIAGNOSIS — I129 Hypertensive chronic kidney disease with stage 1 through stage 4 chronic kidney disease, or unspecified chronic kidney disease: Secondary | ICD-10-CM | POA: Diagnosis present

## 2016-10-29 DIAGNOSIS — D638 Anemia in other chronic diseases classified elsewhere: Secondary | ICD-10-CM | POA: Diagnosis present

## 2016-10-29 DIAGNOSIS — E875 Hyperkalemia: Secondary | ICD-10-CM | POA: Diagnosis present

## 2016-10-29 DIAGNOSIS — M797 Fibromyalgia: Secondary | ICD-10-CM | POA: Diagnosis present

## 2016-10-29 DIAGNOSIS — G9349 Other encephalopathy: Secondary | ICD-10-CM | POA: Diagnosis present

## 2016-10-29 DIAGNOSIS — Z96652 Presence of left artificial knee joint: Secondary | ICD-10-CM | POA: Diagnosis present

## 2016-10-29 DIAGNOSIS — E1165 Type 2 diabetes mellitus with hyperglycemia: Secondary | ICD-10-CM | POA: Diagnosis not present

## 2016-10-29 DIAGNOSIS — R739 Hyperglycemia, unspecified: Secondary | ICD-10-CM | POA: Diagnosis not present

## 2016-10-29 DIAGNOSIS — N183 Chronic kidney disease, stage 3 (moderate): Secondary | ICD-10-CM | POA: Diagnosis present

## 2016-10-29 DIAGNOSIS — C259 Malignant neoplasm of pancreas, unspecified: Secondary | ICD-10-CM | POA: Diagnosis not present

## 2016-10-29 DIAGNOSIS — G8929 Other chronic pain: Secondary | ICD-10-CM | POA: Diagnosis present

## 2016-10-29 DIAGNOSIS — M545 Low back pain: Secondary | ICD-10-CM | POA: Diagnosis present

## 2016-10-29 DIAGNOSIS — R911 Solitary pulmonary nodule: Secondary | ICD-10-CM | POA: Diagnosis present

## 2016-10-29 DIAGNOSIS — C258 Malignant neoplasm of overlapping sites of pancreas: Secondary | ICD-10-CM

## 2016-10-29 DIAGNOSIS — K224 Dyskinesia of esophagus: Secondary | ICD-10-CM | POA: Diagnosis present

## 2016-10-29 DIAGNOSIS — E860 Dehydration: Secondary | ICD-10-CM | POA: Diagnosis present

## 2016-10-29 DIAGNOSIS — F419 Anxiety disorder, unspecified: Secondary | ICD-10-CM | POA: Diagnosis present

## 2016-10-29 DIAGNOSIS — R410 Disorientation, unspecified: Secondary | ICD-10-CM | POA: Diagnosis not present

## 2016-10-29 DIAGNOSIS — N179 Acute kidney failure, unspecified: Secondary | ICD-10-CM | POA: Diagnosis present

## 2016-10-29 DIAGNOSIS — E11649 Type 2 diabetes mellitus with hypoglycemia without coma: Secondary | ICD-10-CM | POA: Diagnosis present

## 2016-10-29 DIAGNOSIS — Z9114 Patient's other noncompliance with medication regimen: Secondary | ICD-10-CM | POA: Diagnosis not present

## 2016-10-29 DIAGNOSIS — E785 Hyperlipidemia, unspecified: Secondary | ICD-10-CM | POA: Diagnosis present

## 2016-10-29 DIAGNOSIS — K219 Gastro-esophageal reflux disease without esophagitis: Secondary | ICD-10-CM | POA: Diagnosis present

## 2016-10-29 DIAGNOSIS — E1122 Type 2 diabetes mellitus with diabetic chronic kidney disease: Secondary | ICD-10-CM | POA: Diagnosis present

## 2016-10-29 DIAGNOSIS — K830 Cholangitis: Secondary | ICD-10-CM | POA: Diagnosis present

## 2016-10-29 LAB — BASIC METABOLIC PANEL
Anion gap: 6 (ref 5–15)
BUN: 19 mg/dL (ref 6–20)
CHLORIDE: 100 mmol/L — AB (ref 101–111)
CO2: 26 mmol/L (ref 22–32)
Calcium: 8.8 mg/dL — ABNORMAL LOW (ref 8.9–10.3)
Creatinine, Ser: 1.29 mg/dL — ABNORMAL HIGH (ref 0.44–1.00)
GFR calc Af Amer: 47 mL/min — ABNORMAL LOW (ref >60)
GFR calc non Af Amer: 40 mL/min — ABNORMAL LOW (ref >60)
GLUCOSE: 157 mg/dL — AB (ref 65–99)
POTASSIUM: 4.6 mmol/L (ref 3.5–5.1)
Sodium: 132 mmol/L — ABNORMAL LOW (ref 135–145)

## 2016-10-29 LAB — RPR: RPR Ser Ql: NONREACTIVE

## 2016-10-29 LAB — GLUCOSE, CAPILLARY
GLUCOSE-CAPILLARY: 213 mg/dL — AB (ref 65–99)
Glucose-Capillary: 128 mg/dL — ABNORMAL HIGH (ref 65–99)
Glucose-Capillary: 140 mg/dL — ABNORMAL HIGH (ref 65–99)
Glucose-Capillary: 153 mg/dL — ABNORMAL HIGH (ref 65–99)

## 2016-10-29 MED ORDER — LACTULOSE 10 GM/15ML PO SOLN
20.0000 g | Freq: Every day | ORAL | Status: DC
  Administered 2016-10-29 – 2016-10-30 (×2): 20 g via ORAL
  Filled 2016-10-29 (×2): qty 30

## 2016-10-29 MED ORDER — GADOBENATE DIMEGLUMINE 529 MG/ML IV SOLN
20.0000 mL | Freq: Once | INTRAVENOUS | Status: AC | PRN
  Administered 2016-10-29: 17 mL via INTRAVENOUS

## 2016-10-29 NOTE — Progress Notes (Addendum)
[facility] PROGRESS NOTE  Patricia Austin JIR:[id number] DOB: January 28, 1946 DOA: 10/27/2016 PCP: Chevis Pretty, FNP  Assessment/Plan: Brief Narrative:  Patricia Austin a 71 y.o.femalewith medical history significant of pancreatic adenocarcinoma diagnosed 2 months ago, with obstructive cholangitis status post drain placement which was later removed and now has an internal stent, diabetes, chronic kidney disease stage III, hypertension, is being brought to the emergency room by patient's husband due to intermittent confusion and elevated blood sugars.     1-Hyperglycemia/   DM (diabetes mellitus), type 2, uncontrolled ([place]); Last HB-A1c at 10.  Continue with lantus, SSI.  Will need safe discharge plan for administration of medications. She has been cooperative in the hospital./   2-Confusion, Acute encephalopathy; Unclear etiology MRI to be done this afternoon.  Not significant elevation of ammonia level, but will start lactulose low dose.  Started on risperidone.  Psych consulted for capacity\ RPR, negative, B12 normal.  No evidence of infection   Hyponatremia; improving with IV fluids.   Mild AKI, on CKD stage III, hyperkalemia;  Hold ACE. IV fluids.   HTN; SBP soft, Continue with coreg, hold norvasc and Lotensin.   Pancreatic cancer;  Appreciate Dr Benay Spice evaluation.   Lung nodule. Stable on CT follow up outpatient.   Code Status: Full code.  Family Communication: Husband who was at bedside.  Disposition Plan: might need SNF. Psych consulted for evaluation of capacity    Consultants:  Psych  Dr Benay Spice.   Procedures: None  Antibiotics:  none  HPI/Subjective: Patient is alert, follows command. She denies headaches. She is confused. She was not able to tell me why she is in the hospital, she was not able to tell me about her history of pancreatic cancer.   Patient has been declining help from her husband for medications.   Objective: Vitals:    10/29/16 0459 10/29/16 1034  BP: (!) 113/49 (!) 110/36  Pulse: 75   Resp: 18   Temp: 98.4 F (36.9 C)     Intake/Output Summary (Last 24 hours) at 10/29/16 1430 Last data filed at 10/29/16 0300  Gross per 24 hour  Intake           923.75 ml  Output                0 ml  Net           923.75 ml   Filed Weights   10/27/16 1109  Weight: 82.1 kg (181 lb)    Exam:   General:  NAD, alert, follows command  Cardiovascular: S 1, S 2 RRR  Respiratory: CTA  Abdomen: BS present, soft, nt  Musculoskeletal: no edema  Neuro, alert, oriented to person, confuse.   Data Reviewed: Basic Metabolic Panel:  Recent Labs Lab 10/27/16 1120 10/28/16 0654 10/29/16 0549  NA 128* 131* 132*  K 5.3* 5.1 4.6  CL 93* 97* 100*  CO2 27 25 26   GLUCOSE 588* 248* 157*  BUN 16 15 19   CREATININE 1.33* 1.00 1.29*  CALCIUM 9.4 8.9 8.8*   Liver Function Tests:  Recent Labs Lab 10/27/16 1120 10/28/16 0654  AST 51* 39  ALT 33 26  ALKPHOS 590* 439*  BILITOT 1.7* 1.2  PROT 7.2 5.6*  ALBUMIN 2.8* 2.2*   No results for input(s): LIPASE, AMYLASE in the last 168 hours.  Recent Labs Lab 10/27/16 1236 10/28/16 1057  AMMONIA 29 39*   CBC:  Recent Labs Lab 10/27/16 1120 10/28/16 0654  WBC 7.8 6.4  HGB  9.2* 8.1*  HCT 29.2* 25.2*  MCV 98.3 95.8  PLT 533* 423*   Cardiac Enzymes: No results for input(s): CKTOTAL, CKMB, CKMBINDEX, TROPONINI in the last 168 hours. BNP (last 3 results) No results for input(s): BNP in the last 8760 hours.  ProBNP (last 3 results) No results for input(s): PROBNP in the last 8760 hours.  CBG:  Recent Labs Lab 10/28/16 1207 10/28/16 1636 10/28/16 2109 10/29/16 0732 10/29/16 1141  GLUCAP 125* 276* 215* 128* 140*    No results found for this or any previous visit (from the past 240 hour(s)).   Studies: No results found.  Scheduled Meds: . carvedilol  25 mg Oral BID WC  . DULoxetine  60 mg Oral QHS  . enoxaparin (LOVENOX) injection  40 mg  Subcutaneous Q24H  . insulin aspart  0-15 Units Subcutaneous TID WC  . insulin glargine  45 Units Subcutaneous QHS  . pantoprazole  40 mg Oral QAC breakfast  . risperiDONE  1 mg Oral QHS   Continuous Infusions: . sodium chloride 75 mL/hr (10/29/16 1248)    Principal Problem:   Hyperglycemia Active Problems:   Diabetes ([place])   Hyperlipidemia with target LDL less than 100   CKD (chronic kidney disease) stage 3, GFR 30-59 ml/min   Pancreatic cancer ([place])   DM (diabetes mellitus), type 2, uncontrolled ([place])   Confusion    Time spent: 35 minutes    Rishard Delange, [facility] Pager [phone]. If 7PM-7AM, please contact night-coverage at [email or web address], password [email or web address] 10/29/2016, 2:30 PM  LOS: 0 days

## 2016-10-29 NOTE — Progress Notes (Signed)
10/29/2016/Rhonda Davis,BSN,RN3,CCM:[phone]/Spoke with the family-daughters and husband/pt will not go to a snf and they are willing to watch the patient 24/7.  Dr. Tyrell Antonio made aware of this.

## 2016-10-29 NOTE — Progress Notes (Signed)
IP PROGRESS NOTE  Subjective:  Patricia Austin is known to me with a recent diagnosis of pancreas cancer. She was admitted 10/27/2016 secondary to confusion and marked hyperglycemia. She reports no pain at present. The pruritus has resolved. Her husband is at the bedside. He reports she was not taking her diabetic medication prior to hospital admission.  Objective: Vital signs in last 24 hours: Blood pressure (!) 110/36, pulse 75, temperature 98.4 F (36.9 C), temperature source Oral, resp. rate 18, height 5\' 3"  (1.6 m), weight 181 lb (82.1 kg), SpO2 100 %.  Intake/Output from previous day: 05/08 0701 - 05/09 0700 In: 923.8 [P.O.:170; I.V.:753.8] Out: -   Physical Exam:  HEENT: Sclera anicteric Abdomen: Mild tenderness at the right upper abdomen, no mass, no hepatosplenomegaly Extremities: No leg edema Neurologic: Alert, follows commands, the motor exam appears intact in the upper and lower extremities. Not oriented to place, year, or month.    Lab Results:  Recent Labs  10/27/16 1120 10/28/16 0654  WBC 7.8 6.4  HGB 9.2* 8.1*  HCT 29.2* 25.2*  PLT 533* 423*    BMET  Recent Labs  10/28/16 0654 10/29/16 0549  NA 131* 132*  K 5.1 4.6  CL 97* 100*  CO2 25 26  GLUCOSE 248* 157*  BUN 15 19  CREATININE 1.00 1.29*  CALCIUM 8.9 8.8*    Studies/Results: Ct Head W & Wo Contrast  Result Date: 10/27/2016 CLINICAL DATA:  71 year old female with increasing confusion. Pancreatic cancer. EXAM: CT HEAD WITHOUT AND WITH CONTRAST TECHNIQUE: Contiguous axial images were obtained from the base of the skull through the vertex without and with intravenous contrast CONTRAST:  47mL ISOVUE-300 IOPAMIDOL (ISOVUE-300) INJECTION 61% COMPARISON:  09/29/2016 head CT without contrast and earlier. FINDINGS: Brain: Stable cerebral volume. No midline shift, ventriculomegaly, mass effect, evidence of mass lesion, intracranial hemorrhage or evidence of cortically based acute infarction. Gray-white matter  differentiation is within normal limits throughout the brain. No abnormal enhancement identified. No encephalomalacia identified. Vascular: Calcified atherosclerosis at the skull base. Major intracranial vascular structures appear to be enhancing. Skull: Negative.  No acute osseous abnormality identified. Sinuses/Orbits: Visualized paranasal sinuses and mastoids are stable and well pneumatized. Other: Negative orbit and scalp soft tissues. IMPRESSION: Normal for age CT appearance of the brain. No metastatic disease or acute intracranial abnormality. Electronically Signed   By: Genevie Ann M.D.   On: 10/27/2016 13:44   Ct Chest W Contrast  Result Date: 10/27/2016 CLINICAL DATA:  Pancreatic cancer. Worsening confusion and abdominal pain. EXAM: CT CHEST, ABDOMEN, AND PELVIS WITH CONTRAST TECHNIQUE: Multidetector CT imaging of the chest, abdomen and pelvis was performed following the standard protocol during bolus administration of intravenous contrast. CONTRAST:  51mL ISOVUE-300 IOPAMIDOL (ISOVUE-300) INJECTION 61% COMPARISON:  Multiple exams, including 10/07/2016 CT scan FINDINGS: CT CHEST FINDINGS Cardiovascular: Mild cardiomegaly. Interventricular septal thickness 1.7 cm compatible with mild left ventricular hypertrophy. Mediastinum/Nodes: Scattered small axillary and mediastinal lymph nodes are not pathologically enlarged. Calcified left infrahilar lymph node compatible with old granulomatous disease. Wall thickening in the distal esophagus. Lungs/Pleura: Subtle nodularity along the right minor fissure likely attributable to subpleural lymph nodes. Mild reticulonodular opacities in both lungs, especially in dependent portions. A 5 mm right lower lobe nodule is present on image 101/6, no change from 09/20/16. A calcified granuloma in the left lower lobe measures 7 mm in diameter on image 99/6. Musculoskeletal: There is a notable central disc protrusion at T12-L1. CT ABDOMEN PELVIS FINDINGS Hepatobiliary: Pneumobilia.  Punctate calcifications in  the hepatic parenchyma from old granulomatous disease. Mild intrahepatic biliary dilatation in segment 6. 2.9 cm laminated gallstone in the gallbladder. Expandable stent in the common bile duct traversing the pancreatic head neck extending to the duodenum. Pancreas: Indistinctly marginated/infiltrative mass involving the pancreatic head and uncinate process, 4.9 by 4.1 cm on image 67/3, no fat plane separating the mass from the superior mesenteric artery posteriorly, some tributaries of the SMA run adjacent to the mass as well. The dorsal pancreatic duct is not overtly dilated at this time. Poor definition of fat planes around the common bile duct. Spleen: Old granulomatous disease Adrenals/Urinary Tract: Several hypodense renal lesions are present, including a 9 mm left kidney lesion on image 20/4 and a 8 mm lesion on image 19/4 in the right kidney, among others, this lesions are technically nonspecific although statistically likely to be benign lesions such as cysts. Adrenal glands unremarkable. Stomach/Bowel: The transverse duodenum is inseparable from the pancreatic mass. Sigmoid colon diverticulosis. Vascular/Lymphatic: No current portal vein or splenic vein thrombosis. Aortoiliac atherosclerotic vascular disease. Reproductive: Uterus absent.  Adnexa unremarkable. Other: No supplemental non-categorized findings. Musculoskeletal: Small umbilical hernia contains adipose tissue. Posterolateral rod and pedicle screw fixation and L3-L4-L5 IMPRESSION: 1. Enlarging pancreatic head mass, abutting the posterior margin of the superior mesenteric artery and tributaries of the superior mesenteric vein. No specific findings of distant metastatic disease are currently identified. Prior percutaneous stent/drain has been replaced with an expandable stent which traverses the mass, there is some borderline intrahepatic biliary dilatation in the right hepatic lobe but this is minimal. 2. Large  gallstone. 3. Old granulomatous disease. 4. Mild cardiomegaly and left ventricular hypertrophy. 5. Wall thickening in the distal esophagus may reflect esophagitis. 6. Stable 5 mm right lower lobe nodule with some stable faint reticulonodular opacities in both lungs. Electronically Signed   By: Van Clines M.D.   On: 10/27/2016 14:00   Ct Abdomen Pelvis W Contrast  Result Date: 10/27/2016 CLINICAL DATA:  Pancreatic cancer. Worsening confusion and abdominal pain. EXAM: CT CHEST, ABDOMEN, AND PELVIS WITH CONTRAST TECHNIQUE: Multidetector CT imaging of the chest, abdomen and pelvis was performed following the standard protocol during bolus administration of intravenous contrast. CONTRAST:  8mL ISOVUE-300 IOPAMIDOL (ISOVUE-300) INJECTION 61% COMPARISON:  Multiple exams, including 10/07/2016 CT scan FINDINGS: CT CHEST FINDINGS Cardiovascular: Mild cardiomegaly. Interventricular septal thickness 1.7 cm compatible with mild left ventricular hypertrophy. Mediastinum/Nodes: Scattered small axillary and mediastinal lymph nodes are not pathologically enlarged. Calcified left infrahilar lymph node compatible with old granulomatous disease. Wall thickening in the distal esophagus. Lungs/Pleura: Subtle nodularity along the right minor fissure likely attributable to subpleural lymph nodes. Mild reticulonodular opacities in both lungs, especially in dependent portions. A 5 mm right lower lobe nodule is present on image 101/6, no change from 09/20/16. A calcified granuloma in the left lower lobe measures 7 mm in diameter on image 99/6. Musculoskeletal: There is a notable central disc protrusion at T12-L1. CT ABDOMEN PELVIS FINDINGS Hepatobiliary: Pneumobilia. Punctate calcifications in the hepatic parenchyma from old granulomatous disease. Mild intrahepatic biliary dilatation in segment 6. 2.9 cm laminated gallstone in the gallbladder. Expandable stent in the common bile duct traversing the pancreatic head neck extending to  the duodenum. Pancreas: Indistinctly marginated/infiltrative mass involving the pancreatic head and uncinate process, 4.9 by 4.1 cm on image 67/3, no fat plane separating the mass from the superior mesenteric artery posteriorly, some tributaries of the SMA run adjacent to the mass as well. The dorsal pancreatic duct is not overtly dilated  at this time. Poor definition of fat planes around the common bile duct. Spleen: Old granulomatous disease Adrenals/Urinary Tract: Several hypodense renal lesions are present, including a 9 mm left kidney lesion on image 20/4 and a 8 mm lesion on image 19/4 in the right kidney, among others, this lesions are technically nonspecific although statistically likely to be benign lesions such as cysts. Adrenal glands unremarkable. Stomach/Bowel: The transverse duodenum is inseparable from the pancreatic mass. Sigmoid colon diverticulosis. Vascular/Lymphatic: No current portal vein or splenic vein thrombosis. Aortoiliac atherosclerotic vascular disease. Reproductive: Uterus absent.  Adnexa unremarkable. Other: No supplemental non-categorized findings. Musculoskeletal: Small umbilical hernia contains adipose tissue. Posterolateral rod and pedicle screw fixation and L3-L4-L5 IMPRESSION: 1. Enlarging pancreatic head mass, abutting the posterior margin of the superior mesenteric artery and tributaries of the superior mesenteric vein. No specific findings of distant metastatic disease are currently identified. Prior percutaneous stent/drain has been replaced with an expandable stent which traverses the mass, there is some borderline intrahepatic biliary dilatation in the right hepatic lobe but this is minimal. 2. Large gallstone. 3. Old granulomatous disease. 4. Mild cardiomegaly and left ventricular hypertrophy. 5. Wall thickening in the distal esophagus may reflect esophagitis. 6. Stable 5 mm right lower lobe nodule with some stable faint reticulonodular opacities in both lungs.  Electronically Signed   By: Van Clines M.D.   On: 10/27/2016 14:00    Medications: I have reviewed the patient's current medications.  Assessment/Plan: 1. Adenocarcinoma the pancreas-pancreas head/uncinatemass noted on a CT abdomen/pelvis 09/20/2016, status post an EUS biopsy 10/09/2016 confirming adenocarcinoma  CT 09/20/2016-pancreas head/uncinatemass, possible contact with the superior mesenteric artery, biliary dilatation, small low lung nodules  CTs chest, abdomen, and pelvis 10/27/2016-enlarging pancreas head mass with vascular abutment, no evidence of metastatic disease  2. Biliary obstruction secondary to #1, status post placement of a wall stent and external biliary drain 10/09/2016  Biliary drain removed  10/20/2016  3. Diabetes-admitted with marked hyperglycemia 10/27/2016  4. History of squamous cell carcinoma of the scalp  5. Lumbar fusion surgery February 2018  6. 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 History of Abdominal/back pain secondary to pancreas cancer  8. Cholangitis 10/07/2016  9.  Confusion-etiology unclear  Patricia Austin has been diagnosed with locally advanced pancreas cancer. There is no clinical or CT evidence of distant metastatic disease to date. The biliary obstruction has been relieved with placement of a stent.  She is admitted with marked hyperglycemia and confusion. The etiology of the confusion is unclear. A brain CT 10/27/2016 was negative. She is scheduled for an MRI of the brain today. She has no focal neurologic symptoms. She may have dementia.  I will follow-up on the brain MRI and return for further discussion with Patricia Austin and her husband tomorrow. She will not be a candidate for chemotherapy unless her mental status improves.     LOS: 0 days   Betsy Coder, MD   10/29/2016, 12:00 PM

## 2016-10-29 NOTE — Evaluation (Signed)
Physical Therapy Evaluation Patient Details Name: Patricia Austin MRN: [id number] DOB: 03-Feb-1946 Today's Date: 10/29/2016   History of Present Illness  71 y.o. female with medical history significant of pancreatic adenocarcinoma diagnosed 2 months ago, with obstructive cholangitis status post drain placement which was later removed and now has an internal stent, diabetes, chronic kidney disease stage III, hypertension, s/p posterior lumbar interbody fusion L3-4 and L4-5 2 months ago and admitted with hyperglycemia and confusion  Clinical Impression  Pt admitted with above diagnosis. Pt currently with functional limitations due to the deficits listed below (see PT Problem List).  Pt will benefit from skilled PT to increase their independence and safety with mobility to allow discharge to the venue listed below.  Pt appears confused and has unsteady gait at this time.  Spouse reports he can provide 24/7 supervision, and they have RW at home.     Follow Up Recommendations Home health PT;Supervision/Assistance - 24 hour    Equipment Recommendations  None recommended by PT    Recommendations for Other Services       Precautions / Restrictions Precautions Precautions: Fall      Mobility  Bed Mobility Overal bed mobility: Needs Assistance Bed Mobility: Supine to Sit;Sit to Supine     Supine to sit: Supervision Sit to supine: Supervision   General bed mobility comments: supervision for safety  Transfers Overall transfer level: Needs assistance Equipment used: Rolling walker (2 wheeled) Transfers: Sit to/from Stand Sit to Stand: Min assist         General transfer comment: assist to steady with rise, verbal cues for hand placement  Ambulation/Gait Ambulation/Gait assistance: Min assist Ambulation Distance (Feet): 60 Feet Assistive device: Rolling walker (2 wheeled) Gait Pattern/deviations: Step-through pattern;Decreased stride length;Narrow base of support     General Gait  Details: verbal cues for safe use of RW, min assist for LOB x3, fatigues quickly  Stairs            Wheelchair Mobility    Modified Rankin (Stroke Patients Only)       Balance Overall balance assessment: Needs assistance         Standing balance support: Bilateral upper extremity supported Standing balance-Leahy Scale: Poor Standing balance comment: requires UE support                             Pertinent Vitals/Pain Pain Assessment: No/denies pain    Home Living Family/patient expects to be discharged to:: Private residence Living Arrangements: Spouse/significant other   Type of Home: House Home Access: Stairs to enter   [employer] of Steps: 3 Home Layout: One level Home Equipment: Environmental consultant - 2 wheels;Bedside commode      Prior Function     Gait / Transfers Assistance Needed: ambulatory           Hand Dominance        Extremity/Trunk Assessment        Lower Extremity Assessment Lower Extremity Assessment: Generalized weakness       Communication   Communication: No difficulties  Cognition Arousal/Alertness: Awake/alert   Overall Cognitive Status: Impaired/Different from baseline Area of Impairment: Safety/judgement                         Safety/Judgement: Decreased awareness of deficits;Decreased awareness of safety     General Comments: uncertain of baseline, spouse in room and assisting pt with answering questions when she would not  answer correctly, unable to find her room when ambulating in hallway      General Comments      Exercises     Assessment/Plan    PT Assessment Patient needs continued PT services  PT Problem List Decreased mobility;Decreased strength;Decreased balance;Decreased knowledge of use of DME;Decreased activity tolerance;Decreased safety awareness;Decreased cognition       PT Treatment Interventions DME instruction;Gait training;Therapeutic exercise;Therapeutic  activities;Functional mobility training;Patient/family education    PT Goals (Current goals can be found in the Care Plan section)  Acute Rehab PT Goals PT Goal Formulation: With patient/family Time For Goal Achievement: 11/05/16 Potential to Achieve Goals: Good    Frequency Min 3X/week   Barriers to discharge        Co-evaluation               AM-PAC PT "6 Clicks" Daily Activity  Outcome Measure Difficulty turning over in bed (including adjusting bedclothes, sheets and blankets)?: None Difficulty moving from lying on back to sitting on the side of the bed? : None Difficulty sitting down on and standing up from a chair with arms (e.g., wheelchair, bedside commode, etc,.)?: A Little Help needed moving to and from a bed to chair (including a wheelchair)?: A Little Help needed walking in hospital room?: A Little Help needed climbing 3-5 steps with a railing? : A Lot 6 Click Score: 19    End of Session Equipment Utilized During Treatment: Gait belt Activity Tolerance: Patient limited by fatigue Patient left: with call bell/phone within reach;in bed;with bed alarm set;with family/visitor present   PT Visit Diagnosis: Other abnormalities of gait and mobility (R26.89)    Time: 5449-2010 PT Time Calculation (min) (ACUTE ONLY): 11 min   Charges:   PT Evaluation $PT Eval Low Complexity: 1 Procedure     PT G Codes:   PT G-Codes **NOT FOR INPATIENT CLASS** Functional Assessment Tool Used: AM-PAC 6 Clicks Basic Mobility;Clinical judgement Functional Limitation: Mobility: Walking and moving around Mobility: Walking and Moving Around Current Status (O7121): At least 20 percent but less than 40 percent impaired, limited or restricted Mobility: Walking and Moving Around Goal Status [phone]): At least 1 percent but less than 20 percent impaired, limited or restricted   Carmelia Bake, PT, DPT 10/29/2016 Pager: [phone]   York Ram E 10/29/2016, 12:08 PM

## 2016-10-29 NOTE — Care Management Note (Signed)
Case Management Note  Patient Details  Name: Patricia Austin MRN: [id number] Date of Birth: [birth year]  Subjective/Objective:                    Action/Plan:   Expected Discharge Date:   (unknown)               Expected Discharge Plan:  Home/Self Care  In-House Referral:     Discharge planning Services  CM Consult  Post Acute Care Choice:    Choice offered to:     DME Arranged:    DME Agency:     HH Arranged:    [place] Agency:     Status of Service:  In process, will continue to follow  If discussed at Long Length of Stay Meetings, dates discussed:    Additional Comments:  Leeroy Cha, RN 10/29/2016, 9:05 AM

## 2016-10-30 LAB — BASIC METABOLIC PANEL
ANION GAP: 5 (ref 5–15)
BUN: 17 mg/dL (ref 6–20)
CALCIUM: 9 mg/dL (ref 8.9–10.3)
CO2: 27 mmol/L (ref 22–32)
Chloride: 107 mmol/L (ref 101–111)
Creatinine, Ser: 1.11 mg/dL — ABNORMAL HIGH (ref 0.44–1.00)
GFR calc Af Amer: 56 mL/min — ABNORMAL LOW (ref >60)
GFR, EST NON AFRICAN AMERICAN: 48 mL/min — AB (ref >60)
Glucose, Bld: 143 mg/dL — ABNORMAL HIGH (ref 65–99)
POTASSIUM: 4.8 mmol/L (ref 3.5–5.1)
SODIUM: 139 mmol/L (ref 135–145)

## 2016-10-30 LAB — GLUCOSE, CAPILLARY
GLUCOSE-CAPILLARY: 122 mg/dL — AB (ref 65–99)
GLUCOSE-CAPILLARY: 218 mg/dL — AB (ref 65–99)
Glucose-Capillary: 50 mg/dL — ABNORMAL LOW (ref 65–99)
Glucose-Capillary: 63 mg/dL — ABNORMAL LOW (ref 65–99)
Glucose-Capillary: 124 mg/dL — ABNORMAL HIGH (ref 65–99)
Glucose-Capillary: 293 mg/dL — ABNORMAL HIGH (ref 65–99)

## 2016-10-30 MED ORDER — RISPERIDONE 0.25 MG PO TABS
0.2500 mg | ORAL_TABLET | Freq: Two times a day (BID) | ORAL | Status: DC
  Administered 2016-10-30 – 2016-10-31 (×2): 0.25 mg via ORAL
  Filled 2016-10-30 (×2): qty 1

## 2016-10-30 MED ORDER — INSULIN GLARGINE 100 UNIT/ML ~~LOC~~ SOLN: 35.0000 [IU] | Freq: Every day | SUBCUTANEOUS | Status: DC

## 2016-10-30 MED ORDER — LACTULOSE 10 GM/15ML PO SOLN: 20.0000 g | Freq: Every day | ORAL | Status: DC | PRN

## 2016-10-30 MED ORDER — AMLODIPINE BESYLATE 5 MG PO TABS
5.0000 mg | ORAL_TABLET | Freq: Every day | ORAL | Status: DC
  Administered 2016-10-30 – 2016-10-31 (×2): 5 mg via ORAL
  Filled 2016-10-30 (×2): qty 1

## 2016-10-30 MED ORDER — DEXTROSE 50 % IV SOLN
1.0000 | Freq: Once | INTRAVENOUS | Status: AC
  Administered 2016-10-30: 25 mL via INTRAVENOUS
  Filled 2016-10-30: qty 50

## 2016-10-30 NOTE — Progress Notes (Signed)
Physical Therapy Treatment Patient Details Name: Patricia Austin MRN: [id number] DOB: Feb 19, 1946 Today's Date: 10/30/2016    History of Present Illness 71 y.o. female with medical history significant of pancreatic adenocarcinoma diagnosed 2 months ago, with obstructive cholangitis status post drain placement which was later removed and now has an internal stent, diabetes, chronic kidney disease stage III, hypertension, s/p posterior lumbar interbody fusion L3-4 and L4-5 2 months ago and admitted with hyperglycemia and confusion    PT Comments    Assisted OOB to amb in hallway.  Very unsteady/drunken gait with impaired cognition requiring repeat instruction to stay on task.  Assisted to bathroom then to recliner.  LOB twice during session with poor/delayed self correction reaction.  Pt will need 27/7 care at home  Follow Up Recommendations  Home health PT;Supervision/Assistance - 24 hour     Equipment Recommendations  None recommended by PT    Recommendations for Other Services       Precautions / Restrictions Precautions Precautions: Fall Precaution Comments: AMS Restrictions Weight Bearing Restrictions: No    Mobility  Bed Mobility Overal bed mobility: Needs Assistance Bed Mobility: Supine to Sit     Supine to sit: Supervision;Min guard     General bed mobility comments: increased time and repeat VC's to complete task  Transfers Overall transfer level: Needs assistance Equipment used: Rolling walker (2 wheeled);None Transfers: Sit to/from [employer] to Stand: Min assist;Mod assist Stand pivot transfers: Min assist;Mod assist       General transfer comment: 75% VC's on safety with turns and walker use plus environmental safety(IV line)  Ambulation/Gait Ambulation/Gait assistance: Min assist;Mod assist;Max assist Ambulation Distance (Feet): 58 Feet Assistive device: Rolling walker (2 wheeled) Gait Pattern/deviations: Step-to pattern;Step-through  pattern;Staggering left;Staggering right Gait velocity: decreased   General Gait Details: very unsteady drunken gait easily distracted and HIGH FALL RISK with poor/delayed self correction reaction.  LOB X 2 during session.   Stairs            Wheelchair Mobility    Modified Rankin (Stroke Patients Only)       Balance                                            Cognition Arousal/Alertness: Awake/alert   Overall Cognitive Status: Impaired/Different from baseline Area of Impairment: Safety/judgement                         Safety/Judgement: Decreased awareness of deficits;Decreased awareness of safety     General Comments: Pt AxO x 1 easily distracted, required repeat VC's to stay on task.        Exercises      General Comments        Pertinent Vitals/Pain Pain Assessment: No/denies pain    Home Living                      Prior Function            PT Goals (current goals can now be found in the care plan section) Progress towards PT goals: Progressing toward goals    Frequency    Min 3X/week      PT Plan Current plan remains appropriate    Co-evaluation              AM-PAC PT "6 Clicks" Daily  Activity  Outcome Measure  Difficulty turning over in bed (including adjusting bedclothes, sheets and blankets)?: None Difficulty moving from lying on back to sitting on the side of the bed? : None Difficulty sitting down on and standing up from a chair with arms (e.g., wheelchair, bedside commode, etc,.)?: A Little Help needed moving to and from a bed to chair (including a wheelchair)?: A Little Help needed walking in hospital room?: A Little Help needed climbing 3-5 steps with a railing? : A Lot 6 Click Score: 19    End of Session Equipment Utilized During Treatment: Gait belt Activity Tolerance: Patient tolerated treatment well Patient left: in chair;with call bell/phone within reach;with chair alarm  set;with family/visitor present Nurse Communication: Mobility status PT Visit Diagnosis: Other abnormalities of gait and mobility (R26.89)     Time: 1410-1430 PT Time Calculation (min) (ACUTE ONLY): 20 min  Charges:  $Gait Training: 8-22 mins                    G Codes:       Rica Koyanagi  PTA [facility]  Acute  Rehab Pager      [phone]

## 2016-10-30 NOTE — Progress Notes (Signed)
[facility] PROGRESS NOTE  Patricia Austin AQT:[id number] DOB: [birth year] DOA: 10/27/2016 PCP: Chevis Pretty, FNP  Assessment/Plan: Brief Narrative:  Patricia Austin a 71 y.o.femalewith medical history significant of pancreatic adenocarcinoma diagnosed 2 months ago, with obstructive cholangitis status post drain placement which was later removed and now has an internal stent, diabetes, chronic kidney disease stage III, hypertension, is being brought to the emergency room by patient's husband due to intermittent confusion and elevated blood sugars.     1-Hyperglycemia/   DM (diabetes mellitus), type 2, uncontrolled ([place]); Last HB-A1c at 10.  Continue with SSI.  Patient had hypoglycemic episode this morning. She is also more sleepy this morning. Will hold lantus. If she start to eat, will start low dose.   2-Confusion, Acute encephalopathy; Unclear etiology MRI to be done this afternoon.  Not significant elevation of ammonia level, started  on lactulose low dose. Change to PRN.  Started on risperidone. Patient lethargic this morning. I have discontinue risperidone. Psych is planning to change it to low dose.  Psych consulted for capacity\ and help with medications.  RPR, negative, B12 normal.  No evidence of infection   Hyponatremia; improving with IV fluids.   Mild AKI, on CKD stage III, hyperkalemia;  Hold ACE. IV fluids.   HTN; SBP soft, Continue with coreg, hold  Lotensin.  Resume Norvasc.   Pancreatic cancer;  Appreciate Dr Benay Spice evaluation.   Lung nodule. Stable on CT follow up outpatient.   Code Status: Full code.  Family Communication: Husband who was at bedside.  Disposition Plan: might need SNF. Psych consulted for evaluation of capacity    Consultants:  Psych  Dr Benay Spice.   Procedures: None  Antibiotics:  none  HPI/Subjective: Patient has been sleepy today. She would open her eyes, and says; Leave me alone. Denies pain.    Objective: Vitals:   10/29/16 2034 10/30/16 0514  BP: 135/64 (!) 165/72  Pulse: 86 78  Resp: 18 18  Temp: 98.4 F (36.9 C) 97.7 F (36.5 C)    Intake/Output Summary (Last 24 hours) at 10/30/16 1312 Last data filed at 10/30/16 0930  Gross per 24 hour  Intake              885 ml  Output                0 ml  Net              885 ml   Filed Weights   10/27/16 1109  Weight: 82.1 kg (181 lb)    Exam:   General:  Sleepy   Cardiovascular: S 1, S 2 RRR  Respiratory: CTA  Abdomen: BS present, soft, nt  Musculoskeletal: no edema  Neuro; sleepy, follows some command.   Data Reviewed: Basic Metabolic Panel:  Recent Labs Lab 10/27/16 1120 10/28/16 0654 10/29/16 0549 10/30/16 1127  NA 128* 131* 132* 139  K 5.3* 5.1 4.6 4.8  CL 93* 97* 100* 107  CO2 27 25 26 27   GLUCOSE 588* 248* 157* 143*  BUN 16 15 19 17   CREATININE 1.33* 1.00 1.29* 1.11*  CALCIUM 9.4 8.9 8.8* 9.0   Liver Function Tests:  Recent Labs Lab 10/27/16 1120 10/28/16 0654  AST 51* 39  ALT 33 26  ALKPHOS 590* 439*  BILITOT 1.7* 1.2  PROT 7.2 5.6*  ALBUMIN 2.8* 2.2*   No results for input(s): LIPASE, AMYLASE in the last 168 hours.  Recent Labs Lab 10/27/16 1236 10/28/16 1057  AMMONIA 29  39*   CBC:  Recent Labs Lab 10/27/16 1120 10/28/16 0654  WBC 7.8 6.4  HGB 9.2* 8.1*  HCT 29.2* 25.2*  MCV 98.3 95.8  PLT 533* 423*   Cardiac Enzymes: No results for input(s): CKTOTAL, CKMB, CKMBINDEX, TROPONINI in the last 168 hours. BNP (last 3 results) No results for input(s): BNP in the last 8760 hours.  ProBNP (last 3 results) No results for input(s): PROBNP in the last 8760 hours.  CBG:  Recent Labs Lab 10/29/16 2033 10/30/16 0754 10/30/16 0905 10/30/16 1036 10/30/16 1226  GLUCAP 153* 50* 63* 124* 122*    No results found for this or any previous visit (from the past 240 hour(s)).   Studies: Mr Jeri Cos RE Contrast  Result Date: 10/29/2016 CLINICAL DATA:  Pancreas  adenocarcinoma. Evaluate for possible brain metastases. EXAM: MRI HEAD WITHOUT AND WITH CONTRAST TECHNIQUE: Multiplanar, multiecho pulse sequences of the brain and surrounding structures were obtained without and with intravenous contrast. CONTRAST:  16mL MULTIHANCE GADOBENATE DIMEGLUMINE 529 MG/ML IV SOLN COMPARISON:  Head CT with and without contrast from 2 days prior FINDINGS: Brain: No swelling or enhancement to suggest metastatic disease. No acute or subacute infarct, hemorrhage, or hydrocephalus. Generalized cerebral volume loss and mild periventricular chronic microvascular ischemic change. Vascular: Normal flow voids Skull and upper cervical spine: No marrow lesion noted. Sinuses/Orbits: Mild opacification of bilateral mastoid air cells with negative nasopharynx. Other: Symmetric scalp scarring. Motion degraded study, especially FLAIR and T1 precontrast sequences. IMPRESSION: Motion degraded study, but postcontrast imaging is diagnostic quality and negative for metastatic disease. Electronically Signed   By: Monte Fantasia M.D.   On: 10/29/2016 15:19    Scheduled Meds: . carvedilol  25 mg Oral BID WC  . DULoxetine  60 mg Oral QHS  . enoxaparin (LOVENOX) injection  40 mg Subcutaneous Q24H  . insulin aspart  0-15 Units Subcutaneous TID WC  . lactulose  20 g Oral Daily  . pantoprazole  40 mg Oral QAC breakfast   Continuous Infusions: . sodium chloride 75 mL/hr (10/29/16 1248)    Principal Problem:   Hyperglycemia Active Problems:   Diabetes ([place])   Hyperlipidemia with target LDL less than 100   CKD (chronic kidney disease) stage 3, GFR 30-59 ml/min   Pancreatic cancer ([place])   DM (diabetes mellitus), type 2, uncontrolled ([place])   Confusion    Time spent: 35 minutes    Edwards Mckelvie, [facility] Pager [phone]. If 7PM-7AM, please contact night-coverage at [email or web address], password [facility] 10/30/2016, 1:12 PM  LOS: 1 day

## 2016-10-30 NOTE — Progress Notes (Signed)
Inpatient Diabetes Program Recommendations  AACE/ADA: New Consensus Statement on Inpatient Glycemic Control (2015)  Target Ranges:  Prepandial:   less than 140 mg/dL      Peak postprandial:   less than 180 mg/dL (1-2 hours)      Critically ill patients:  140 - 180 mg/dL   Lab Results  Component Value Date   GLUCAP 122 (H) 10/30/2016   HGBA1C 10.5 (H) 08/12/2016    Review of Glycemic Control  Hypoglycemia x 2 days. Still needs some basal insulin.  Inpatient Diabetes Program Recommendations:    Add Lantus 30 units QHS.  Will follow closely.  Thank you. Lorenda Peck, RD, LDN, CDE Inpatient Diabetes Coordinator [phone]

## 2016-10-30 NOTE — Consult Note (Signed)
[facility] Face-to-Face Psychiatry Consult   Reason for Consult:  Capacity and confusion Referring Physician:  Dr. Tyrell Antonio Patient Identification: Patricia Austin MRN:  [id number] Principal Diagnosis: Hyperglycemia Diagnosis:   Patient Active Problem List   Diagnosis Date Noted  . DM (diabetes mellitus), type 2, uncontrolled ([place]) [E11.65] 10/28/2016  . Confusion [R41.0] 10/28/2016  . Hyperglycemia [R73.9] 10/27/2016  . Pancreatic cancer ([place]) [C25.9]   . Hyponatremia [E87.1] 10/07/2016  . Abdominal pain [R10.9]   . Biliary obstruction [K83.1] 09/29/2016  . Multiple duodenal ulcers [K29.81]   . Lumbar adjacent segment disease with spondylolisthesis [M51.36, M43.16] 08/20/2016  . Peripheral edema [R60.9] 07/18/2016  . CKD (chronic kidney disease) stage 3, GFR 30-59 ml/min [N18.3] 09/21/2015  . Fibromyalgia [M79.7] 09/20/2015  . DJD (degenerative joint disease) of knee [M17.10] 12/14/2013  . Diabetes ([place]) [E11.9] 02/09/2013  . Morbid obesity ([place]) [E66.01] 02/09/2013  . Hypertension [I10] 02/09/2013  . Hyperlipidemia with target LDL less than 100 [E78.5] 02/09/2013  . Osteoarthritis of both feet [M19.071, M19.072] 02/09/2013  . Osteoarthritis of both knees [M17.0] 02/09/2013  . GERD (gastroesophageal reflux disease) [K21.9] 02/09/2013  . Allergic rhinitis [J30.9] 02/09/2013  . 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 female patient admitted with confusion and elevated blood sugar.  HPI:  Patricia Austin is a 71 y.o. female with medical history significant of pancreatic adenocarcinoma diagnosed 2 months ago, with obstructive cholangitis status post drain placement which was later removed and now has an internal stent, diabetes, chronic kidney disease stage III, hypertension, is being brought to the emergency room by patient's husband due to intermittent confusion and elevated blood sugars. Patient has  been sleeping without any distress during my visit and patient husband is at bedside. Patient reportedly confused and not following the instructions. Patient has been reported she has been more independent and does not let him to supervise his medications or give the medication to him. Patient was found significantly hyperglycemic on arrival and required management of hyperglycemia. Patient seems to be noncompliant with insulin even though she reports she has been compliant with insulin treatment. Based on the information available and review of charts patient does not seem to meet criteria for capacity to make her own medical decisions.  Past Psychiatric History: Patient has no history of her acute psychiatric hospitalization or outpatient medication management.  Risk to Self: Is patient at risk for suicide?: No Risk to Others:   Prior Inpatient Therapy:   Prior Outpatient Therapy:    Past Medical History:  Past Medical History:  Diagnosis Date  . Anxiety    "not all the time"   . Chronic lower back pain   . CKD (chronic kidney disease) stage 3, GFR 30-59 ml/min   . Complication of anesthesia 2002   difficulty /w intubation, with plastic surgery , because she was feeling so anxious. 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 .   Pt. reports that she  is very sensitive to med. & is hard  to wake up.  . Depression    "not all the time"   . Difficult intubation 2002   with head surgery 2002 has done ok with later intubations  . Dysrhythmia    told that she sometimes has extra beats. Does not see cardiologist, had a stress test many yrs. ago>10 yrs.   . Esophageal dysmotility    "getting worse recenly" (  12/15/2013)  . Fibromyalgia   . Hypertension   . Nephritis ~ 1955  . Osteoarthritis    knees   . Pancreatic cancer ([place])   . Pancreatic mass   . Renal insufficiency   . Seasonal allergies    "in the spring"  . Shortness of breath    walking long distances   . squamous cell  carcinoma scalp    squamous, on scalp, & has had several surgeries on scalp, for plastics repair of scalp   . Type II diabetes mellitus ([place])     Past Surgical History:  Procedure Laterality Date  . ABDOMINAL HYSTERECTOMY     complete  . APPENDECTOMY    . BACK SURGERY  07/2016   L 4 and L 5 fusion done [facility]   . CARPAL TUNNEL RELEASE Right   . CARPAL TUNNEL RELEASE Left   . COLONOSCOPY    . CYST EXCISION  1990's?   scalp  . ESOPHAGOGASTRODUODENOSCOPY N/A 09/26/2016   Procedure: ESOPHAGOGASTRODUODENOSCOPY (EGD);  Surgeon: Gatha Mayer, MD;  Location[facility] ENDOSCOPY;  Service: Endoscopy;  Laterality: N/A;  . EUS N/A 10/09/2016   Procedure: UPPER ENDOSCOPIC ULTRASOUND (EUS) LINEAR;  Surgeon: Milus Banister, MD;  Location: [facility] ENDOSCOPY;  Service: Endoscopy;  Laterality: N/A;  . IR BILIARY STENT(S) EXISTING ACCESS INC DILATION CATH EXCHANGE  10/09/2016  . IR CHOLANGIOGRAM EXISTING TUBE  10/15/2016  . IR INT EXT BILIARY DRAIN WITH CHOLANGIOGRAM  09/29/2016   2 gallstones  . IR REMOVAL BILIARY DRAIN  10/20/2016  . LUMBAR DISC SURGERY  ~ 2012 X 2  . SKIN GRAFT  2002   scalp; "went to [facility] to repair OR earlier in the yearr"  . SQUAMOUS CELL CARCINOMA EXCISION  2002   "off scalp; cancerous area removed and stapled; Dr. Georgia Lopes"  . TONSILLECTOMY  age 82  . TOTAL KNEE ARTHROPLASTY Left 12/14/2013   Procedure: LEFT TOTAL KNEE ARTHROPLASTY;  Surgeon: Ninetta Lights, MD;  Location: [facility];  Service: Orthopedics;  Laterality: Left;  . TOTAL KNEE ARTHROPLASTY Left 12/15/2013   Family History:  Family History  Problem Relation Age of Onset  . Early death Mother   . Thrombosis Father    Family Psychiatric  History: Noncontributory Social History:  History  Alcohol Use No     History  Drug Use No    Social History   Social History  . Marital status: Married    Spouse name: N/A  . Number of children: N/A  . Years of education: N/A   Social History Main Topics  . Smoking status: Never  Smoker  . Smokeless tobacco: Never Used  . Alcohol use No  . Drug use: No  . Sexual activity: No   Other Topics Concern  . None   Social History Narrative  . None   Additional Social History:    Allergies:  No Known Allergies  Labs:  Results for orders placed or performed during the hospital encounter of 10/27/16 (from the past 48 hour(s))  Glucose, capillary     Status: Abnormal   Collection Time: 10/28/16 12:07 PM  Result Value Ref Range   Glucose-Capillary 125 (H) 65 - 99 mg/dL  Glucose, capillary     Status: Abnormal   Collection Time: 10/28/16  4:36 PM  Result Value Ref Range   Glucose-Capillary 276 (H) 65 - 99 mg/dL  Glucose, capillary     Status: Abnormal   Collection Time: 10/28/16  9:09 PM  Result Value Ref Range  Glucose-Capillary 215 (H) 65 - 99 mg/dL  Basic metabolic panel     Status: Abnormal   Collection Time: 10/29/16  5:49 AM  Result Value Ref Range   Sodium 132 (L) 135 - 145 mmol/L   Potassium 4.6 3.5 - 5.1 mmol/L   Chloride 100 (L) 101 - 111 mmol/L   CO2 26 22 - 32 mmol/L   Glucose, Bld 157 (H) 65 - 99 mg/dL   BUN 19 6 - 20 mg/dL   Creatinine, Ser 8.22 (H) 0.44 - 1.00 mg/dL   Calcium 8.8 (L) 8.9 - 10.3 mg/dL   GFR calc non Af Amer 40 (L) >60 mL/min   GFR calc Af Amer 47 (L) >60 mL/min    Comment: (NOTE) The eGFR has been calculated using the CKD EPI equation. This calculation has not been validated in all clinical situations. eGFR's persistently <60 mL/min signify possible Chronic Kidney Disease.    Anion gap 6 5 - 15  Glucose, capillary     Status: Abnormal   Collection Time: 10/29/16  7:32 AM  Result Value Ref Range   Glucose-Capillary 128 (H) 65 - 99 mg/dL  Glucose, capillary     Status: Abnormal   Collection Time: 10/29/16 11:41 AM  Result Value Ref Range   Glucose-Capillary 140 (H) 65 - 99 mg/dL  Glucose, capillary     Status: Abnormal   Collection Time: 10/29/16  4:31 PM  Result Value Ref Range   Glucose-Capillary 213 (H) 65 - 99  mg/dL  Glucose, capillary     Status: Abnormal   Collection Time: 10/29/16  8:33 PM  Result Value Ref Range   Glucose-Capillary 153 (H) 65 - 99 mg/dL  Glucose, capillary     Status: Abnormal   Collection Time: 10/30/16  7:54 AM  Result Value Ref Range   Glucose-Capillary 50 (L) 65 - 99 mg/dL  Glucose, capillary     Status: Abnormal   Collection Time: 10/30/16  9:05 AM  Result Value Ref Range   Glucose-Capillary 63 (L) 65 - 99 mg/dL  Glucose, capillary     Status: Abnormal   Collection Time: 10/30/16 10:36 AM  Result Value Ref Range   Glucose-Capillary 124 (H) 65 - 99 mg/dL    Current Facility-Administered Medications  Medication Dose Route Frequency Provider Last Rate Last Dose  . 0.9 %  sodium chloride infusion   Intravenous Continuous Calvert Cantor, MD 75 mL/hr at 10/29/16 1248 75 mL/hr at 10/29/16 1248  . acetaminophen (TYLENOL) tablet 650 mg  650 mg Oral Q4H PRN Calvert Cantor, MD   650 mg at 10/28/16 2146  . carvedilol (COREG) tablet 25 mg  25 mg Oral BID WC Calvert Cantor, MD   25 mg at 10/30/16 3436  . dextrose 50 % solution 25 mL  25 mL Intravenous PRN Leatha Gilding, MD      . DULoxetine (CYMBALTA) DR capsule 60 mg  60 mg Oral QHS Calvert Cantor, MD   60 mg at 10/29/16 2136  . enoxaparin (LOVENOX) injection 40 mg  40 mg Subcutaneous Q24H Leatha Gilding, MD   40 mg at 10/29/16 1732  . HYDROcodone-acetaminophen (NORCO/VICODIN) 5-325 MG per tablet 1-2 tablet  1-2 tablet Oral Q6H PRN Schorr, Roma Kayser, NP      . insulin aspart (novoLOG) injection 0-15 Units  0-15 Units Subcutaneous TID WC Schorr, Roma Kayser, NP   3 Units at 10/29/16 2137  . lactulose (CHRONULAC) 10 GM/15ML solution 20 g  20 g Oral Daily Regalado, Belkys  A, MD   20 g at 10/30/16 0920  . pantoprazole (PROTONIX) EC tablet 40 mg  40 mg Oral QAC breakfast Caren Griffins, MD   40 mg at 10/30/16 0312    Musculoskeletal: Strength & Muscle Tone: decreased Gait & Station: unable to stand Patient leans:  N/A  Psychiatric Specialty Exam: Physical Exam  ROS unable to complete at this time   Blood pressure (!) 165/72, pulse 78, temperature 97.7 F (36.5 C), temperature source Oral, resp. rate 18, height '5\' 3"'$  (1.6 m), weight 82.1 kg (181 lb), SpO2 100 %.Body mass index is 32.06 kg/m.  General Appearance: Casual  Eye Contact:  NA  Speech:  NA  Volume:  Decreased  Mood:  NA  Affect:  NA  Thought Process:  NA  Orientation:  NA  Thought Content:  NA  Suicidal Thoughts:  NA  Homicidal Thoughts:  No  Memory:  NA  Judgement:  NA  Insight:  NA  Psychomotor Activity:  NA  Concentration:  TBD  Recall:  NA  Fund of Knowledge:  NA  Language:  NA  Akathisia:  NA  Handed:  Right  AIMS (if indicated):     Assets:  Others:  TBD  ADL's:  Intact  Cognition:  Impaired,  Moderate  Sleep:        Treatment Plan Summary: Daily contact with patient to assess and evaluate symptoms and progress in treatment and Medication management  Based on my evaluation and review of case with the patient has been patient does not meet criteria for capacity to make her own medical decisions as she cannot understand the treatment of diabetes.  Appreciate psychiatric consultation and follow up as clinically required Please contact [phone] or [phone] if needs further assistance   Disposition: Supportive therapy provided about ongoing stressors.  Ambrose Finland, MD 10/30/2016 11:16 AM

## 2016-10-30 NOTE — Progress Notes (Addendum)
[id number]/ZJIRCV Fletcher Rathbun,BSN,RN3,[phone]/informatio0n for hhc faxed to [facility] for consideration of services./0903/tcf-[facility] no services in this area/tct-Tim with [facility] can and will service the patient upon discharge.

## 2016-10-30 NOTE — Progress Notes (Signed)
IP PROGRESS NOTE  Subjective:  Patricia Austin appears lethargic this morning. Her husband is at the bedside. Her husband reports she had diarrhea last night.  Objective: Vital signs in last 24 hours: Blood pressure (!) 165/72, pulse 78, temperature 97.7 F (36.5 C), temperature source Oral, resp. rate 18, height 5\' 3"  (1.6 m), weight 181 lb (82.1 kg), SpO2 100 %.  Intake/Output from previous day: 05/09 0701 - 05/10 0700 In: 825 [I.V.:825] Out: -   Physical Exam:   Neurologic: Lethargic, arousable, follows commands, the motor exam appears intact in the upper and lower extremities. Not oriented to place or situation.    Lab Results:  Recent Labs  10/27/16 1120 10/28/16 0654  WBC 7.8 6.4  HGB 9.2* 8.1*  HCT 29.2* 25.2*  PLT 533* 423*    BMET  Recent Labs  10/28/16 0654 10/29/16 0549  NA 131* 132*  K 5.1 4.6  CL 97* 100*  CO2 25 26  GLUCOSE 248* 157*  BUN 15 19  CREATININE 1.00 1.29*  CALCIUM 8.9 8.8*    Studies/Results: Mr Patricia Austin VP Contrast  Result Date: 10/29/2016 CLINICAL DATA:  Pancreas adenocarcinoma. Evaluate for possible brain metastases. EXAM: MRI HEAD WITHOUT AND WITH CONTRAST TECHNIQUE: Multiplanar, multiecho pulse sequences of the brain and surrounding structures were obtained without and with intravenous contrast. CONTRAST:  59mL MULTIHANCE GADOBENATE DIMEGLUMINE 529 MG/ML IV SOLN COMPARISON:  Head CT with and without contrast from 2 days prior FINDINGS: Brain: No swelling or enhancement to suggest metastatic disease. No acute or subacute infarct, hemorrhage, or hydrocephalus. Generalized cerebral volume loss and mild periventricular chronic microvascular ischemic change. Vascular: Normal flow voids Skull and upper cervical spine: No marrow lesion noted. Sinuses/Orbits: Mild opacification of bilateral mastoid air cells with negative nasopharynx. Other: Symmetric scalp scarring. Motion degraded study, especially FLAIR and T1 precontrast sequences. IMPRESSION:  Motion degraded study, but postcontrast imaging is diagnostic quality and negative for metastatic disease. Electronically Signed   By: Monte Fantasia M.D.   On: 10/29/2016 15:19    Medications: I have reviewed the patient's current medications.  Assessment/Plan: 1. Adenocarcinoma the pancreas-pancreas head/uncinatemass noted on a CT abdomen/pelvis 09/20/2016, status post an EUS biopsy 10/09/2016 confirming adenocarcinoma  CT 09/20/2016-pancreas head/uncinatemass, possible contact with the superior mesenteric artery, biliary dilatation, small low lung nodules  CTs chest, abdomen, and pelvis 10/27/2016-enlarging pancreas head mass with vascular abutment, no evidence of metastatic disease  2. Biliary obstruction secondary to #1, status post placement of a wall stent and external biliary drain 10/09/2016  Biliary drain removed  10/20/2016  3. Diabetes-admitted with marked hyperglycemia 10/27/2016  4. History of squamous cell carcinoma of the scalp  5. Lumbar fusion surgery February 2018  6. 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 History of Abdominal/back pain secondary to pancreas cancer  8. Cholangitis 10/07/2016  9.  Confusion-etiology unclear, MRI brain negative for metastatic disease on 10/29/2016  Patricia Austin has been diagnosed with locally advanced pancreas cancer. There is no clinical or CT evidence of distant metastatic disease. She presented with biliary obstruction. This has resolved.  She now has severe alteration of her mental status. She may have dementia, but the decline in her mental status seems subacute. There is no evidence for metastatic disease involving the CNS, or an electrolyte abnormality to explain the confusion. I doubt hepatic encephalopathy is contributing. The hyperbilirubinemia has resolved. He reports she has been on Cymbalta chronically. Could the risperidone be contributing to the lethargy  and confusion?  I discussed the situation with her husband. It will be difficult for him to manage her at home. I think they will need to pursue SNF placement if the mental status does not improve. She is not a candidate for treatment of the pancreas cancer unless her mental status improves.  Recommendations: 1. Consider neurology evaluation for the altered mental status 2. Hold CNS acting medications as possible 3. Plans for treatment of the pancreas cancer will be placed on hold 4. Outpatient follow-up will be scheduled at the [facility]. Please call Oncology as needed   LOS: 1 day   Betsy Coder, MD   10/30/2016, 10:47 AM

## 2016-10-31 ENCOUNTER — Telehealth: Payer: Self-pay | Admitting: Nurse Practitioner

## 2016-10-31 LAB — GLUCOSE, CAPILLARY: GLUCOSE-CAPILLARY: 75 mg/dL (ref 65–99)

## 2016-10-31 MED ORDER — AMLODIPINE BESYLATE 5 MG PO TABS: 10.0000 mg | ORAL_TABLET | Freq: Every day | ORAL | 1 refills | Status: DC

## 2016-10-31 MED ORDER — INSULIN GLARGINE 300 UNIT/ML ~~LOC~~ SOPN: 15.0000 [IU] | PEN_INJECTOR | Freq: Every day | SUBCUTANEOUS | 5 refills | Status: DC

## 2016-10-31 MED ORDER — AMLODIPINE BESYLATE 5 MG PO TABS
5.0000 mg | ORAL_TABLET | Freq: Once | ORAL | Status: AC
  Administered 2016-10-31: 5 mg via ORAL
  Filled 2016-10-31: qty 1

## 2016-10-31 MED ORDER — LACTULOSE 10 GM/15ML PO SOLN: 20.0000 g | Freq: Every day | ORAL | 0 refills | Status: DC | PRN

## 2016-10-31 MED ORDER — RISPERIDONE 0.25 MG PO TABS: 0.2500 mg | ORAL_TABLET | Freq: Two times a day (BID) | ORAL | 0 refills | Status: DC

## 2016-10-31 MED ORDER — INSULIN GLARGINE 100 UNIT/ML ~~LOC~~ SOLN
15.0000 [IU] | Freq: Every day | SUBCUTANEOUS | Status: DC
  Administered 2016-10-31: 15 [IU] via SUBCUTANEOUS
  Filled 2016-10-31: qty 0.15

## 2016-10-31 NOTE — Discharge Summary (Signed)
Physician Discharge Summary  Patricia Austin:[id number] DOB: 06-27-1945 DOA: 10/27/2016  PCP: Chevis Pretty, FNP  Admit date: 10/27/2016 Discharge date: 10/31/2016  Admitted From: Home.  Disposition:  Home   Recommendations for Outpatient Follow-up:  1. Follow up with PCP in 1-2 weeks 2. Please obtain BMP/CBC in one week 3. Please follow up on the following pending results:  Home Health: yes.   Discharge Condition: Stable.  CODE STATUS: Full code.  Diet recommendation: Carb Modified   Brief/Interim Summary: Seanna Sisler Totinis a 71 y.o.femalewith medical history significant of pancreatic adenocarcinoma diagnosed 2 months ago, with obstructive cholangitis status post drain placement which was later removed and now has an internal stent, diabetes, chronic kidney disease stage III, hypertension, is being brought to the emergency room by patient's husband due to intermittent confusion and elevated blood sugars.    1-Hyperglycemia/ DM (diabetes mellitus), type 2, uncontrolled ([place]); Last HB-A1c at 10.  Continue with SSI.  Patient presented with hyperglycemia. She subsequently develops hypoglycemia. Her insulin was reduce to 15 units daily.   2-Confusion, Acute encephalopathy; Unclear etiology, suspect related to fluctuation of blood sugar, also patient was probably not taking her medications correctly and component of depression. Not significant elevation of ammonia level, started  on lactulose low dose. Change to PRN.  Patient was started on risperidone 1 mg daily. She became very lethargic with that dose. Risperdal was decrease to 0.25 mg BID. She is doing better this morning, less anxious, or aggressive , no paranoid. She was able to answer questions and tell me the medications she has been taking.  Psych consulted for capacity\ and help with medications. Patient lack capacity. Family wants for patient to go home.  RPR, negative, B12 normal.  No evidence of infection    Hyponatremia; resolved with IV fluids.   Mild AKI, on CKD stage III, hyperkalemia;  Hold ACE. IV fluids.  improved.   HTN; SBP soft, Continue with coreg, hold  Lotensin.  Resume Norvasc, increased dose to 10 mg.   Pancreatic cancer;  Appreciate Dr Benay Spice evaluation.   Lung nodule. Stable on CT follow up outpatient.    Discharge Diagnoses:  Principal Problem:   Hyperglycemia Active Problems:   Diabetes ([place])   Hyperlipidemia with target LDL less than 100   CKD (chronic kidney disease) stage 3, GFR 30-59 ml/min   Pancreatic cancer ([place])   DM (diabetes mellitus), type 2, uncontrolled ([place])   Confusion    Discharge Instructions  Discharge Instructions    Diet - low sodium heart healthy    Complete by:  As directed    Increase activity slowly    Complete by:  As directed      Allergies as of 10/31/2016   No Known Allergies     Medication List    STOP taking these medications   benazepril 40 MG tablet Commonly known as:  LOTENSIN   gabapentin 300 MG capsule Commonly known as:  NEURONTIN   glipiZIDE-metformin 5-500 MG tablet Commonly known as:  METAGLIP   hydrOXYzine 25 MG tablet Commonly known as:  ATARAX/VISTARIL     TAKE these medications   amLODipine 5 MG tablet Commonly known as:  NORVASC Take 2 tablets (10 mg total) by mouth daily. What changed:  how much to take   carvedilol 25 MG tablet Commonly known as:  COREG TAKE  (1)  TABLET TWICE A DAY WITH MEALS (BREAKFAST AND SUPPER) What changed:  how much to take  how to take this  when  to take this  additional instructions   DULoxetine 60 MG capsule Commonly known as:  CYMBALTA Take 1 capsule (60 mg total) by mouth 2 (two) times daily. What changed:  when to take this   HYDROcodone-acetaminophen 7.5-325 MG tablet Commonly known as:  NORCO Take 1-2 tablets by mouth every 6 (six) hours as needed for moderate pain.   Insulin Glargine 300 UNIT/ML Sopn Commonly known as:  TOUJEO  SOLOSTAR Inject 15 Units into the skin daily. What changed:  how much to take   lactulose 10 GM/15ML solution Commonly known as:  CHRONULAC Take 30 mLs (20 g total) by mouth daily as needed for mild constipation.   pantoprazole 40 MG tablet Commonly known as:  PROTONIX Take 1 tablet (40 mg total) by mouth daily before breakfast.   risperiDONE 0.25 MG tablet Commonly known as:  RISPERDAL Take 1 tablet (0.25 mg total) by mouth 2 (two) times daily.       No Known Allergies  Consultations:  Psych  Dr Benay Spice.    Procedures/Studies: Ct Head W & Wo Contrast  Result Date: 10/27/2016 CLINICAL DATA:  71 year old female with increasing confusion. Pancreatic cancer. EXAM: CT HEAD WITHOUT AND WITH CONTRAST TECHNIQUE: Contiguous axial images were obtained from the base of the skull through the vertex without and with intravenous contrast CONTRAST:  58mL ISOVUE-300 IOPAMIDOL (ISOVUE-300) INJECTION 61% COMPARISON:  09/29/2016 head CT without contrast and earlier. FINDINGS: Brain: Stable cerebral volume. No midline shift, ventriculomegaly, mass effect, evidence of mass lesion, intracranial hemorrhage or evidence of cortically based acute infarction. Gray-white matter differentiation is within normal limits throughout the brain. No abnormal enhancement identified. No encephalomalacia identified. Vascular: Calcified atherosclerosis at the skull base. Major intracranial vascular structures appear to be enhancing. Skull: Negative.  No acute osseous abnormality identified. Sinuses/Orbits: Visualized paranasal sinuses and mastoids are stable and well pneumatized. Other: Negative orbit and scalp soft tissues. IMPRESSION: Normal for age CT appearance of the brain. No metastatic disease or acute intracranial abnormality. Electronically Signed   By: Genevie Ann M.D.   On: 10/27/2016 13:44   Ct Chest W Contrast  Result Date: 10/27/2016 CLINICAL DATA:  Pancreatic cancer. Worsening confusion and abdominal pain.  EXAM: CT CHEST, ABDOMEN, AND PELVIS WITH CONTRAST TECHNIQUE: Multidetector CT imaging of the chest, abdomen and pelvis was performed following the standard protocol during bolus administration of intravenous contrast. CONTRAST:  84mL ISOVUE-300 IOPAMIDOL (ISOVUE-300) INJECTION 61% COMPARISON:  Multiple exams, including 10/07/2016 CT scan FINDINGS: CT CHEST FINDINGS Cardiovascular: Mild cardiomegaly. Interventricular septal thickness 1.7 cm compatible with mild left ventricular hypertrophy. Mediastinum/Nodes: Scattered small axillary and mediastinal lymph nodes are not pathologically enlarged. Calcified left infrahilar lymph node compatible with old granulomatous disease. Wall thickening in the distal esophagus. Lungs/Pleura: Subtle nodularity along the right minor fissure likely attributable to subpleural lymph nodes. Mild reticulonodular opacities in both lungs, especially in dependent portions. A 5 mm right lower lobe nodule is present on image 101/6, no change from 09/20/16. A calcified granuloma in the left lower lobe measures 7 mm in diameter on image 99/6. Musculoskeletal: There is a notable central disc protrusion at T12-L1. CT ABDOMEN PELVIS FINDINGS Hepatobiliary: Pneumobilia. Punctate calcifications in the hepatic parenchyma from old granulomatous disease. Mild intrahepatic biliary dilatation in segment 6. 2.9 cm laminated gallstone in the gallbladder. Expandable stent in the common bile duct traversing the pancreatic head neck extending to the duodenum. Pancreas: Indistinctly marginated/infiltrative mass involving the pancreatic head and uncinate process, 4.9 by 4.1 cm on image 67/3, no fat  plane separating the mass from the superior mesenteric artery posteriorly, some tributaries of the SMA run adjacent to the mass as well. The dorsal pancreatic duct is not overtly dilated at this time. Poor definition of fat planes around the common bile duct. Spleen: Old granulomatous disease Adrenals/Urinary Tract:  Several hypodense renal lesions are present, including a 9 mm left kidney lesion on image 20/4 and a 8 mm lesion on image 19/4 in the right kidney, among others, this lesions are technically nonspecific although statistically likely to be benign lesions such as cysts. Adrenal glands unremarkable. Stomach/Bowel: The transverse duodenum is inseparable from the pancreatic mass. Sigmoid colon diverticulosis. Vascular/Lymphatic: No current portal vein or splenic vein thrombosis. Aortoiliac atherosclerotic vascular disease. Reproductive: Uterus absent.  Adnexa unremarkable. Other: No supplemental non-categorized findings. Musculoskeletal: Small umbilical hernia contains adipose tissue. Posterolateral rod and pedicle screw fixation and L3-L4-L5 IMPRESSION: 1. Enlarging pancreatic head mass, abutting the posterior margin of the superior mesenteric artery and tributaries of the superior mesenteric vein. No specific findings of distant metastatic disease are currently identified. Prior percutaneous stent/drain has been replaced with an expandable stent which traverses the mass, there is some borderline intrahepatic biliary dilatation in the right hepatic lobe but this is minimal. 2. Large gallstone. 3. Old granulomatous disease. 4. Mild cardiomegaly and left ventricular hypertrophy. 5. Wall thickening in the distal esophagus may reflect esophagitis. 6. Stable 5 mm right lower lobe nodule with some stable faint reticulonodular opacities in both lungs. Electronically Signed   By: Van Clines M.D.   On: 10/27/2016 14:00   Mr Jeri Cos FI Contrast  Result Date: 10/29/2016 CLINICAL DATA:  Pancreas adenocarcinoma. Evaluate for possible brain metastases. EXAM: MRI HEAD WITHOUT AND WITH CONTRAST TECHNIQUE: Multiplanar, multiecho pulse sequences of the brain and surrounding structures were obtained without and with intravenous contrast. CONTRAST:  64mL MULTIHANCE GADOBENATE DIMEGLUMINE 529 MG/ML IV SOLN COMPARISON:  Head CT  with and without contrast from 2 days prior FINDINGS: Brain: No swelling or enhancement to suggest metastatic disease. No acute or subacute infarct, hemorrhage, or hydrocephalus. Generalized cerebral volume loss and mild periventricular chronic microvascular ischemic change. Vascular: Normal flow voids Skull and upper cervical spine: No marrow lesion noted. Sinuses/Orbits: Mild opacification of bilateral mastoid air cells with negative nasopharynx. Other: Symmetric scalp scarring. Motion degraded study, especially FLAIR and T1 precontrast sequences. IMPRESSION: Motion degraded study, but postcontrast imaging is diagnostic quality and negative for metastatic disease. Electronically Signed   By: Monte Fantasia M.D.   On: 10/29/2016 15:19   Ct Abdomen Pelvis W Contrast  Result Date: 10/27/2016 CLINICAL DATA:  Pancreatic cancer. Worsening confusion and abdominal pain. EXAM: CT CHEST, ABDOMEN, AND PELVIS WITH CONTRAST TECHNIQUE: Multidetector CT imaging of the chest, abdomen and pelvis was performed following the standard protocol during bolus administration of intravenous contrast. CONTRAST:  68mL ISOVUE-300 IOPAMIDOL (ISOVUE-300) INJECTION 61% COMPARISON:  Multiple exams, including 10/07/2016 CT scan FINDINGS: CT CHEST FINDINGS Cardiovascular: Mild cardiomegaly. Interventricular septal thickness 1.7 cm compatible with mild left ventricular hypertrophy. Mediastinum/Nodes: Scattered small axillary and mediastinal lymph nodes are not pathologically enlarged. Calcified left infrahilar lymph node compatible with old granulomatous disease. Wall thickening in the distal esophagus. Lungs/Pleura: Subtle nodularity along the right minor fissure likely attributable to subpleural lymph nodes. Mild reticulonodular opacities in both lungs, especially in dependent portions. A 5 mm right lower lobe nodule is present on image 101/6, no change from 09/20/16. A calcified granuloma in the left lower lobe measures 7 mm in diameter on  image  99/6. Musculoskeletal: There is a notable central disc protrusion at T12-L1. CT ABDOMEN PELVIS FINDINGS Hepatobiliary: Pneumobilia. Punctate calcifications in the hepatic parenchyma from old granulomatous disease. Mild intrahepatic biliary dilatation in segment 6. 2.9 cm laminated gallstone in the gallbladder. Expandable stent in the common bile duct traversing the pancreatic head neck extending to the duodenum. Pancreas: Indistinctly marginated/infiltrative mass involving the pancreatic head and uncinate process, 4.9 by 4.1 cm on image 67/3, no fat plane separating the mass from the superior mesenteric artery posteriorly, some tributaries of the SMA run adjacent to the mass as well. The dorsal pancreatic duct is not overtly dilated at this time. Poor definition of fat planes around the common bile duct. Spleen: Old granulomatous disease Adrenals/Urinary Tract: Several hypodense renal lesions are present, including a 9 mm left kidney lesion on image 20/4 and a 8 mm lesion on image 19/4 in the right kidney, among others, this lesions are technically nonspecific although statistically likely to be benign lesions such as cysts. Adrenal glands unremarkable. Stomach/Bowel: The transverse duodenum is inseparable from the pancreatic mass. Sigmoid colon diverticulosis. Vascular/Lymphatic: No current portal vein or splenic vein thrombosis. Aortoiliac atherosclerotic vascular disease. Reproductive: Uterus absent.  Adnexa unremarkable. Other: No supplemental non-categorized findings. Musculoskeletal: Small umbilical hernia contains adipose tissue. Posterolateral rod and pedicle screw fixation and L3-L4-L5 IMPRESSION: 1. Enlarging pancreatic head mass, abutting the posterior margin of the superior mesenteric artery and tributaries of the superior mesenteric vein. No specific findings of distant metastatic disease are currently identified. Prior percutaneous stent/drain has been replaced with an expandable stent which  traverses the mass, there is some borderline intrahepatic biliary dilatation in the right hepatic lobe but this is minimal. 2. Large gallstone. 3. Old granulomatous disease. 4. Mild cardiomegaly and left ventricular hypertrophy. 5. Wall thickening in the distal esophagus may reflect esophagitis. 6. Stable 5 mm right lower lobe nodule with some stable faint reticulonodular opacities in both lungs. Electronically Signed   By: Van Clines M.D.   On: 10/27/2016 14:00   Ct Abdomen Pelvis W Contrast  Result Date: 10/07/2016 CLINICAL DATA:  Worsening generalized abdominal pain for 1 week. History of pancreatic mass post cholangiogram with biliary drainage placement. Draining green fluid. White cell count 17.6. Elevated lipase. History of hypertension and diabetes. EXAM: CT ABDOMEN AND PELVIS WITH CONTRAST TECHNIQUE: Multidetector CT imaging of the abdomen and pelvis was performed using the standard protocol following bolus administration of intravenous contrast. CONTRAST:  80 mL Isovue-300 COMPARISON:  09/20/2016 FINDINGS: Lower chest: Calcified granulomas in the left lung base. Nodule in the right lung base measuring 5 mm diameter, unchanged. Distal esophageal wall is thickened possibly indicating reflux or esophagitis. Hepatobiliary: Scattered calcified granulomas in the liver. Mild intrahepatic bile duct dilatation. Biliary drainage catheter is present, injuring through the left lobe of the liver. Distal catheter is demonstrated in the lower common bile duct. Cholelithiasis with a large stone in the gallbladder. Small amount of gas in the gallbladder is likely related to catheter insertion. Pancreas: There is a mass in the head and uncinate process of the pancreas measuring up to 4 x 4.2 cm diameter. Central necrosis is present. The mass is causing obstruction of the distal common bile duct. Mass is contiguous with the duodenum and erect duodenal invasion is not excluded. The mass abuts the superior  mesenteric artery and vein without circumferential involvement. No pancreatic ductal dilatation. Spleen: Calcified granulomas in the spleen. Adrenals/Urinary Tract: No adrenal gland nodules. Renal nephrograms are homogeneous and symmetrical. Small subcentimeter cysts.  No hydronephrosis or hydroureter. Bladder wall is not thickened. Stomach/Bowel: Stomach and small bowel are decompressed. Scattered gas and stool in the colon. No colonic distention or inflammation. Scattered colonic diverticula. Appendix is not identified. Vascular/Lymphatic: Aortic atherosclerosis. No enlarged abdominal or pelvic lymph nodes. Reproductive: Status post hysterectomy. No adnexal masses. Other: No free air or free fluid in the abdomen. Abdominal wall musculature appears intact. Musculoskeletal: Degenerative changes in the lumbar spine. Postoperative changes with laminectomies, posterior rod and screw fixation, and intervertebral disc prostheses from L3 through L5. IMPRESSION: Mass in the head and uncinate process of the pancreas is again demonstrated, with associated obstruction of the distal common bile duct. Biliary drainage catheter is in place with tip in the distal common bile duct. Mild residual intrahepatic bile duct dilatation. Cholelithiasis. Calcified granulomas. Right lower lung nodules measuring up to 5 mm without change. Esophageal wall thickening possibly due to reflux or esophagitis. Electronically Signed   By: Lucienne Capers M.D.   On: 10/07/2016 06:32   Ir Cholangiogram Existing Tube  Result Date: 10/15/2016 CLINICAL DATA:  Obstructing pancreatic carcinoma, status post percutaneous biliary drainage 09/29/2016, with CBD stent placement 10/09/2016, at which time an external drain catheter was left in place in the left bile duct above the confluence. Drain catheter has been left to external drainage. There has been little interval decrease in patient's bilirubin. Abdominal pain. EXAM: CHOLANGIOGRAM THROUGH EXISTING  CATHETER TECHNIQUE: The procedure, risks (including but not limited to bleeding, infection, organ damage ), benefits, and alternatives were explained to the patient. Questions regarding the procedure were encouraged and answered. The patient understands and consents to the procedure. Survey fluoroscopic inspection reveals stable position of the external biliary drain catheter, despite the fact that it was not secured to the StatLock device externally. The metallic CBD stent has dilated further since initial placement. Injection demonstrates decompressed central intrahepatic biliary tree. The CBD is patent into the duodenum with easy flow of contrast. Food duodenum is nondilated. IMPRESSION: 1. Widely patent internal metallic CBD stent with decompression of the biliary tree. We will start a trial of capping of the external biliary drain, and sent her home with an extra bag if it needs to be returned to external drainage. If she tolerates this, we could consider external biliary drain removal on Monday. Findings telephoned to Dr.  Hilarie Fredrickson, who concurs with this plan. Electronically Signed   By: Lucrezia Europe M.D.   On: 10/15/2016 12:42   Ir Removal Biliary Drain  Result Date: 10/20/2016 INDICATION: History of pancreatic carcinoma and common bile duct obstruction. Status post placement of covered metallic common bile duct stent on 10/09/2016 and additional cholangiogram with placement of drain in left lobe intrahepatic duct on 10/15/2016. EXAM: CHOLANGIOGRAM THROUGH PRE-EXISTING DRAINAGE CATHETER AND REMOVAL OF BILIARY DRAIN MEDICATIONS: None ANESTHESIA/SEDATION: None CONTRAST:  20 mL Isovue-300 FLUOROSCOPY TIME:  Fluoroscopy Time: 42 seconds.  (28 mGy). COMPLICATIONS: None immediate. PROCEDURE: Informed written consent was obtained from the patient after a thorough discussion of the procedural risks, benefits and alternatives. All questions were addressed. Maximal Sterile Barrier Technique was utilized including caps,  mask, sterile gowns, sterile gloves, sterile drape, hand hygiene and skin antiseptic. A timeout was performed prior to the initiation of the procedure. A cholangiogram was performed through a pre-existing capped left hepatic biliary drain. Multiple fluoroscopic spot images and loops were saved. Following the cholangiogram, the biliary drainage catheter was cut and removed in its entirety. FINDINGS: The cholangiogram demonstrates decompressed intrahepatic bile ducts and a normally patent  metallic biliary stent with excellent flow of contrast into the duodenum. The left hepatic biliary drain was removed without complication. IMPRESSION: Widely patent metallic common bile duct stent and decompressed intrahepatic bile ducts. The remaining capped left intrahepatic biliary drainage catheter was removed. Electronically Signed   By: Aletta Edouard M.D.   On: 10/20/2016 17:07   Ir Biliary Stent(s) Existing Access Inc Dilation Cath Exchange  Result Date: 10/09/2016 INDICATION: 71 year old female with 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 She currently has a left-sided internal/external biliary drainage catheter which has pulled back and may no longer be across the biliary occlusion. She presents for cholangiogram with possible biliary stent placement and possible biliary tube exchange. EXAM: 1. Cholangiogram through existing access 2. Placement of a metallic self expanding covered biliary stent 3. Exchange for a new 10 [place] external biliary drainage catheter MEDICATIONS: Patient is an inpatient already receiving intravenous antibiotics with 3.375 g Zosyn every 8 hours. No additional antibiotic prophylaxis was administered. ANESTHESIA/SEDATION: Due to a previous paradoxical reaction to Versed, 100 mcg fentanyl was administered intravenously for pain control during the procedure. Conscious sedation was not provided. FLUOROSCOPY TIME:  Fluoroscopy Time: 7 minutes 18 seconds (404.8 mGy).  COMPLICATIONS: None immediate. PROCEDURE: Informed written consent was obtained from the patient after a thorough discussion of the procedural risks, benefits and alternatives. All questions were addressed. Maximal Sterile Barrier Technique was utilized including caps, mask, sterile gowns, sterile gloves, sterile drape, hand hygiene and skin antiseptic. A timeout was performed prior to the initiation of the procedure. A hand injection of contrast material was performed through the existing biliary drainage catheter. The drainage catheter has pulled back and the tip remains within the occluded segments of the common bile duct. The tube was cut and removed over a Bentson wire. A 8 5 French angled catheter was advanced over the wire and positioned at the common bile duct occlusion. A hydrophilic roadrunner wire was advanced across the occlusion on the 5 French catheter advanced across the occlusion into the duodenum. A gentle hand injection of contrast material confirmed the presence of the catheter within the duodenum. The hydrophilic wire was exchanged for a superstiff Amplatz wire. The 5 French catheter was removed. A 9 French sheath was advanced over the wire and positioned in the common bile duct. A WallFlex 10 x 60 covered self expanding metal stent was then advanced across the lesion and deployed from the duodenum into the common bile duct. The stent was then post dilated using a 6 x 40 Mustang balloon. A gentle hand injection of contrast material through the sheath confirms antegrade flow throughout the stent and into the duodenum. There is rapid passage of contrast through the stent. The wire was brought back into the intrahepatic bile ducts and coiled at the intrahepatic biliary confluence. A new 10.2 all-purpose drainage catheter was then advanced over the wire and formed within the common bile duct. A final hand injection of contrast material and fluoroscopic saved image confirms the position of the stent  and the external biliary drain. The drain was secured with an 0 Prolene suture and connected to bag drainage. IMPRESSION: 1. Successful placement of a 10 x 60 covered metallic self expanding WallFlex stent across the common bile duct occlusion. 2. The internal/external biliary drainage catheter was removed and replaced with an external biliary drainage catheter positioned at the biliary confluence above the stent. PLAN: 1. Maintain biliary drain to gravity bag drainage until bilirubin has normalized. 2. Once bilirubin has trended downward significantly,  removed the bag and cap the tube. The tube should be capped prior to discharge. 3. Return Interventional Radiology in 2-4 weeks for clinical evaluation and cholangiogram prior to removal of the biliary drain. Signed, Criselda Peaches, MD [facility] Radiology Electronically Signed   By: Jacqulynn Cadet M.D.   On: 10/09/2016 17:40       Subjective: Alert and oriented. No distress.  Answering questions correctly. Husband at bedside, report patient is almost back to baseline.   Discharge Exam: Vitals:   10/31/16 0521 10/31/16 1010  BP: (!) 183/79 (!) 165/66  Pulse: 91 89  Resp: 18   Temp: 99 F (37.2 C)    Vitals:   10/30/16 1438 10/30/16 2136 10/31/16 0521 10/31/16 1010  BP: (!) 132/59 (!) 135/56 (!) 183/79 (!) 165/66  Pulse: 82 83 91 89  Resp: 20 19 18    Temp: 98.5 F (36.9 C) 98.8 F (37.1 C) 99 F (37.2 C)   TempSrc: Oral Oral Oral   SpO2: 100% 99% 99%   Weight:      Height:        General: Pt is alert, awake, not in acute distress Cardiovascular: RRR, S1/S2 +, no rubs, no gallops Respiratory: CTA bilaterally, no wheezing, no rhonchi Abdominal: Soft, NT, ND, bowel sounds + Extremities: no edema, no cyanosis    The results of significant diagnostics from this hospitalization (including imaging, microbiology, ancillary and laboratory) are listed below for reference.      Microbiology: No results found for this or any previous visit (from the past 240 hour(s)).   Labs: BNP (last 3 results) No results for input(s): BNP in the last 8760 hours. Basic Metabolic Panel:  Recent Labs Lab 10/27/16 1120 10/28/16 0654 10/29/16 0549 10/30/16 1127  NA 128* 131* 132* 139  K 5.3* 5.1 4.6 4.8  CL 93* 97* 100* 107  CO2 27 25 26 27   GLUCOSE 588* 248* 157* 143*  BUN 16 15 19 17   CREATININE 1.33* 1.00 1.29* 1.11*  CALCIUM 9.4 8.9 8.8* 9.0   Liver Function Tests:  Recent Labs Lab 10/27/16 1120 10/28/16 0654  AST 51* 39  ALT 33 26  ALKPHOS 590* 439*  BILITOT 1.7* 1.2  PROT 7.2 5.6*  ALBUMIN 2.8* 2.2*   No results for input(s): LIPASE, AMYLASE in the last 168 hours.  Recent Labs Lab 10/27/16 1236 10/28/16 1057  AMMONIA 29 39*   CBC:  Recent Labs Lab 10/27/16 1120 10/28/16 0654  WBC 7.8 6.4  HGB 9.2* 8.1*  HCT 29.2* 25.2*  MCV 98.3 95.8  PLT 533* 423*   Cardiac Enzymes: No results for input(s): CKTOTAL, CKMB, CKMBINDEX, TROPONINI in the last 168 hours. BNP: Invalid input(s): POCBNP CBG:  Recent Labs Lab 10/30/16 1036 10/30/16 1226 10/30/16 1803 10/30/16 2138 10/31/16 0733  GLUCAP 124* 122* 293* 218* 75   D-Dimer No results for input(s): DDIMER in the last 72 hours. Hgb A1c No results for input(s): HGBA1C in the last 72 hours. Lipid Profile No results for input(s): CHOL, HDL, LDLCALC, TRIG, CHOLHDL, LDLDIRECT in the last 72 hours. Thyroid function studies No results for input(s): TSH, T4TOTAL, T3FREE, THYROIDAB in the last 72 hours.  Invalid input(s): FREET3 Anemia work up  Recent Labs  10/28/16 [place] 2,578*   Urinalysis    Component Value Date/Time   COLORURINE YELLOW 10/27/2016 [place] 10/27/2016 1246   LABSPEC 1.020 10/27/2016 1246   PHURINE 7.0 10/27/2016 1246   GLUCOSEU >=500 (  A) 10/27/2016 1246   HGBUR NEGATIVE 10/27/2016 1246   [place] 10/27/2016 1246    BILIRUBINUR negative 04/06/2013 1723   [place] 10/27/2016 1246   PROTEINUR 100 (A) 10/27/2016 1246   UROBILINOGEN 1.0 12/06/2013 1244   NITRITE NEGATIVE 10/27/2016 1246   LEUKOCYTESUR NEGATIVE 10/27/2016 1246   Sepsis Labs Invalid input(s): PROCALCITONIN,  WBC,  LACTICIDVEN Microbiology No results found for this or any previous visit (from the past 240 hour(s)).   Time coordinating discharge: Over 30 minutes  SIGNED:   Elmarie Shiley, MD  [facility] 10/31/2016, 10:42 AM Pager   If 7PM-7AM, please contact night-coverage [email or web address] Password [email or web address]

## 2016-10-31 NOTE — Progress Notes (Signed)
RN reviewed discharge instructions with patient and husband. Patient educated about taking all medication as prescribed. All questions answered.   Paperwork and prescriptions given to family.   NT rolled patient down with all belongings to family car.

## 2016-10-31 NOTE — Care Management Note (Signed)
Case Management Note  Patient Details  Name: LASONIA CASINO MRN: [id number] Date of Birth: [birth year]  Subjective/Objective:Asked by nurse to talk to patient about d/c plan. Went into rm to discuss d/c plan of HHC services-[facility] will provide-rep Octavia Bruckner will call or stop by to see patient-all agreed to plan. No further CM needs or orders.                    Action/Plan:d/c home w/HHC.   Expected Discharge Date:  10/31/16               Expected Discharge Plan:  [place]  In-House Referral:  Clinical Social Work  Discharge planning Services  CM Consult  Post Acute Care Choice:  Home Health Choice offered to:  Adult Children  DME Arranged:    DME Agency:     HH Arranged:  RN, PT, OT, Nurse's Aide, Social Work [employer] Agency:  [employer] (now [facility])  Status of Service:  Completed, signed off  If discussed at [employer], dates discussed:    Additional Comments:  Dessa Phi, RN 10/31/2016, 11:20 AM

## 2016-10-31 NOTE — Telephone Encounter (Signed)
Spoke with daughter- she will call me back Monday to see is can get appointment with clinicl pharmacist

## 2016-10-31 NOTE — Progress Notes (Signed)
CM called about Home Health orders from MD. Patient and family have questions and want to clarify everything before being discharged home.   Kath, CM stated she would be up to speak with family.

## 2016-10-31 NOTE — Progress Notes (Signed)
Inpatient Diabetes Program Recommendations  AACE/ADA: New Consensus Statement on Inpatient Glycemic Control (2015)  Target Ranges:  Prepandial:   less than 140 mg/dL      Peak postprandial:   less than 180 mg/dL (1-2 hours)      Critically ill patients:  140 - 180 mg/dL   Lab Results  Component Value Date   GLUCAP 75 10/31/2016   HGBA1C 10.5 (H) 08/12/2016    Review of Glycemic Control  Blood sugar near low side of normal this am with no basal insulin on 5/10.  Need updated HgbA1C to assess glycemic control prior to admission. Last one - 10.5% on 08/12/2016.  Inpatient Diabetes Program Recommendations:    HgbA1C - need update.  Will continue to follow.  Thank you. Lorenda Peck, RD, LDN, CDE Inpatient Diabetes Coordinator [phone]

## 2016-11-01 DIAGNOSIS — C250 Malignant neoplasm of head of pancreas: Secondary | ICD-10-CM | POA: Diagnosis not present

## 2016-11-01 DIAGNOSIS — Z981 Arthrodesis status: Secondary | ICD-10-CM | POA: Diagnosis not present

## 2016-11-01 DIAGNOSIS — E1142 Type 2 diabetes mellitus with diabetic polyneuropathy: Secondary | ICD-10-CM | POA: Diagnosis not present

## 2016-11-01 DIAGNOSIS — D631 Anemia in chronic kidney disease: Secondary | ICD-10-CM | POA: Diagnosis not present

## 2016-11-01 DIAGNOSIS — N183 Chronic kidney disease, stage 3 (moderate): Secondary | ICD-10-CM | POA: Diagnosis not present

## 2016-11-01 DIAGNOSIS — Z96652 Presence of left artificial knee joint: Secondary | ICD-10-CM | POA: Diagnosis not present

## 2016-11-01 DIAGNOSIS — E1165 Type 2 diabetes mellitus with hyperglycemia: Secondary | ICD-10-CM | POA: Diagnosis not present

## 2016-11-01 DIAGNOSIS — I129 Hypertensive chronic kidney disease with stage 1 through stage 4 chronic kidney disease, or unspecified chronic kidney disease: Secondary | ICD-10-CM | POA: Diagnosis not present

## 2016-11-01 DIAGNOSIS — M797 Fibromyalgia: Secondary | ICD-10-CM | POA: Diagnosis not present

## 2016-11-01 DIAGNOSIS — E785 Hyperlipidemia, unspecified: Secondary | ICD-10-CM | POA: Diagnosis not present

## 2016-11-04 ENCOUNTER — Encounter (HOSPITAL_COMMUNITY): Payer: Self-pay | Admitting: *Deleted

## 2016-11-04 ENCOUNTER — Telehealth: Payer: Self-pay | Admitting: *Deleted

## 2016-11-04 ENCOUNTER — Emergency Department (HOSPITAL_COMMUNITY)
Admission: EM | Admit: 2016-11-04 | Discharge: 2016-11-04 | Disposition: A | Discharge status: Home / Self Care | Payer: Medicare Other | Attending: Emergency Medicine | Admitting: Emergency Medicine

## 2016-11-04 DIAGNOSIS — Z96652 Presence of left artificial knee joint: Secondary | ICD-10-CM | POA: Diagnosis not present

## 2016-11-04 DIAGNOSIS — E1122 Type 2 diabetes mellitus with diabetic chronic kidney disease: Secondary | ICD-10-CM | POA: Insufficient documentation

## 2016-11-04 DIAGNOSIS — Z85828 Personal history of other malignant neoplasm of skin: Secondary | ICD-10-CM | POA: Insufficient documentation

## 2016-11-04 DIAGNOSIS — E785 Hyperlipidemia, unspecified: Secondary | ICD-10-CM | POA: Diagnosis not present

## 2016-11-04 DIAGNOSIS — I2510 Atherosclerotic heart disease of native coronary artery without angina pectoris: Secondary | ICD-10-CM | POA: Insufficient documentation

## 2016-11-04 DIAGNOSIS — D631 Anemia in chronic kidney disease: Secondary | ICD-10-CM | POA: Diagnosis not present

## 2016-11-04 DIAGNOSIS — Z794 Long term (current) use of insulin: Secondary | ICD-10-CM | POA: Insufficient documentation

## 2016-11-04 DIAGNOSIS — E1142 Type 2 diabetes mellitus with diabetic polyneuropathy: Secondary | ICD-10-CM | POA: Diagnosis not present

## 2016-11-04 DIAGNOSIS — I129 Hypertensive chronic kidney disease with stage 1 through stage 4 chronic kidney disease, or unspecified chronic kidney disease: Secondary | ICD-10-CM | POA: Diagnosis not present

## 2016-11-04 DIAGNOSIS — N183 Chronic kidney disease, stage 3 (moderate): Secondary | ICD-10-CM | POA: Insufficient documentation

## 2016-11-04 DIAGNOSIS — R739 Hyperglycemia, unspecified: Secondary | ICD-10-CM

## 2016-11-04 DIAGNOSIS — E1165 Type 2 diabetes mellitus with hyperglycemia: Secondary | ICD-10-CM | POA: Diagnosis not present

## 2016-11-04 DIAGNOSIS — M797 Fibromyalgia: Secondary | ICD-10-CM | POA: Diagnosis not present

## 2016-11-04 DIAGNOSIS — Z8507 Personal history of malignant neoplasm of pancreas: Secondary | ICD-10-CM | POA: Diagnosis not present

## 2016-11-04 DIAGNOSIS — C250 Malignant neoplasm of head of pancreas: Secondary | ICD-10-CM | POA: Diagnosis not present

## 2016-11-04 DIAGNOSIS — Z981 Arthrodesis status: Secondary | ICD-10-CM | POA: Diagnosis not present

## 2016-11-04 DIAGNOSIS — Z79899 Other long term (current) drug therapy: Secondary | ICD-10-CM | POA: Insufficient documentation

## 2016-11-04 LAB — BASIC METABOLIC PANEL
ANION GAP: 7 (ref 5–15)
BUN: 13 mg/dL (ref 6–20)
CALCIUM: 9.1 mg/dL (ref 8.9–10.3)
CO2: 28 mmol/L (ref 22–32)
Chloride: 94 mmol/L — ABNORMAL LOW (ref 101–111)
Creatinine, Ser: 1.1 mg/dL — ABNORMAL HIGH (ref 0.44–1.00)
GFR, EST AFRICAN AMERICAN: 57 mL/min — AB (ref >60)
GFR, EST NON AFRICAN AMERICAN: 49 mL/min — AB (ref >60)
Glucose, Bld: 546 mg/dL (ref 65–99)
Potassium: 5.1 mmol/L (ref 3.5–5.1)
Sodium: 129 mmol/L — ABNORMAL LOW (ref 135–145)

## 2016-11-04 LAB — URINALYSIS, ROUTINE W REFLEX MICROSCOPIC
BILIRUBIN URINE: NEGATIVE
Bacteria, UA: NONE SEEN
Glucose, UA: >=500 mg/dL — AB
HGB URINE DIPSTICK: NEGATIVE
Ketones, ur: NEGATIVE mg/dL
Leukocytes, UA: NEGATIVE
NITRITE: NEGATIVE
PROTEIN: 100 mg/dL — AB
SPECIFIC GRAVITY, URINE: 1.022 (ref 1.005–1.030)
pH: 6 (ref 5.0–8.0)

## 2016-11-04 LAB — BLOOD GAS, VENOUS
ACID-BASE EXCESS: 2.8 mmol/L — AB (ref 0.0–2.0)
BICARBONATE: 25.8 mmol/L (ref 20.0–28.0)
FIO2: 21
O2 Saturation: 59.9 %
PCO2 VEN: 56.1 mmHg (ref 44.0–60.0)
PH VEN: 7.324 (ref 7.250–7.430)
PO2 VEN: 37.1 mmHg (ref 32.0–45.0)

## 2016-11-04 LAB — CBG MONITORING, ED
GLUCOSE-CAPILLARY: 478 mg/dL — AB (ref 65–99)
GLUCOSE-CAPILLARY: 395 mg/dL — AB (ref 65–99)
Glucose-Capillary: 281 mg/dL — ABNORMAL HIGH (ref 65–99)

## 2016-11-04 LAB — CBC
HCT: 27.6 % — ABNORMAL LOW (ref 36.0–46.0)
HEMOGLOBIN: 8.9 g/dL — AB (ref 12.0–15.0)
MCH: 30.8 pg (ref 26.0–34.0)
MCHC: 32.2 g/dL (ref 30.0–36.0)
MCV: 95.5 fL (ref 78.0–100.0)
Platelets: 438 10*3/uL — ABNORMAL HIGH (ref 150–400)
RBC: 2.89 MIL/uL — AB (ref 3.87–5.11)
RDW: 13.5 % (ref 11.5–15.5)
WBC: 7.4 10*3/uL (ref 4.0–10.5)

## 2016-11-04 MED ORDER — INSULIN GLARGINE 300 UNIT/ML ~~LOC~~ SOPN: 30.0000 [IU] | PEN_INJECTOR | Freq: Every day | SUBCUTANEOUS | 5 refills | Status: DC

## 2016-11-04 MED ORDER — INSULIN ASPART 100 UNIT/ML ~~LOC~~ SOLN
10.0000 [IU] | Freq: Once | SUBCUTANEOUS | Status: AC
  Administered 2016-11-04: 10 [IU] via INTRAVENOUS

## 2016-11-04 MED ORDER — SODIUM CHLORIDE 0.9 % IV BOLUS (SEPSIS)
1000.0000 mL | Freq: Once | INTRAVENOUS | Status: AC
  Administered 2016-11-04: 1000 mL via INTRAVENOUS

## 2016-11-04 MED ORDER — INSULIN ASPART 100 UNIT/ML ~~LOC~~ SOLN
10.0000 [IU] | Freq: Once | SUBCUTANEOUS | Status: AC
  Administered 2016-11-04: 10 [IU] via INTRAVENOUS
  Filled 2016-11-04: qty 1

## 2016-11-04 NOTE — ED Notes (Signed)
Pt verbalized understanding of discharge instructions. Pt wheeled to car.

## 2016-11-04 NOTE — ED Triage Notes (Signed)
Pt was recently hospitalized for hyperglycemia and discharged on Friday.  Pt has been having increasing CBG at home since Sunday when it was around 290.  CBG was 320 on Monday and last night she forgot to take her insulin.  This am when home health came and checked her CBG is was over 600.  Pt is alert and oriented, has dry mouth and feels thirsty

## 2016-11-04 NOTE — Discharge Instructions (Signed)
As discussed, your evaluation today has been largely reassuring.  But, it is important that you monitor your condition carefully, and do not hesitate to return to the ED if you develop new, or concerning changes in your condition. ? ?Otherwise, please follow-up with your physician for appropriate ongoing care. ? ?

## 2016-11-04 NOTE — ED Notes (Signed)
CRITICAL VALUE ALERT  Critical value received:  Glucose 546  Date of notification:  11/04/2016  Time of notification:  1529  Critical value read back:  Yes  Nurse who received alert:  Cena Benton   MD notified (1st page):  Vanita Panda  Time of first page:  1530  Responding MD:  Vanita Panda  Time MD responded:  [phone]

## 2016-11-04 NOTE — ED Provider Notes (Signed)
[facility] DEPT Provider Note   CSN: [id number] Arrival date & time: 11/04/16  1347     History   Chief Complaint Chief Complaint  Patient presents with  . Hyperglycemia    HPI Patricia Austin is a 71 y.o. female.  HPI Patricia Austin a 71 y.o.femalewith medical history significant of pancreatic adenocarcinoma diagnosed 2 months ago, with obstructive cholangitis status post drain placement which was later removed and now has an internal stent, diabetes, chronic kidney disease stage III, hypertension,Now presents with concern of mild dizziness, but substantial hyperglycemia. Patient notes that in general she has been improving since recent hospitalization during which she had stenting of pancreatic duct. However, over the past day the patient has mild dizziness, and after not taking her insulin dose last night, and eating a substantial amount of carbohydrates, over the past few hours she developed mild dizziness, and noticed that her blood sugar was elevated. She denies other new changes, specifically denying pain, confusion, vomiting, diarrhea. She is currently by her husband. He notes that on discharge patient did have decrease of her insulin dosing.     Past Medical History:  Diagnosis Date  . Anxiety    "not all the time"   . Chronic lower back pain   . CKD (chronic kidney disease) stage 3, GFR 30-59 ml/min   . Complication of anesthesia 2002   difficulty /w intubation, with plastic surgery , because she was feeling so anxious. 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 .   Pt. reports that she  is very sensitive to med. & is hard  to wake up.  . Depression    "not all the time"   . Difficult intubation 2002   with head surgery 2002 has done ok with later intubations  . Dysrhythmia    told that she sometimes has extra beats. Does not see cardiologist, had a stress test many yrs. ago>10 yrs.   . Esophageal dysmotility    "getting worse recenly" (12/15/2013)  .  Fibromyalgia   . Hypertension   . Nephritis ~ 1955  . Osteoarthritis    knees   . Pancreatic cancer ([place])   . Pancreatic mass   . Renal insufficiency   . Seasonal allergies    "in the spring"  . Shortness of breath    walking long distances   . squamous cell carcinoma scalp    squamous, on scalp, & has had several surgeries on scalp, for plastics repair of scalp   . Type II diabetes mellitus [facility])     Patient Active Problem List   Diagnosis Date Noted  . DM (diabetes mellitus), type 2, uncontrolled ([place]) 10/28/2016  . Confusion 10/28/2016  . Hyperglycemia 10/27/2016  . Pancreatic cancer ([place])   . Hyponatremia 10/07/2016  . Abdominal pain   . Biliary obstruction 09/29/2016  . Multiple duodenal ulcers   . Lumbar adjacent segment disease with spondylolisthesis 08/20/2016  . Peripheral edema 07/18/2016  . CKD (chronic kidney disease) stage 3, GFR 30-59 ml/min 09/21/2015  . Fibromyalgia 09/20/2015  . DJD (degenerative joint disease) of knee 12/14/2013  . Diabetes ([place]) 02/09/2013  . Morbid obesity ([place]) 02/09/2013  . Hypertension 02/09/2013  . Hyperlipidemia with target LDL less than 100 02/09/2013  . Osteoarthritis of both feet 02/09/2013  . Osteoarthritis of both knees 02/09/2013  . GERD (gastroesophageal reflux disease) 02/09/2013  . Allergic rhinitis 02/09/2013  . 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  ABDOMINAL HYSTERECTOMY     complete  . APPENDECTOMY    . BACK SURGERY  07/2016   L 4 and L 5 fusion done [facility]   . CARPAL TUNNEL RELEASE Right   . CARPAL TUNNEL RELEASE Left   . COLONOSCOPY    . CYST EXCISION  1990's?   scalp  . ESOPHAGOGASTRODUODENOSCOPY N/A 09/26/2016   Procedure: ESOPHAGOGASTRODUODENOSCOPY (EGD);  Surgeon: Gatha Mayer, MD;  Location: [facility] ENDOSCOPY;  Service: Endoscopy;  Laterality: N/A;  . EUS N/A 10/09/2016   Procedure: UPPER ENDOSCOPIC ULTRASOUND (EUS) LINEAR;  Surgeon: Milus Banister, MD;  Location: [facility] ENDOSCOPY;  Service: Endoscopy;  Laterality: N/A;  . IR BILIARY STENT(S) EXISTING ACCESS INC DILATION CATH EXCHANGE  10/09/2016  . IR CHOLANGIOGRAM EXISTING TUBE  10/15/2016  . IR INT EXT BILIARY DRAIN WITH CHOLANGIOGRAM  09/29/2016   2 gallstones  . IR REMOVAL BILIARY DRAIN  10/20/2016  . LUMBAR DISC SURGERY  ~ 2012 X 2  . SKIN GRAFT  2002   scalp; "went to [facility] to repair OR earlier in the yearr"  . SQUAMOUS CELL CARCINOMA EXCISION  2002   "off scalp; cancerous area removed and stapled; Dr. Georgia Lopes"  . TONSILLECTOMY  age 60  . TOTAL KNEE ARTHROPLASTY Left 12/14/2013   Procedure: LEFT TOTAL KNEE ARTHROPLASTY;  Surgeon: Ninetta Lights, MD;  Location: [facility];  Service: Orthopedics;  Laterality: Left;  . TOTAL KNEE ARTHROPLASTY Left 12/15/2013    OB History    No data available       Home Medications    Prior to Admission medications   Medication Sig Start Date End Date Taking? Authorizing Provider  amLODipine (NORVASC) 5 MG tablet Take 2 tablets (10 mg total) by mouth daily. Patient taking differently: Take 5 mg by mouth 2 (two) times daily.  10/31/16  Yes Regalado, Belkys A, MD  carvedilol (COREG) 25 MG tablet TAKE  (1)  TABLET TWICE A DAY WITH MEALS (BREAKFAST AND SUPPER) Patient taking differently: Take 25 mg by mouth 2 (two) times daily with a meal.  07/18/16  Yes Hassell Done, Mary-Margaret, FNP  DULoxetine (CYMBALTA) 60 MG capsule Take 1 capsule (60 mg total) by mouth 2 (two) times daily. Patient taking differently: Take 60 mg by mouth daily.  07/18/16  Yes Hassell Done, Mary-Margaret, FNP  HYDROcodone-acetaminophen (NORCO) 7.5-325 MG tablet Take 1-2 tablets by mouth every 6 (six) hours as needed for moderate pain. 10/16/16  Yes Pyrtle, Lajuan Lines, MD  Insulin Glargine (TOUJEO SOLOSTAR) 300 UNIT/ML SOPN Inject 15 Units into the skin daily. 10/31/16  Yes Regalado, Belkys A, MD  lactulose (CHRONULAC) 10 GM/15ML solution Take 30 mLs (20 g total) by mouth daily as needed for mild  constipation. 10/31/16  Yes Regalado, Belkys A, MD  pantoprazole (PROTONIX) 40 MG tablet Take 1 tablet (40 mg total) by mouth daily before breakfast. 09/26/16  Yes Gatha Mayer, MD  risperiDONE (RISPERDAL) 0.25 MG tablet Take 1 tablet (0.25 mg total) by mouth 2 (two) times daily. 10/31/16  Yes Regalado, Cassie Freer, MD    Family History Family History  Problem Relation Age of Onset  . Early death Mother   . Thrombosis Father     Social History Social History  Substance Use Topics  . Smoking status: Never Smoker  . Smokeless tobacco: Never Used  . Alcohol use No     Allergies   Patient has no known allergies.   Review of Systems Review of Systems  Constitutional:  Per HPI, otherwise negative  HENT:       Per HPI, otherwise negative  Respiratory:       Per HPI, otherwise negative  Cardiovascular:       Per HPI, otherwise negative  Gastrointestinal: Negative for vomiting.  Endocrine:       Negative aside from HPI  Genitourinary:       Neg aside from HPI   Musculoskeletal:       Per HPI, otherwise negative  Skin: Negative.   Allergic/Immunologic: Negative for immunocompromised state.       No ongoing chemotherapy at  Neurological: Positive for dizziness. Negative for syncope.  Hematological:       Adenocarcinoma     Physical Exam Updated Vital Signs BP (!) 144/66   Pulse 84   Temp 98.1 F (36.7 C) (Oral)   Resp 14   SpO2 98%   Physical Exam  Constitutional: She is oriented to person, place, and time. She appears well-developed and well-nourished. No distress.  HENT:  Head: Normocephalic and atraumatic.  Eyes: Conjunctivae and EOM are normal.  Cardiovascular: Normal rate and regular rhythm.   Pulmonary/Chest: Effort normal and breath sounds normal. No stridor. No respiratory distress.  Abdominal: She exhibits no distension. There is no tenderness.  Musculoskeletal: She exhibits no edema.  Neurological: She is alert and oriented to person, place, and  time. No cranial nerve deficit.  Skin: Skin is warm and dry.  Psychiatric: She has a normal mood and affect.  Nursing note and vitals reviewed.    ED Treatments / Results  Labs (all labs ordered are listed, but only abnormal results are displayed) Labs Reviewed  BASIC METABOLIC PANEL - Abnormal; Notable for the following:       Result Value   Sodium 129 (*)    Chloride 94 (*)    Glucose, Bld 546 (*)    Creatinine, Ser 1.10 (*)    GFR calc non Af Amer 49 (*)    GFR calc Af Amer 57 (*)    All other components within normal limits  CBC - Abnormal; Notable for the following:    RBC 2.89 (*)    Hemoglobin 8.9 (*)    HCT 27.6 (*)    Platelets 438 (*)    All other components within normal limits  URINALYSIS, ROUTINE W REFLEX MICROSCOPIC - Abnormal; Notable for the following:    Glucose, UA >=500 (*)    Protein, ur 100 (*)    Squamous Epithelial / LPF 0-5 (*)    All other components within normal limits  BLOOD GAS, VENOUS - Abnormal; Notable for the following:    Acid-Base Excess 2.8 (*)    All other components within normal limits  CBG MONITORING, ED - Abnormal; Notable for the following:    Glucose-Capillary 478 (*)    All other components within normal limits  CBG MONITORING, ED - Abnormal; Notable for the following:    Glucose-Capillary 395 (*)    All other components within normal limits  CBG MONITORING, ED - Abnormal; Notable for the following:    Glucose-Capillary 281 (*)    All other components within normal limits    Chart review notable for multiple recent hospitalizations include discharge last week after substantial improvement following stenting of hepatobiliary system, with improvement of obstruction.  Initial labs notable for hyperglycemia, glucose greater than 500. Patient has received IV fluids, will receive IV insulin.  4:10 PM Patient family aware of all results. We discussed her glycemic, no  evidence for DKA. Patient continued to receive IV fluids, IV  insulin.  Procedures Procedures (including critical care time)  Medications Ordered in ED Medications  sodium chloride 0.9 % bolus 1,000 mL (0 mLs Intravenous Stopped 11/04/16 1838)  insulin aspart (novoLOG) injection 10 Units (10 Units Intravenous Given 11/04/16 1620)  sodium chloride 0.9 % bolus 1,000 mL (0 mLs Intravenous Stopped 11/04/16 1626)  insulin aspart (novoLOG) injection 10 Units (10 Units Intravenous Given 11/04/16 1730)     Initial Impression / Assessment and Plan / ED Course  I have reviewed the triage vital signs and the nursing notes.  Pertinent labs & imaging results that were available during my care of the patient were reviewed by me and considered in my medical decision making (see chart for details).  Update After 2 additional doses of insulin, continue IV fluids, the patient had appropriate decline in her blood glucose. Lengthy conversation about today's findings, and I offered admission for medication reconciliation, and appropriate discharge, versus discharge with close outpatient follow-up, referral to endocrinology and primary care. Given the patient's recent cancer diagnosis, she requested outpatient follow-up, and this is reasonable given the resolution of her hyperglycemia, no evidence for DKA or other acute new pathology. However, with consideration of her hyperglycemia, and recent changes to her medication regimen while hospitalized, we made a slight adjustment with increased long-acting insulin dosing, and discussed the importance of following up with her physicians to adjust this again as needed.   Final Clinical Impressions(s) / ED Diagnoses   Final diagnoses:  Hyperglycemia    New Prescriptions Discharge Medication List as of 11/04/2016  6:15 PM       Carmin Muskrat, MD 11/04/16 2121

## 2016-11-04 NOTE — Telephone Encounter (Signed)
Hospice nurse called to inform pt's BS is greater than 600 Per Shelah Lewandowsky pt needs to go to the ED Verbalizes understanding

## 2016-11-05 ENCOUNTER — Ambulatory Visit
Admission: RE | Admit: 2016-11-05 | Discharge: 2016-11-05 | Disposition: A | Discharge status: Home / Self Care | Payer: Medicare Other | Source: Ambulatory Visit | Attending: Radiology | Admitting: Radiology

## 2016-11-05 ENCOUNTER — Ambulatory Visit
Admission: RE | Admit: 2016-11-05 | Discharge: 2016-11-05 | Disposition: A | Discharge status: Home / Self Care | Payer: Medicare Other | Source: Ambulatory Visit | Attending: Internal Medicine | Admitting: Internal Medicine

## 2016-11-05 DIAGNOSIS — C259 Malignant neoplasm of pancreas, unspecified: Secondary | ICD-10-CM

## 2016-11-06 DIAGNOSIS — M797 Fibromyalgia: Secondary | ICD-10-CM | POA: Diagnosis not present

## 2016-11-06 DIAGNOSIS — E785 Hyperlipidemia, unspecified: Secondary | ICD-10-CM | POA: Diagnosis not present

## 2016-11-06 DIAGNOSIS — C250 Malignant neoplasm of head of pancreas: Secondary | ICD-10-CM | POA: Diagnosis not present

## 2016-11-06 DIAGNOSIS — I129 Hypertensive chronic kidney disease with stage 1 through stage 4 chronic kidney disease, or unspecified chronic kidney disease: Secondary | ICD-10-CM | POA: Diagnosis not present

## 2016-11-06 DIAGNOSIS — Z981 Arthrodesis status: Secondary | ICD-10-CM | POA: Diagnosis not present

## 2016-11-06 DIAGNOSIS — D631 Anemia in chronic kidney disease: Secondary | ICD-10-CM | POA: Diagnosis not present

## 2016-11-06 DIAGNOSIS — N183 Chronic kidney disease, stage 3 (moderate): Secondary | ICD-10-CM | POA: Diagnosis not present

## 2016-11-06 DIAGNOSIS — Z96652 Presence of left artificial knee joint: Secondary | ICD-10-CM | POA: Diagnosis not present

## 2016-11-06 DIAGNOSIS — E1142 Type 2 diabetes mellitus with diabetic polyneuropathy: Secondary | ICD-10-CM | POA: Diagnosis not present

## 2016-11-06 DIAGNOSIS — E1165 Type 2 diabetes mellitus with hyperglycemia: Secondary | ICD-10-CM | POA: Diagnosis not present

## 2016-11-07 ENCOUNTER — Telehealth: Payer: Self-pay | Admitting: Oncology

## 2016-11-07 ENCOUNTER — Ambulatory Visit (HOSPITAL_BASED_OUTPATIENT_CLINIC_OR_DEPARTMENT_OTHER): Payer: Medicare Other | Admitting: Oncology

## 2016-11-07 VITALS — BP 160/64 | HR 97 | Temp 98.8°F | Resp 18 | Ht 63.0 in | Wt 182.5 lb

## 2016-11-07 DIAGNOSIS — C258 Malignant neoplasm of overlapping sites of pancreas: Secondary | ICD-10-CM | POA: Diagnosis not present

## 2016-11-07 DIAGNOSIS — R109 Unspecified abdominal pain: Secondary | ICD-10-CM

## 2016-11-07 DIAGNOSIS — C250 Malignant neoplasm of head of pancreas: Secondary | ICD-10-CM

## 2016-11-07 DIAGNOSIS — I129 Hypertensive chronic kidney disease with stage 1 through stage 4 chronic kidney disease, or unspecified chronic kidney disease: Secondary | ICD-10-CM | POA: Diagnosis not present

## 2016-11-07 DIAGNOSIS — D649 Anemia, unspecified: Secondary | ICD-10-CM | POA: Diagnosis not present

## 2016-11-07 DIAGNOSIS — E1165 Type 2 diabetes mellitus with hyperglycemia: Secondary | ICD-10-CM | POA: Diagnosis not present

## 2016-11-07 DIAGNOSIS — Z96652 Presence of left artificial knee joint: Secondary | ICD-10-CM | POA: Diagnosis not present

## 2016-11-07 DIAGNOSIS — Z981 Arthrodesis status: Secondary | ICD-10-CM | POA: Diagnosis not present

## 2016-11-07 DIAGNOSIS — M797 Fibromyalgia: Secondary | ICD-10-CM | POA: Diagnosis not present

## 2016-11-07 DIAGNOSIS — R110 Nausea: Secondary | ICD-10-CM

## 2016-11-07 DIAGNOSIS — E1142 Type 2 diabetes mellitus with diabetic polyneuropathy: Secondary | ICD-10-CM | POA: Diagnosis not present

## 2016-11-07 DIAGNOSIS — D631 Anemia in chronic kidney disease: Secondary | ICD-10-CM | POA: Diagnosis not present

## 2016-11-07 DIAGNOSIS — N183 Chronic kidney disease, stage 3 (moderate): Secondary | ICD-10-CM | POA: Diagnosis not present

## 2016-11-07 DIAGNOSIS — E785 Hyperlipidemia, unspecified: Secondary | ICD-10-CM | POA: Diagnosis not present

## 2016-11-07 NOTE — Telephone Encounter (Signed)
Gave patient AVS and calender per 5/18 los. Left message for Tiffany in IR for port placement.

## 2016-11-07 NOTE — Progress Notes (Signed)
  [facility] OFFICE PROGRESS NOTE   Diagnosis: Pancreas cancer  INTERVAL HISTORY:   Patricia Austin was discharged on 10/31/2016 after an admission with hyperglycemia and altered mental status. The blood sugar remains elevated. She is scheduled for a follow-up appointment with her primary physician next week. She has a good appetite.  Her husband reports her mental status is much improved. She has mild nausea and abdominal pain.    Objective:  Vital signs in last 24 hours:  Blood pressure (!) 160/64, pulse 97, temperature 98.8 F (37.1 C), temperature source Oral, resp. rate 18, height 5\' 3"  (1.6 m), weight 182 lb 8 oz (82.8 kg), SpO2 98 %.    HEENT:  sclera anicteric Resp:  lungs clear bilaterally Cardio:  regular rate and rhythm GI:  no splenomegaly. The liver edge is palpable in the right mid and lateral upper abdomen. No tenderness. Vascular:  no leg edema Neuro: Alert and oriented    Medications: I have reviewed the patient's current medications.  Assessment/Plan: 1. Adenocarcinoma the pancreas-pancreas head/uncinatemass noted on a CT abdomen/pelvis 09/20/2016, status post an EUS biopsy 10/09/2016 confirming adenocarcinoma  CT 09/20/2016-pancreas head/uncinatemass, possible contact with the superior mesenteric artery, biliary dilatation, small low lung nodules  CTs chest, abdomen, and pelvis 10/27/2016-enlarging pancreas head mass with vascular abutment, no evidence of metastatic disease  2. Biliary obstruction secondary to #1, status post placement of a wall stent and external biliary drain 10/09/2016  Biliary drain removed 10/20/2016  3. Diabetes-admitted with marked hyperglycemia 10/27/2016  4. History of squamous cell carcinoma of the scalp  5. Lumbar fusion surgery February 2018  6. 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 History of Abdominal/back pain secondary to pancreas  cancer  8. Cholangitis 10/07/2016  9.  Confusion during hospital admission 10/27/2016-etiology unclear, MRI brain negative for metastatic disease on 10/29/2016, improved    Disposition:  Patricia Austin appears much improved compared to last saw her in the hospital last week. Her mental status appears much better. I encouraged her to continue working on blood sugar control.  She has been diagnosed with locally advanced pancreas cancer. She does not appear to be a candidate for surgical resection at present. She is not a candidate for FOLFIRINOX chemotherapy. I recommend a trial of gemcitabine/Abraxane.  We reviewed the potential toxicities of the gemcitabine/Abraxane regimen. She understands the chance for nausea, mucositis, alopecia, and hematologic toxicity. We discussed the allergic reaction and neuropathy associated with Abraxane. We discussed the fever, rash, and pneumonitis seen with gemcitabine. She agrees to proceed. She will be referred for a chemotherapy teaching class and Port-A-Cath placement.  The plan is to initiate gemcitabine/Abraxane on 11/21/2016.  30 minutes were spent with the patient today. The majority of the time was used for counseling and coordination of care.  Patricia Coder, MD  11/07/2016  5:38 PM

## 2016-11-07 NOTE — Progress Notes (Signed)
START OFF PATHWAY REGIMEN - Pancreatic   [id number]:Nab-Paclitaxel (Abraxane(R)) 125 mg/m2 D1, 8, 15 + Gemcitabine 1,000 mg/m2 D1, 8, 15 q28 Days:   A cycle is every 28 days:     Nab-paclitaxel (protein bound)      Gemcitabine   **Always confirm dose/schedule in your pharmacy ordering system**    Patient Characteristics: Adenocarcinoma, Borderline Resectable or High Risk, Potentially Resectable, Primary Neoadjuvant Therapy, PS = 0, 1 Histology: Adenocarcinoma Current evidence of distant metastases? No AJCC T Category: Staged < 8th Ed. AJCC N Category: Staged < 8th Ed. AJCC M Category: Staged < 8th Ed. AJCC 8 Stage Grouping: Staged < 8th Ed.  Intent of Therapy: Curative Intent, Discussed with Patient

## 2016-11-08 DIAGNOSIS — E1142 Type 2 diabetes mellitus with diabetic polyneuropathy: Secondary | ICD-10-CM | POA: Diagnosis not present

## 2016-11-08 DIAGNOSIS — I129 Hypertensive chronic kidney disease with stage 1 through stage 4 chronic kidney disease, or unspecified chronic kidney disease: Secondary | ICD-10-CM | POA: Diagnosis not present

## 2016-11-08 DIAGNOSIS — Z981 Arthrodesis status: Secondary | ICD-10-CM | POA: Diagnosis not present

## 2016-11-08 DIAGNOSIS — D631 Anemia in chronic kidney disease: Secondary | ICD-10-CM | POA: Diagnosis not present

## 2016-11-08 DIAGNOSIS — M797 Fibromyalgia: Secondary | ICD-10-CM | POA: Diagnosis not present

## 2016-11-08 DIAGNOSIS — C250 Malignant neoplasm of head of pancreas: Secondary | ICD-10-CM | POA: Diagnosis not present

## 2016-11-08 DIAGNOSIS — Z96652 Presence of left artificial knee joint: Secondary | ICD-10-CM | POA: Diagnosis not present

## 2016-11-08 DIAGNOSIS — N183 Chronic kidney disease, stage 3 (moderate): Secondary | ICD-10-CM | POA: Diagnosis not present

## 2016-11-08 DIAGNOSIS — E785 Hyperlipidemia, unspecified: Secondary | ICD-10-CM | POA: Diagnosis not present

## 2016-11-08 DIAGNOSIS — E1165 Type 2 diabetes mellitus with hyperglycemia: Secondary | ICD-10-CM | POA: Diagnosis not present

## 2016-11-10 ENCOUNTER — Ambulatory Visit (INDEPENDENT_AMBULATORY_CARE_PROVIDER_SITE_OTHER): Payer: Medicare Other | Admitting: Nurse Practitioner

## 2016-11-10 ENCOUNTER — Encounter: Payer: Self-pay | Admitting: Nurse Practitioner

## 2016-11-10 ENCOUNTER — Telehealth: Payer: Self-pay | Admitting: *Deleted

## 2016-11-10 VITALS — BP 85/65 | HR 80 | Temp 97.2°F | Ht 63.0 in | Wt 178.0 lb

## 2016-11-10 DIAGNOSIS — IMO0002 Reserved for concepts with insufficient information to code with codable children: Secondary | ICD-10-CM

## 2016-11-10 DIAGNOSIS — M797 Fibromyalgia: Secondary | ICD-10-CM | POA: Diagnosis not present

## 2016-11-10 DIAGNOSIS — N183 Chronic kidney disease, stage 3 (moderate): Secondary | ICD-10-CM | POA: Diagnosis not present

## 2016-11-10 DIAGNOSIS — E1142 Type 2 diabetes mellitus with diabetic polyneuropathy: Secondary | ICD-10-CM | POA: Diagnosis not present

## 2016-11-10 DIAGNOSIS — D631 Anemia in chronic kidney disease: Secondary | ICD-10-CM | POA: Diagnosis not present

## 2016-11-10 DIAGNOSIS — Z981 Arthrodesis status: Secondary | ICD-10-CM | POA: Diagnosis not present

## 2016-11-10 DIAGNOSIS — C250 Malignant neoplasm of head of pancreas: Secondary | ICD-10-CM | POA: Diagnosis not present

## 2016-11-10 DIAGNOSIS — I959 Hypotension, unspecified: Secondary | ICD-10-CM | POA: Diagnosis not present

## 2016-11-10 DIAGNOSIS — Z96652 Presence of left artificial knee joint: Secondary | ICD-10-CM | POA: Diagnosis not present

## 2016-11-10 DIAGNOSIS — E1165 Type 2 diabetes mellitus with hyperglycemia: Secondary | ICD-10-CM | POA: Diagnosis not present

## 2016-11-10 DIAGNOSIS — Z794 Long term (current) use of insulin: Secondary | ICD-10-CM

## 2016-11-10 DIAGNOSIS — E1169 Type 2 diabetes mellitus with other specified complication: Secondary | ICD-10-CM | POA: Diagnosis not present

## 2016-11-10 DIAGNOSIS — I129 Hypertensive chronic kidney disease with stage 1 through stage 4 chronic kidney disease, or unspecified chronic kidney disease: Secondary | ICD-10-CM | POA: Diagnosis not present

## 2016-11-10 DIAGNOSIS — E785 Hyperlipidemia, unspecified: Secondary | ICD-10-CM | POA: Diagnosis not present

## 2016-11-10 MED ORDER — ONDANSETRON HCL 8 MG PO TABS: 8.0000 mg | ORAL_TABLET | Freq: Three times a day (TID) | ORAL | 1 refills | Status: DC | PRN

## 2016-11-10 MED ORDER — LIDOCAINE-PRILOCAINE 2.5-2.5 % EX CREA: TOPICAL_CREAM | CUTANEOUS | 0 refills | Status: AC

## 2016-11-10 NOTE — Progress Notes (Signed)
   Subjective:    Patient ID: Patricia Austin, female    DOB: 02/11/1946, 71 y.o.   MRN: [id number]  HPI Patient comes s in today for hospital follow up- she has pancreatic cancer and has been in and out of hospital with elevated blood sugars. They stopped her metformin at some point and she is now only on toujeo 30u daily and her blood sugars have been over 500. Husband says she is watching her diet. Fasting blood sugar was 235 this morning. SHe has had some low blood sugars- down to 65. She feels weak and gets dizzy and has to seat down.  * she currently has a drainage tube in pancreas- has class tomorrow to learn about chemo.    Review of Systems  Constitutional: Positive for fatigue. Negative for appetite change, chills and fever.  Respiratory: Negative.   Cardiovascular: Negative.   Genitourinary: Negative.   Neurological: Positive for light-headedness.  Psychiatric/Behavioral: Negative.   All other systems reviewed and are negative.      Objective:   Physical Exam  Constitutional: She is oriented to person, place, and time. She appears well-developed and well-nourished. No distress.  Cardiovascular: Normal rate and regular rhythm.   Pulmonary/Chest: Effort normal and breath sounds normal.  Neurological: She is alert and oriented to person, place, and time.  Skin: Skin is warm. There is pallor.  Psychiatric: She has a normal mood and affect. Her behavior is normal. Judgment and thought content normal.    BP (!) 85/65   Pulse 80   Temp 97.2 F (36.2 C) (Oral)   Ht 5\' 3"  (1.6 m)   Wt 178 lb (80.7 kg)   BMI 31.53 kg/m      Assessment & Plan:   1. Hypotension, unspecified hypotension type Stop amlodipine Keep check of blood pressure at home  2. Uncontrolled type 2 diabetes mellitus with other specified complication, with long-term current use of insulin ([place]) Continue 30u of toujeo daily Watch diet RTO prn  Mary-Margaret Hassell Done, FNP   * Reviewed hospital records Eat  snack prior to bedtime- peanut butter crackers

## 2016-11-10 NOTE — Telephone Encounter (Signed)
Message from pt's husband requesting to reschedule appointments to next week. OK, per Dr. Benay Spice. Message to schedulers for 6/5 appts.

## 2016-11-11 ENCOUNTER — Telehealth: Payer: Self-pay

## 2016-11-11 ENCOUNTER — Encounter: Payer: Self-pay | Admitting: *Deleted

## 2016-11-11 ENCOUNTER — Other Ambulatory Visit: Payer: Medicare Other

## 2016-11-11 ENCOUNTER — Ambulatory Visit: Payer: Self-pay | Admitting: Nurse Practitioner

## 2016-11-11 DIAGNOSIS — I129 Hypertensive chronic kidney disease with stage 1 through stage 4 chronic kidney disease, or unspecified chronic kidney disease: Secondary | ICD-10-CM | POA: Diagnosis not present

## 2016-11-11 DIAGNOSIS — E1142 Type 2 diabetes mellitus with diabetic polyneuropathy: Secondary | ICD-10-CM | POA: Diagnosis not present

## 2016-11-11 DIAGNOSIS — M797 Fibromyalgia: Secondary | ICD-10-CM | POA: Diagnosis not present

## 2016-11-11 DIAGNOSIS — D631 Anemia in chronic kidney disease: Secondary | ICD-10-CM | POA: Diagnosis not present

## 2016-11-11 DIAGNOSIS — M4316 Spondylolisthesis, lumbar region: Secondary | ICD-10-CM | POA: Diagnosis not present

## 2016-11-11 DIAGNOSIS — Z981 Arthrodesis status: Secondary | ICD-10-CM | POA: Diagnosis not present

## 2016-11-11 DIAGNOSIS — Z96652 Presence of left artificial knee joint: Secondary | ICD-10-CM | POA: Diagnosis not present

## 2016-11-11 DIAGNOSIS — N183 Chronic kidney disease, stage 3 (moderate): Secondary | ICD-10-CM | POA: Diagnosis not present

## 2016-11-11 DIAGNOSIS — C250 Malignant neoplasm of head of pancreas: Secondary | ICD-10-CM | POA: Diagnosis not present

## 2016-11-11 DIAGNOSIS — E785 Hyperlipidemia, unspecified: Secondary | ICD-10-CM | POA: Diagnosis not present

## 2016-11-11 DIAGNOSIS — E1165 Type 2 diabetes mellitus with hyperglycemia: Secondary | ICD-10-CM | POA: Diagnosis not present

## 2016-11-11 DIAGNOSIS — M48062 Spinal stenosis, lumbar region with neurogenic claudication: Secondary | ICD-10-CM | POA: Diagnosis not present

## 2016-11-11 NOTE — Telephone Encounter (Signed)
Home health nurse was out to check on patient and says that her blood sugars have been running high in the evenings. High 200s and one day over 400. Nurse states that she has humalog at home but no order for it or a sliding scale. Per MMM patients husband states that BS has been very low at times and she is uneasy about making any changes. She would like patient to follow up with our clinical pharmacist here at the office and also check BS before breakfast, lunch, and dinner. If her number is over 200 then give 3 units of humalog. Advised to call the office if they need further assistance.

## 2016-11-12 ENCOUNTER — Other Ambulatory Visit: Payer: Self-pay | Admitting: Student

## 2016-11-12 ENCOUNTER — Other Ambulatory Visit: Payer: Self-pay | Admitting: Radiology

## 2016-11-12 ENCOUNTER — Other Ambulatory Visit: Payer: Self-pay | Admitting: Nurse Practitioner

## 2016-11-12 ENCOUNTER — Encounter: Payer: Self-pay | Admitting: Oncology

## 2016-11-12 DIAGNOSIS — E1142 Type 2 diabetes mellitus with diabetic polyneuropathy: Secondary | ICD-10-CM

## 2016-11-12 NOTE — Progress Notes (Signed)
Called pt to introduce myself as her Arboriculturist but she was asleep so I talked w/ her husband about copay assistance.  They would like to apply so I will complete the [employer] app and get her signature and income docs on 11/14/16, once received I will fax to the foundation for processing.  I will also see if pt will qualify for the [place].

## 2016-11-13 ENCOUNTER — Telehealth: Payer: Self-pay

## 2016-11-13 ENCOUNTER — Telehealth: Payer: Self-pay | Admitting: *Deleted

## 2016-11-13 DIAGNOSIS — C250 Malignant neoplasm of head of pancreas: Secondary | ICD-10-CM | POA: Diagnosis not present

## 2016-11-13 DIAGNOSIS — N183 Chronic kidney disease, stage 3 (moderate): Secondary | ICD-10-CM | POA: Diagnosis not present

## 2016-11-13 DIAGNOSIS — E1142 Type 2 diabetes mellitus with diabetic polyneuropathy: Secondary | ICD-10-CM | POA: Diagnosis not present

## 2016-11-13 DIAGNOSIS — Z96652 Presence of left artificial knee joint: Secondary | ICD-10-CM | POA: Diagnosis not present

## 2016-11-13 DIAGNOSIS — I129 Hypertensive chronic kidney disease with stage 1 through stage 4 chronic kidney disease, or unspecified chronic kidney disease: Secondary | ICD-10-CM | POA: Diagnosis not present

## 2016-11-13 DIAGNOSIS — D631 Anemia in chronic kidney disease: Secondary | ICD-10-CM | POA: Diagnosis not present

## 2016-11-13 DIAGNOSIS — M797 Fibromyalgia: Secondary | ICD-10-CM | POA: Diagnosis not present

## 2016-11-13 DIAGNOSIS — E1165 Type 2 diabetes mellitus with hyperglycemia: Secondary | ICD-10-CM | POA: Diagnosis not present

## 2016-11-13 DIAGNOSIS — Z981 Arthrodesis status: Secondary | ICD-10-CM | POA: Diagnosis not present

## 2016-11-13 DIAGNOSIS — E785 Hyperlipidemia, unspecified: Secondary | ICD-10-CM | POA: Diagnosis not present

## 2016-11-13 NOTE — Telephone Encounter (Signed)
Therapist from home health went out to work with patient and patient states that her BS was 421 and that she took her 3 units of insulin and it has only came down to 398. She advised that they would hold therapy today and a nurse was coming out around 4:45. Advised therapist to have nurse to check BS and to notify us.

## 2016-11-13 NOTE — Telephone Encounter (Signed)
Message received from patient's husband requesting to change patient's appts from 11/21/16 to 11/25/16.  Message sent to schedulers.

## 2016-11-13 NOTE — Progress Notes (Signed)
LOV Hassell Done 11-10-16 epic EKG 10-27-16 epic CT chest 10-27-16 epic

## 2016-11-13 NOTE — Telephone Encounter (Signed)
Spoke with home health nurse to see what BS reading was at this time. She states that it has came down to 268. Advised from here on out when she checks BS before each meal to give 5 units humalog if over 200. Nurse explained to patient and husband and both verbalized understanding.

## 2016-11-13 NOTE — Patient Instructions (Addendum)
Quenna Doepke Zalenski  11/13/2016   Your procedure is scheduled on: 11-19-16  Report to [place]  elevators to 3rd floor to  [place] [facility]  Call this number if you have problems the morning of surgery [phone]    Remember: ONLY 1 PERSON MAY GO WITH YOU TO SHORT STAY TO GET  READY MORNING OF [place].  Do not eat food or drink liquids :After Midnight.     Take these medicines the morning of surgery with A SIP OF WATER: carvedilol(coreg), duloxetine(cymbalta), pantoprazole(protonix), risperidone(risperdal), hydrocodone as needed                                You may not have any metal on your body including hair pins and              piercings  Do not wear jewelry, make-up, lotions, powders or perfumes, deodorant             Do not wear nail polish.  Do not shave  48 hours prior to surgery.        Do not bring valuables to the hospital. [place].  Contacts, dentures or bridgework may not be worn into surgery.      Patients discharged the day of surgery will not be allowed to drive home.  Name and phone number of your driver:  Special Instructions: N/A              Please read over the following fact sheets you were given: _____________________________________________________________________    How to Manage Your Diabetes Before and After Surgery  Why is it important to control my blood sugar before and after surgery? . Improving blood sugar levels before and after surgery helps healing and can limit problems. . A way of improving blood sugar control is eating a healthy diet by: o  Eating less sugar and carbohydrates o  Increasing activity/exercise o  Talking with your doctor about reaching your blood sugar goals . High blood sugars (greater than 180 mg/dL) can raise your risk of infections and slow your recovery, so you will need to focus on controlling your  diabetes during the weeks before surgery. . Make sure that the doctor who takes care of your diabetes knows about your planned surgery including the date and location.  How do I manage my blood sugar before surgery? . Check your blood sugar at least 4 times a day, starting 2 days before surgery, to make sure that the level is not too high or low. o Check your blood sugar the morning of your surgery when you wake up and every 2 hours until you get to the Short Stay unit. . If your blood sugar is less than 70 mg/dL, you will need to treat for low blood sugar: o Do not take insulin. o Treat a low blood sugar (less than 70 mg/dL) with  cup of clear juice (cranberry or apple), 4 glucose tablets, OR glucose gel. o Recheck blood sugar in 15 minutes after treatment (to make sure it is greater than 70 mg/dL). If your blood sugar is not greater than 70 mg/dL on recheck, call [phone] for further instructions. . Report your blood  sugar to the short stay nurse when you get to Short Stay.  . If you are admitted to the hospital after surgery: o Your blood sugar will be checked by the staff and you will probably be given insulin after surgery (instead of oral diabetes medicines) to make sure you have good blood sugar levels. o The goal for blood sugar control after surgery is 80-180 mg/dL.   WHAT DO I DO ABOUT MY DIABETES MEDICATION?    . THE DAY BEFORE SURGERY 11-18-16,  o take  15 units of Glargine (TUOJEO)  Insulin at bedtime o Take usual breakfast and lunch coverage dose of HUMALOG insulin. DO NOT TAKE ANY DINNER/BEDTIME dose.      THE MORNING OF SURGERY 11-19-16  If CBG greater than 220, take 1/2 of usual correction (meal coverage) dose   Patient Signature:  Date:   Nurse Signature:  Date:   Reviewed and Endorsed by [place] Patient Education Committee, August 2015   [facility] - Preparing for Surgery Before surgery, you can play an important role.  Because skin is not sterile, your  skin needs to be as free of germs as possible.  You can reduce the number of germs on your skin by washing with CHG (chlorahexidine gluconate) soap before surgery.  CHG is an antiseptic cleaner which kills germs and bonds with the skin to continue killing germs even after washing. Please DO NOT use if you have an allergy to CHG or antibacterial soaps.  If your skin becomes reddened/irritated stop using the CHG and inform your nurse when you arrive at Short Stay. Do not shave (including legs and underarms) for at least 48 hours prior to the first CHG shower.  You may shave your face/neck. Please follow these instructions carefully:  1.  Shower with CHG Soap the night before surgery and the  morning of Surgery.  2.  If you choose to wash your hair, wash your hair first as usual with your  normal  shampoo.  3.  After you shampoo, rinse your hair and body thoroughly to remove the  shampoo.                           4.  Use CHG as you would any other liquid soap.  You can apply chg directly  to the skin and wash                       Gently with a scrungie or clean washcloth.  5.  Apply the CHG Soap to your body ONLY FROM THE NECK DOWN.   Do not use on face/ open                           Wound or open sores. Avoid contact with eyes, ears mouth and genitals (private parts).                       Wash face,  Genitals (private parts) with your normal soap.             6.  Wash thoroughly, paying special attention to the area where your surgery  will be performed.  7.  Thoroughly rinse your body with warm water from the neck down.  8.  DO NOT shower/wash with your normal soap after using and rinsing off  the CHG Soap.  9.  Pat yourself dry with a clean towel.            10.  Wear clean pajamas.            11.  Place clean sheets on your bed the night of your first shower and do not  sleep with pets. Day of Surgery : Do not apply any lotions/deodorants the morning of surgery.  Please wear  clean clothes to the hospital/surgery center.  FAILURE TO FOLLOW THESE INSTRUCTIONS MAY RESULT IN THE CANCELLATION OF YOUR SURGERY PATIENT SIGNATURE_________________________________  NURSE SIGNATURE__________________________________  ________________________________________________________________________

## 2016-11-14 ENCOUNTER — Encounter (HOSPITAL_COMMUNITY): Payer: Self-pay

## 2016-11-14 ENCOUNTER — Encounter (HOSPITAL_COMMUNITY)
Admission: RE | Admit: 2016-11-14 | Discharge: 2016-11-14 | Disposition: A | Discharge status: Home / Self Care | Payer: Medicare Other | Source: Ambulatory Visit | Attending: Interventional Radiology | Admitting: Interventional Radiology

## 2016-11-14 DIAGNOSIS — C259 Malignant neoplasm of pancreas, unspecified: Secondary | ICD-10-CM | POA: Insufficient documentation

## 2016-11-14 DIAGNOSIS — Z01812 Encounter for preprocedural laboratory examination: Secondary | ICD-10-CM | POA: Diagnosis not present

## 2016-11-14 LAB — BASIC METABOLIC PANEL
ANION GAP: 7 (ref 5–15)
BUN: 20 mg/dL (ref 6–20)
CALCIUM: 9.2 mg/dL (ref 8.9–10.3)
CO2: 29 mmol/L (ref 22–32)
Chloride: 102 mmol/L (ref 101–111)
Creatinine, Ser: 1.35 mg/dL — ABNORMAL HIGH (ref 0.44–1.00)
GFR calc Af Amer: 44 mL/min — ABNORMAL LOW (ref >60)
GFR calc non Af Amer: 38 mL/min — ABNORMAL LOW (ref >60)
GLUCOSE: 231 mg/dL — AB (ref 65–99)
Potassium: 4.6 mmol/L (ref 3.5–5.1)
Sodium: 138 mmol/L (ref 135–145)

## 2016-11-14 LAB — APTT: APTT: 35 s (ref 24–36)

## 2016-11-14 LAB — GLUCOSE, CAPILLARY: GLUCOSE-CAPILLARY: 220 mg/dL — AB (ref 65–99)

## 2016-11-14 LAB — CBC
HEMATOCRIT: 28.1 % — AB (ref 36.0–46.0)
HEMOGLOBIN: 9 g/dL — AB (ref 12.0–15.0)
MCH: 29.8 pg (ref 26.0–34.0)
MCHC: 32 g/dL (ref 30.0–36.0)
MCV: 93 fL (ref 78.0–100.0)
Platelets: 356 10*3/uL (ref 150–400)
RBC: 3.02 MIL/uL — ABNORMAL LOW (ref 3.87–5.11)
RDW: 13.4 % (ref 11.5–15.5)
WBC: 9.6 10*3/uL (ref 4.0–10.5)

## 2016-11-14 LAB — PROTIME-INR
INR: 0.9
Prothrombin Time: 12.1 seconds (ref 11.4–15.2)

## 2016-11-15 LAB — HEMOGLOBIN A1C
Hgb A1c MFr Bld: 9.8 % — ABNORMAL HIGH (ref 4.8–5.6)
Mean Plasma Glucose: 235 mg/dL

## 2016-11-17 DIAGNOSIS — N183 Chronic kidney disease, stage 3 (moderate): Secondary | ICD-10-CM | POA: Diagnosis not present

## 2016-11-17 DIAGNOSIS — M797 Fibromyalgia: Secondary | ICD-10-CM | POA: Diagnosis not present

## 2016-11-17 DIAGNOSIS — Z96652 Presence of left artificial knee joint: Secondary | ICD-10-CM | POA: Diagnosis not present

## 2016-11-17 DIAGNOSIS — C250 Malignant neoplasm of head of pancreas: Secondary | ICD-10-CM | POA: Diagnosis not present

## 2016-11-17 DIAGNOSIS — E1165 Type 2 diabetes mellitus with hyperglycemia: Secondary | ICD-10-CM | POA: Diagnosis not present

## 2016-11-17 DIAGNOSIS — E785 Hyperlipidemia, unspecified: Secondary | ICD-10-CM | POA: Diagnosis not present

## 2016-11-17 DIAGNOSIS — I129 Hypertensive chronic kidney disease with stage 1 through stage 4 chronic kidney disease, or unspecified chronic kidney disease: Secondary | ICD-10-CM | POA: Diagnosis not present

## 2016-11-17 DIAGNOSIS — E1142 Type 2 diabetes mellitus with diabetic polyneuropathy: Secondary | ICD-10-CM | POA: Diagnosis not present

## 2016-11-17 DIAGNOSIS — D631 Anemia in chronic kidney disease: Secondary | ICD-10-CM | POA: Diagnosis not present

## 2016-11-17 DIAGNOSIS — Z981 Arthrodesis status: Secondary | ICD-10-CM | POA: Diagnosis not present

## 2016-11-18 ENCOUNTER — Telehealth: Payer: Self-pay | Admitting: *Deleted

## 2016-11-18 ENCOUNTER — Other Ambulatory Visit: Payer: Self-pay | Admitting: Radiology

## 2016-11-18 DIAGNOSIS — N183 Chronic kidney disease, stage 3 (moderate): Secondary | ICD-10-CM | POA: Diagnosis not present

## 2016-11-18 DIAGNOSIS — E785 Hyperlipidemia, unspecified: Secondary | ICD-10-CM | POA: Diagnosis not present

## 2016-11-18 DIAGNOSIS — E1142 Type 2 diabetes mellitus with diabetic polyneuropathy: Secondary | ICD-10-CM | POA: Diagnosis not present

## 2016-11-18 DIAGNOSIS — M797 Fibromyalgia: Secondary | ICD-10-CM | POA: Diagnosis not present

## 2016-11-18 DIAGNOSIS — D631 Anemia in chronic kidney disease: Secondary | ICD-10-CM | POA: Diagnosis not present

## 2016-11-18 DIAGNOSIS — Z981 Arthrodesis status: Secondary | ICD-10-CM | POA: Diagnosis not present

## 2016-11-18 DIAGNOSIS — E1165 Type 2 diabetes mellitus with hyperglycemia: Secondary | ICD-10-CM | POA: Diagnosis not present

## 2016-11-18 DIAGNOSIS — C250 Malignant neoplasm of head of pancreas: Secondary | ICD-10-CM | POA: Diagnosis not present

## 2016-11-18 DIAGNOSIS — Z96652 Presence of left artificial knee joint: Secondary | ICD-10-CM | POA: Diagnosis not present

## 2016-11-18 DIAGNOSIS — I129 Hypertensive chronic kidney disease with stage 1 through stage 4 chronic kidney disease, or unspecified chronic kidney disease: Secondary | ICD-10-CM | POA: Diagnosis not present

## 2016-11-18 NOTE — Telephone Encounter (Signed)
Patient is currently battling pancreatic cancer and is to start chemo for this- she is  currently on Toujeo 30 u at night and novalog 5u 3x a dy if blood sugar is greater then 200. Blood sugars are still running over 200 with occasional 300. She is very noncompliant with her diet. Increae toujeo to 35u at nght an 7u 3x a day for blood suf=gar greater then 200. Will talk with tammy about a more appropriate sliding scale.

## 2016-11-18 NOTE — Telephone Encounter (Signed)
Call from pt's husband to follow up request to move 6/1 appts to 6/5. Spoke with Verdis Frederickson, scheduler: requested she handle pt's request. (@ messages have been sent.)

## 2016-11-18 NOTE — Progress Notes (Signed)
CBC, BMP, HGA1C results routed via epic to Dr Arne Cleveland

## 2016-11-19 ENCOUNTER — Encounter (HOSPITAL_COMMUNITY)
Admission: RE | Disposition: A | Discharge status: Home / Self Care | Payer: Self-pay | Source: Ambulatory Visit | Attending: Interventional Radiology

## 2016-11-19 ENCOUNTER — Ambulatory Visit (HOSPITAL_COMMUNITY)
Admission: RE | Admit: 2016-11-19 | Discharge: 2016-11-19 | Disposition: A | Discharge status: Home / Self Care | Payer: Medicare Other | Source: Ambulatory Visit | Attending: Interventional Radiology | Admitting: Interventional Radiology

## 2016-11-19 ENCOUNTER — Telehealth: Payer: Self-pay | Admitting: Oncology

## 2016-11-19 ENCOUNTER — Ambulatory Visit (HOSPITAL_COMMUNITY): Payer: Medicare Other | Admitting: Certified Registered"

## 2016-11-19 ENCOUNTER — Ambulatory Visit (HOSPITAL_COMMUNITY)
Admission: RE | Admit: 2016-11-19 | Discharge: 2016-11-19 | Disposition: A | Discharge status: Home / Self Care | Payer: Medicare Other | Source: Ambulatory Visit | Attending: Oncology | Admitting: Oncology

## 2016-11-19 ENCOUNTER — Encounter (HOSPITAL_COMMUNITY): Payer: Self-pay | Admitting: Certified Registered"

## 2016-11-19 DIAGNOSIS — C250 Malignant neoplasm of head of pancreas: Secondary | ICD-10-CM

## 2016-11-19 DIAGNOSIS — E669 Obesity, unspecified: Secondary | ICD-10-CM | POA: Insufficient documentation

## 2016-11-19 DIAGNOSIS — M797 Fibromyalgia: Secondary | ICD-10-CM | POA: Insufficient documentation

## 2016-11-19 DIAGNOSIS — C259 Malignant neoplasm of pancreas, unspecified: Secondary | ICD-10-CM | POA: Insufficient documentation

## 2016-11-19 DIAGNOSIS — Z85828 Personal history of other malignant neoplasm of skin: Secondary | ICD-10-CM | POA: Insufficient documentation

## 2016-11-19 DIAGNOSIS — Z6831 Body mass index (BMI) 31.0-31.9, adult: Secondary | ICD-10-CM | POA: Insufficient documentation

## 2016-11-19 DIAGNOSIS — N183 Chronic kidney disease, stage 3 (moderate): Secondary | ICD-10-CM | POA: Insufficient documentation

## 2016-11-19 DIAGNOSIS — I129 Hypertensive chronic kidney disease with stage 1 through stage 4 chronic kidney disease, or unspecified chronic kidney disease: Secondary | ICD-10-CM | POA: Insufficient documentation

## 2016-11-19 DIAGNOSIS — Z90710 Acquired absence of both cervix and uterus: Secondary | ICD-10-CM | POA: Insufficient documentation

## 2016-11-19 DIAGNOSIS — Z452 Encounter for adjustment and management of vascular access device: Secondary | ICD-10-CM | POA: Diagnosis not present

## 2016-11-19 DIAGNOSIS — Z794 Long term (current) use of insulin: Secondary | ICD-10-CM | POA: Diagnosis not present

## 2016-11-19 DIAGNOSIS — Z79899 Other long term (current) drug therapy: Secondary | ICD-10-CM | POA: Insufficient documentation

## 2016-11-19 DIAGNOSIS — M170 Bilateral primary osteoarthritis of knee: Secondary | ICD-10-CM | POA: Insufficient documentation

## 2016-11-19 DIAGNOSIS — E1122 Type 2 diabetes mellitus with diabetic chronic kidney disease: Secondary | ICD-10-CM | POA: Insufficient documentation

## 2016-11-19 DIAGNOSIS — Z5111 Encounter for antineoplastic chemotherapy: Secondary | ICD-10-CM | POA: Diagnosis not present

## 2016-11-19 DIAGNOSIS — F329 Major depressive disorder, single episode, unspecified: Secondary | ICD-10-CM | POA: Insufficient documentation

## 2016-11-19 HISTORY — PX: RADIOLOGY WITH ANESTHESIA: SHX6223

## 2016-11-19 HISTORY — PX: IR US GUIDE VASC ACCESS RIGHT: IMG2390

## 2016-11-19 HISTORY — PX: IR FLUORO GUIDE PORT INSERTION RIGHT: IMG5741

## 2016-11-19 LAB — GLUCOSE, CAPILLARY
Glucose-Capillary: 280 mg/dL — ABNORMAL HIGH (ref 65–99)
Glucose-Capillary: 266 mg/dL — ABNORMAL HIGH (ref 65–99)

## 2016-11-19 SURGERY — RADIOLOGY WITH ANESTHESIA
Anesthesia: Monitor Anesthesia Care

## 2016-11-19 MED ORDER — HEPARIN SOD (PORK) LOCK FLUSH 100 UNIT/ML IV SOLN
INTRAVENOUS | Status: AC | PRN
  Administered 2016-11-19: 500 [IU]

## 2016-11-19 MED ORDER — ONDANSETRON HCL 4 MG/2ML IJ SOLN
INTRAMUSCULAR | Status: DC | PRN
  Administered 2016-11-19: 4 mg via INTRAVENOUS

## 2016-11-19 MED ORDER — LIDOCAINE-EPINEPHRINE (PF) 2 %-1:200000 IJ SOLN
INTRAMUSCULAR | Status: AC
  Filled 2016-11-19: qty 20

## 2016-11-19 MED ORDER — CEFAZOLIN SODIUM-DEXTROSE 2-4 GM/100ML-% IV SOLN
INTRAVENOUS | Status: AC
  Filled 2016-11-19: qty 100

## 2016-11-19 MED ORDER — PROPOFOL 10 MG/ML IV BOLUS
INTRAVENOUS | Status: DC | PRN
  Administered 2016-11-19: 10 mg via INTRAVENOUS
  Administered 2016-11-19: 40 mg via INTRAVENOUS
  Administered 2016-11-19 (×2): 10 mg via INTRAVENOUS

## 2016-11-19 MED ORDER — LIDOCAINE-EPINEPHRINE (PF) 2 %-1:200000 IJ SOLN
INTRAMUSCULAR | Status: AC | PRN
  Administered 2016-11-19: 20 mL

## 2016-11-19 MED ORDER — MIDAZOLAM HCL 2 MG/2ML IJ SOLN
INTRAMUSCULAR | Status: AC
  Filled 2016-11-19: qty 2

## 2016-11-19 MED ORDER — FENTANYL CITRATE (PF) 100 MCG/2ML IJ SOLN
INTRAMUSCULAR | Status: AC
  Filled 2016-11-19: qty 2

## 2016-11-19 MED ORDER — LIDOCAINE 2% (20 MG/ML) 5 ML SYRINGE
INTRAMUSCULAR | Status: DC | PRN
  Administered 2016-11-19: 40 mg via INTRAVENOUS

## 2016-11-19 MED ORDER — HEPARIN SOD (PORK) LOCK FLUSH 100 UNIT/ML IV SOLN
INTRAVENOUS | Status: AC
  Filled 2016-11-19: qty 5

## 2016-11-19 MED ORDER — PROPOFOL 10 MG/ML IV BOLUS
INTRAVENOUS | Status: AC
  Filled 2016-11-19: qty 20

## 2016-11-19 MED ORDER — LACTATED RINGERS IV SOLN
INTRAVENOUS | Status: DC
  Administered 2016-11-19 (×3): via INTRAVENOUS

## 2016-11-19 MED ORDER — CEFAZOLIN SODIUM-DEXTROSE 2-4 GM/100ML-% IV SOLN
2.0000 g | Freq: Once | INTRAVENOUS | Status: AC
  Administered 2016-11-19: 2 g via INTRAVENOUS
  Filled 2016-11-19: qty 100

## 2016-11-19 MED ORDER — SODIUM CHLORIDE 0.9 % IV SOLN: INTRAVENOUS | Status: DC

## 2016-11-19 MED ORDER — LIDOCAINE HCL 1 % IJ SOLN
INTRAMUSCULAR | Status: AC
  Filled 2016-11-19: qty 20

## 2016-11-19 MED ORDER — FENTANYL CITRATE (PF) 100 MCG/2ML IJ SOLN
INTRAMUSCULAR | Status: DC | PRN
  Administered 2016-11-19: 25 ug via INTRAVENOUS

## 2016-11-19 NOTE — Telephone Encounter (Signed)
Per schedule message/Tanya, reschedule 11/21/16 appointments/chemo to 11/25/16. Date is capped. Message/email sent to Meade Maw with request to add. Lab, flush and follow up with Lattie Haw, was scheduled for 11/25/16 @ 11:15 a.m. Per [place].

## 2016-11-19 NOTE — H&P (Signed)
Referring Physician(s): Ladell Pier  Supervising Physician: Arne Cleveland  Patient Status:  [facility] OP  Chief Complaint:  "I'm getting a port a cath"  Subjective: Patient familiar to IR service from prior PTC with internal/external biliary drain placement on 09/29/16. She subsequently underwent common bile duct metallic stent placement on 10/09/16. Her biliary drain was removed on 10/20/16. She has a known history of pancreatic cancer and presents today for Port-A-Cath placement for planned chemotherapy. She's had a prior paradoxical reaction with versed and therefore anesthesia assistance has been scheduled for procedure. She currently denies fever, headache, chest pain, dyspnea, cough, nausea, vomiting or abnormal bleeding. She does have some intermittent abdominal and back discomfort. Past Medical History:  Diagnosis Date  . Anxiety    "not all the time"   . Chronic lower back pain   . CKD (chronic kidney disease) stage 3, GFR 30-59 ml/min   . Complication of anesthesia 2002   difficulty /w intubation, with plastic surgery , because she was feeling so anxious. 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 .   Pt. reports that she  is very sensitive to med. & is hard  to wake up.  . Depression    "not all the time"   . Difficult intubation 2002   with head surgery 2002 has done ok with later intubations  . Dysrhythmia    told that she sometimes has extra beats. Does not see cardiologist, had a stress test many yrs. ago>10 yrs. ...update 11-14-16 " i havent had that feelign in a long time "  . Esophageal dysmotility    "getting worse recenly" (12/15/2013)  . Fibromyalgia   . Hypertension   . Nephritis ~ 1955  . Osteoarthritis    knees   . Pancreatic cancer ([place])   . Pancreatic mass   . Renal insufficiency   . Seasonal allergies    "in the spring"  . Shortness of breath    walking long distances   . squamous cell carcinoma scalp    squamous, on scalp, & has had several  surgeries on scalp, for plastics repair of scalp   . Type II diabetes mellitus ([place])    Past Surgical History:  Procedure Laterality Date  . ABDOMINAL HYSTERECTOMY     complete  . APPENDECTOMY    . BACK SURGERY  07/2016   L 4 and L 5 fusion done at [facility]   . CARPAL TUNNEL RELEASE Right   . CARPAL TUNNEL RELEASE Left   . COLONOSCOPY    . CYST EXCISION  1990's?   scalp  . ESOPHAGOGASTRODUODENOSCOPY N/A 09/26/2016   Procedure: ESOPHAGOGASTRODUODENOSCOPY (EGD);  Surgeon: Gatha Mayer, MD;  Location: [facility] ENDOSCOPY;  Service: Endoscopy;  Laterality: N/A;  . EUS N/A 10/09/2016   Procedure: UPPER ENDOSCOPIC ULTRASOUND (EUS) LINEAR;  Surgeon: Milus Banister, MD;  Location: [facility] ENDOSCOPY;  Service: Endoscopy;  Laterality: N/A;  . IR BILIARY STENT(S) EXISTING ACCESS INC DILATION CATH EXCHANGE  10/09/2016  . IR CHOLANGIOGRAM EXISTING TUBE  10/15/2016  . IR INT EXT BILIARY DRAIN WITH CHOLANGIOGRAM  09/29/2016   2 gallstones  . IR REMOVAL BILIARY DRAIN  10/20/2016  . LUMBAR DISC SURGERY  ~ 2012 X 2   double fusion ; last was 08-21-15  . SKIN GRAFT  2002   scalp; "went to [facility] to repair OR earlier in the yearr"  . SQUAMOUS CELL CARCINOMA EXCISION  2002   "off scalp; cancerous area removed and stapled; Dr. Georgia Lopes"  . TONSILLECTOMY  age 33  . TOTAL KNEE ARTHROPLASTY Left 12/14/2013   Procedure: LEFT TOTAL KNEE ARTHROPLASTY;  Surgeon: Ninetta Lights, MD;  Location: [facility];  Service: Orthopedics;  Laterality: Left;  . TOTAL KNEE ARTHROPLASTY Left 12/15/2013     Allergies: Patient has no known allergies.  Medications: Prior to Admission medications   Medication Sig Start Date End Date Taking? Authorizing Provider  carvedilol (COREG) 25 MG tablet TAKE  (1)  TABLET TWICE A DAY WITH MEALS (BREAKFAST AND SUPPER) Patient taking differently: Take 25 mg by mouth 2 (two) times daily with a meal.  07/18/16   Hassell Done, Mary-Margaret, FNP  DULoxetine (CYMBALTA) 60 MG capsule Take 1 capsule (60 mg total) by mouth  2 (two) times daily. 07/18/16   Hassell Done, Mary-Margaret, FNP  glucose blood (ONE TOUCH ULTRA TEST) test strip Test up to qid. DX:E11.65 11/13/16   Hassell Done Mary-Margaret, FNP  HYDROcodone-acetaminophen (NORCO) 7.5-325 MG tablet Take 1-2 tablets by mouth every 6 (six) hours as needed for moderate pain. 10/16/16   Pyrtle, Lajuan Lines, MD  Insulin Glargine (TOUJEO SOLOSTAR) 300 UNIT/ML SOPN Inject 30 Units into the skin daily. 11/04/16   Carmin Muskrat, MD  insulin lispro (HUMALOG) 100 UNIT/ML injection Inject into the skin 3 (three) times daily before meals. Meal coverage 11/07/16   [provider]  lactulose (CHRONULAC) 10 GM/15ML solution Take 30 mLs (20 g total) by mouth daily as needed for mild constipation. Patient not taking: Reported on 11/07/2016 10/31/16   Regalado, Jerald Kief A, MD  lidocaine-prilocaine (EMLA) cream Apply to port site one hour prior to use. Do not rub in. Cover with plastic. Patient not taking: Reported on 11/11/2016 11/10/16   Ladell Pier, MD  ondansetron (ZOFRAN) 8 MG tablet Take 1 tablet (8 mg total) by mouth every 8 (eight) hours as needed for nausea or vomiting. Patient not taking: Reported on 11/11/2016 11/10/16   Ladell Pier, MD  pantoprazole (PROTONIX) 40 MG tablet Take 1 tablet (40 mg total) by mouth daily before breakfast. 09/26/16   Gatha Mayer, MD  risperiDONE (RISPERDAL) 0.25 MG tablet Take 1 tablet (0.25 mg total) by mouth 2 (two) times daily. 10/31/16   Regalado, Jerald Kief A, MD     Vital Signs:Blood pressure 130/81, heart rate 61, respirations 18, O2 sat 96% room air, temperature 97.9    Physical Exam Awake, alert. Chest clear to auscultation bilaterally. Heart with regular rate and rhythm. Abdomen obese, soft, positive bowel sounds, nontender; mild bilateral lower extremity edema.  Imaging: No results found.  Labs:  CBC:  Recent Labs  10/27/16 1120 10/28/16 0654 11/04/16 1448 11/14/16 1349  WBC 7.8 6.4 7.4 9.6  HGB 9.2* 8.1* 8.9* 9.0*  HCT  29.2* 25.2* 27.6* 28.1*  PLT 533* 423* 438* 356    COAGS:  Recent Labs  09/20/16 1314 09/29/16 1350 10/09/16 0609 10/27/16 1125 11/14/16 1349  INR 0.86 0.90 1.03 0.92 0.90  APTT 33  --   --   --  35    BMP:  Recent Labs  10/29/16 0549 10/30/16 1127 11/04/16 1448 11/14/16 1349  NA 132* 139 129* 138  K 4.6 4.8 5.1 4.6  CL 100* 107 94* 102  CO2 '26 27 28 29  '$ GLUCOSE 157* 143* 546* 231*  BUN '19 17 13 20  '$ CALCIUM 8.8* 9.0 9.1 9.2  CREATININE 1.29* 1.11* 1.10* 1.35*  GFRNONAA 40* 48* 49* 38*  GFRAA 47* 56* 57* 44*    LIVER FUNCTION TESTS:  Recent Labs  10/13/16 1450 10/22/16  1343 10/27/16 1120 10/28/16 0654  BILITOT 3.8* 2.11* 1.7* 1.2  AST 33 46* 51* 39  ALT 22 33 33 26  ALKPHOS 324* 615* 590* 439*  PROT 6.4 7.0 7.2 5.6*  ALBUMIN 3.1* 2.4* 2.8* 2.2*    Assessment and Plan: Patient with history of pancreatic carcinoma who presents today for Port-A-Cath placement for planned chemotherapy. Due to past paradoxical reaction to Versed anesthesia assistance will be utilized for procedure today.Risks and benefits discussed with the patient/spouse including, but not limited to bleeding, infection, pneumothorax, or fibrin sheath development and need for additional procedures.All of the patient's questions were answered, patient is agreeable to proceed.Consent signed and in chart.     Electronically Signed: D. Rowe Robert, PA-C 11/19/2016, 9:41 AM   I spent a total of 20 minutes  at the the patient's bedside AND on the patient's hospital floor or unit, greater than 50% of which was counseling/coordinating care for Port-A-Cath placement

## 2016-11-19 NOTE — Discharge Instructions (Signed)
Moderate Conscious Sedation, Adult, Care After °These instructions provide you with information about caring for yourself after your procedure. Your health care provider may also give you more specific instructions. Your treatment has been planned according to current medical practices, but problems sometimes occur. Call your health care provider if you have any problems or questions after your procedure. °What can I expect after the procedure? °After your procedure, it is common: °· To feel sleepy for several hours. °· To feel clumsy and have poor balance for several hours. °· To have poor judgment for several hours. °· To vomit if you eat too soon. °Follow these instructions at home: °For at least 24 hours after the procedure:  ° °· Do not: °¨ Participate in activities where you could fall or become injured. °¨ Drive. °¨ Use heavy machinery. °¨ Drink alcohol. °¨ Take sleeping pills or medicines that cause drowsiness. °¨ Make important decisions or sign legal documents. °¨ Take care of children on your own. °· Rest. °Eating and drinking  °· Follow the diet recommended by your health care provider. °· If you vomit: °¨ Drink water, juice, or soup when you can drink without vomiting. °¨ Make sure you have little or no nausea before eating solid foods. °General instructions  °· Have a responsible adult stay with you until you are awake and alert. °· Take over-the-counter and prescription medicines only as told by your health care provider. °· If you smoke, do not smoke without supervision. °· Keep all follow-up visits as told by your health care provider. This is important. °Contact a health care provider if: °· You keep feeling nauseous or you keep vomiting. °· You feel light-headed. °· You develop a rash. °· You have a fever. °Get help right away if: °· You have trouble breathing. °This information is not intended to replace advice given to you by your health care provider. Make sure you discuss any questions you  have with your health care provider. °Document Released: 03/30/2013 Document Revised: 11/12/2015 Document Reviewed: 09/29/2015 °Elsevier Interactive Patient Education © 2017 [employer] ° ° °Implanted Port Insertion, Care After °This sheet gives you information about how to care for yourself after your procedure. Your health care provider may also give you more specific instructions. If you have problems or questions, contact your health care provider. °What can I expect after the procedure? °After your procedure, it is common to have: °· Discomfort at the port insertion site. °· Bruising on the skin over the port. This should improve over 3-4 days. °Follow these instructions at home: °Port care  °· After your port is placed, you will get a manufacturer's information card. The card has information about your port. Keep this card with you at all times. °· Take care of the port as told by your health care provider. Ask your health care provider if you or a family member can get training for taking care of the port at home. A home health care nurse may also take care of the port. °· Make sure to remember what type of port you have. °Incision care  °· Follow instructions from your health care provider about how to take care of your port insertion site. Make sure you: °¨ Wash your hands with soap and water before you change your bandage (dressing). If soap and water are not available, use hand sanitizer. °¨ Change your dressing as told by your health care provider. °¨ Leave stitches (sutures), skin glue, or adhesive strips in place. These skin   closures may need to stay in place for 2 weeks or longer. If adhesive strip edges start to loosen and curl up, you may trim the loose edges. Do not remove adhesive strips completely unless your health care provider tells you to do that. °· Check your port insertion site every day for signs of infection. Check for: °¨ More redness, swelling, or pain. °¨ More fluid or  blood. °¨ Warmth. °¨ Pus or a bad smell. °General instructions  °· Do not take baths, swim, or use a hot tub until your health care provider approves. °· Do not lift anything that is heavier than 10 lb (4.5 kg) for a week, or as told by your health care provider. °· Ask your health care provider when it is okay to: °¨ Return to work or school. °¨ Resume usual physical activities or sports. °· Do not drive for 24 hours if you were given a medicine to help you relax (sedative). °· Take over-the-counter and prescription medicines only as told by your health care provider. °· Wear a medical alert bracelet in case of an emergency. This will tell any health care providers that you have a port. °· Keep all follow-up visits as told by your health care provider. This is important. °Contact a health care provider if: °· You cannot flush your port with saline as directed, or you cannot draw blood from the port. °· You have a fever or chills. °· You have more redness, swelling, or pain around your port insertion site. °· You have more fluid or blood coming from your port insertion site. °· Your port insertion site feels warm to the touch. °· You have pus or a bad smell coming from the port insertion site. °Get help right away if: °· You have chest pain or shortness of breath. °· You have bleeding from your port that you cannot control. °Summary °· Take care of the port as told by your health care provider. °· Change your dressing as told by your health care provider. °· Keep all follow-up visits as told by your health care provider. °This information is not intended to replace advice given to you by your health care provider. Make sure you discuss any questions you have with your health care provider. °Document Released: 03/30/2013 Document Revised: 04/30/2016 Document Reviewed: 04/30/2016 °Elsevier Interactive Patient Education © 2017 [employer] ° °

## 2016-11-19 NOTE — Anesthesia Preprocedure Evaluation (Addendum)
Anesthesia Evaluation  Patient identified by MRN, date of birth, ID band Patient awake    Reviewed: Allergy & Precautions, NPO status , Patient's Chart, lab work & pertinent test results  Airway Mallampati: I       Dental no notable dental hx. (+) Loose, Caps, Dental Advisory Given Loose caps in front:   Pulmonary    Pulmonary exam normal        Cardiovascular hypertension, Pt. on medications Normal cardiovascular exam Rhythm:Regular Rate:Normal     Neuro/Psych    GI/Hepatic   Endo/Other  diabetes  Renal/GU      Musculoskeletal   Abdominal (+) + obese,   Peds  Hematology  (+) Blood dyscrasia, anemia ,   Anesthesia Other Findings   Reproductive/Obstetrics                            Anesthesia Physical Anesthesia Plan  ASA: II  Anesthesia Plan: MAC   Post-op Pain Management:    Induction: Intravenous  Airway Management Planned: Natural Airway and Simple Face Mask  Additional Equipment:   Intra-op Plan:   Post-operative Plan:   Informed Consent: I have reviewed the patients History and Physical, chart, labs and discussed the procedure including the risks, benefits and alternatives for the proposed anesthesia with the patient or authorized representative who has indicated his/her understanding and acceptance.     Plan Discussed with: CRNA and Surgeon  Anesthesia Plan Comments:         Anesthesia Quick Evaluation

## 2016-11-19 NOTE — Sedation Documentation (Signed)
Patient monitored during procedure by anesthesia.  Please see anesthesia record for documentation.

## 2016-11-19 NOTE — Anesthesia Postprocedure Evaluation (Addendum)
Anesthesia Post Note  Patient: Cynde Menard Gadson  Procedure(s) Performed: Procedure(s) (LRB): PORT PLACEMENT WITH GENERAL ANESTHESIA (N/A)  Patient location during evaluation: PACU Anesthesia Type: MAC Level of consciousness: awake Pain management: pain level controlled Vital Signs Assessment: post-procedure vital signs reviewed and stable Respiratory status: spontaneous breathing Cardiovascular status: stable Postop Assessment: no signs of nausea or vomiting Anesthetic complications: no        Last Vitals:  Vitals:   11/19/16 1130 11/19/16 1140  BP: (!) 157/65 (!) 151/63  Pulse: 86 88  Resp: 17 18  Temp: 36.7 C 36.6 C    Last Pain:  Vitals:   11/19/16 0943  TempSrc:   PainSc: 0-No pain   Pain Goal:                 Roylee Chaffin JR,JOHN Driana Dazey

## 2016-11-19 NOTE — Procedures (Signed)
R IJ Port cathter placement with US and fluoroscopy No complication No blood loss. See complete dictation in [facility].  

## 2016-11-19 NOTE — H&P (Deleted)
  The note originally documented on this encounter has been moved the the encounter in which it belongs.  

## 2016-11-19 NOTE — Transfer of Care (Signed)
Immediate Anesthesia Transfer of Care Note  Patient: Patricia Austin  Procedure(s) Performed: Procedure(s): PORT PLACEMENT WITH GENERAL ANESTHESIA (N/A)  Patient Location: PACU  Anesthesia Type:MAC  Level of Consciousness: awake and alert   Airway & Oxygen Therapy: Patient Spontanous Breathing and Patient connected to nasal cannula oxygen  Post-op Assessment: Report given to RN and Post -op Vital signs reviewed and stable  Post vital signs: Reviewed and stable  Last Vitals:  Vitals:   11/19/16 0930  BP: 130/81  Pulse: 61  Resp: 18  Temp: 36.6 C    Last Pain:  Vitals:   11/19/16 0943  TempSrc:   PainSc: 0-No pain         Complications: No apparent anesthesia complications

## 2016-11-20 ENCOUNTER — Telehealth: Payer: Self-pay | Admitting: Oncology

## 2016-11-20 ENCOUNTER — Encounter (HOSPITAL_COMMUNITY): Payer: Self-pay | Admitting: Interventional Radiology

## 2016-11-20 DIAGNOSIS — C250 Malignant neoplasm of head of pancreas: Secondary | ICD-10-CM | POA: Diagnosis not present

## 2016-11-20 DIAGNOSIS — Z96652 Presence of left artificial knee joint: Secondary | ICD-10-CM | POA: Diagnosis not present

## 2016-11-20 DIAGNOSIS — E785 Hyperlipidemia, unspecified: Secondary | ICD-10-CM | POA: Diagnosis not present

## 2016-11-20 DIAGNOSIS — Z981 Arthrodesis status: Secondary | ICD-10-CM | POA: Diagnosis not present

## 2016-11-20 DIAGNOSIS — E1165 Type 2 diabetes mellitus with hyperglycemia: Secondary | ICD-10-CM | POA: Diagnosis not present

## 2016-11-20 DIAGNOSIS — M797 Fibromyalgia: Secondary | ICD-10-CM | POA: Diagnosis not present

## 2016-11-20 DIAGNOSIS — D631 Anemia in chronic kidney disease: Secondary | ICD-10-CM | POA: Diagnosis not present

## 2016-11-20 DIAGNOSIS — E1142 Type 2 diabetes mellitus with diabetic polyneuropathy: Secondary | ICD-10-CM | POA: Diagnosis not present

## 2016-11-20 DIAGNOSIS — I129 Hypertensive chronic kidney disease with stage 1 through stage 4 chronic kidney disease, or unspecified chronic kidney disease: Secondary | ICD-10-CM | POA: Diagnosis not present

## 2016-11-20 DIAGNOSIS — N183 Chronic kidney disease, stage 3 (moderate): Secondary | ICD-10-CM | POA: Diagnosis not present

## 2016-11-20 NOTE — Telephone Encounter (Signed)
Request to add chemo on 11/25/16 was approved by maggie. Request to be added was placed in [employer]

## 2016-11-21 ENCOUNTER — Other Ambulatory Visit: Payer: Self-pay

## 2016-11-21 ENCOUNTER — Ambulatory Visit: Payer: Self-pay | Admitting: Nurse Practitioner

## 2016-11-21 ENCOUNTER — Telehealth: Payer: Self-pay | Admitting: *Deleted

## 2016-11-21 ENCOUNTER — Ambulatory Visit: Payer: Self-pay

## 2016-11-21 DIAGNOSIS — I129 Hypertensive chronic kidney disease with stage 1 through stage 4 chronic kidney disease, or unspecified chronic kidney disease: Secondary | ICD-10-CM | POA: Diagnosis not present

## 2016-11-21 DIAGNOSIS — E785 Hyperlipidemia, unspecified: Secondary | ICD-10-CM | POA: Diagnosis not present

## 2016-11-21 DIAGNOSIS — Z96652 Presence of left artificial knee joint: Secondary | ICD-10-CM | POA: Diagnosis not present

## 2016-11-21 DIAGNOSIS — E1142 Type 2 diabetes mellitus with diabetic polyneuropathy: Secondary | ICD-10-CM | POA: Diagnosis not present

## 2016-11-21 DIAGNOSIS — Z981 Arthrodesis status: Secondary | ICD-10-CM | POA: Diagnosis not present

## 2016-11-21 DIAGNOSIS — M797 Fibromyalgia: Secondary | ICD-10-CM | POA: Diagnosis not present

## 2016-11-21 DIAGNOSIS — D631 Anemia in chronic kidney disease: Secondary | ICD-10-CM | POA: Diagnosis not present

## 2016-11-21 DIAGNOSIS — N183 Chronic kidney disease, stage 3 (moderate): Secondary | ICD-10-CM | POA: Diagnosis not present

## 2016-11-21 DIAGNOSIS — C250 Malignant neoplasm of head of pancreas: Secondary | ICD-10-CM | POA: Diagnosis not present

## 2016-11-21 DIAGNOSIS — E1165 Type 2 diabetes mellitus with hyperglycemia: Secondary | ICD-10-CM | POA: Diagnosis not present

## 2016-11-21 NOTE — Telephone Encounter (Signed)
Per Patricia Austin pt was scheduled for next week and aware to bring in a log of BP's and BS readings.

## 2016-11-21 NOTE — Telephone Encounter (Signed)
FYI - BP readings were 183/91 and 170/80 today. Sugars still over 200

## 2016-11-21 NOTE — Telephone Encounter (Signed)
Make appointment to see me next week with blood pressure readings and blood sugars- need 1/2 hour appointment

## 2016-11-21 NOTE — Addendum Note (Signed)
Addendum  created 11/21/16 1357 by Kailiana Granquist, MD   Sign clinical note    

## 2016-11-24 ENCOUNTER — Encounter: Payer: Self-pay | Admitting: Nurse Practitioner

## 2016-11-24 ENCOUNTER — Ambulatory Visit (INDEPENDENT_AMBULATORY_CARE_PROVIDER_SITE_OTHER): Payer: Medicare Other | Admitting: Nurse Practitioner

## 2016-11-24 VITALS — BP 162/82 | HR 75 | Temp 97.7°F | Ht 63.0 in | Wt 181.0 lb

## 2016-11-24 DIAGNOSIS — I129 Hypertensive chronic kidney disease with stage 1 through stage 4 chronic kidney disease, or unspecified chronic kidney disease: Secondary | ICD-10-CM | POA: Diagnosis not present

## 2016-11-24 DIAGNOSIS — M797 Fibromyalgia: Secondary | ICD-10-CM | POA: Diagnosis not present

## 2016-11-24 DIAGNOSIS — E1165 Type 2 diabetes mellitus with hyperglycemia: Secondary | ICD-10-CM

## 2016-11-24 DIAGNOSIS — N183 Chronic kidney disease, stage 3 (moderate): Secondary | ICD-10-CM | POA: Diagnosis not present

## 2016-11-24 DIAGNOSIS — Z794 Long term (current) use of insulin: Secondary | ICD-10-CM

## 2016-11-24 DIAGNOSIS — D631 Anemia in chronic kidney disease: Secondary | ICD-10-CM | POA: Diagnosis not present

## 2016-11-24 DIAGNOSIS — I10 Essential (primary) hypertension: Secondary | ICD-10-CM | POA: Diagnosis not present

## 2016-11-24 DIAGNOSIS — Z96652 Presence of left artificial knee joint: Secondary | ICD-10-CM | POA: Diagnosis not present

## 2016-11-24 DIAGNOSIS — E1169 Type 2 diabetes mellitus with other specified complication: Secondary | ICD-10-CM | POA: Diagnosis not present

## 2016-11-24 DIAGNOSIS — E785 Hyperlipidemia, unspecified: Secondary | ICD-10-CM | POA: Diagnosis not present

## 2016-11-24 DIAGNOSIS — IMO0002 Reserved for concepts with insufficient information to code with codable children: Secondary | ICD-10-CM

## 2016-11-24 DIAGNOSIS — E1142 Type 2 diabetes mellitus with diabetic polyneuropathy: Secondary | ICD-10-CM | POA: Diagnosis not present

## 2016-11-24 DIAGNOSIS — C250 Malignant neoplasm of head of pancreas: Secondary | ICD-10-CM | POA: Diagnosis not present

## 2016-11-24 DIAGNOSIS — Z981 Arthrodesis status: Secondary | ICD-10-CM | POA: Diagnosis not present

## 2016-11-24 MED ORDER — INSULIN LISPRO 100 UNIT/ML ~~LOC~~ SOLN: SUBCUTANEOUS | 99 refills | Status: DC

## 2016-11-24 MED ORDER — INSULIN GLARGINE 300 UNIT/ML ~~LOC~~ SOPN: 40.0000 [IU] | PEN_INJECTOR | Freq: Every day | SUBCUTANEOUS | 5 refills | Status: DC

## 2016-11-24 MED ORDER — AMLODIPINE BESYLATE 5 MG PO TABS: 5.0000 mg | ORAL_TABLET | Freq: Every day | ORAL | 3 refills | Status: DC

## 2016-11-24 NOTE — Progress Notes (Signed)
   Subjective:    Patient ID: Patricia Austin, female    DOB: 1946/01/26, 71 y.o.   MRN: [id number]  HPI Patient comes in today to discuss several things- she currently has pancreatic cancer- sh ehas not started her chemo treatments as of yet. -blood pressure elevated- have been running >546'T systolic- she was on amlodipine 5mg  daily- the hospital increased to 10mg  daily which ws causng blod pressure bottom out so she stopped amlodoipine all together and now iut is running to high. -She has also had blood sugars greater then 300 and some as high as 500- she will not watch diet- insists on eating whatever she wants to. She is currently on toujeo 35u at night nad has been doing 7u of humolog with each meal if blood sugar >300.    Review of Systems  Constitutional: Negative.   Respiratory: Negative.   Cardiovascular: Negative.   Gastrointestinal: Positive for abdominal pain (slight on occasion).  Genitourinary: Negative.   Neurological: Negative.   Psychiatric/Behavioral: Negative.   All other systems reviewed and are negative.      Objective:   Physical Exam  Constitutional: She is oriented to person, place, and time. She appears well-developed and well-nourished. No distress.  Cardiovascular: Normal rate and regular rhythm.   Pulmonary/Chest: Effort normal and breath sounds normal.  Abdominal: Soft. There is tenderness (throughout).  Neurological: She is alert and oriented to person, place, and time.  Skin: Skin is warm.  Psychiatric: She has a normal mood and affect. Her behavior is normal. Judgment and thought content normal.   BP (!) 162/82   Pulse 75   Temp 97.7 F (36.5 C) (Oral)   Ht 5\' 3"  (1.6 m)   Wt 181 lb (82.1 kg)   BMI 32.06 kg/m         Assessment & Plan:  1. Essential hypertension Low sodium diet - amLODipine (NORVASC) 5 MG tablet; Take 1 tablet (5 mg total) by mouth daily.  Dispense: 90 tablet; Refill: 3  2. Uncontrolled type 2 diabetes mellitus with other  specified complication, with long-term current use of insulin (HCC) Increase toujeo to 40u at night Sliding scale humalog 3x a day with meals  >150-  5u  151-200 8u  201-250 10u  251-300 12u  >300  15u - Insulin Glargine (TOUJEO SOLOSTAR) 300 UNIT/ML SOPN; Inject 40 Units into the skin daily.  Dispense: 6 pen; Refill: 5 - insulin lispro (HUMALOG) 100 UNIT/ML injection; Up to 15u with each meal  Dispense: 10 mL; Refill: prn  * consulted with T.Eckard- for sliding scale  Mary-Margaret Hassell Done, FNP

## 2016-11-24 NOTE — Patient Instructions (Signed)
Sliding scale humalog 3x a day with meals  >150-  5u  151-200 8u  201-250 10u  251-300 12u  >300  15u

## 2016-11-24 NOTE — Addendum Note (Signed)
Addended by: Rolena Infante on: 11/24/2016 10:22 AM   Modules accepted: Orders

## 2016-11-25 ENCOUNTER — Ambulatory Visit (HOSPITAL_BASED_OUTPATIENT_CLINIC_OR_DEPARTMENT_OTHER): Payer: Medicare Other

## 2016-11-25 ENCOUNTER — Other Ambulatory Visit (HOSPITAL_BASED_OUTPATIENT_CLINIC_OR_DEPARTMENT_OTHER): Payer: Medicare Other

## 2016-11-25 ENCOUNTER — Encounter: Payer: Self-pay | Admitting: Oncology

## 2016-11-25 ENCOUNTER — Ambulatory Visit (HOSPITAL_BASED_OUTPATIENT_CLINIC_OR_DEPARTMENT_OTHER): Payer: Medicare Other | Admitting: Nurse Practitioner

## 2016-11-25 ENCOUNTER — Ambulatory Visit: Payer: Medicare Other

## 2016-11-25 ENCOUNTER — Telehealth: Payer: Self-pay | Admitting: Oncology

## 2016-11-25 VITALS — BP 147/59 | HR 72 | Temp 98.5°F | Resp 17 | Ht 63.0 in | Wt 180.8 lb

## 2016-11-25 DIAGNOSIS — R109 Unspecified abdominal pain: Secondary | ICD-10-CM | POA: Diagnosis not present

## 2016-11-25 DIAGNOSIS — Z95828 Presence of other vascular implants and grafts: Secondary | ICD-10-CM

## 2016-11-25 DIAGNOSIS — C258 Malignant neoplasm of overlapping sites of pancreas: Secondary | ICD-10-CM | POA: Diagnosis not present

## 2016-11-25 DIAGNOSIS — C250 Malignant neoplasm of head of pancreas: Secondary | ICD-10-CM

## 2016-11-25 DIAGNOSIS — Z5111 Encounter for antineoplastic chemotherapy: Secondary | ICD-10-CM

## 2016-11-25 DIAGNOSIS — G893 Neoplasm related pain (acute) (chronic): Secondary | ICD-10-CM

## 2016-11-25 DIAGNOSIS — C259 Malignant neoplasm of pancreas, unspecified: Secondary | ICD-10-CM

## 2016-11-25 LAB — CBC WITH DIFFERENTIAL/PLATELET
BASO%: 0.5 % (ref 0.0–2.0)
Basophils Absolute: 0 10*3/uL (ref 0.0–0.1)
EOS ABS: 0.2 10*3/uL (ref 0.0–0.5)
EOS%: 2.2 % (ref 0.0–7.0)
HCT: 27.2 % — ABNORMAL LOW (ref 34.8–46.6)
HGB: 8.6 g/dL — ABNORMAL LOW (ref 11.6–15.9)
LYMPH%: 18.6 % (ref 14.0–49.7)
MCH: 28.9 pg (ref 25.1–34.0)
MCHC: 31.6 g/dL (ref 31.5–36.0)
MCV: 91.3 fL (ref 79.5–101.0)
MONO#: 0.6 10*3/uL (ref 0.1–0.9)
MONO%: 8.4 % (ref 0.0–14.0)
NEUT%: 70.3 % (ref 38.4–76.8)
NEUTROS ABS: 5.2 10*3/uL (ref 1.5–6.5)
NRBC: 0 % (ref 0–0)
PLATELETS: 323 10*3/uL (ref 145–400)
RBC: 2.98 10*6/uL — AB (ref 3.70–5.45)
RDW: 13.9 % (ref 11.2–14.5)
WBC: 7.4 10*3/uL (ref 3.9–10.3)
lymph#: 1.4 10*3/uL (ref 0.9–3.3)

## 2016-11-25 LAB — COMPREHENSIVE METABOLIC PANEL
ALT: 74 U/L — AB (ref 0–55)
ANION GAP: 9 meq/L (ref 3–11)
AST: 124 U/L — ABNORMAL HIGH (ref 5–34)
Albumin: 2.4 g/dL — ABNORMAL LOW (ref 3.5–5.0)
Alkaline Phosphatase: 947 U/L — ABNORMAL HIGH (ref 40–150)
BUN: 18.3 mg/dL (ref 7.0–26.0)
CHLORIDE: 104 meq/L (ref 98–109)
CO2: 27 meq/L (ref 22–29)
CREATININE: 1.1 mg/dL (ref 0.6–1.1)
Calcium: 9.6 mg/dL (ref 8.4–10.4)
EGFR: 49 mL/min/{1.73_m2} — AB (ref >90)
GLUCOSE: 157 mg/dL — AB (ref 70–140)
Potassium: 4 mEq/L (ref 3.5–5.1)
SODIUM: 140 meq/L (ref 136–145)
Total Bilirubin: 1.03 mg/dL (ref 0.20–1.20)
Total Protein: 6.2 g/dL — ABNORMAL LOW (ref 6.4–8.3)

## 2016-11-25 MED ORDER — HEPARIN SOD (PORK) LOCK FLUSH 100 UNIT/ML IV SOLN
500.0000 [IU] | Freq: Once | INTRAVENOUS | Status: AC | PRN
  Administered 2016-11-25: 500 [IU]
  Filled 2016-11-25: qty 5

## 2016-11-25 MED ORDER — SODIUM CHLORIDE 0.9 % IV SOLN
800.0000 mg/m2 | Freq: Once | INTRAVENOUS | Status: AC
  Administered 2016-11-25: 1520 mg via INTRAVENOUS
  Filled 2016-11-25: qty 39.98

## 2016-11-25 MED ORDER — SODIUM CHLORIDE 0.9% FLUSH
10.0000 mL | INTRAVENOUS | Status: DC | PRN
  Filled 2016-11-25: qty 10

## 2016-11-25 MED ORDER — SODIUM CHLORIDE 0.9 % IV SOLN
Freq: Once | INTRAVENOUS | Status: AC
  Administered 2016-11-25: 13:00:00 via INTRAVENOUS

## 2016-11-25 MED ORDER — PACLITAXEL PROTEIN-BOUND CHEMO INJECTION 100 MG
100.0000 mg/m2 | Freq: Once | INTRAVENOUS | Status: AC
  Administered 2016-11-25: 200 mg via INTRAVENOUS
  Filled 2016-11-25: qty 40

## 2016-11-25 MED ORDER — PROCHLORPERAZINE MALEATE 10 MG PO TABS
10.0000 mg | ORAL_TABLET | Freq: Once | ORAL | Status: AC
  Administered 2016-11-25: 10 mg via ORAL

## 2016-11-25 MED ORDER — SODIUM CHLORIDE 0.9% FLUSH
10.0000 mL | INTRAVENOUS | Status: AC | PRN
  Administered 2016-11-25 (×2): 10 mL via INTRAVENOUS
  Filled 2016-11-25: qty 10

## 2016-11-25 MED ORDER — PROCHLORPERAZINE MALEATE 10 MG PO TABS
ORAL_TABLET | ORAL | Status: AC
  Filled 2016-11-25: qty 1

## 2016-11-25 NOTE — Telephone Encounter (Signed)
Gave patient AVS and calender per 6/5 los.

## 2016-11-25 NOTE — Progress Notes (Addendum)
  [facility] OFFICE PROGRESS NOTE   Diagnosis:  Pancreas cancer  INTERVAL HISTORY:   Patricia Austin returns for follow-up. Appetite continues to be improved. Blood sugars overall well-controlled. Confusion is much better. She has mild generalized abdominal pain. No nausea or vomiting.  Objective:  Vital signs in last 24 hours:  Blood pressure (!) 147/59, pulse 72, temperature 98.5 F (36.9 C), temperature source Oral, resp. rate 17, height 5\' 3"  (1.6 m), weight 180 lb 12.8 oz (82 kg), SpO2 98 %.    HEENT: No thrush or ulcers. Resp: Lungs clear bilaterally. Cardio: Regular rate and rhythm. GI: Abdomen with mild generalized tenderness. Liver edge is palpable in the right mid and lateral upper abdomen. Vascular: Trace lower leg edema bilaterally. Neuro: Alert and oriented.  Port-A-Cath without erythema.  Lab Results:  Lab Results  Component Value Date   WBC 7.4 11/25/2016   HGB 8.6 (L) 11/25/2016   HCT 27.2 (L) 11/25/2016   MCV 91.3 11/25/2016   PLT 323 11/25/2016   NEUTROABS 5.2 11/25/2016    Imaging:  No results found.  Medications: I have reviewed the patient's current medications.  Assessment/Plan: 1. Adenocarcinoma the pancreas-pancreas head/uncinatemass noted on a CT abdomen/pelvis 09/20/2016, status post an EUS biopsy 10/09/2016 confirming adenocarcinoma  CT 09/20/2016-pancreas head/uncinatemass, possible contact with the superior mesenteric artery, biliary dilatation, small low lung nodules  CTs chest, abdomen, and pelvis 10/27/2016-enlarging pancreas head mass with vascular abutment, no evidence of metastatic disease  Cycle 1 gemcitabine/Abraxane 11/25/2016  2. Biliary obstruction secondary to #1, status post placement of a wall stent and external biliary drain 10/09/2016  Biliary drain removed 10/20/2016  3. Diabetes-admitted with marked hyperglycemia 10/27/2016  4. History of squamous cell carcinoma of the scalp  5. Lumbar  fusion surgery February 2018  6. 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 History of Abdominal/back pain secondary to pancreas cancer  8. Cholangitis 10/07/2016  9. Confusion during hospital admission 10/27/2016-etiology unclear, MRI brain negative for metastatic disease on 10/29/2016, improved  10. Port-A-Cath placement 11/19/2016     Disposition: Patricia Austin appears stable. Plan to proceed with cycle 1 gemcitabine/Abraxane today as scheduled. Potential toxicities again reviewed. She is agreeable to proceed. She will return for a follow-up visit and cycle 2 gemcitabine/Abraxane on 12/08/2016. She will contact the office in the interim with any problems.  Patient seen with Dr. Benay Spice.    Ned Card ANP/GNP-BC   11/25/2016  11:32 AM This was a shared visit with Ned Card. Patricia Austin has an improved performance status. The abdomen/back discomfort is likely secondary to pancreas cancer. The plan is to begin gemcitabine/Abraxane today. We decided to begin treatment on a day 1, day 15 schedule secondary to her comorbid conditions.  Julieanne Manson, M.D.

## 2016-11-25 NOTE — Progress Notes (Signed)
Faxed completed app and required docs needed for processing to the [facility].  I will notify pt of the outcome.  Pt exceeds the income requirement for the [facility].

## 2016-11-25 NOTE — Patient Instructions (Addendum)
[facility] Discharge Instructions for Patients Receiving Chemotherapy  Today you received the following chemotherapy agents Gemcitabine/Abraxane.   To help prevent nausea and vomiting after your treatment, we encourage you to take your nausea medication as directed.    If you develop nausea and vomiting that is not controlled by your nausea medication, call the clinic.   BELOW ARE SYMPTOMS THAT SHOULD BE REPORTED IMMEDIATELY:  *FEVER GREATER THAN 100.5 F  *CHILLS WITH OR WITHOUT FEVER  NAUSEA AND VOMITING THAT IS NOT CONTROLLED WITH YOUR NAUSEA MEDICATION  *UNUSUAL SHORTNESS OF BREATH  *UNUSUAL BRUISING OR BLEEDING  TENDERNESS IN MOUTH AND THROAT WITH OR WITHOUT PRESENCE OF ULCERS  *URINARY PROBLEMS  *BOWEL PROBLEMS  UNUSUAL RASH Items with * indicate a potential emergency and should be followed up as soon as possible.  Feel free to call the clinic you have any questions or concerns. The clinic phone number is [phone].  Please show the [facility] at check-in to the Emergency Department and triage nurse.  Gemcitabine injection What is this medicine? GEMCITABINE (jem SIT a been) is a chemotherapy drug. This medicine is used to treat many types of cancer like breast cancer, lung cancer, pancreatic cancer, and ovarian cancer. This medicine may be used for other purposes; ask your health care provider or pharmacist if you have questions. COMMON BRAND NAME(S): Gemzar What should I tell my health care provider before I take this medicine? They need to know if you have any of these conditions: -blood disorders -infection -kidney disease -liver disease -recent or ongoing radiation therapy -an unusual or allergic reaction to gemcitabine, other chemotherapy, other medicines, foods, dyes, or preservatives -pregnant or trying to get pregnant -breast-feeding How should I use this medicine? This drug is given as an infusion into a vein. It is  administered in a hospital or clinic by a specially trained health care professional. Talk to your pediatrician regarding the use of this medicine in children. Special care may be needed. Overdosage: If you think you have taken too much of this medicine contact a poison control center or emergency room at once. NOTE: This medicine is only for you. Do not share this medicine with others. What if I miss a dose? It is important not to miss your dose. Call your doctor or health care professional if you are unable to keep an appointment. What may interact with this medicine? -medicines to increase blood counts like filgrastim, pegfilgrastim, sargramostim -some other chemotherapy drugs like cisplatin -vaccines Talk to your doctor or health care professional before taking any of these medicines: -acetaminophen -aspirin -ibuprofen -ketoprofen -naproxen This list may not describe all possible interactions. Give your health care provider a list of all the medicines, herbs, non-prescription drugs, or dietary supplements you use. Also tell them if you smoke, drink alcohol, or use illegal drugs. Some items may interact with your medicine. What should I watch for while using this medicine? Visit your doctor for checks on your progress. This drug may make you feel generally unwell. This is not uncommon, as chemotherapy can affect healthy cells as well as cancer cells. Report any side effects. Continue your course of treatment even though you feel ill unless your doctor tells you to stop. In some cases, you may be given additional medicines to help with side effects. Follow all directions for their use. Call your doctor or health care professional for advice if you get a fever, chills or sore throat, or other symptoms of a cold  or flu. Do not treat yourself. This drug decreases your body's ability to fight infections. Try to avoid being around people who are sick. This medicine may increase your risk to bruise  or bleed. Call your doctor or health care professional if you notice any unusual bleeding. Be careful brushing and flossing your teeth or using a toothpick because you may get an infection or bleed more easily. If you have any dental work done, tell your dentist you are receiving this medicine. Avoid taking products that contain aspirin, acetaminophen, ibuprofen, naproxen, or ketoprofen unless instructed by your doctor. These medicines may hide a fever. Women should inform their doctor if they wish to become pregnant or think they might be pregnant. There is a potential for serious side effects to an unborn child. Talk to your health care professional or pharmacist for more information. Do not breast-feed an infant while taking this medicine. What side effects may I notice from receiving this medicine? Side effects that you should report to your doctor or health care professional as soon as possible: -allergic reactions like skin rash, itching or hives, swelling of the face, lips, or tongue -low blood counts - this medicine may decrease the number of white blood cells, red blood cells and platelets. You may be at increased risk for infections and bleeding. -signs of infection - fever or chills, cough, sore throat, pain or difficulty passing urine -signs of decreased platelets or bleeding - bruising, pinpoint red spots on the skin, black, tarry stools, blood in the urine -signs of decreased red blood cells - unusually weak or tired, fainting spells, lightheadedness -breathing problems -chest pain -mouth sores -nausea and vomiting -pain, swelling, redness at site where injected -pain, tingling, numbness in the hands or feet -stomach pain -swelling of ankles, feet, hands -unusual bleeding Side effects that usually do not require medical attention (report to your doctor or health care professional if they continue or are bothersome): -constipation -diarrhea -hair loss -loss of appetite -stomach  upset This list may not describe all possible side effects. Call your doctor for medical advice about side effects. You may report side effects to FDA at 1-800-FDA-1088. Where should I keep my medicine? This drug is given in a hospital or clinic and will not be stored at home. NOTE: This sheet is a summary. It may not cover all possible information. If you have questions about this medicine, talk to your doctor, pharmacist, or health care provider.  2018 Elsevier/Gold Standard (2007-10-19 18:45:54)  Nanoparticle Albumin-Bound Paclitaxel injection What is this medicine? NANOPARTICLE ALBUMIN-BOUND PACLITAXEL (Na no PAHR ti kuhl al BYOO muhn-bound PAK li TAX el) is a chemotherapy drug. It targets fast dividing cells, like cancer cells, and causes these cells to die. This medicine is used to treat advanced breast cancer and advanced lung cancer. This medicine may be used for other purposes; ask your health care provider or pharmacist if you have questions. COMMON BRAND NAME(S): Abraxane What should I tell my health care provider before I take this medicine? They need to know if you have any of these conditions: -kidney disease -liver disease -low blood counts, like low platelets, red blood cells, or white blood cells -recent or ongoing radiation therapy -an unusual or allergic reaction to paclitaxel, albumin, other chemotherapy, other medicines, foods, dyes, or preservatives -pregnant or trying to get pregnant -breast-feeding How should I use this medicine? This drug is given as an infusion into a vein. It is administered in a hospital or clinic by a specially  trained health care professional. Talk to your pediatrician regarding the use of this medicine in children. Special care may be needed. Overdosage: If you think you have taken too much of this medicine contact a poison control center or emergency room at once. NOTE: This medicine is only for you. Do not share this medicine with  others. What if I miss a dose? It is important not to miss your dose. Call your doctor or health care professional if you are unable to keep an appointment. What may interact with this medicine? -cyclosporine -diazepam -ketoconazole -medicines to increase blood counts like filgrastim, pegfilgrastim, sargramostim -other chemotherapy drugs like cisplatin, doxorubicin, epirubicin, etoposide, teniposide, vincristine -quinidine -testosterone -vaccines -verapamil Talk to your doctor or health care professional before taking any of these medicines: -acetaminophen -aspirin -ibuprofen -ketoprofen -naproxen This list may not describe all possible interactions. Give your health care provider a list of all the medicines, herbs, non-prescription drugs, or dietary supplements you use. Also tell them if you smoke, drink alcohol, or use illegal drugs. Some items may interact with your medicine. What should I watch for while using this medicine? Your condition will be monitored carefully while you are receiving this medicine. You will need important blood work done while you are taking this medicine. This medicine can cause serious allergic reactions. If you experience allergic reactions like skin rash, itching or hives, swelling of the face, lips, or tongue, tell your doctor or health care professional right away. In some cases, you may be given additional medicines to help with side effects. Follow all directions for their use. This drug may make you feel generally unwell. This is not uncommon, as chemotherapy can affect healthy cells as well as cancer cells. Report any side effects. Continue your course of treatment even though you feel ill unless your doctor tells you to stop. Call your doctor or health care professional for advice if you get a fever, chills or sore throat, or other symptoms of a cold or flu. Do not treat yourself. This drug decreases your body's ability to fight infections. Try to avoid  being around people who are sick. This medicine may increase your risk to bruise or bleed. Call your doctor or health care professional if you notice any unusual bleeding. Be careful brushing and flossing your teeth or using a toothpick because you may get an infection or bleed more easily. If you have any dental work done, tell your dentist you are receiving this medicine. Avoid taking products that contain aspirin, acetaminophen, ibuprofen, naproxen, or ketoprofen unless instructed by your doctor. These medicines may hide a fever. Do not become pregnant while taking this medicine. Women should inform their doctor if they wish to become pregnant or think they might be pregnant. There is a potential for serious side effects to an unborn child. Talk to your health care professional or pharmacist for more information. Do not breast-feed an infant while taking this medicine. Men are advised not to father a child while receiving this medicine. What side effects may I notice from receiving this medicine? Side effects that you should report to your doctor or health care professional as soon as possible: -allergic reactions like skin rash, itching or hives, swelling of the face, lips, or tongue -low blood counts - This drug may decrease the number of white blood cells, red blood cells and platelets. You may be at increased risk for infections and bleeding. -signs of infection - fever or chills, cough, sore throat, pain or difficulty passing  urine -signs of decreased platelets or bleeding - bruising, pinpoint red spots on the skin, black, tarry stools, nosebleeds -signs of decreased red blood cells - unusually weak or tired, fainting spells, lightheadedness -breathing problems -changes in vision -chest pain -high or low blood pressure -mouth sores -nausea and vomiting -pain, swelling, redness or irritation at the injection site -pain, tingling, numbness in the hands or feet -slow or irregular  heartbeat -swelling of the ankle, feet, hands Side effects that usually do not require medical attention (report to your doctor or health care professional if they continue or are bothersome): -aches, pains -changes in the color of fingernails -diarrhea -hair loss -loss of appetite This list may not describe all possible side effects. Call your doctor for medical advice about side effects. You may report side effects to FDA at 1-800-FDA-1088. Where should I keep my medicine? This drug is given in a hospital or clinic and will not be stored at home. NOTE: This sheet is a summary. It may not cover all possible information. If you have questions about this medicine, talk to your doctor, pharmacist, or health care provider.  2018 Elsevier/Gold Standard (2015-04-11 10:05:20)

## 2016-11-25 NOTE — Patient Instructions (Signed)
Implanted Port Home Guide An implanted port is a type of central line that is placed under the skin. Central lines are used to provide IV access when treatment or nutrition needs to be given through a person's veins. Implanted ports are used for long-term IV access. An implanted port may be placed because:  You need IV medicine that would be irritating to the small veins in your hands or arms.  You need long-term IV medicines, such as antibiotics.  You need IV nutrition for a long period.  You need frequent blood draws for lab tests.  You need dialysis.  Implanted ports are usually placed in the chest area, but they can also be placed in the upper arm, the abdomen, or the leg. An implanted port has two main parts:  Reservoir. The reservoir is round and will appear as a small, raised area under your skin. The reservoir is the part where a needle is inserted to give medicines or draw blood.  Catheter. The catheter is a thin, flexible tube that extends from the reservoir. The catheter is placed into a large vein. Medicine that is inserted into the reservoir goes into the catheter and then into the vein.  How will I care for my incision site? Do not get the incision site wet. Bathe or shower as directed by your health care provider. How is my port accessed? Special steps must be taken to access the port:  Before the port is accessed, a numbing cream can be placed on the skin. This helps numb the skin over the port site.  Your health care provider uses a sterile technique to access the port. ? Your health care provider must put on a mask and sterile gloves. ? The skin over your port is cleaned carefully with an antiseptic and allowed to dry. ? The port is gently pinched between sterile gloves, and a needle is inserted into the port.  Only "non-coring" port needles should be used to access the port. Once the port is accessed, a blood return should be checked. This helps ensure that the port  is in the vein and is not clogged.  If your port needs to remain accessed for a constant infusion, a clear (transparent) bandage will be placed over the needle site. The bandage and needle will need to be changed every week, or as directed by your health care provider.  Keep the bandage covering the needle clean and dry. Do not get it wet. Follow your health care provider's instructions on how to take a shower or bath while the port is accessed.  If your port does not need to stay accessed, no bandage is needed over the port.  What is flushing? Flushing helps keep the port from getting clogged. Follow your health care provider's instructions on how and when to flush the port. Ports are usually flushed with saline solution or a medicine called heparin. The need for flushing will depend on how the port is used.  If the port is used for intermittent medicines or blood draws, the port will need to be flushed: ? After medicines have been given. ? After blood has been drawn. ? As part of routine maintenance.  If a constant infusion is running, the port may not need to be flushed.  How long will my port stay implanted? The port can stay in for as long as your health care provider thinks it is needed. When it is time for the port to come out, surgery will be   done to remove it. The procedure is similar to the one performed when the port was put in. When should I seek immediate medical care? When you have an implanted port, you should seek immediate medical care if:  You notice a bad smell coming from the incision site.  You have swelling, redness, or drainage at the incision site.  You have more swelling or pain at the port site or the surrounding area.  You have a fever that is not controlled with medicine.  This information is not intended to replace advice given to you by your health care provider. Make sure you discuss any questions you have with your health care provider. Document  Released: 06/09/2005 Document Revised: 11/15/2015 Document Reviewed: 02/14/2013 Elsevier Interactive Patient Education  2017 Elsevier Inc.  

## 2016-11-26 ENCOUNTER — Encounter: Payer: Self-pay | Admitting: Oncology

## 2016-11-26 DIAGNOSIS — E1142 Type 2 diabetes mellitus with diabetic polyneuropathy: Secondary | ICD-10-CM | POA: Diagnosis not present

## 2016-11-26 DIAGNOSIS — C250 Malignant neoplasm of head of pancreas: Secondary | ICD-10-CM | POA: Diagnosis not present

## 2016-11-26 DIAGNOSIS — Z981 Arthrodesis status: Secondary | ICD-10-CM | POA: Diagnosis not present

## 2016-11-26 DIAGNOSIS — I129 Hypertensive chronic kidney disease with stage 1 through stage 4 chronic kidney disease, or unspecified chronic kidney disease: Secondary | ICD-10-CM | POA: Diagnosis not present

## 2016-11-26 DIAGNOSIS — D631 Anemia in chronic kidney disease: Secondary | ICD-10-CM | POA: Diagnosis not present

## 2016-11-26 DIAGNOSIS — E1165 Type 2 diabetes mellitus with hyperglycemia: Secondary | ICD-10-CM | POA: Diagnosis not present

## 2016-11-26 DIAGNOSIS — N183 Chronic kidney disease, stage 3 (moderate): Secondary | ICD-10-CM | POA: Diagnosis not present

## 2016-11-26 DIAGNOSIS — E785 Hyperlipidemia, unspecified: Secondary | ICD-10-CM | POA: Diagnosis not present

## 2016-11-26 DIAGNOSIS — M797 Fibromyalgia: Secondary | ICD-10-CM | POA: Diagnosis not present

## 2016-11-26 DIAGNOSIS — Z96652 Presence of left artificial knee joint: Secondary | ICD-10-CM | POA: Diagnosis not present

## 2016-11-26 LAB — CANCER ANTIGEN 19-9: CA 19-9: 4866 U/mL — ABNORMAL HIGH (ref 0–35)

## 2016-11-26 NOTE — Progress Notes (Signed)
Pt is conditionally approved w/ the [facility] from 11/26/16 to 11/26/17 for $4,500.  A conditional approval status allows pt to receive one conditional dispense payment while they are validating pt's financial eligibility.  The conditional approval status is a temporary status and will be updated within 21 days or less upon final determination of pt eligibility.  The [facility] has contacted the pt and requested the 1040.  Pt will provide that info.

## 2016-11-27 ENCOUNTER — Telehealth: Payer: Self-pay

## 2016-11-27 NOTE — Telephone Encounter (Signed)
Chemo Follow Call. Reviewed potential side effects with patient's husband. Patient's husband reports that she is currently sleeping but has not experienced any adverse side effects. Reminded patient's husband to call the cancer center with any questions/concerns. Patient's husband verbalized understanding.

## 2016-11-27 NOTE — Telephone Encounter (Signed)
-----   Message from Ronnette Juniper, RN sent at 11/25/2016  3:15 PM EDT ----- Regarding: 1st [facility] 1st Abraxane/Gemcitabine. Tolerated well.

## 2016-11-28 DIAGNOSIS — E1142 Type 2 diabetes mellitus with diabetic polyneuropathy: Secondary | ICD-10-CM | POA: Diagnosis not present

## 2016-11-28 DIAGNOSIS — E1165 Type 2 diabetes mellitus with hyperglycemia: Secondary | ICD-10-CM | POA: Diagnosis not present

## 2016-11-28 DIAGNOSIS — D631 Anemia in chronic kidney disease: Secondary | ICD-10-CM | POA: Diagnosis not present

## 2016-11-28 DIAGNOSIS — M797 Fibromyalgia: Secondary | ICD-10-CM | POA: Diagnosis not present

## 2016-11-28 DIAGNOSIS — Z981 Arthrodesis status: Secondary | ICD-10-CM | POA: Diagnosis not present

## 2016-11-28 DIAGNOSIS — C250 Malignant neoplasm of head of pancreas: Secondary | ICD-10-CM | POA: Diagnosis not present

## 2016-11-28 DIAGNOSIS — Z96652 Presence of left artificial knee joint: Secondary | ICD-10-CM | POA: Diagnosis not present

## 2016-11-28 DIAGNOSIS — E785 Hyperlipidemia, unspecified: Secondary | ICD-10-CM | POA: Diagnosis not present

## 2016-11-28 DIAGNOSIS — I129 Hypertensive chronic kidney disease with stage 1 through stage 4 chronic kidney disease, or unspecified chronic kidney disease: Secondary | ICD-10-CM | POA: Diagnosis not present

## 2016-11-28 DIAGNOSIS — N183 Chronic kidney disease, stage 3 (moderate): Secondary | ICD-10-CM | POA: Diagnosis not present

## 2016-11-28 NOTE — Addendum Note (Signed)
Addendum  created 11/28/16 1301 by Lyn Hollingshead, MD   Sign clinical note

## 2016-12-01 ENCOUNTER — Telehealth: Payer: Self-pay | Admitting: Nurse Practitioner

## 2016-12-01 ENCOUNTER — Other Ambulatory Visit: Payer: Self-pay

## 2016-12-01 DIAGNOSIS — E1142 Type 2 diabetes mellitus with diabetic polyneuropathy: Secondary | ICD-10-CM | POA: Diagnosis not present

## 2016-12-01 DIAGNOSIS — N183 Chronic kidney disease, stage 3 (moderate): Secondary | ICD-10-CM | POA: Diagnosis not present

## 2016-12-01 DIAGNOSIS — E785 Hyperlipidemia, unspecified: Secondary | ICD-10-CM | POA: Diagnosis not present

## 2016-12-01 DIAGNOSIS — D631 Anemia in chronic kidney disease: Secondary | ICD-10-CM | POA: Diagnosis not present

## 2016-12-01 DIAGNOSIS — I129 Hypertensive chronic kidney disease with stage 1 through stage 4 chronic kidney disease, or unspecified chronic kidney disease: Secondary | ICD-10-CM | POA: Diagnosis not present

## 2016-12-01 DIAGNOSIS — E1165 Type 2 diabetes mellitus with hyperglycemia: Secondary | ICD-10-CM | POA: Diagnosis not present

## 2016-12-01 DIAGNOSIS — Z96652 Presence of left artificial knee joint: Secondary | ICD-10-CM | POA: Diagnosis not present

## 2016-12-01 DIAGNOSIS — Z981 Arthrodesis status: Secondary | ICD-10-CM | POA: Diagnosis not present

## 2016-12-01 DIAGNOSIS — C250 Malignant neoplasm of head of pancreas: Secondary | ICD-10-CM | POA: Diagnosis not present

## 2016-12-01 DIAGNOSIS — M797 Fibromyalgia: Secondary | ICD-10-CM | POA: Diagnosis not present

## 2016-12-01 MED ORDER — RISPERIDONE 0.25 MG PO TABS: 0.2500 mg | ORAL_TABLET | Freq: Two times a day (BID) | ORAL | 1 refills | Status: DC

## 2016-12-01 NOTE — Telephone Encounter (Signed)
Humalog filled 11/24/16   Risperdol Done Today  Sent to [facility]

## 2016-12-01 NOTE — Telephone Encounter (Signed)
What is the name of the medication? Humalog and Resperidone 25 mg Don't know for sure if she needs the Resperidone  Have you contacted your pharmacy to request a refill? NO  Which pharmacy would you like this sent to? [facility]   Patient notified that their request is being sent to the clinical staff for review and that they should receive a call once it is complete. If they do not receive a call within 24 hours they can check with their pharmacy or our office.

## 2016-12-02 DIAGNOSIS — I129 Hypertensive chronic kidney disease with stage 1 through stage 4 chronic kidney disease, or unspecified chronic kidney disease: Secondary | ICD-10-CM | POA: Diagnosis not present

## 2016-12-02 DIAGNOSIS — N183 Chronic kidney disease, stage 3 (moderate): Secondary | ICD-10-CM | POA: Diagnosis not present

## 2016-12-02 DIAGNOSIS — Z981 Arthrodesis status: Secondary | ICD-10-CM | POA: Diagnosis not present

## 2016-12-02 DIAGNOSIS — M797 Fibromyalgia: Secondary | ICD-10-CM | POA: Diagnosis not present

## 2016-12-02 DIAGNOSIS — E1142 Type 2 diabetes mellitus with diabetic polyneuropathy: Secondary | ICD-10-CM | POA: Diagnosis not present

## 2016-12-02 DIAGNOSIS — D631 Anemia in chronic kidney disease: Secondary | ICD-10-CM | POA: Diagnosis not present

## 2016-12-02 DIAGNOSIS — E1165 Type 2 diabetes mellitus with hyperglycemia: Secondary | ICD-10-CM | POA: Diagnosis not present

## 2016-12-02 DIAGNOSIS — C250 Malignant neoplasm of head of pancreas: Secondary | ICD-10-CM | POA: Diagnosis not present

## 2016-12-02 DIAGNOSIS — Z96652 Presence of left artificial knee joint: Secondary | ICD-10-CM | POA: Diagnosis not present

## 2016-12-02 DIAGNOSIS — E785 Hyperlipidemia, unspecified: Secondary | ICD-10-CM | POA: Diagnosis not present

## 2016-12-03 DIAGNOSIS — N183 Chronic kidney disease, stage 3 (moderate): Secondary | ICD-10-CM | POA: Diagnosis not present

## 2016-12-03 DIAGNOSIS — Z981 Arthrodesis status: Secondary | ICD-10-CM | POA: Diagnosis not present

## 2016-12-03 DIAGNOSIS — M797 Fibromyalgia: Secondary | ICD-10-CM | POA: Diagnosis not present

## 2016-12-03 DIAGNOSIS — C250 Malignant neoplasm of head of pancreas: Secondary | ICD-10-CM | POA: Diagnosis not present

## 2016-12-03 DIAGNOSIS — E785 Hyperlipidemia, unspecified: Secondary | ICD-10-CM | POA: Diagnosis not present

## 2016-12-03 DIAGNOSIS — E1142 Type 2 diabetes mellitus with diabetic polyneuropathy: Secondary | ICD-10-CM | POA: Diagnosis not present

## 2016-12-03 DIAGNOSIS — Z96652 Presence of left artificial knee joint: Secondary | ICD-10-CM | POA: Diagnosis not present

## 2016-12-03 DIAGNOSIS — I129 Hypertensive chronic kidney disease with stage 1 through stage 4 chronic kidney disease, or unspecified chronic kidney disease: Secondary | ICD-10-CM | POA: Diagnosis not present

## 2016-12-03 DIAGNOSIS — D631 Anemia in chronic kidney disease: Secondary | ICD-10-CM | POA: Diagnosis not present

## 2016-12-03 DIAGNOSIS — E1165 Type 2 diabetes mellitus with hyperglycemia: Secondary | ICD-10-CM | POA: Diagnosis not present

## 2016-12-04 DIAGNOSIS — E1142 Type 2 diabetes mellitus with diabetic polyneuropathy: Secondary | ICD-10-CM | POA: Diagnosis not present

## 2016-12-04 DIAGNOSIS — N183 Chronic kidney disease, stage 3 (moderate): Secondary | ICD-10-CM | POA: Diagnosis not present

## 2016-12-04 DIAGNOSIS — C250 Malignant neoplasm of head of pancreas: Secondary | ICD-10-CM | POA: Diagnosis not present

## 2016-12-04 DIAGNOSIS — M797 Fibromyalgia: Secondary | ICD-10-CM | POA: Diagnosis not present

## 2016-12-04 DIAGNOSIS — E1165 Type 2 diabetes mellitus with hyperglycemia: Secondary | ICD-10-CM | POA: Diagnosis not present

## 2016-12-04 DIAGNOSIS — D631 Anemia in chronic kidney disease: Secondary | ICD-10-CM | POA: Diagnosis not present

## 2016-12-04 DIAGNOSIS — I129 Hypertensive chronic kidney disease with stage 1 through stage 4 chronic kidney disease, or unspecified chronic kidney disease: Secondary | ICD-10-CM | POA: Diagnosis not present

## 2016-12-04 DIAGNOSIS — Z981 Arthrodesis status: Secondary | ICD-10-CM | POA: Diagnosis not present

## 2016-12-04 DIAGNOSIS — E785 Hyperlipidemia, unspecified: Secondary | ICD-10-CM | POA: Diagnosis not present

## 2016-12-04 DIAGNOSIS — Z96652 Presence of left artificial knee joint: Secondary | ICD-10-CM | POA: Diagnosis not present

## 2016-12-07 ENCOUNTER — Other Ambulatory Visit: Payer: Self-pay | Admitting: Oncology

## 2016-12-08 ENCOUNTER — Ambulatory Visit (HOSPITAL_BASED_OUTPATIENT_CLINIC_OR_DEPARTMENT_OTHER): Payer: Medicare Other

## 2016-12-08 ENCOUNTER — Ambulatory Visit (HOSPITAL_BASED_OUTPATIENT_CLINIC_OR_DEPARTMENT_OTHER): Payer: Medicare Other | Admitting: Oncology

## 2016-12-08 ENCOUNTER — Other Ambulatory Visit (HOSPITAL_BASED_OUTPATIENT_CLINIC_OR_DEPARTMENT_OTHER): Payer: Medicare Other

## 2016-12-08 VITALS — BP 156/71 | HR 97 | Temp 97.8°F | Resp 17

## 2016-12-08 DIAGNOSIS — C250 Malignant neoplasm of head of pancreas: Secondary | ICD-10-CM

## 2016-12-08 DIAGNOSIS — C258 Malignant neoplasm of overlapping sites of pancreas: Secondary | ICD-10-CM

## 2016-12-08 DIAGNOSIS — C259 Malignant neoplasm of pancreas, unspecified: Secondary | ICD-10-CM

## 2016-12-08 DIAGNOSIS — Z5111 Encounter for antineoplastic chemotherapy: Secondary | ICD-10-CM

## 2016-12-08 DIAGNOSIS — R55 Syncope and collapse: Secondary | ICD-10-CM

## 2016-12-08 LAB — CBC WITH DIFFERENTIAL/PLATELET
BASO%: 0.9 % (ref 0.0–2.0)
Basophils Absolute: 0 10*3/uL (ref 0.0–0.1)
EOS%: 0.4 % (ref 0.0–7.0)
Eosinophils Absolute: 0 10*3/uL (ref 0.0–0.5)
HEMATOCRIT: 28.1 % — AB (ref 34.8–46.6)
HGB: 9.3 g/dL — ABNORMAL LOW (ref 11.6–15.9)
LYMPH#: 0.9 10*3/uL (ref 0.9–3.3)
LYMPH%: 24.5 % (ref 14.0–49.7)
MCH: 28.7 pg (ref 25.1–34.0)
MCHC: 33.1 g/dL (ref 31.5–36.0)
MCV: 86.8 fL (ref 79.5–101.0)
MONO#: 0.6 10*3/uL (ref 0.1–0.9)
MONO%: 15.8 % — AB (ref 0.0–14.0)
NEUT#: 2.1 10*3/uL (ref 1.5–6.5)
NEUT%: 58.4 % (ref 38.4–76.8)
Platelets: 432 10*3/uL — ABNORMAL HIGH (ref 145–400)
RBC: 3.24 10*6/uL — ABNORMAL LOW (ref 3.70–5.45)
RDW: 14.6 % — AB (ref 11.2–14.5)
WBC: 3.7 10*3/uL — AB (ref 3.9–10.3)

## 2016-12-08 LAB — COMPREHENSIVE METABOLIC PANEL
ALBUMIN: 2.5 g/dL — AB (ref 3.5–5.0)
ALK PHOS: 601 U/L — AB (ref 40–150)
ALT: 26 U/L (ref 0–55)
AST: 33 U/L (ref 5–34)
Anion Gap: 8 mEq/L (ref 3–11)
BUN: 17.4 mg/dL (ref 7.0–26.0)
CO2: 27 mEq/L (ref 22–29)
CREATININE: 1.1 mg/dL (ref 0.6–1.1)
Calcium: 9.7 mg/dL (ref 8.4–10.4)
Chloride: 104 mEq/L (ref 98–109)
EGFR: 52 mL/min/{1.73_m2} — ABNORMAL LOW (ref >90)
GLUCOSE: 142 mg/dL — AB (ref 70–140)
POTASSIUM: 4.4 meq/L (ref 3.5–5.1)
SODIUM: 139 meq/L (ref 136–145)
Total Bilirubin: 0.53 mg/dL (ref 0.20–1.20)
Total Protein: 6.4 g/dL (ref 6.4–8.3)

## 2016-12-08 LAB — WHOLE BLOOD GLUCOSE: GLUCOSE: 140 mg/dL — AB (ref 70–100)

## 2016-12-08 MED ORDER — SODIUM CHLORIDE 0.9 % IV SOLN
Freq: Once | INTRAVENOUS | Status: AC
  Administered 2016-12-08: 08:00:00 via INTRAVENOUS

## 2016-12-08 MED ORDER — PROCHLORPERAZINE MALEATE 10 MG PO TABS
ORAL_TABLET | ORAL | Status: AC
  Filled 2016-12-08: qty 1

## 2016-12-08 MED ORDER — PROCHLORPERAZINE MALEATE 10 MG PO TABS
10.0000 mg | ORAL_TABLET | Freq: Once | ORAL | Status: AC
  Administered 2016-12-08: 10 mg via ORAL

## 2016-12-08 MED ORDER — SODIUM CHLORIDE 0.9 % IV SOLN
800.0000 mg/m2 | Freq: Once | INTRAVENOUS | Status: AC
  Administered 2016-12-08: 1520 mg via INTRAVENOUS
  Filled 2016-12-08: qty 39.98

## 2016-12-08 MED ORDER — SODIUM CHLORIDE 0.9% FLUSH
10.0000 mL | INTRAVENOUS | Status: DC | PRN
  Administered 2016-12-08: 10 mL
  Filled 2016-12-08: qty 10

## 2016-12-08 MED ORDER — HEPARIN SOD (PORK) LOCK FLUSH 100 UNIT/ML IV SOLN
500.0000 [IU] | Freq: Once | INTRAVENOUS | Status: AC | PRN
  Administered 2016-12-08: 500 [IU]
  Filled 2016-12-08: qty 5

## 2016-12-08 MED ORDER — PACLITAXEL PROTEIN-BOUND CHEMO INJECTION 100 MG
100.0000 mg/m2 | Freq: Once | INTRAVENOUS | Status: AC
  Administered 2016-12-08: 200 mg via INTRAVENOUS
  Filled 2016-12-08: qty 40

## 2016-12-08 NOTE — Progress Notes (Signed)
Per Dr Benay Spice ok to tx with today's labs. VSS at d/c pt ate and drank while in tx area. No complaints.

## 2016-12-08 NOTE — Patient Instructions (Signed)
[facility] Discharge Instructions for Patients Receiving Chemotherapy  Today you received the following chemotherapy agents Gemcitabine/Abraxane.   To help prevent nausea and vomiting after your treatment, we encourage you to take your nausea medication as directed.    If you develop nausea and vomiting that is not controlled by your nausea medication, call the clinic.   BELOW ARE SYMPTOMS THAT SHOULD BE REPORTED IMMEDIATELY:  *FEVER GREATER THAN 100.5 F  *CHILLS WITH OR WITHOUT FEVER  NAUSEA AND VOMITING THAT IS NOT CONTROLLED WITH YOUR NAUSEA MEDICATION  *UNUSUAL SHORTNESS OF BREATH  *UNUSUAL BRUISING OR BLEEDING  TENDERNESS IN MOUTH AND THROAT WITH OR WITHOUT PRESENCE OF ULCERS  *URINARY PROBLEMS  *BOWEL PROBLEMS  UNUSUAL RASH Items with * indicate a potential emergency and should be followed up as soon as possible.  Feel free to call the clinic you have any questions or concerns. The clinic phone number is [phone].  Please show the [place] at check-in to the Emergency Department and triage nurse.  Gemcitabine injection What is this medicine? GEMCITABINE (jem SIT a been) is a chemotherapy drug. This medicine is used to treat many types of cancer like breast cancer, lung cancer, pancreatic cancer, and ovarian cancer. This medicine may be used for other purposes; ask your health care provider or pharmacist if you have questions. COMMON BRAND NAME(S): Gemzar What should I tell my health care provider before I take this medicine? They need to know if you have any of these conditions: -blood disorders -infection -kidney disease -liver disease -recent or ongoing radiation therapy -an unusual or allergic reaction to gemcitabine, other chemotherapy, other medicines, foods, dyes, or preservatives -pregnant or trying to get pregnant -breast-feeding How should I use this medicine? This drug is given as an infusion into a vein. It is  administered in a hospital or clinic by a specially trained health care professional. Talk to your pediatrician regarding the use of this medicine in children. Special care may be needed. Overdosage: If you think you have taken too much of this medicine contact a poison control center or emergency room at once. NOTE: This medicine is only for you. Do not share this medicine with others. What if I miss a dose? It is important not to miss your dose. Call your doctor or health care professional if you are unable to keep an appointment. What may interact with this medicine? -medicines to increase blood counts like filgrastim, pegfilgrastim, sargramostim -some other chemotherapy drugs like cisplatin -vaccines Talk to your doctor or health care professional before taking any of these medicines: -acetaminophen -aspirin -ibuprofen -ketoprofen -naproxen This list may not describe all possible interactions. Give your health care provider a list of all the medicines, herbs, non-prescription drugs, or dietary supplements you use. Also tell them if you smoke, drink alcohol, or use illegal drugs. Some items may interact with your medicine. What should I watch for while using this medicine? Visit your doctor for checks on your progress. This drug may make you feel generally unwell. This is not uncommon, as chemotherapy can affect healthy cells as well as cancer cells. Report any side effects. Continue your course of treatment even though you feel ill unless your doctor tells you to stop. In some cases, you may be given additional medicines to help with side effects. Follow all directions for their use. Call your doctor or health care professional for advice if you get a fever, chills or sore throat, or other symptoms of a cold  or flu. Do not treat yourself. This drug decreases your body's ability to fight infections. Try to avoid being around people who are sick. This medicine may increase your risk to bruise  or bleed. Call your doctor or health care professional if you notice any unusual bleeding. Be careful brushing and flossing your teeth or using a toothpick because you may get an infection or bleed more easily. If you have any dental work done, tell your dentist you are receiving this medicine. Avoid taking products that contain aspirin, acetaminophen, ibuprofen, naproxen, or ketoprofen unless instructed by your doctor. These medicines may hide a fever. Women should inform their doctor if they wish to become pregnant or think they might be pregnant. There is a potential for serious side effects to an unborn child. Talk to your health care professional or pharmacist for more information. Do not breast-feed an infant while taking this medicine. What side effects may I notice from receiving this medicine? Side effects that you should report to your doctor or health care professional as soon as possible: -allergic reactions like skin rash, itching or hives, swelling of the face, lips, or tongue -low blood counts - this medicine may decrease the number of white blood cells, red blood cells and platelets. You may be at increased risk for infections and bleeding. -signs of infection - fever or chills, cough, sore throat, pain or difficulty passing urine -signs of decreased platelets or bleeding - bruising, pinpoint red spots on the skin, black, tarry stools, blood in the urine -signs of decreased red blood cells - unusually weak or tired, fainting spells, lightheadedness -breathing problems -chest pain -mouth sores -nausea and vomiting -pain, swelling, redness at site where injected -pain, tingling, numbness in the hands or feet -stomach pain -swelling of ankles, feet, hands -unusual bleeding Side effects that usually do not require medical attention (report to your doctor or health care professional if they continue or are bothersome): -constipation -diarrhea -hair loss -loss of appetite -stomach  upset This list may not describe all possible side effects. Call your doctor for medical advice about side effects. You may report side effects to FDA at 1-800-FDA-1088. Where should I keep my medicine? This drug is given in a hospital or clinic and will not be stored at home. NOTE: This sheet is a summary. It may not cover all possible information. If you have questions about this medicine, talk to your doctor, pharmacist, or health care provider.  2018 Elsevier/Gold Standard (2007-10-19 18:45:54)  Nanoparticle Albumin-Bound Paclitaxel injection What is this medicine? NANOPARTICLE ALBUMIN-BOUND PACLITAXEL (Na no PAHR ti kuhl al BYOO muhn-bound PAK li TAX el) is a chemotherapy drug. It targets fast dividing cells, like cancer cells, and causes these cells to die. This medicine is used to treat advanced breast cancer and advanced lung cancer. This medicine may be used for other purposes; ask your health care provider or pharmacist if you have questions. COMMON BRAND NAME(S): Abraxane What should I tell my health care provider before I take this medicine? They need to know if you have any of these conditions: -kidney disease -liver disease -low blood counts, like low platelets, red blood cells, or white blood cells -recent or ongoing radiation therapy -an unusual or allergic reaction to paclitaxel, albumin, other chemotherapy, other medicines, foods, dyes, or preservatives -pregnant or trying to get pregnant -breast-feeding How should I use this medicine? This drug is given as an infusion into a vein. It is administered in a hospital or clinic by a specially  trained health care professional. Talk to your pediatrician regarding the use of this medicine in children. Special care may be needed. Overdosage: If you think you have taken too much of this medicine contact a poison control center or emergency room at once. NOTE: This medicine is only for you. Do not share this medicine with  others. What if I miss a dose? It is important not to miss your dose. Call your doctor or health care professional if you are unable to keep an appointment. What may interact with this medicine? -cyclosporine -diazepam -ketoconazole -medicines to increase blood counts like filgrastim, pegfilgrastim, sargramostim -other chemotherapy drugs like cisplatin, doxorubicin, epirubicin, etoposide, teniposide, vincristine -quinidine -testosterone -vaccines -verapamil Talk to your doctor or health care professional before taking any of these medicines: -acetaminophen -aspirin -ibuprofen -ketoprofen -naproxen This list may not describe all possible interactions. Give your health care provider a list of all the medicines, herbs, non-prescription drugs, or dietary supplements you use. Also tell them if you smoke, drink alcohol, or use illegal drugs. Some items may interact with your medicine. What should I watch for while using this medicine? Your condition will be monitored carefully while you are receiving this medicine. You will need important blood work done while you are taking this medicine. This medicine can cause serious allergic reactions. If you experience allergic reactions like skin rash, itching or hives, swelling of the face, lips, or tongue, tell your doctor or health care professional right away. In some cases, you may be given additional medicines to help with side effects. Follow all directions for their use. This drug may make you feel generally unwell. This is not uncommon, as chemotherapy can affect healthy cells as well as cancer cells. Report any side effects. Continue your course of treatment even though you feel ill unless your doctor tells you to stop. Call your doctor or health care professional for advice if you get a fever, chills or sore throat, or other symptoms of a cold or flu. Do not treat yourself. This drug decreases your body's ability to fight infections. Try to avoid  being around people who are sick. This medicine may increase your risk to bruise or bleed. Call your doctor or health care professional if you notice any unusual bleeding. Be careful brushing and flossing your teeth or using a toothpick because you may get an infection or bleed more easily. If you have any dental work done, tell your dentist you are receiving this medicine. Avoid taking products that contain aspirin, acetaminophen, ibuprofen, naproxen, or ketoprofen unless instructed by your doctor. These medicines may hide a fever. Do not become pregnant while taking this medicine. Women should inform their doctor if they wish to become pregnant or think they might be pregnant. There is a potential for serious side effects to an unborn child. Talk to your health care professional or pharmacist for more information. Do not breast-feed an infant while taking this medicine. Men are advised not to father a child while receiving this medicine. What side effects may I notice from receiving this medicine? Side effects that you should report to your doctor or health care professional as soon as possible: -allergic reactions like skin rash, itching or hives, swelling of the face, lips, or tongue -low blood counts - This drug may decrease the number of white blood cells, red blood cells and platelets. You may be at increased risk for infections and bleeding. -signs of infection - fever or chills, cough, sore throat, pain or difficulty passing  urine -signs of decreased platelets or bleeding - bruising, pinpoint red spots on the skin, black, tarry stools, nosebleeds -signs of decreased red blood cells - unusually weak or tired, fainting spells, lightheadedness -breathing problems -changes in vision -chest pain -high or low blood pressure -mouth sores -nausea and vomiting -pain, swelling, redness or irritation at the injection site -pain, tingling, numbness in the hands or feet -slow or irregular  heartbeat -swelling of the ankle, feet, hands Side effects that usually do not require medical attention (report to your doctor or health care professional if they continue or are bothersome): -aches, pains -changes in the color of fingernails -diarrhea -hair loss -loss of appetite This list may not describe all possible side effects. Call your doctor for medical advice about side effects. You may report side effects to FDA at 1-800-FDA-1088. Where should I keep my medicine? This drug is given in a hospital or clinic and will not be stored at home. NOTE: This sheet is a summary. It may not cover all possible information. If you have questions about this medicine, talk to your doctor, pharmacist, or health care provider.  2018 Elsevier/Gold Standard (2015-04-11 10:05:20)

## 2016-12-08 NOTE — Progress Notes (Signed)
  [facility] OFFICE PROGRESS NOTE   Diagnosis: Pancreas cancer  INTERVAL HISTORY:   She returns as scheduled. She completed a first cycle of gemcitabine/Abraxane on 11/25/2016. No symptom of allergic reaction. No neuropathy symptoms. She reports feeling well. No pain. No pruritus. Good appetite. She had a syncope event upon arriving at the Cancer center today. She reports the blood sugar was "low ", measured at approximately 70 this morning. She last had a syncope event at home 2-3 weeks ago. No complaint at present.  Objective:  Vital signs in last 24 hours:  There were no vitals taken for this visit.    HEENT: No thrush or ulcers Resp: End inspiratory rhonchi at the left posterior base, no respiratory distress Cardio: Regular rate and rhythm GI: No hepatosplenomegaly, nontender, no mass Vascular: No leg edema Neuro: Alert and oriented   Portacath/PICC-without erythema  Lab Results:  Lab Results  Component Value Date   WBC 3.7 (L) 12/08/2016   HGB 9.3 (L) 12/08/2016   HCT 28.1 (L) 12/08/2016   MCV 86.8 12/08/2016   PLT 432 (H) 12/08/2016   NEUTROABS 2.1 12/08/2016    CMP     Component Value Date/Time   NA 140 11/25/2016 1021   K 4.0 11/25/2016 1021   CL 102 11/14/2016 1349   CO2 27 11/25/2016 1021   GLUCOSE 157 (H) 11/25/2016 1021   BUN 18.3 11/25/2016 1021   CREATININE 1.1 11/25/2016 1021   CALCIUM 9.6 11/25/2016 1021   PROT 6.2 (L) 11/25/2016 1021   ALBUMIN 2.4 (L) 11/25/2016 1021   AST 124 (H) 11/25/2016 1021   ALT 74 (H) 11/25/2016 1021   ALKPHOS 947 (H) 11/25/2016 1021   BILITOT 1.03 11/25/2016 1021   GFRNONAA 38 (L) 11/14/2016 1349   GFRAA 44 (L) 11/14/2016 1349     Medications: I have reviewed the patient's current medications.  Assessment/Plan: 1. Adenocarcinoma the pancreas-pancreas head/uncinatemass noted on a CT abdomen/pelvis 09/20/2016, status post an EUS biopsy 10/09/2016 confirming adenocarcinoma  CT  09/20/2016-pancreas head/uncinatemass, possible contact with the superior mesenteric artery, biliary dilatation, small low lung nodules  CTs chest, abdomen, and pelvis 10/27/2016-enlarging pancreas head mass with vascular abutment, no evidence of metastatic disease  Cycle 1 gemcitabine/Abraxane 11/25/2016  2. Biliary obstruction secondary to #1, status post placement of a wall stent and external biliary drain 10/09/2016  Biliary drain removed 10/20/2016  3. Diabetes-admitted with marked hyperglycemia 10/27/2016  4. History of squamous cell carcinoma of the scalp  5. Lumbar fusion surgery February 2018  6. 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 History of Abdominal/back pain secondary to pancreas cancer  8. Cholangitis 10/07/2016  9. Confusionduring hospital admission 10/27/2016-etiology unclear, MRI brain negative for metastatic disease on 10/29/2016, improved  10. Port-A-Cath placement 11/19/2016     Disposition:  She tolerated the first cycle of chemotherapy well. The plan is to proceed with cycle 2 today. We will check the CA 19-9 after this cycle.  The etiology of the syncope event today is unclear. This may have been related to hypoglycemia, the blood sugar returned at 140 here. We will follow-up on the chemistry panel prior to chemotherapy today.  She will return for an office visit and chemotherapy in 2 weeks.  I discussed the situation with her son. He reports she has been well at home.  Patricia Romberg, MD  12/08/2016  8:55 AM

## 2016-12-08 NOTE — Progress Notes (Signed)
As patient was walking into main entrance of [facility], she claims she became dizzy.  States she had a syncopal episode that resulted in an unwitnessed fall between the door and the greeter's desk. Denies any pain/injury. Wilma (front desk) was the first person to reach patient. Wilma states patient was awake and alert when she arrived. Patient was awake, alert, and oriented when this RN arrived. Assisted to wheelchair without difficulty. Brought patient to treatment area. Orange juice given to patient because she claimed she felt her blood sugar was low. Patient took a small sip of orange juice and immediately vomited x 1. Dr. Benay Spice came to treatment room to see patient. No additional orders received.

## 2016-12-09 DIAGNOSIS — M797 Fibromyalgia: Secondary | ICD-10-CM | POA: Diagnosis not present

## 2016-12-09 DIAGNOSIS — E785 Hyperlipidemia, unspecified: Secondary | ICD-10-CM | POA: Diagnosis not present

## 2016-12-09 DIAGNOSIS — D631 Anemia in chronic kidney disease: Secondary | ICD-10-CM | POA: Diagnosis not present

## 2016-12-09 DIAGNOSIS — I129 Hypertensive chronic kidney disease with stage 1 through stage 4 chronic kidney disease, or unspecified chronic kidney disease: Secondary | ICD-10-CM | POA: Diagnosis not present

## 2016-12-09 DIAGNOSIS — Z981 Arthrodesis status: Secondary | ICD-10-CM | POA: Diagnosis not present

## 2016-12-09 DIAGNOSIS — E1165 Type 2 diabetes mellitus with hyperglycemia: Secondary | ICD-10-CM | POA: Diagnosis not present

## 2016-12-09 DIAGNOSIS — E1142 Type 2 diabetes mellitus with diabetic polyneuropathy: Secondary | ICD-10-CM | POA: Diagnosis not present

## 2016-12-09 DIAGNOSIS — N183 Chronic kidney disease, stage 3 (moderate): Secondary | ICD-10-CM | POA: Diagnosis not present

## 2016-12-09 DIAGNOSIS — C250 Malignant neoplasm of head of pancreas: Secondary | ICD-10-CM | POA: Diagnosis not present

## 2016-12-09 DIAGNOSIS — Z96652 Presence of left artificial knee joint: Secondary | ICD-10-CM | POA: Diagnosis not present

## 2016-12-12 ENCOUNTER — Telehealth: Payer: Self-pay | Admitting: Oncology

## 2016-12-12 DIAGNOSIS — D631 Anemia in chronic kidney disease: Secondary | ICD-10-CM | POA: Diagnosis not present

## 2016-12-12 DIAGNOSIS — E785 Hyperlipidemia, unspecified: Secondary | ICD-10-CM | POA: Diagnosis not present

## 2016-12-12 DIAGNOSIS — E1165 Type 2 diabetes mellitus with hyperglycemia: Secondary | ICD-10-CM | POA: Diagnosis not present

## 2016-12-12 DIAGNOSIS — E1142 Type 2 diabetes mellitus with diabetic polyneuropathy: Secondary | ICD-10-CM | POA: Diagnosis not present

## 2016-12-12 DIAGNOSIS — I129 Hypertensive chronic kidney disease with stage 1 through stage 4 chronic kidney disease, or unspecified chronic kidney disease: Secondary | ICD-10-CM | POA: Diagnosis not present

## 2016-12-12 DIAGNOSIS — Z981 Arthrodesis status: Secondary | ICD-10-CM | POA: Diagnosis not present

## 2016-12-12 DIAGNOSIS — N183 Chronic kidney disease, stage 3 (moderate): Secondary | ICD-10-CM | POA: Diagnosis not present

## 2016-12-12 DIAGNOSIS — C250 Malignant neoplasm of head of pancreas: Secondary | ICD-10-CM | POA: Diagnosis not present

## 2016-12-12 DIAGNOSIS — M797 Fibromyalgia: Secondary | ICD-10-CM | POA: Diagnosis not present

## 2016-12-12 DIAGNOSIS — Z96652 Presence of left artificial knee joint: Secondary | ICD-10-CM | POA: Diagnosis not present

## 2016-12-12 NOTE — Telephone Encounter (Signed)
Appointments scheduled and confirmed with patient, per 12/08/16 los.

## 2016-12-21 ENCOUNTER — Other Ambulatory Visit: Payer: Self-pay | Admitting: Oncology

## 2016-12-22 ENCOUNTER — Ambulatory Visit (HOSPITAL_BASED_OUTPATIENT_CLINIC_OR_DEPARTMENT_OTHER): Payer: Medicare Other

## 2016-12-22 ENCOUNTER — Ambulatory Visit: Payer: Medicare Other

## 2016-12-22 ENCOUNTER — Telehealth: Payer: Self-pay | Admitting: Nurse Practitioner

## 2016-12-22 ENCOUNTER — Encounter (HOSPITAL_COMMUNITY): Payer: Self-pay | Admitting: Emergency Medicine

## 2016-12-22 ENCOUNTER — Telehealth: Payer: Self-pay | Admitting: Oncology

## 2016-12-22 ENCOUNTER — Other Ambulatory Visit (HOSPITAL_BASED_OUTPATIENT_CLINIC_OR_DEPARTMENT_OTHER): Payer: Medicare Other

## 2016-12-22 ENCOUNTER — Ambulatory Visit (HOSPITAL_BASED_OUTPATIENT_CLINIC_OR_DEPARTMENT_OTHER): Payer: Medicare Other | Admitting: Oncology

## 2016-12-22 VITALS — BP 132/58 | HR 78 | Temp 98.0°F | Resp 20 | Ht 63.0 in | Wt 177.1 lb

## 2016-12-22 DIAGNOSIS — Z85828 Personal history of other malignant neoplasm of skin: Secondary | ICD-10-CM

## 2016-12-22 DIAGNOSIS — C259 Malignant neoplasm of pancreas, unspecified: Secondary | ICD-10-CM

## 2016-12-22 DIAGNOSIS — E1122 Type 2 diabetes mellitus with diabetic chronic kidney disease: Secondary | ICD-10-CM

## 2016-12-22 DIAGNOSIS — C258 Malignant neoplasm of overlapping sites of pancreas: Secondary | ICD-10-CM

## 2016-12-22 DIAGNOSIS — N183 Chronic kidney disease, stage 3 (moderate): Secondary | ICD-10-CM

## 2016-12-22 DIAGNOSIS — M797 Fibromyalgia: Secondary | ICD-10-CM | POA: Diagnosis not present

## 2016-12-22 DIAGNOSIS — E1165 Type 2 diabetes mellitus with hyperglycemia: Secondary | ICD-10-CM | POA: Diagnosis not present

## 2016-12-22 DIAGNOSIS — Z79899 Other long term (current) drug therapy: Secondary | ICD-10-CM | POA: Insufficient documentation

## 2016-12-22 DIAGNOSIS — R509 Fever, unspecified: Secondary | ICD-10-CM | POA: Diagnosis not present

## 2016-12-22 DIAGNOSIS — R9431 Abnormal electrocardiogram [ECG] [EKG]: Secondary | ICD-10-CM | POA: Diagnosis not present

## 2016-12-22 DIAGNOSIS — A419 Sepsis, unspecified organism: Secondary | ICD-10-CM | POA: Diagnosis not present

## 2016-12-22 DIAGNOSIS — I517 Cardiomegaly: Secondary | ICD-10-CM | POA: Diagnosis not present

## 2016-12-22 DIAGNOSIS — Z8507 Personal history of malignant neoplasm of pancreas: Secondary | ICD-10-CM

## 2016-12-22 DIAGNOSIS — K219 Gastro-esophageal reflux disease without esophagitis: Secondary | ICD-10-CM | POA: Diagnosis not present

## 2016-12-22 DIAGNOSIS — Z95828 Presence of other vascular implants and grafts: Secondary | ICD-10-CM | POA: Insufficient documentation

## 2016-12-22 DIAGNOSIS — Z5111 Encounter for antineoplastic chemotherapy: Secondary | ICD-10-CM | POA: Diagnosis not present

## 2016-12-22 DIAGNOSIS — Z794 Long term (current) use of insulin: Secondary | ICD-10-CM

## 2016-12-22 DIAGNOSIS — I129 Hypertensive chronic kidney disease with stage 1 through stage 4 chronic kidney disease, or unspecified chronic kidney disease: Secondary | ICD-10-CM

## 2016-12-22 DIAGNOSIS — D630 Anemia in neoplastic disease: Secondary | ICD-10-CM | POA: Diagnosis not present

## 2016-12-22 DIAGNOSIS — M545 Low back pain: Secondary | ICD-10-CM | POA: Insufficient documentation

## 2016-12-22 DIAGNOSIS — G8929 Other chronic pain: Secondary | ICD-10-CM | POA: Diagnosis not present

## 2016-12-22 DIAGNOSIS — C250 Malignant neoplasm of head of pancreas: Secondary | ICD-10-CM

## 2016-12-22 DIAGNOSIS — I10 Essential (primary) hypertension: Secondary | ICD-10-CM | POA: Diagnosis not present

## 2016-12-22 DIAGNOSIS — R000 Tachycardia, unspecified: Secondary | ICD-10-CM | POA: Diagnosis not present

## 2016-12-22 DIAGNOSIS — R6510 Systemic inflammatory response syndrome (SIRS) of non-infectious origin without acute organ dysfunction: Secondary | ICD-10-CM | POA: Diagnosis not present

## 2016-12-22 LAB — COMPREHENSIVE METABOLIC PANEL
ALBUMIN: 2.3 g/dL — AB (ref 3.5–5.0)
ALK PHOS: 532 U/L — AB (ref 40–150)
ALT: 24 U/L (ref 0–55)
AST: 29 U/L (ref 5–34)
Anion Gap: 8 mEq/L (ref 3–11)
BUN: 17 mg/dL (ref 7.0–26.0)
CALCIUM: 9.4 mg/dL (ref 8.4–10.4)
CO2: 28 mEq/L (ref 22–29)
CREATININE: 1.2 mg/dL — AB (ref 0.6–1.1)
Chloride: 102 mEq/L (ref 98–109)
EGFR: 45 mL/min/{1.73_m2} — ABNORMAL LOW (ref >90)
Glucose: 163 mg/dl — ABNORMAL HIGH (ref 70–140)
POTASSIUM: 4 meq/L (ref 3.5–5.1)
Sodium: 137 mEq/L (ref 136–145)
Total Bilirubin: 0.36 mg/dL (ref 0.20–1.20)
Total Protein: 5.9 g/dL — ABNORMAL LOW (ref 6.4–8.3)

## 2016-12-22 LAB — CBC WITH DIFFERENTIAL/PLATELET
BASO%: 1.1 % (ref 0.0–2.0)
Basophils Absolute: 0.1 10*3/uL (ref 0.0–0.1)
EOS ABS: 0.1 10*3/uL (ref 0.0–0.5)
EOS%: 1.2 % (ref 0.0–7.0)
HEMATOCRIT: 27.1 % — AB (ref 34.8–46.6)
HGB: 9 g/dL — ABNORMAL LOW (ref 11.6–15.9)
LYMPH#: 1.5 10*3/uL (ref 0.9–3.3)
LYMPH%: 20.3 % (ref 14.0–49.7)
MCH: 28 pg (ref 25.1–34.0)
MCHC: 33.2 g/dL (ref 31.5–36.0)
MCV: 84.4 fL (ref 79.5–101.0)
MONO#: 1 10*3/uL — AB (ref 0.1–0.9)
MONO%: 13.7 % (ref 0.0–14.0)
NEUT%: 63.7 % (ref 38.4–76.8)
NEUTROS ABS: 4.6 10*3/uL (ref 1.5–6.5)
PLATELETS: 311 10*3/uL (ref 145–400)
RBC: 3.21 10*6/uL — AB (ref 3.70–5.45)
RDW: 15.3 % — ABNORMAL HIGH (ref 11.2–14.5)
WBC: 7.2 10*3/uL (ref 3.9–10.3)

## 2016-12-22 MED ORDER — SODIUM CHLORIDE 0.9 % IV SOLN
Freq: Once | INTRAVENOUS | Status: AC
  Administered 2016-12-22: 11:00:00 via INTRAVENOUS

## 2016-12-22 MED ORDER — HEPARIN SOD (PORK) LOCK FLUSH 100 UNIT/ML IV SOLN
500.0000 [IU] | Freq: Once | INTRAVENOUS | Status: AC | PRN
  Administered 2016-12-22: 500 [IU]
  Filled 2016-12-22: qty 5

## 2016-12-22 MED ORDER — PROCHLORPERAZINE MALEATE 10 MG PO TABS
ORAL_TABLET | ORAL | Status: AC
  Filled 2016-12-22: qty 1

## 2016-12-22 MED ORDER — OXYCODONE-ACETAMINOPHEN 5-325 MG PO TABS
ORAL_TABLET | ORAL | Status: AC
  Filled 2016-12-22: qty 1

## 2016-12-22 MED ORDER — PACLITAXEL PROTEIN-BOUND CHEMO INJECTION 100 MG
100.0000 mg/m2 | Freq: Once | INTRAVENOUS | Status: AC
  Administered 2016-12-22: 200 mg via INTRAVENOUS
  Filled 2016-12-22: qty 40

## 2016-12-22 MED ORDER — SODIUM CHLORIDE 0.9 % IV SOLN
800.0000 mg/m2 | Freq: Once | INTRAVENOUS | Status: AC
  Administered 2016-12-22: 1520 mg via INTRAVENOUS
  Filled 2016-12-22: qty 39.98

## 2016-12-22 MED ORDER — OXYCODONE-ACETAMINOPHEN 5-325 MG PO TABS
1.0000 | ORAL_TABLET | Freq: Once | ORAL | Status: AC
Start: 2016-12-22 — End: 2016-12-22
  Administered 2016-12-22: 1 via ORAL

## 2016-12-22 MED ORDER — SODIUM CHLORIDE 0.9% FLUSH
10.0000 mL | INTRAVENOUS | Status: DC | PRN
  Administered 2016-12-22: 10 mL
  Filled 2016-12-22: qty 10

## 2016-12-22 MED ORDER — SODIUM CHLORIDE 0.9% FLUSH
10.0000 mL | Freq: Once | INTRAVENOUS | Status: AC
  Administered 2016-12-22: 10 mL
  Filled 2016-12-22: qty 10

## 2016-12-22 MED ORDER — PROCHLORPERAZINE MALEATE 10 MG PO TABS
10.0000 mg | ORAL_TABLET | Freq: Once | ORAL | Status: AC
  Administered 2016-12-22: 10 mg via ORAL

## 2016-12-22 NOTE — Telephone Encounter (Signed)
Returned patient's phone call stating that she had back surgery in February.  Patient was sitting at computer chair and turned neck slightly to the right.  Patient states that she heard a pop and has had low back pain with no relief from Hydrocodone 7.5-325 every 4 hours.  Appt given to be seen 07/03 with MMM at 10 am.  Patient's husband then got on the phone wanting to know why it took a hour to receive a phone call back.  Informed husband that we have been seeing patient's and I was not able to get to phone call until now. Apologized to patient and husband.  Husband then states very rudely that he will be talking to [facility] and hangs up the phone

## 2016-12-22 NOTE — Patient Instructions (Signed)
[facility] Discharge Instructions for Patients Receiving Chemotherapy  Today you received the following chemotherapy agents: Abraxane and Gemzar   To help prevent nausea and vomiting after your treatment, we encourage you to take your nausea medication as directed.    If you develop nausea and vomiting that is not controlled by your nausea medication, call the clinic.   BELOW ARE SYMPTOMS THAT SHOULD BE REPORTED IMMEDIATELY:  *FEVER GREATER THAN 100.5 F  *CHILLS WITH OR WITHOUT FEVER  NAUSEA AND VOMITING THAT IS NOT CONTROLLED WITH YOUR NAUSEA MEDICATION  *UNUSUAL SHORTNESS OF BREATH  *UNUSUAL BRUISING OR BLEEDING  TENDERNESS IN MOUTH AND THROAT WITH OR WITHOUT PRESENCE OF ULCERS  *URINARY PROBLEMS  *BOWEL PROBLEMS  UNUSUAL RASH Items with * indicate a potential emergency and should be followed up as soon as possible.  Feel free to call the clinic you have any questions or concerns. The clinic phone number is [phone].  Please show the CHEMO ALERT CARD at check-in to the Emergency Department and triage nurse.   

## 2016-12-22 NOTE — ED Triage Notes (Signed)
Pt. " felt a pop" at lower back while sitting on a chair 2 weeks ago reports persistent low back pain unrelieved by prescription pain medication , she is hypertensive at arrival - Dr. Stark Jock notified .

## 2016-12-22 NOTE — Telephone Encounter (Signed)
Appointments scheduled per 12/22/16 los. °Patient was given a copy of the AVS report and appointment schedule, per 12/22/16 los. °

## 2016-12-22 NOTE — Progress Notes (Signed)
  [facility] OFFICE PROGRESS NOTE   Diagnosis: Pancreas cancer  INTERVAL HISTORY:   [name] returns as scheduled. She completed another treatment with gemcitabine/Abraxane on 12/08/2016. She reports hair loss. No other side effect of chemotherapy. No pruritus. No abdominal pain. She has chronic low back pain. Her activity level remains limited. Good appetite.  Objective:  Vital signs in last 24 hours:  Blood pressure (!) 132/58, pulse 78, temperature 98 F (36.7 C), temperature source Oral, resp. rate 20, height 5\' 3"  (1.6 m), weight 177 lb 1.6 oz (80.3 kg), SpO2 95 %.    HEENT: No thrush or ulcers Resp: Lungs clear bilaterally Cardio: Regular rate and rhythm GI: No hepatomegaly, no mass, nontender Vascular: No leg edema Neuro: Alert and oriented     Portacath/PICC-without erythema  Lab Results:  Lab Results  Component Value Date   WBC 7.2 12/22/2016   HGB 9.0 (L) 12/22/2016   HCT 27.1 (L) 12/22/2016   MCV 84.4 12/22/2016   PLT 311 12/22/2016   NEUTROABS 4.6 12/22/2016    CMP     Component Value Date/Time   NA 137 12/22/2016 0943   K 4.0 12/22/2016 0943   CL 102 11/14/2016 1349   CO2 28 12/22/2016 0943   GLUCOSE 163 (H) 12/22/2016 0943   BUN 17.0 12/22/2016 0943   CREATININE 1.2 (H) 12/22/2016 0943   CALCIUM 9.4 12/22/2016 0943   PROT 5.9 (L) 12/22/2016 0943   ALBUMIN 2.3 (L) 12/22/2016 0943   AST 29 12/22/2016 0943   ALT 24 12/22/2016 0943   ALKPHOS 532 (H) 12/22/2016 0943   BILITOT 0.36 12/22/2016 0943   GFRNONAA 38 (L) 11/14/2016 1349   GFRAA 44 (L) 11/14/2016 1349    Medications: I have reviewed the patient's current medications.  Assessment/Plan: 1. Adenocarcinoma the pancreas-pancreas head/uncinatemass noted on a CT abdomen/pelvis 09/20/2016, status post an EUS biopsy 10/09/2016 confirming adenocarcinoma  CT 09/20/2016-pancreas head/uncinatemass, possible contact with the superior mesenteric artery, biliary dilatation, small  low lung nodules  CTs chest, abdomen, and pelvis 10/27/2016-enlarging pancreas head mass with vascular abutment, no evidence of metastatic disease  Cycle 1 gemcitabine/Abraxane 11/25/2016  Cycle 2 gemcitabine/Abraxane 12/08/2016  Cycle 3 gemcitabine/Abraxane 12/22/2016  2. Biliary obstruction secondary to #1, status post placement of a wall stent and external biliary drain 10/09/2016  Biliary drain removed 10/20/2016  3. Diabetes-admitted with marked hyperglycemia 10/27/2016  4. History of squamous cell carcinoma of the scalp  5. Lumbar fusion surgery February 2018  6. 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 History of Abdominal/back pain secondary to pancreas cancer  8. Cholangitis 10/07/2016  9. Confusionduring hospital admission 10/27/2016-etiology unclear, MRI brain negative for metastatic disease on 10/29/2016, improved  10. Port-A-Cath placement 11/19/2016    Disposition:[name] appears stable. She is tolerating the chemotherapy well with the plans to proceed with another cycle of gemcitabine/Abraxane today. The plan is to continue gemcitabine/Abraxane on a 2 week schedule. She will complete 6 treatments prior to a restaging CT evaluation. We will follow-up on the CA 19-9 from today.  She will return for an office visit and chemotherapy in 2 weeks.    Donneta Romberg, MD  12/22/2016  10:37 AM

## 2016-12-23 ENCOUNTER — Emergency Department (HOSPITAL_COMMUNITY): Payer: Medicare Other

## 2016-12-23 ENCOUNTER — Encounter: Payer: Self-pay | Admitting: Nurse Practitioner

## 2016-12-23 ENCOUNTER — Ambulatory Visit (INDEPENDENT_AMBULATORY_CARE_PROVIDER_SITE_OTHER): Payer: Medicare Other | Admitting: Nurse Practitioner

## 2016-12-23 ENCOUNTER — Emergency Department (HOSPITAL_COMMUNITY)
Admission: EM | Admit: 2016-12-23 | Discharge: 2016-12-23 | Disposition: A | Discharge status: Home / Self Care | Payer: Medicare Other | Source: Home / Self Care | Attending: Emergency Medicine | Admitting: Emergency Medicine

## 2016-12-23 VITALS — BP 158/77 | HR 96 | Temp 99.1°F | Ht 63.0 in | Wt 180.0 lb

## 2016-12-23 DIAGNOSIS — M545 Low back pain, unspecified: Secondary | ICD-10-CM

## 2016-12-23 LAB — CANCER ANTIGEN 19-9: CA 19-9: 5714 U/mL — ABNORMAL HIGH (ref 0–35)

## 2016-12-23 MED ORDER — OXYCODONE-ACETAMINOPHEN 5-325 MG PO TABS
1.0000 | ORAL_TABLET | Freq: Once | ORAL | Status: AC
  Administered 2016-12-23: 1 via ORAL
  Filled 2016-12-23: qty 1

## 2016-12-23 MED ORDER — OXYCODONE-ACETAMINOPHEN 5-325 MG PO TABS: 1.0000 | ORAL_TABLET | Freq: Four times a day (QID) | ORAL | 0 refills | Status: DC | PRN

## 2016-12-23 MED ORDER — OXYCODONE-ACETAMINOPHEN 5-325 MG PO TABS: 1.0000 | ORAL_TABLET | Freq: Two times a day (BID) | ORAL | 0 refills | Status: DC

## 2016-12-23 NOTE — Telephone Encounter (Signed)
patoent was seen today

## 2016-12-23 NOTE — Progress Notes (Signed)
   Subjective:    Patient ID: Patricia Austin, female    DOB: 1946/06/13, 71 y.o.   MRN: [id number]  HPI Patient brought in today by her husband with c/o back pain. She is currently under going chemo for pancreatic cancer. She has had 3 rounds and is suppose to have 2 more and then they will reevaluate. SHe had back surgery in February and was doing well. Twisted her back 2 weeks ago and felt a pop and has been hurting every since. Rate [place] 7/10 right now. Her husband took her  To er when happened and they did xrays which looked normal. She was given percocet which relieved her pain. Hydrocodone she had at home does not help.    Review of Systems  Constitutional: Negative.   Respiratory: Negative.   Cardiovascular: Negative.   Musculoskeletal: Positive for back pain.  Neurological: Negative.   Psychiatric/Behavioral: Negative.   All other systems reviewed and are negative.      Objective:   Physical Exam  Constitutional: She is oriented to person, place, and time. She appears well-developed and well-nourished. No distress.  Cardiovascular: Normal rate.   Pulmonary/Chest: Effort normal.  Musculoskeletal:  FROM of lumbar spine with pain on flexion an dextension (-) SLR bil Motor strength and sensation distally intact  Neurological: She is alert and oriented to person, place, and time.  Skin: Skin is warm.  Psychiatric: She has a normal mood and affect. Her behavior is normal. Judgment and thought content normal.   BP (!) 158/77   Pulse 96   Temp 99.1 F (37.3 C) (Oral)   Ht 5\' 3"  (1.6 m)   Wt 180 lb (81.6 kg)   BMI 31.89 kg/m       Assessment & Plan:  1. Acute right-sided low back pain without sciatica *let oncologist know about back pain Follow up prn - oxyCODONE-acetaminophen (PERCOCET/ROXICET) 5-325 MG tablet; Take 1-2 tablets by mouth 2 (two) times daily.  Dispense: 60 tablet; Refill: 0  Mary-Margaret Hassell Done, FNP

## 2016-12-23 NOTE — Patient Instructions (Signed)

## 2016-12-23 NOTE — Discharge Instructions (Signed)
Please stop taking your hydrocodone (also known as Vicodin or Norco or Lortab).  You may begin taking Percocet instead for pain control. I recommend close follow-up with your primary care provider and neurosurgeon.

## 2016-12-23 NOTE — ED Provider Notes (Signed)
TIME SEEN: 1:20 AM  By signing my name below, I, Margit Banda, attest that this documentation has been prepared under the direction and in the presence of Ward, Delice Bison, DO. Electronically Signed: Margit Banda, ED Scribe. 12/23/16. 1:29 AM.  CHIEF COMPLAINT: Back Pain  HPI: Patricia Austin is a 71 y.o. female with a PMHx of pancreatic cancer and chronic lower back pain who presents to the Emergency Department complaining of persistent, achy, lower back pain that started ~ 2 weeks ago. Pt reports turning her head to the right while sitting in a chair and feeling a pop/snap in her lower back. On 08/20/16 pt had lumbar fusion of L3-L4 and L4-L5 with Dr. Cyndy Freeze. States pain is keeping her from sleeping at night. Able to ambulate without difficulty. No modifying factors noted. Pain is unrelieved by her prescription pain medication (Vicodin). Had chemotherapy Monday morning (12/22/16). Pt denies fever, chills, numbness, tingling, focal weakness, bladder and bowel incontinence, urinary retention, abdominal pain, vomiting, diarrhea, chest pressure is breath, cough and appetite changes.   Per patient's chart, she has an appointment with her primary care provider at 10 AM in the morning.   08/20/16: PROCEDURE:  Procedure(s): POSTERIOR LUMBAR INTERBODY FUSION( Synthes Cages, with autograft morsels) LUMBAR THREE-FOUR, LUMBAR FOUR-FIVE Lumbar decompression beyond the needed exposure for a plif of the L3, and L5 nerve roots Posterolateral arthrodesis L3-5 autograft morsels Posterior segmental pedicle screw fixation L3-5 (Nuvasive mas plif)   ROS: See HPI Constitutional: no fever  Eyes: no drainage  ENT: no runny nose   Cardiovascular:  no chest pain  Resp: no SOB  GI: no vomiting GU: no dysuria Integumentary: no rash  Allergy: no hives  Musculoskeletal: no leg swelling  Neurological: no slurred speech ROS otherwise negative  PAST MEDICAL HISTORY/PAST SURGICAL HISTORY:  Past Medical History:   Diagnosis Date  . Anxiety    "not all the time"   . Chronic lower back pain   . CKD (chronic kidney disease) stage 3, GFR 30-59 ml/min   . Complication of anesthesia 2002   difficulty /w intubation, with plastic surgery , because she was feeling so anxious. 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 .   Pt. reports that she  is very sensitive to med. & is hard  to wake up.  . Depression    "not all the time"   . Difficult intubation 2002   with head surgery 2002 has done ok with later intubations  . Dysrhythmia    told that she sometimes has extra beats. Does not see cardiologist, had a stress test many yrs. ago>10 yrs. ...update 11-14-16 " i havent had that feelign in a long time "  . Esophageal dysmotility    "getting worse recenly" (12/15/2013)  . Fibromyalgia   . Hypertension   . Nephritis ~ 1955  . Osteoarthritis    knees   . Pancreatic cancer ([place])   . Pancreatic mass   . Renal insufficiency   . Seasonal allergies    "in the spring"  . Shortness of breath    walking long distances   . squamous cell carcinoma scalp    squamous, on scalp, & has had several surgeries on scalp, for plastics repair of scalp   . Type II diabetes mellitus (H[facility])     MEDICATIONS:  Prior to Admission medications   Medication Sig Start Date End Date Taking? Authorizing Provider  amLODipine (NORVASC) 5 MG tablet Take 1 tablet (5 mg total) by mouth daily. 11/24/16  Hassell Done, Mary-Margaret, FNP  carvedilol (COREG) 25 MG tablet TAKE  (1)  TABLET TWICE A DAY WITH MEALS (BREAKFAST AND SUPPER) Patient taking differently: Take 25 mg by mouth 2 (two) times daily with a meal.  07/18/16   Hassell Done, Mary-Margaret, FNP  DULoxetine (CYMBALTA) 60 MG capsule Take 1 capsule (60 mg total) by mouth 2 (two) times daily. 07/18/16   Hassell Done, Mary-Margaret, FNP  glucose blood (ONE TOUCH ULTRA TEST) test strip Test up to qid. DX:E11.65 11/13/16   Hassell Done Mary-Margaret, FNP  HYDROcodone-acetaminophen (NORCO) 7.5-325 MG tablet  Take 1-2 tablets by mouth every 6 (six) hours as needed for moderate pain. 10/16/16   Pyrtle, Lajuan Lines, MD  Insulin Glargine (TOUJEO SOLOSTAR) 300 UNIT/ML SOPN Inject 40 Units into the skin daily. 11/24/16   Hassell Done, Mary-Margaret, FNP  insulin lispro (HUMALOG) 100 UNIT/ML injection Up to 15u with each meal 11/24/16   Hassell Done, Mary-Margaret, FNP  lidocaine-prilocaine (EMLA) cream Apply to port site one hour prior to use. Do not rub in. Cover with plastic. 11/10/16   Ladell Pier, MD  ondansetron (ZOFRAN) 8 MG tablet Take 1 tablet (8 mg total) by mouth every 8 (eight) hours as needed for nausea or vomiting. 11/10/16   Ladell Pier, MD  pantoprazole (PROTONIX) 40 MG tablet Take 1 tablet (40 mg total) by mouth daily before breakfast. 09/26/16   Gatha Mayer, MD  risperiDONE (RISPERDAL) 0.25 MG tablet Take 1 tablet (0.25 mg total) by mouth 2 (two) times daily. Patient not taking: Reported on 12/22/2016 12/01/16   Sharion Balloon, FNP    ALLERGIES:  No Known Allergies  SOCIAL HISTORY:  Social History  Substance Use Topics  . Smoking status: Never Smoker  . Smokeless tobacco: Never Used  . Alcohol use No    FAMILY HISTORY: Family History  Problem Relation Age of Onset  . Early death Mother   . Thrombosis Father     EXAM: BP (!) 182/78   Pulse (!) 111   Temp 99 F (37.2 C) (Oral)   Resp 16   Ht 5\' 3"  (1.6 m)   Wt 177 lb (80.3 kg)   SpO2 93%   BMI 31.35 kg/m   CONSTITUTIONAL: Alert and oriented and responds appropriately to questions. Elderly; chronically ill appearing HEAD: Normocephalic EYES: Conjunctivae clear, pupils appear equal, EOMI ENT: normal nose; moist mucous membranes NECK: Supple, no meningismus, no nuchal rigidity, no LAD  CARD: Regular and minimally tachycardic; S1 and S2 appreciated; no murmurs, no clicks, no rubs, no gallops RESP: Normal chest excursion without splinting or tachypnea; breath sounds clear and equal bilaterally; no wheezes, no rhonchi, no rales, no  hypoxia or respiratory distress, speaking full sentences ABD/GI: Normal bowel sounds; non-distended; soft, non-tender, no rebound, no guarding, no peritoneal signs, no hepatosplenomegaly BACK:  The back appears normal and there is tenderness to palpation to mid to lower back without step-off or deformity, she has a surgical scar noted to the midline of the lumbar spine, there is no CVA tenderness EXT: Normal ROM in all joints; non-tender to palpation; no edema; normal capillary refill; no cyanosis, no calf tenderness or swelling    SKIN: Normal color for age and race; warm; no rash NEURO: Moves all extremities equally; Strength 5/5 in all four extremities.  Normal sensation diffusely.  2+ deep tendon reflexes in bilateral upper and lower extremity is in no clonus. No saddle anesthesia. Normal speech.  Normal gait. PSYCH: The patient's mood and manner are appropriate. Grooming and personal hygiene  are appropriate.  MEDICAL DECISION MAKING: Patient here with complaints of back pain has been ongoing for 2 weeks. Tender over the lower lumbar spine. She has previously had surgery to this area with hardware. Will obtain x-ray for further evaluation. Nothing at this time to suggest that she needs an emergent MRI. She did receive 1 Percocet in the waiting room and states she is feeling better. We'll give another Percocet for continued pain control. Nothing at this time to suggest cauda equina, epidural abscess or hematoma, discitis, transverse myelitis, demyelinating disorder, spinal stenosis. I do not feel she needs an emergent MRI.  ED PROGRESS:   2:32 AM  Pt reports feeling much better. Her x-ray shows no hardware complication or acute abnormality. Recommended close follow-up with her neurosurgeon Dr. Cyndy Freeze as well as her primary care provider Hassell Done, NP - she has appointment at 10 AM today. Will provide with prescription for Percocet for pain control. I have advised her not to take this medication with her  Vicodin. Patient verbalizes understanding and is comfortable with this plan. We have discussed at length return precautions.  D/w pt that if she develops neurologic deficits, bowel or bladder incontinence, urinary retention, fever, She needs to return to the emergency department immediately.    At this time, I do not feel there is any life-threatening condition present. I have reviewed and discussed all results (EKG, imaging, lab, urine as appropriate) and exam findings with patient/family. I have reviewed nursing notes and appropriate previous records.  I feel the patient is safe to be discharged home without further emergent workup and can continue workup as an outpatient as needed. Discussed usual and customary return precautions. Patient/family verbalize understanding and are comfortable with this plan.  Outpatient follow-up has been provided if needed. All questions have been answered.    2:45 AM  Prior to discharge, it was noted that patient did have a oral temperature of 99. I have recommended a rectal temperature for further evaluation given she is on chemotherapy and discussed with her and her husband that I am concerned that she could have infection given she is immunocompromised. At this time she does not want further workup. She does not want a rectal temperature to be performed and does not want labs, urine, chest x-ray or further imaging of her back. She understands without further workup we could be missing life-threatening illness. She states she is feeling well and has not had any fevers at home and her husband reports that they are only told to worry about fevers of 102 or higher measured orally. She states that she plans to follow-up with her primary care physician at 10 AM. We have discussed again at length return precautions and symptoms to look out for at home. They're comfortable with this plan. At this time I do not feel we need to have the patient sign out [place].  They seem very reliable and have good follow-up scheduled.   I personally performed the services described in this documentation, which was scribed in my presence. The recorded information has been reviewed and is accurate.     Ward, Delice Bison, DO 12/23/16 [phone]

## 2016-12-24 ENCOUNTER — Other Ambulatory Visit: Payer: Self-pay

## 2016-12-24 ENCOUNTER — Inpatient Hospital Stay (HOSPITAL_COMMUNITY)
Admission: EM | Admit: 2016-12-24 | Discharge: 2016-12-26 | DRG: 864 | Disposition: A | Discharge status: Home / Self Care | Payer: Medicare Other | Attending: Internal Medicine | Admitting: Internal Medicine

## 2016-12-24 ENCOUNTER — Encounter (HOSPITAL_COMMUNITY): Payer: Self-pay | Admitting: *Deleted

## 2016-12-24 DIAGNOSIS — Z90710 Acquired absence of both cervix and uterus: Secondary | ICD-10-CM

## 2016-12-24 DIAGNOSIS — A419 Sepsis, unspecified organism: Secondary | ICD-10-CM

## 2016-12-24 DIAGNOSIS — R6510 Systemic inflammatory response syndrome (SIRS) of non-infectious origin without acute organ dysfunction: Secondary | ICD-10-CM | POA: Diagnosis present

## 2016-12-24 DIAGNOSIS — C259 Malignant neoplasm of pancreas, unspecified: Secondary | ICD-10-CM | POA: Diagnosis present

## 2016-12-24 DIAGNOSIS — I10 Essential (primary) hypertension: Secondary | ICD-10-CM | POA: Diagnosis present

## 2016-12-24 DIAGNOSIS — D630 Anemia in neoplastic disease: Secondary | ICD-10-CM | POA: Diagnosis present

## 2016-12-24 DIAGNOSIS — Z85828 Personal history of other malignant neoplasm of skin: Secondary | ICD-10-CM

## 2016-12-24 DIAGNOSIS — N183 Chronic kidney disease, stage 3 unspecified: Secondary | ICD-10-CM | POA: Diagnosis present

## 2016-12-24 DIAGNOSIS — Z794 Long term (current) use of insulin: Secondary | ICD-10-CM

## 2016-12-24 DIAGNOSIS — R000 Tachycardia, unspecified: Secondary | ICD-10-CM | POA: Diagnosis present

## 2016-12-24 DIAGNOSIS — G8929 Other chronic pain: Secondary | ICD-10-CM | POA: Diagnosis present

## 2016-12-24 DIAGNOSIS — M797 Fibromyalgia: Secondary | ICD-10-CM | POA: Diagnosis present

## 2016-12-24 DIAGNOSIS — E119 Type 2 diabetes mellitus without complications: Secondary | ICD-10-CM

## 2016-12-24 DIAGNOSIS — E1122 Type 2 diabetes mellitus with diabetic chronic kidney disease: Secondary | ICD-10-CM | POA: Diagnosis present

## 2016-12-24 DIAGNOSIS — C250 Malignant neoplasm of head of pancreas: Secondary | ICD-10-CM | POA: Diagnosis present

## 2016-12-24 DIAGNOSIS — R509 Fever, unspecified: Principal | ICD-10-CM | POA: Diagnosis present

## 2016-12-24 DIAGNOSIS — Z79899 Other long term (current) drug therapy: Secondary | ICD-10-CM

## 2016-12-24 DIAGNOSIS — K219 Gastro-esophageal reflux disease without esophagitis: Secondary | ICD-10-CM | POA: Diagnosis present

## 2016-12-24 MED ORDER — SODIUM CHLORIDE 0.9 % IV BOLUS (SEPSIS)
500.0000 mL | Freq: Once | INTRAVENOUS | Status: AC
  Administered 2016-12-25: 500 mL via INTRAVENOUS

## 2016-12-24 MED ORDER — PIPERACILLIN-TAZOBACTAM 3.375 G IVPB 30 MIN
3.3750 g | Freq: Once | INTRAVENOUS | Status: AC
  Administered 2016-12-25: 3.375 g via INTRAVENOUS
  Filled 2016-12-24: qty 50

## 2016-12-24 MED ORDER — VANCOMYCIN HCL IN DEXTROSE 1-5 GM/200ML-% IV SOLN
1000.0000 mg | Freq: Once | INTRAVENOUS | Status: AC
  Administered 2016-12-25: 1000 mg via INTRAVENOUS
  Filled 2016-12-24: qty 200

## 2016-12-24 MED ORDER — SODIUM CHLORIDE 0.9 % IV BOLUS (SEPSIS)
1000.0000 mL | Freq: Once | INTRAVENOUS | Status: AC
  Administered 2016-12-25: 1000 mL via INTRAVENOUS

## 2016-12-24 NOTE — ED Triage Notes (Signed)
Pt on chemotherapy reports fever of 101.3 prior to arrival to hospital. Pt denies cough or urinary symptoms. Pt is being treated for pancreatic cancer.

## 2016-12-24 NOTE — ED Provider Notes (Signed)
[facility] DEPT Provider Note   CSN: [id number] Arrival date & time: 12/24/16  2209  By signing my name below, I, Margit Banda, attest that this documentation has been prepared under the direction and in the presence of Beaver, Jackson Fetters, MD. Electronically Signed: Margit Banda, ED Scribe. 12/24/16. 11:16 PM.  History   Chief Complaint Chief Complaint  Patient presents with  . Fever    chemo pt    HPI Patricia Austin is a 71 y.o. female with a PMHx of pancreatic cancer who presents to the Emergency Department complaining of gradually worsening fever that started ~ 2 days ago. She had a fever of 101.3 PTA. Pt was seen at [facility] in the morning of 12/23/16 and denied admission at that time and left AMA. Pt reports all she did was sleep today. No modifying factors noted at this time. She is currently being treated for pancreatic cancer. Pt denies dysuria, hematuria, cough, sore throat, nausea, vomiting, diarrhea, abdominal pain, and ear pain.  The history is provided by the patient and medical records. No language interpreter was used.  Fever   This is a new problem. The current episode started 2 days ago. The problem has been gradually worsening. The maximum temperature noted was 101 to 101.9 F. The temperature was taken using an oral thermometer. Pertinent negatives include no diarrhea, no vomiting, no sore throat and no cough. She has tried nothing for the symptoms. The treatment provided no relief.    Past Medical History:  Diagnosis Date  . Anxiety    "not all the time"   . Chronic lower back pain   . CKD (chronic kidney disease) stage 3, GFR 30-59 ml/min   . Complication of anesthesia 2002   difficulty /w intubation, with plastic surgery , because she was feeling so anxious. 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 .   Pt. reports that she  is very sensitive to med. & is hard  to wake up.  . Depression    "not all the time"   . Difficult intubation 2002   with head surgery 2002  has done ok with later intubations  . Dysrhythmia    told that she sometimes has extra beats. Does not see cardiologist, had a stress test many yrs. ago>10 yrs. ...update 11-14-16 " i havent had that feelign in a long time "  . Esophageal dysmotility    "getting worse recenly" (12/15/2013)  . Fibromyalgia   . Hypertension   . Nephritis ~ 1955  . Osteoarthritis    knees   . Pancreatic cancer ([place])   . Pancreatic mass   . Renal insufficiency   . Seasonal allergies    "in the spring"  . Shortness of breath    walking long distances   . squamous cell carcinoma scalp    squamous, on scalp, & has had several surgeries on scalp, for plastics repair of scalp   . Type II diabetes mellitus [facility])     Patient Active Problem List   Diagnosis Date Noted  . Port catheter in place 12/22/2016  . DM (diabetes mellitus), type 2, uncontrolled ([place]) 10/28/2016  . Confusion 10/28/2016  . Hyperglycemia 10/27/2016  . Pancreatic cancer ([place])   . Hyponatremia 10/07/2016  . Abdominal pain   . Biliary obstruction 09/29/2016  . Multiple duodenal ulcers   . Lumbar adjacent segment disease with spondylolisthesis 08/20/2016  . Peripheral edema 07/18/2016  . CKD (chronic kidney disease) stage 3, GFR 30-59 ml/min 09/21/2015  . Fibromyalgia 09/20/2015  .  DJD (degenerative joint disease) of knee 12/14/2013  . Diabetes ([place]) 02/09/2013  . Morbid obesity ([place]) 02/09/2013  . Hypertension 02/09/2013  . Hyperlipidemia with target LDL less than 100 02/09/2013  . Osteoarthritis of both feet 02/09/2013  . Osteoarthritis of both knees 02/09/2013  . GERD (gastroesophageal reflux disease) 02/09/2013  . Allergic rhinitis 02/09/2013  . 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 ABDOMINAL HYSTERECTOMY     complete  . APPENDECTOMY    . BACK SURGERY  07/2016   L 4 and L 5 fusion done [facility]   . CARPAL TUNNEL RELEASE Right   . CARPAL TUNNEL RELEASE Left     . COLONOSCOPY    . CYST EXCISION  1990's?   scalp  . ESOPHAGOGASTRODUODENOSCOPY N/A 09/26/2016   Procedure: ESOPHAGOGASTRODUODENOSCOPY (EGD);  Surgeon: Gatha Mayer, MD;  Location[facility] ENDOSCOPY;  Service: Endoscopy;  Laterality: N/A;  . EUS N/A 10/09/2016   Procedure: UPPER ENDOSCOPIC ULTRASOUND (EUS) LINEAR;  Surgeon: Milus Banister, MD;  Location: [facility] ENDOSCOPY;  Service: Endoscopy;  Laterality: N/A;  . IR BILIARY STENT(S) EXISTING ACCESS INC DILATION CATH EXCHANGE  10/09/2016  . IR CHOLANGIOGRAM EXISTING TUBE  10/15/2016  . IR FLUORO GUIDE PORT INSERTION RIGHT  11/19/2016  . IR INT EXT BILIARY DRAIN WITH CHOLANGIOGRAM  09/29/2016   2 gallstones  . IR REMOVAL BILIARY DRAIN  10/20/2016  . IR US GUIDE VASC ACCESS RIGHT  11/19/2016  . LUMBAR DISC SURGERY  ~ 2012 X 2   double fusion ; last was 08-21-15  . RADIOLOGY WITH ANESTHESIA N/A 11/19/2016   Procedure: PORT PLACEMENT WITH GENERAL ANESTHESIA;  Surgeon: Arne Cleveland, MD;  Location: [facility] ORS;  Service: Anesthesiology;  Laterality: N/A;  . SKIN GRAFT  2002   scalp; "went to [facility] to repair OR earlier in the yearr"  . SQUAMOUS CELL CARCINOMA EXCISION  2002   "off scalp; cancerous area removed and stapled; Dr. Georgia Lopes"  . TONSILLECTOMY  age 33  . TOTAL KNEE ARTHROPLASTY Left 12/14/2013   Procedure: LEFT TOTAL KNEE ARTHROPLASTY;  Surgeon: Ninetta Lights, MD;  Location: [facility];  Service: Orthopedics;  Laterality: Left;  . TOTAL KNEE ARTHROPLASTY Left 12/15/2013    OB History    No data available       Home Medications    Prior to Admission medications   Medication Sig Start Date End Date Taking? Authorizing Provider  amLODipine (NORVASC) 5 MG tablet Take 1 tablet (5 mg total) by mouth daily. 11/24/16  Yes Hassell Done, Mary-Margaret, FNP  carvedilol (COREG) 25 MG tablet TAKE  (1)  TABLET TWICE A DAY WITH MEALS (BREAKFAST AND SUPPER) Patient taking differently: Take 25 mg by mouth 2 (two) times daily with a meal.  07/18/16  Yes Hassell Done,  Mary-Margaret, FNP  DULoxetine (CYMBALTA) 60 MG capsule Take 1 capsule (60 mg total) by mouth 2 (two) times daily. 07/18/16  Yes Martin, Mary-Margaret, FNP  Insulin Glargine (TOUJEO SOLOSTAR) 300 UNIT/ML SOPN Inject 40 Units into the skin daily. Patient taking differently: Inject 40 Units into the skin at bedtime.  11/24/16  Yes Martin, Mary-Margaret, FNP  insulin lispro (HUMALOG) 100 UNIT/ML injection Up to 15u with each meal Patient taking differently: Inject 1-15 Units into the skin 3 (three) times daily with meals. Sliding scale 11/24/16  Yes Hassell Done, Mary-Margaret, FNP  lidocaine-prilocaine (EMLA) cream Apply to port site one hour prior to use. Do not rub in. Cover with plastic. 11/10/16  Yes Ladell Pier, MD  ondansetron (ZOFRAN) 8 MG tablet Take 1 tablet (8 mg total) by mouth every 8 (eight) hours as needed for nausea or vomiting. 11/10/16  Yes Ladell Pier, MD  oxyCODONE-acetaminophen (PERCOCET/ROXICET) 5-325 MG tablet Take 1-2 tablets by mouth 2 (two) times daily. 12/23/16  Yes Hassell Done, Mary-Margaret, FNP  pantoprazole (PROTONIX) 40 MG tablet Take 1 tablet (40 mg total) by mouth daily before breakfast. 09/26/16  Yes Gatha Mayer, MD  glucose blood (ONE TOUCH ULTRA TEST) test strip Test up to qid. DX:E11.65 11/13/16   Chevis Pretty, FNP    Family History Family History  Problem Relation Age of Onset  . Early death Mother   . Thrombosis Father     Social History Social History  Substance Use Topics  . Smoking status: Never Smoker  . Smokeless tobacco: Never Used  . Alcohol use No     Allergies   Patient has no known allergies.   Review of Systems Review of Systems  Constitutional: Positive for fever.  HENT: Negative for ear pain and sore throat.   Respiratory: Negative for cough.   Gastrointestinal: Negative for abdominal pain, diarrhea, nausea and vomiting.  Genitourinary: Negative for dysuria and hematuria.  All other systems reviewed and are  negative.    Physical Exam Updated Vital Signs BP (!) 149/70 (BP Location: Left Arm)   Pulse 94   Temp (!) 100.4 F (38 C) (Oral)   Resp 20   SpO2 96%   Physical Exam  Constitutional: She appears well-developed and well-nourished.  HENT:  Head: Normocephalic.  Mouth/Throat: Oropharynx is clear and moist. No oropharyngeal exudate.  Eyes: Conjunctivae and EOM are normal. Pupils are equal, round, and reactive to light. Right eye exhibits no discharge. Left eye exhibits no discharge. No scleral icterus.  Neck: Normal range of motion. Neck supple. No JVD present. No tracheal deviation present.  Trachea is midline. No stridor or carotid bruits.   Cardiovascular: Normal rate, regular rhythm, normal heart sounds and intact distal pulses.   No murmur heard. Pulmonary/Chest: Effort normal and breath sounds normal. No stridor. No respiratory distress. She has no wheezes. She has no rales.  Lungs CTA bilaterally.  Abdominal: Soft. Bowel sounds are normal. She exhibits no distension. There is no tenderness. There is no rebound and no guarding.  Musculoskeletal: Normal range of motion. She exhibits no edema or tenderness.  All compartments are soft. No palpable cords.   Lymphadenopathy:    She has no cervical adenopathy.  Neurological: She is alert. She has normal reflexes. She displays normal reflexes. She exhibits normal muscle tone.  Skin: Skin is warm and dry. Capillary refill takes less than 2 seconds.  Psychiatric: She has a normal mood and affect. Her behavior is normal.  Nursing note and vitals reviewed.    ED Treatments / Results  DIAGNOSTIC STUDIES: Oxygen Saturation is 96% on RA, adequate by my interpretation.   COORDINATION OF CARE: 11:16 PM-Discussed next steps with pt which includes an XR, IV antibiotics and admission to the hospital. Pt verbalized understanding and is agreeable with the plan.   Labs (all labs ordered are listed, but only abnormal results are  displayed)  Results for orders placed or performed during the hospital encounter of 12/24/16  Comprehensive metabolic panel  Result Value Ref Range   Sodium 132 (L) 135 - 145 mmol/L   Potassium 4.9 3.5 - 5.1 mmol/L   Chloride 101 101 - 111 mmol/L   CO2 25 22 -  32 mmol/L   Glucose, Bld 147 (H) 65 - 99 mg/dL   BUN 19 6 - 20 mg/dL   Creatinine, Ser 1.05 (H) 0.44 - 1.00 mg/dL   Calcium 8.6 (L) 8.9 - 10.3 mg/dL   Total Protein 5.6 (L) 6.5 - 8.1 g/dL   Albumin 2.3 (L) 3.5 - 5.0 g/dL   AST 155 (H) 15 - 41 U/L   ALT 71 (H) 14 - 54 U/L   Alkaline Phosphatase 486 (H) 38 - 126 U/L   Total Bilirubin 0.9 0.3 - 1.2 mg/dL   GFR calc non Af Amer 52 (L) >60 mL/min   GFR calc Af Amer 60 (L) >60 mL/min   Anion gap 6 5 - 15  CBC WITH DIFFERENTIAL  Result Value Ref Range   WBC 15.8 (H) 4.0 - 10.5 K/uL   RBC 3.06 (L) 3.87 - 5.11 MIL/uL   Hemoglobin 8.7 (L) 12.0 - 15.0 g/dL   HCT 25.6 (L) 36.0 - 46.0 %   MCV 83.7 78.0 - 100.0 fL   MCH 28.4 26.0 - 34.0 pg   MCHC 34.0 30.0 - 36.0 g/dL   RDW 15.0 11.5 - 15.5 %   Platelets 319 150 - 400 K/uL   Neutrophils Relative % 98 %   Neutro Abs 15.4 (H) 1.7 - 7.7 K/uL   Lymphocytes Relative 2 %   Lymphs Abs 0.3 (L) 0.7 - 4.0 K/uL   Monocytes Relative 0 %   Monocytes Absolute 0.1 0.1 - 1.0 K/uL   Eosinophils Relative 0 %   Eosinophils Absolute 0.0 0.0 - 0.7 K/uL   Basophils Relative 0 %   Basophils Absolute 0.0 0.0 - 0.1 K/uL  I-Stat CG4 Lactic Acid, ED  (not at  [facility])  Result Value Ref Range   Lactic Acid, Venous 1.87 0.5 - 1.9 mmol/L   Dg Chest 2 View  Result Date: 12/25/2016 CLINICAL DATA:  Fever.  Active chemotherapy for pancreatic cancer. EXAM: CHEST  2 VIEW COMPARISON:  Radiograph 09/30/2016, CT 10/27/2016 FINDINGS: Tip of the right chest port in the mid SVC. Stable cardiomegaly and mediastinal contours. No focal airspace disease to suggest pneumonia. Mild bronchial thickening. Calcified granuloma in the left lower lobe. No pleural fluid or  pneumothorax. IMPRESSION: 1. Mild bronchial thickening. No consolidation to suggest pneumonia. 2. Stable cardiomegaly. 3. Right chest port with tip in the mid SVC. Electronically Signed   By: Jeb Levering M.D.   On: 12/25/2016 01:23   Dg Lumbar Spine Complete  Result Date: 12/23/2016 CLINICAL DATA:  Lumbosacral back pain. Felt a pop while sitting on a chair 2 weeks ago with persistent pain. EXAM: LUMBAR SPINE - COMPLETE 4+ VIEW COMPARISON:  Radiograph 09/09/2016 FINDINGS: Posterior L3 through L5 fusion with rod and intrapedicular screws. There are interbody spacers. The hardware appears intact. Disc space narrowing and endplate spurring at Z6-X0 and L2-L3. Vertebral body heights are maintained. No acute fracture. No evidence of focal hepatic lesion. There is a biliary drain in place. IMPRESSION: 1. L3 through L5 fusion without evidence of hardware complication. 2. Degenerative disc disease at L1-L2 and L2-L3. No acute abnormality is seen radiographically. Electronically Signed   By: Jeb Levering M.D.   On: 12/23/2016 02:17    EKG  EKG Interpretation  Date/Time:  Wednesday December 24 2016 23:13:47 EDT Ventricular Rate:  94 PR Interval:    QRS Duration: 70 QT Interval:  348 QTC Calculation: 436 R Axis:   49 Text Interpretation:  Sinus rhythm Borderline T abnormalities, lateral leads  Confirmed by Veatrice Kells [phone]) on 12/24/2016 11:19:46 PM      Radiology Dg Lumbar Spine Complete  Result Date: 12/23/2016 CLINICAL DATA:  Lumbosacral back pain. Felt a pop while sitting on a chair 2 weeks ago with persistent pain. EXAM: LUMBAR SPINE - COMPLETE 4+ VIEW COMPARISON:  Radiograph 09/09/2016 FINDINGS: Posterior L3 through L5 fusion with rod and intrapedicular screws. There are interbody spacers. The hardware appears intact. Disc space narrowing and endplate spurring at U6-J3 and L2-L3. Vertebral body heights are maintained. No acute fracture. No evidence of focal hepatic lesion. There is a biliary  drain in place. IMPRESSION: 1. L3 through L5 fusion without evidence of hardware complication. 2. Degenerative disc disease at L1-L2 and L2-L3. No acute abnormality is seen radiographically. Electronically Signed   By: Jeb Levering M.D.   On: 12/23/2016 02:17    Procedures Procedures (including critical care time)  Medications Ordered in ED  Medications  sodium chloride 0.9 % bolus 1,000 mL (0 mLs Intravenous Stopped 12/25/16 0116)    And  sodium chloride 0.9 % bolus 1,000 mL (0 mLs Intravenous Stopped 12/25/16 0037)    And  sodium chloride 0.9 % bolus 500 mL (500 mLs Intravenous New Bag/Given 12/25/16 0138)  piperacillin-tazobactam (ZOSYN) IVPB 3.375 g (0 g Intravenous Stopped 12/25/16 0037)  vancomycin (VANCOCIN) IVPB 1000 mg/200 mL premix (0 mg Intravenous Stopped 12/25/16 0107)  ketorolac (TORADOL) 30 MG/ML injection 15 mg (15 mg Intravenous Given 12/25/16 0139)     11:17 PM Pt was told up front she needs admission.  MDM Reviewed: previous chart, nursing note and vitals Reviewed previous: labs Interpretation: labs, ECG and x-ray (elevated white count, no acute cardiopulmonary on cxr) Consults: admitting MD  CRITICAL CARE Performed by: Carlisle Beers Total critical care time: 61 minutes Critical care time was exclusive of separately billable procedures and treating other patients. Critical care was necessary to treat or prevent imminent or life-threatening deterioration. Critical care was time spent personally by me on the following activities: development of treatment plan with patient and/or surrogate as well as nursing, discussions with consultants, evaluation of patient's response to treatment, examination of patient, obtaining history from patient or surrogate, ordering and performing treatments and interventions, ordering and review of laboratory studies, ordering and review of radiographic studies, pulse oximetry and re-evaluation of patient's condition.  Final Clinical  Impressions(s) / ED Diagnoses  Sepsis:  Will need admission   I personally performed the services described in this documentation, which was scribed in my presence. The recorded information has been reviewed and is accurate.      Trisa Cranor, MD 12/25/16 0157

## 2016-12-25 ENCOUNTER — Inpatient Hospital Stay (HOSPITAL_COMMUNITY): Payer: Medicare Other

## 2016-12-25 ENCOUNTER — Emergency Department (HOSPITAL_COMMUNITY): Payer: Medicare Other

## 2016-12-25 ENCOUNTER — Encounter (HOSPITAL_COMMUNITY): Payer: Self-pay | Admitting: Emergency Medicine

## 2016-12-25 DIAGNOSIS — N183 Chronic kidney disease, stage 3 (moderate): Secondary | ICD-10-CM | POA: Diagnosis not present

## 2016-12-25 DIAGNOSIS — R000 Tachycardia, unspecified: Secondary | ICD-10-CM | POA: Diagnosis present

## 2016-12-25 DIAGNOSIS — K219 Gastro-esophageal reflux disease without esophagitis: Secondary | ICD-10-CM

## 2016-12-25 DIAGNOSIS — Z79899 Other long term (current) drug therapy: Secondary | ICD-10-CM | POA: Diagnosis not present

## 2016-12-25 DIAGNOSIS — A419 Sepsis, unspecified organism: Secondary | ICD-10-CM | POA: Diagnosis not present

## 2016-12-25 DIAGNOSIS — I10 Essential (primary) hypertension: Secondary | ICD-10-CM

## 2016-12-25 DIAGNOSIS — Z85828 Personal history of other malignant neoplasm of skin: Secondary | ICD-10-CM | POA: Diagnosis not present

## 2016-12-25 DIAGNOSIS — C259 Malignant neoplasm of pancreas, unspecified: Secondary | ICD-10-CM

## 2016-12-25 DIAGNOSIS — E1122 Type 2 diabetes mellitus with diabetic chronic kidney disease: Secondary | ICD-10-CM | POA: Diagnosis present

## 2016-12-25 DIAGNOSIS — E1165 Type 2 diabetes mellitus with hyperglycemia: Secondary | ICD-10-CM | POA: Diagnosis not present

## 2016-12-25 DIAGNOSIS — Z794 Long term (current) use of insulin: Secondary | ICD-10-CM | POA: Diagnosis not present

## 2016-12-25 DIAGNOSIS — Z90710 Acquired absence of both cervix and uterus: Secondary | ICD-10-CM | POA: Diagnosis not present

## 2016-12-25 DIAGNOSIS — G8929 Other chronic pain: Secondary | ICD-10-CM | POA: Diagnosis present

## 2016-12-25 DIAGNOSIS — J9811 Atelectasis: Secondary | ICD-10-CM | POA: Diagnosis not present

## 2016-12-25 DIAGNOSIS — I517 Cardiomegaly: Secondary | ICD-10-CM | POA: Diagnosis not present

## 2016-12-25 DIAGNOSIS — R509 Fever, unspecified: Secondary | ICD-10-CM | POA: Diagnosis present

## 2016-12-25 DIAGNOSIS — D630 Anemia in neoplastic disease: Secondary | ICD-10-CM | POA: Diagnosis present

## 2016-12-25 DIAGNOSIS — R6510 Systemic inflammatory response syndrome (SIRS) of non-infectious origin without acute organ dysfunction: Secondary | ICD-10-CM | POA: Diagnosis present

## 2016-12-25 DIAGNOSIS — M797 Fibromyalgia: Secondary | ICD-10-CM | POA: Diagnosis present

## 2016-12-25 LAB — GLUCOSE, CAPILLARY
GLUCOSE-CAPILLARY: 249 mg/dL — AB (ref 65–99)
Glucose-Capillary: 105 mg/dL — ABNORMAL HIGH (ref 65–99)
Glucose-Capillary: 76 mg/dL (ref 65–99)
Glucose-Capillary: 116 mg/dL — ABNORMAL HIGH (ref 65–99)

## 2016-12-25 LAB — CBC WITH DIFFERENTIAL/PLATELET
Basophils Absolute: 0 10*3/uL (ref 0.0–0.1)
Basophils Relative: 0 %
Eosinophils Absolute: 0 10*3/uL (ref 0.0–0.7)
Eosinophils Relative: 0 %
HEMATOCRIT: 25.6 % — AB (ref 36.0–46.0)
HEMOGLOBIN: 8.7 g/dL — AB (ref 12.0–15.0)
LYMPHS ABS: 0.3 10*3/uL — AB (ref 0.7–4.0)
LYMPHS PCT: 2 %
MCH: 28.4 pg (ref 26.0–34.0)
MCHC: 34 g/dL (ref 30.0–36.0)
MCV: 83.7 fL (ref 78.0–100.0)
Monocytes Absolute: 0.1 10*3/uL (ref 0.1–1.0)
Monocytes Relative: 0 %
NEUTROS ABS: 15.4 10*3/uL — AB (ref 1.7–7.7)
NEUTROS PCT: 98 %
Platelets: 319 10*3/uL (ref 150–400)
RBC: 3.06 MIL/uL — AB (ref 3.87–5.11)
RDW: 15 % (ref 11.5–15.5)
WBC: 15.8 10*3/uL — ABNORMAL HIGH (ref 4.0–10.5)

## 2016-12-25 LAB — COMPREHENSIVE METABOLIC PANEL
ALBUMIN: 2.2 g/dL — AB (ref 3.5–5.0)
ALT: 61 U/L — AB (ref 14–54)
ALT: 71 U/L — ABNORMAL HIGH (ref 14–54)
AST: 119 U/L — AB (ref 15–41)
AST: 155 U/L — ABNORMAL HIGH (ref 15–41)
Albumin: 2.3 g/dL — ABNORMAL LOW (ref 3.5–5.0)
Alkaline Phosphatase: 372 U/L — ABNORMAL HIGH (ref 38–126)
Alkaline Phosphatase: 486 U/L — ABNORMAL HIGH (ref 38–126)
Anion gap: 6 (ref 5–15)
Anion gap: 7 (ref 5–15)
BILIRUBIN TOTAL: 0.7 mg/dL (ref 0.3–1.2)
BUN: 19 mg/dL (ref 6–20)
BUN: 19 mg/dL (ref 6–20)
CHLORIDE: 103 mmol/L (ref 101–111)
CHLORIDE: 101 mmol/L (ref 101–111)
CO2: 23 mmol/L (ref 22–32)
CO2: 25 mmol/L (ref 22–32)
CREATININE: 1.09 mg/dL — AB (ref 0.44–1.00)
Calcium: 8.1 mg/dL — ABNORMAL LOW (ref 8.9–10.3)
Calcium: 8.6 mg/dL — ABNORMAL LOW (ref 8.9–10.3)
Creatinine, Ser: 1.05 mg/dL — ABNORMAL HIGH (ref 0.44–1.00)
GFR calc Af Amer: 57 mL/min — ABNORMAL LOW (ref >60)
GFR, EST AFRICAN AMERICAN: 60 mL/min — AB (ref >60)
GFR, EST NON AFRICAN AMERICAN: 49 mL/min — AB (ref >60)
GFR, EST NON AFRICAN AMERICAN: 52 mL/min — AB (ref >60)
GLUCOSE: 97 mg/dL (ref 65–99)
Glucose, Bld: 147 mg/dL — ABNORMAL HIGH (ref 65–99)
POTASSIUM: 4.9 mmol/L (ref 3.5–5.1)
Potassium: 3.7 mmol/L (ref 3.5–5.1)
SODIUM: 132 mmol/L — AB (ref 135–145)
Sodium: 133 mmol/L — ABNORMAL LOW (ref 135–145)
Total Bilirubin: 0.9 mg/dL (ref 0.3–1.2)
Total Protein: 5.6 g/dL — ABNORMAL LOW (ref 6.5–8.1)
Total Protein: 5.6 g/dL — ABNORMAL LOW (ref 6.5–8.1)

## 2016-12-25 LAB — URINALYSIS, ROUTINE W REFLEX MICROSCOPIC
BILIRUBIN URINE: NEGATIVE
Glucose, UA: 50 mg/dL — AB
Hgb urine dipstick: NEGATIVE
KETONES UR: NEGATIVE mg/dL
Leukocytes, UA: NEGATIVE
Nitrite: NEGATIVE
PH: 5 (ref 5.0–8.0)
Protein, ur: >=300 mg/dL — AB
Specific Gravity, Urine: 1.02 (ref 1.005–1.030)

## 2016-12-25 LAB — CBC
HEMATOCRIT: 21.6 % — AB (ref 36.0–46.0)
Hemoglobin: 7.4 g/dL — ABNORMAL LOW (ref 12.0–15.0)
MCH: 28.6 pg (ref 26.0–34.0)
MCHC: 34.3 g/dL (ref 30.0–36.0)
MCV: 83.4 fL (ref 78.0–100.0)
PLATELETS: 250 10*3/uL (ref 150–400)
RBC: 2.59 MIL/uL — AB (ref 3.87–5.11)
RDW: 14.9 % (ref 11.5–15.5)
WBC: 13 10*3/uL — AB (ref 4.0–10.5)

## 2016-12-25 LAB — I-STAT CG4 LACTIC ACID, ED: LACTIC ACID, VENOUS: 1.87 mmol/L (ref 0.5–1.9)

## 2016-12-25 LAB — PROCALCITONIN: PROCALCITONIN: 0.89 ng/mL

## 2016-12-25 LAB — CG4 I-STAT (LACTIC ACID): Lactic Acid, Venous: 0.73 mmol/L (ref 0.5–1.9)

## 2016-12-25 LAB — LIPASE, BLOOD: LIPASE: 12 U/L (ref 11–51)

## 2016-12-25 MED ORDER — DULOXETINE HCL 30 MG PO CPEP
60.0000 mg | ORAL_CAPSULE | Freq: Two times a day (BID) | ORAL | Status: DC
  Administered 2016-12-25 – 2016-12-26 (×3): 60 mg via ORAL
  Filled 2016-12-25 (×3): qty 2

## 2016-12-25 MED ORDER — PANTOPRAZOLE SODIUM 40 MG PO TBEC
40.0000 mg | DELAYED_RELEASE_TABLET | Freq: Every day | ORAL | Status: DC
  Administered 2016-12-25 – 2016-12-26 (×2): 40 mg via ORAL
  Filled 2016-12-25 (×2): qty 1

## 2016-12-25 MED ORDER — PREMIER PROTEIN SHAKE
11.0000 [oz_av] | Freq: Two times a day (BID) | ORAL | Status: DC
  Administered 2016-12-26: 11 [oz_av] via ORAL
  Filled 2016-12-25 (×2): qty 325.31

## 2016-12-25 MED ORDER — LIDOCAINE-PRILOCAINE 2.5-2.5 % EX CREA
TOPICAL_CREAM | CUTANEOUS | Status: DC | PRN
  Filled 2016-12-25: qty 5

## 2016-12-25 MED ORDER — OXYCODONE-ACETAMINOPHEN 5-325 MG PO TABS
1.0000 | ORAL_TABLET | Freq: Two times a day (BID) | ORAL | Status: DC | PRN
  Administered 2016-12-25: 2 via ORAL
  Administered 2016-12-26: 1 via ORAL
  Filled 2016-12-25 (×2): qty 2

## 2016-12-25 MED ORDER — ONDANSETRON HCL 4 MG/2ML IJ SOLN: 4.0000 mg | Freq: Four times a day (QID) | INTRAMUSCULAR | Status: DC | PRN

## 2016-12-25 MED ORDER — CARVEDILOL 25 MG PO TABS
25.0000 mg | ORAL_TABLET | Freq: Two times a day (BID) | ORAL | Status: DC
  Administered 2016-12-25 – 2016-12-26 (×2): 25 mg via ORAL
  Filled 2016-12-25 (×2): qty 1

## 2016-12-25 MED ORDER — CARVEDILOL 12.5 MG PO TABS: 12.5000 mg | ORAL_TABLET | Freq: Two times a day (BID) | ORAL | Status: DC

## 2016-12-25 MED ORDER — ONDANSETRON HCL 4 MG PO TABS: 4.0000 mg | ORAL_TABLET | Freq: Four times a day (QID) | ORAL | Status: DC | PRN

## 2016-12-25 MED ORDER — SODIUM CHLORIDE 0.9% FLUSH: 10.0000 mL | INTRAVENOUS | Status: DC | PRN

## 2016-12-25 MED ORDER — AMLODIPINE BESYLATE 5 MG PO TABS
5.0000 mg | ORAL_TABLET | Freq: Every day | ORAL | Status: DC
  Administered 2016-12-25 – 2016-12-26 (×2): 5 mg via ORAL
  Filled 2016-12-25 (×2): qty 1

## 2016-12-25 MED ORDER — SODIUM CHLORIDE 0.9 % IV SOLN
INTRAVENOUS | Status: DC
  Administered 2016-12-25 – 2016-12-26 (×3): via INTRAVENOUS

## 2016-12-25 MED ORDER — INSULIN ASPART 100 UNIT/ML ~~LOC~~ SOLN
0.0000 [IU] | Freq: Three times a day (TID) | SUBCUTANEOUS | Status: DC
  Administered 2016-12-26: 3 [IU] via SUBCUTANEOUS

## 2016-12-25 MED ORDER — INSULIN GLARGINE 100 UNIT/ML ~~LOC~~ SOLN
40.0000 [IU] | Freq: Every day | SUBCUTANEOUS | Status: DC
  Administered 2016-12-25: 40 [IU] via SUBCUTANEOUS
  Filled 2016-12-25 (×2): qty 0.4

## 2016-12-25 MED ORDER — ACETAMINOPHEN 650 MG RE SUPP: 650.0000 mg | Freq: Four times a day (QID) | RECTAL | Status: DC | PRN

## 2016-12-25 MED ORDER — KETOROLAC TROMETHAMINE 30 MG/ML IJ SOLN
15.0000 mg | Freq: Once | INTRAMUSCULAR | Status: AC
  Administered 2016-12-25: 15 mg via INTRAVENOUS
  Filled 2016-12-25: qty 1

## 2016-12-25 MED ORDER — PREMIER PROTEIN SHAKE
2.0000 [oz_av] | Freq: Three times a day (TID) | ORAL | Status: DC
  Administered 2016-12-25: 2 [oz_av] via ORAL
  Filled 2016-12-25 (×2): qty 325.31

## 2016-12-25 MED ORDER — ACETAMINOPHEN 325 MG PO TABS
650.0000 mg | ORAL_TABLET | Freq: Four times a day (QID) | ORAL | Status: DC | PRN
  Administered 2016-12-25 – 2016-12-26 (×2): 650 mg via ORAL
  Filled 2016-12-25 (×2): qty 2

## 2016-12-25 MED ORDER — OXYCODONE-ACETAMINOPHEN 5-325 MG PO TABS: 1.0000 | ORAL_TABLET | Freq: Two times a day (BID) | ORAL | Status: DC

## 2016-12-25 MED ORDER — SODIUM CHLORIDE 0.9% FLUSH: 10.0000 mL | Freq: Two times a day (BID) | INTRAVENOUS | Status: DC

## 2016-12-25 MED ORDER — VANCOMYCIN HCL IN DEXTROSE 750-5 MG/150ML-% IV SOLN
750.0000 mg | Freq: Two times a day (BID) | INTRAVENOUS | Status: DC
  Administered 2016-12-25 – 2016-12-26 (×3): 750 mg via INTRAVENOUS
  Filled 2016-12-25 (×3): qty 150

## 2016-12-25 MED ORDER — PIPERACILLIN-TAZOBACTAM 3.375 G IVPB
3.3750 g | Freq: Three times a day (TID) | INTRAVENOUS | Status: DC
  Administered 2016-12-25 – 2016-12-26 (×4): 3.375 g via INTRAVENOUS
  Filled 2016-12-25 (×4): qty 50

## 2016-12-25 MED ORDER — ENSURE ENLIVE PO LIQD: 237.0000 mL | Freq: Two times a day (BID) | ORAL | Status: DC

## 2016-12-25 MED ORDER — CARVEDILOL 25 MG PO TABS
25.0000 mg | ORAL_TABLET | Freq: Two times a day (BID) | ORAL | Status: DC
  Administered 2016-12-25: 25 mg via ORAL
  Filled 2016-12-25: qty 1

## 2016-12-25 NOTE — ED Notes (Signed)
Pt aware of specimen and asked to finish her drink first

## 2016-12-25 NOTE — H&P (Signed)
History and Physical    Patricia Austin HDQ:[id number] DOB: [birth year] DOA: 12/24/2016  Referring MD/NP/PA: April Palumbo, MD PCP: Chevis Pretty, FNP  Patient coming from: Home  Chief Complaint: Fever  HPI: Patricia Austin is a 71 y.o. female with medical history significant of pancreatic cancer on chemotherapy, DM type II, CKD stage III; who presents with complaints of fever. Patient reports that she had her last chemotherapy treatment 3 days ago. She reports the following day not feeling quite her normal self and slept most of the day. She had wanted to check her temperature at that time but couldn't find her thermometer. Review of records shows that the patient was seen at [facility] emergency department on 7/3, but declined admission at that time and left AMA. Yesterday, her husband was able to find the thermometer yesterday and her temperature was elevated up to 101.23F at home. They called the into the nursing care line and were advised to bring the patient in for further evaluation. She complains of having some mild shortness of breath,  intermittent cough that she relates to carbonated drinks, and back pain (chronic). Patient states that she had back surgery back in February and since that time as had pain. Notes that the surgical site is healed without issues. Denies any constipation, diarrhea, dysuria, abdominal pain, dark stools, inability to void, nausea, or vomiting. her recent CA 19-9 on 7/2 was 5714, but appears to be trending downward.   ED Course: Upon admission into the emergency department patient was seen to be febrile up to 100.23F, heart rates up to 105, respirations of 32, blood pressures maintained, O2 saturations maintained on room air. Labs revealed WBC 15.8, hemoglobin 8.7, sodium 132, BUN 19, creatinine 1.05, AP 486, AST 155, ALT 71.  Urinalysis showed rare bacteria and appeared to possibly be contaminated. Chest x-ray showed mild bronchial thickening, but no acute infiltrate.  Sepsis protocol was initiated due to patient's initial presenting symptoms with broad-spectrum antibiotics of vancomycin and Zosyn as her source was identified.     Review of Systems: Review of Systems  Constitutional: Positive for chills and fever.  HENT: Negative for ear discharge and ear pain.   Eyes: Negative for photophobia.  Respiratory: Positive for cough, sputum production and shortness of breath.   Cardiovascular: Negative for palpitations, orthopnea and leg swelling.  Gastrointestinal: Negative for abdominal pain, diarrhea and vomiting.  Genitourinary: Negative for dysuria and frequency.  Musculoskeletal: Positive for back pain. Negative for falls and joint pain.  Skin: Negative for itching and rash.  Neurological: Negative for sensory change and speech change.  Endo/Heme/Allergies: Negative for polydipsia. Does not bruise/bleed easily.  Psychiatric/Behavioral: Negative for hallucinations and substance abuse.  All other systems reviewed and are negative.   Past Medical History:  Diagnosis Date  . Anxiety    "not all the time"   . Chronic lower back pain   . CKD (chronic kidney disease) stage 3, GFR 30-59 ml/min   . Complication of anesthesia 2002   difficulty /w intubation, with plastic surgery , because she was feeling so anxious. 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 .   Pt. reports that she  is very sensitive to med. & is hard  to wake up.  . Depression    "not all the time"   . Difficult intubation 2002   with head surgery 2002 has done ok with later intubations  . Dysrhythmia    told that she sometimes has extra beats. Does not see cardiologist,  had a stress test many yrs. ago>10 yrs. ...update 11-14-16 " i havent had that feelign in a long time "  . Esophageal dysmotility    "getting worse recenly" (12/15/2013)  . Fibromyalgia   . Hypertension   . Nephritis ~ 1955  . Osteoarthritis    knees   . Pancreatic cancer ([place])   . Pancreatic mass   . Renal  insufficiency   . Seasonal allergies    "in the spring"  . Shortness of breath    walking long distances   . squamous cell carcinoma scalp    squamous, on scalp, & has had several surgeries on scalp, for plastics repair of scalp   . Type II diabetes mellitus ([place])     Past Surgical History:  Procedure Laterality Date  . ABDOMINAL HYSTERECTOMY     complete  . APPENDECTOMY    . BACK SURGERY  07/2016   L 4 and L 5 fusion done [facility]   . CARPAL TUNNEL RELEASE Right   . CARPAL TUNNEL RELEASE Left   . COLONOSCOPY    . CYST EXCISION  1990's?   scalp  . ESOPHAGOGASTRODUODENOSCOPY N/A 09/26/2016   Procedure: ESOPHAGOGASTRODUODENOSCOPY (EGD);  Surgeon: Gatha Mayer, MD;  Location: [facility] ENDOSCOPY;  Service: Endoscopy;  Laterality: N/A;  . EUS N/A 10/09/2016   Procedure: UPPER ENDOSCOPIC ULTRASOUND (EUS) LINEAR;  Surgeon: Milus Banister, MD;  Location: [facility] ENDOSCOPY;  Service: Endoscopy;  Laterality: N/A;  . IR BILIARY STENT(S) EXISTING ACCESS INC DILATION CATH EXCHANGE  10/09/2016  . IR CHOLANGIOGRAM EXISTING TUBE  10/15/2016  . IR FLUORO GUIDE PORT INSERTION RIGHT  11/19/2016  . IR INT EXT BILIARY DRAIN WITH CHOLANGIOGRAM  09/29/2016   2 gallstones  . IR REMOVAL BILIARY DRAIN  10/20/2016  . IR US GUIDE VASC ACCESS RIGHT  11/19/2016  . LUMBAR DISC SURGERY  ~ 2012 X 2   double fusion ; last was 08-21-15  . RADIOLOGY WITH ANESTHESIA N/A 11/19/2016   Procedure: PORT PLACEMENT WITH GENERAL ANESTHESIA;  Surgeon: Arne Cleveland, MD;  Location: [facility] ORS;  Service: Anesthesiology;  Laterality: N/A;  . SKIN GRAFT  2002   scalp; "went [facility] to repair OR earlier in the yearr"  . SQUAMOUS CELL CARCINOMA EXCISION  2002   "off scalp; cancerous area removed and stapled; Dr. Georgia Lopes"  . TONSILLECTOMY  age 59  . TOTAL KNEE ARTHROPLASTY Left 12/14/2013   Procedure: LEFT TOTAL KNEE ARTHROPLASTY;  Surgeon: Ninetta Lights, MD;  Location: [facility];  Service: Orthopedics;  Laterality: Left;  . TOTAL KNEE ARTHROPLASTY  Left 12/15/2013     reports that she has never smoked. She has never used smokeless tobacco. She reports that she does not drink alcohol or use drugs.  No Known Allergies  Family History  Problem Relation Age of Onset  . Early death Mother   . Thrombosis Father     Prior to Admission medications   Medication Sig Start Date End Date Taking? Authorizing Provider  amLODipine (NORVASC) 5 MG tablet Take 1 tablet (5 mg total) by mouth daily. 11/24/16  Yes Hassell Done, Mary-Margaret, FNP  carvedilol (COREG) 25 MG tablet TAKE  (1)  TABLET TWICE A DAY WITH MEALS (BREAKFAST AND SUPPER) Patient taking differently: Take 25 mg by mouth 2 (two) times daily with a meal.  07/18/16  Yes Hassell Done, Mary-Margaret, FNP  DULoxetine (CYMBALTA) 60 MG capsule Take 1 capsule (60 mg total) by mouth 2 (two) times daily. 07/18/16  Yes Chevis Pretty, FNP  Insulin Glargine (TOUJEO SOLOSTAR) 300 UNIT/ML SOPN Inject 40 Units into the skin daily. Patient taking differently: Inject 40 Units into the skin at bedtime.  11/24/16  Yes Martin, Mary-Margaret, FNP  insulin lispro (HUMALOG) 100 UNIT/ML injection Up to 15u with each meal Patient taking differently: Inject 1-15 Units into the skin 3 (three) times daily with meals. Sliding scale 11/24/16  Yes Hassell Done, Mary-Margaret, FNP  lidocaine-prilocaine (EMLA) cream Apply to port site one hour prior to use. Do not rub in. Cover with plastic. 11/10/16  Yes Ladell Pier, MD  ondansetron (ZOFRAN) 8 MG tablet Take 1 tablet (8 mg total) by mouth every 8 (eight) hours as needed for nausea or vomiting. 11/10/16  Yes Ladell Pier, MD  oxyCODONE-acetaminophen (PERCOCET/ROXICET) 5-325 MG tablet Take 1-2 tablets by mouth 2 (two) times daily. 12/23/16  Yes Hassell Done, Mary-Margaret, FNP  pantoprazole (PROTONIX) 40 MG tablet Take 1 tablet (40 mg total) by mouth daily before breakfast. 09/26/16  Yes Gatha Mayer, MD  glucose blood (ONE TOUCH ULTRA TEST) test strip Test up to qid. DX:E11.65 11/13/16    Chevis Pretty, FNP    Physical Exam:  Constitutional:Elderly female who appears to be NAD, calm, comfortable Vitals:   12/25/16 0053 12/25/16 0100 12/25/16 0134 12/25/16 0206  BP: (!) 135/55 (!) 132/59 139/61 (!) 132/56  Pulse: (!) 101 (!) 105 (!) 104 (!) 105  Resp: (!) 28 (!) 24 (!) 32 (!) 29  Temp:      TempSrc:      SpO2: 92% 94% 93% 94%   Eyes: PERRL, lids and conjunctivae normal ENMT: Mucous membranes are moist. Posterior pharynx clear of any exudate or lesions. Normal dentition.  Neck: normal, supple, no masses, no thyromegaly Respiratory: clear to auscultation bilaterally, no wheezing, no crackles. Normal respiratory effort. No accessory muscle use.  Cardiovascular: Regular rate and rhythm, no murmurs / rubs / gallops. No extremity edema. 2+ pedal pulses. No carotid bruits.  Abdomen: no tenderness, no masses palpated. No hepatosplenomegaly. Bowel sounds positive.  Musculoskeletal: no clubbing / cyanosis. No joint deformity upper and lower extremities. Good ROM, no contractures. Normal muscle tone.  Skin: no rashes, lesions, ulcers. No induration Neurologic: CN 2-12 grossly intact. Sensation intact, DTR normal. Strength 5/5 in all 4.  Psychiatric: Normal judgment and insight. Alert and oriented x 3. Normal mood.     Labs on Admission: I have personally reviewed following labs and imaging studies  CBC:  Recent Labs Lab 12/22/16 0943 12/24/16 2354  WBC 7.2 15.8*  NEUTROABS 4.6 15.4*  HGB 9.0* 8.7*  HCT 27.1* 25.6*  MCV 84.4 83.7  PLT 311 235   Basic Metabolic Panel:  Recent Labs Lab 12/22/16 0943 12/24/16 2354  NA 137 132*  K 4.0 4.9  CL  --  101  CO2 28 25  GLUCOSE 163* 147*  BUN 17.0 19  CREATININE 1.2* 1.05*  CALCIUM 9.4 8.6*   GFR: Estimated Creatinine Clearance: 49 mL/min (A) (by C-G formula based on SCr of 1.05 mg/dL (H)). Liver Function Tests:  Recent Labs Lab 12/22/16 0943 12/24/16 2354  AST 29 155*  ALT 24 71*  ALKPHOS 532* 486*   BILITOT 0.36 0.9  PROT 5.9* 5.6*  ALBUMIN 2.3* 2.3*   No results for input(s): LIPASE, AMYLASE in the last 168 hours. No results for input(s): AMMONIA in the last 168 hours. Coagulation Profile: No results for input(s): INR, PROTIME in the last 168 hours. Cardiac Enzymes: No results for input(s): CKTOTAL, CKMB, CKMBINDEX, TROPONINI in the  last 168 hours. BNP (last 3 results) No results for input(s): PROBNP in the last 8760 hours. HbA1C: No results for input(s): HGBA1C in the last 72 hours. CBG: No results for input(s): GLUCAP in the last 168 hours. Lipid Profile: No results for input(s): CHOL, HDL, LDLCALC, TRIG, CHOLHDL, LDLDIRECT in the last 72 hours. Thyroid Function Tests: No results for input(s): TSH, T4TOTAL, FREET4, T3FREE, THYROIDAB in the last 72 hours. Anemia Panel: No results for input(s): VITAMINB12, FOLATE, FERRITIN, TIBC, IRON, RETICCTPCT in the last 72 hours. Urine analysis:    Component Value Date/Time   COLORURINE AMBER (A) 12/25/2016 0148   APPEARANCEUR HAZY (A) 12/25/2016 0148   LABSPEC 1.020 12/25/2016 0148   PHURINE 5.0 12/25/2016 0148   GLUCOSEU 50 (A) 12/25/2016 0148   HGBUR NEGATIVE 12/25/2016 0148   BILIRUBINUR NEGATIVE 12/25/2016 0148   BILIRUBINUR negative 04/06/2013 1723   KETONESUR NEGATIVE 12/25/2016 0148   PROTEINUR >=300 (A) 12/25/2016 0148   UROBILINOGEN 1.0 12/06/2013 1244   NITRITE NEGATIVE 12/25/2016 0148   LEUKOCYTESUR NEGATIVE 12/25/2016 0148   Sepsis Labs: No results found for this or any previous visit (from the past 240 hour(s)).   Radiological Exams on Admission: Dg Chest 2 View  Result Date: 12/25/2016 CLINICAL DATA:  Fever.  Active chemotherapy for pancreatic cancer. EXAM: CHEST  2 VIEW COMPARISON:  Radiograph 09/30/2016, CT 10/27/2016 FINDINGS: Tip of the right chest port in the mid SVC. Stable cardiomegaly and mediastinal contours. No focal airspace disease to suggest pneumonia. Mild bronchial thickening. Calcified granuloma  in the left lower lobe. No pleural fluid or pneumothorax. IMPRESSION: 1. Mild bronchial thickening. No consolidation to suggest pneumonia. 2. Stable cardiomegaly. 3. Right chest port with tip in the mid SVC. Electronically Signed   By: Jeb Levering M.D.   On: 12/25/2016 01:23    EKG: Independently reviewed. Sinus rhythm  Assessment/Plan Sepsis, of unknown origin: Acute. Patient presents with fever up to 100.62F, tachycardic, and tachypneic. Initial workup shows no clear source of infection. Patient placed on empiric antibiotics of vancomycin and Zosyn. - Admit to a telemetry bed - Added on pro-calcitonin - Follow-up blood and urine studies  - Recheck chest x-ray this a.m. - Continue empiric antibiotics of vancomycin and Zosyn  Elevated liver enzymes: Question possibly related to pancreatic cancer. - Continue to monitor  Pancreatic cancer on chemotherapy - May want to notify Dr. Benay Spice that the patient is admitted to the hospital  Diabetes mellitus type 2 - Hypoglycemic protocol - Continue home regimen of Lantus - CBGs every before meals with moderate sliding scale insulin   Essential hypertension - Continue amlodipine and Coreg  Chronic Pain  - Continue Cymbalta  and oxycodone  prn  CKD stage III: Stable. - Continue to monitor  GERD - Continue Protonix   DVT prophylaxis: Lovenox Code Status: Full Family Communication: Discussed plan of care with the patient and family present at bedside Disposition Plan: Likely discharge home once medically stable Consults called: none Admission status: Inpatient  Norval Morton MD [facility] Pager [phone]  If 7PM-7AM, please contact night-coverage [email or web address] Password [email or web address]  12/25/2016, 2:36 AM

## 2016-12-25 NOTE — Progress Notes (Signed)
Pharmacy Antibiotic Note  Patricia Austin is a 71 y.o. female admitted on 12/24/2016 with sepsis.  Pharmacy has been consulted for Vancomycin and Zosyn dosing.  Plan: Vancomycin 750mg  (after 1gm in ED) IV every 12 hours.  Goal trough 15-20 mcg/mL. Zosyn 3.375g IV q8h (4 hour infusion).  Daily Serum Creatinine   Height: 5\' 3"  (160 cm) Weight: 184 lb 4.9 oz (83.6 kg) IBW/kg (Calculated) : 52.4  Temp (24hrs), Avg:99.5 F (37.5 C), Min:98.7 F (37.1 C), Max:100.4 F (38 C)   Recent Labs Lab 12/22/16 0943 12/22/16 0943 12/24/16 2354 12/25/16 0009 12/25/16 0346  WBC 7.2  --  15.8*  --   --   CREATININE  --  1.2* 1.05*  --   --   LATICACIDVEN  --   --   --  1.87 0.73    Estimated Creatinine Clearance: 49.6 mL/min (A) (by C-G formula based on SCr of 1.05 mg/dL (H)).    No Known Allergies  Antimicrobials this admission: Vancomycin 12/25/2016 >> Zosyn 12/25/2016 >>   Dose adjustments this admission: -  Microbiology results: pending  Thank you for allowing pharmacy to be a part of this patient's care.  Nani Skillern Crowford 12/25/2016 4:39 AM

## 2016-12-25 NOTE — Progress Notes (Signed)
Initial Nutrition Assessment  DOCUMENTATION CODES:   Obesity unspecified  INTERVENTION:   Premier Protein BID, each supplement provides 160kcal and 30g protein.   NUTRITION DIAGNOSIS:   Increased nutrient needs related to cancer and cancer related treatments as evidenced by increased estimated needs from protein.  GOAL:   Patient will meet greater than or equal to 90% of their needs  MONITOR:   PO intake, Supplement acceptance, Labs, Weight trends  REASON FOR ASSESSMENT:   Malnutrition Screening Tool    ASSESSMENT:   71 y.o. female with medical history significant of pancreatic cancer on chemotherapy, DM type II, CKD stage III; who presents with complaints of fever admitted for sepsis   Met with pt in room today. Pt reports good appetite and oral intake pta and currently. Pt eating 100% of meals in hospital. Per chart, pt is weight stable. RD discussed with pt the importance of adequate protein intake; pt would like to try Premier.   Medications reviewed and include: insulin, protonix, zosyn, vancomycin  Labs reviewed: Na 133(L), alb 2.2(L) Wbc- 13(H), Hgb 7.4(L), Hct 21.6(L)  Nutrition-Focused physical exam completed. Findings are no fat depletion, no muscle depletion, and no edema.   Diet Order:  Diet Carb Modified Fluid consistency: Thin; Room service appropriate? Yes  Skin:  Reviewed, no issues  Last BM:  7/4  Height:   Ht Readings from Last 1 Encounters:  12/25/16 '5\' 3"'$  (1.6 m)    Weight:   Wt Readings from Last 1 Encounters:  12/25/16 184 lb 4.9 oz (83.6 kg)    Ideal Body Weight:  52.2 kg  BMI:  Body mass index is 32.65 kg/m.  Estimated Nutritional Needs:   Kcal:  1700-2000kcal/day   Protein:  84-100g/day   Fluid:  >1.7L/day   EDUCATION NEEDS:   Education needs addressed  Koleen Distance MS, RD, LDN Pager #[phone] After Hours Pager: [phone]

## 2016-12-25 NOTE — Progress Notes (Signed)
  PROGRESS NOTE  Patient admitted earlier this morning. See H&P. Patient with hx pancreatic cancer on chemotherapy, with most recent treatment on Monday. She complains of generalized fatigue, and sleeping most of the day since treatment. Temperature at home was 101.3 and was advised to seek care. Currently, feeling well. No complaints of chest pain, shortness of breath, nausea, vomiting, diarrhea, abdominal pain, dysuria. Has a dry cough.   She is empirically started on vanco and zosyn. No clear source of infection found. Cultures pending.   Dessa Phi, DO [facility] [email or web address] Password [email or web address] 12/25/2016, 9:49 AM

## 2016-12-26 LAB — CBC WITH DIFFERENTIAL/PLATELET
BASOS ABS: 0 10*3/uL (ref 0.0–0.1)
BASOS PCT: 0 %
Eosinophils Absolute: 0.1 10*3/uL (ref 0.0–0.7)
Eosinophils Relative: 1 %
HEMATOCRIT: 22.3 % — AB (ref 36.0–46.0)
Hemoglobin: 7.4 g/dL — ABNORMAL LOW (ref 12.0–15.0)
LYMPHS PCT: 13 %
Lymphs Abs: 0.8 10*3/uL (ref 0.7–4.0)
MCH: 28.1 pg (ref 26.0–34.0)
MCHC: 33.2 g/dL (ref 30.0–36.0)
MCV: 84.8 fL (ref 78.0–100.0)
Monocytes Absolute: 0.1 10*3/uL (ref 0.1–1.0)
Monocytes Relative: 1 %
NEUTROS ABS: 5.7 10*3/uL (ref 1.7–7.7)
NEUTROS PCT: 85 %
Platelets: 293 10*3/uL (ref 150–400)
RBC: 2.63 MIL/uL — AB (ref 3.87–5.11)
RDW: 15.4 % (ref 11.5–15.5)
WBC: 6.7 10*3/uL (ref 4.0–10.5)

## 2016-12-26 LAB — BASIC METABOLIC PANEL
ANION GAP: 6 (ref 5–15)
BUN: 26 mg/dL — ABNORMAL HIGH (ref 6–20)
CALCIUM: 8.3 mg/dL — AB (ref 8.9–10.3)
CHLORIDE: 107 mmol/L (ref 101–111)
CO2: 23 mmol/L (ref 22–32)
Creatinine, Ser: 1.33 mg/dL — ABNORMAL HIGH (ref 0.44–1.00)
GFR calc non Af Amer: 39 mL/min — ABNORMAL LOW (ref >60)
GFR, EST AFRICAN AMERICAN: 45 mL/min — AB (ref >60)
GLUCOSE: 199 mg/dL — AB (ref 65–99)
POTASSIUM: 3.8 mmol/L (ref 3.5–5.1)
Sodium: 136 mmol/L (ref 135–145)

## 2016-12-26 LAB — GLUCOSE, CAPILLARY
GLUCOSE-CAPILLARY: 82 mg/dL (ref 65–99)
Glucose-Capillary: 154 mg/dL — ABNORMAL HIGH (ref 65–99)

## 2016-12-26 LAB — URINE CULTURE

## 2016-12-26 MED ORDER — HEPARIN SOD (PORK) LOCK FLUSH 100 UNIT/ML IV SOLN
500.0000 [IU] | INTRAVENOUS | Status: AC | PRN
  Administered 2016-12-26: 500 [IU]

## 2016-12-26 MED ORDER — LEVOFLOXACIN 750 MG PO TABS: 750.0000 mg | ORAL_TABLET | ORAL | 0 refills | Status: DC

## 2016-12-26 NOTE — Discharge Summary (Addendum)
Physician Discharge Summary  Patricia Austin YPP:[id number] DOB: 1945-08-14 DOA: 12/24/2016  PCP: Chevis Pretty, FNP  Admit date: 12/24/2016 Discharge date: 12/26/2016  Admitted From: Home Disposition:  Home  Recommendations for Outpatient Follow-up:  1. Follow up with PCP in 1 week 2. Follow up with Dr. Benay Spice in 1 week  3. Please obtain BMP/CBC/LFT in 1 week  4. Please follow up on the following pending results: final blood culture results  Home Health: No  Equipment/Devices: None   Discharge Condition: Stable CODE STATUS: Full  Diet recommendation: Heart healthy   Brief/Interim Summary: H&P by Dr. Tamala Julian: Patricia Austin is a 71 y.o. female with medical history significant of pancreatic cancer on chemotherapy, DM type II, CKD stage III; who presents with complaints of fever. Patient reports that she had her last chemotherapy treatment 3 days ago (Monday 7/2). She reports the following day not feeling quite her normal self and slept most of the day. She had wanted to check her temperature at that time but couldn't find her thermometer. Review of records shows that the patient was seen at [facility] emergency department on 7/3, but declined admission at that time and left AMA. Yesterday, her husband was able to find the thermometer yesterday and her temperature was elevated up to 101.46F at home. They called the into the nursing care line and were advised to bring the patient in for further evaluation. She complains of having some mild shortness of breath, intermittent cough that she relates to carbonated drinks, and back pain (chronic). Patient states that she had back surgery back in February and since that time as had pain. Notes that the surgical site is healed without issues. Denies any constipation, diarrhea, dysuria, abdominal pain, dark stools, inability to void, nausea, or vomiting. her recent CA 19-9 on 7/2 was 5714, but appears to be trending downward.  ED Course: Upon admission into  the emergency department patient was seen to be febrile up to 100.57F, heart rates up to 105, respirations of 32, blood pressures maintained, O2 saturations maintained on room air. Labs revealed WBC 15.8, hemoglobin 8.7, sodium 132, BUN 19, creatinine 1.05, AP 486, AST 155, ALT 71.  Urinalysis showed rare bacteria and appeared to possibly be contaminated. Chest x-ray showed mild bronchial thickening, but no acute infiltrate. Sepsis protocol was initiated due to patient's initial presenting symptoms with broad-spectrum antibiotics of vancomycin and Zosyn as her source was identified.    SIRS without clear infectious etiology -Was treated with broad spectrum antibiotics, will discharge home on levaquin. SIRS could have been transient bacteremia vs tumor fever  -Urine culture contaminant with multiple species  -CXR unremarkable -Blood culture negative to date  -Afebrile last 24 hours, no complaints  Acute on chronic normocytic anemia -Anemia of Chronic Disease, cancer, previous multiple procedures as well as phlebotomy. No report of any blood loss this admission, continue to trend Hgb as outpatient, may require prn transfusions if < 7.0   Pancreatic cancer on chemo -Follow up with Dr. Benay Spice  DM type 2 -Home insulin  HTN -Coreg, amlodipine  Chronic pain -Continue home pain regimen  CKD stage III -Stable, Cr 1.2 --> 1.09 --> 1.33  -Continue to monitor with outpatient BMP   GERD -Continue Protonix    Discharge Instructions  Discharge Instructions    Call MD for:  difficulty breathing, headache or visual disturbances    Complete by:  As directed    Call MD for:  extreme fatigue    Complete by:  As directed    Call MD for:  hives    Complete by:  As directed    Call MD for:  persistant dizziness or light-headedness    Complete by:  As directed    Call MD for:  persistant nausea and vomiting    Complete by:  As directed    Call MD for:  severe uncontrolled pain    Complete  by:  As directed    Call MD for:  temperature >100.4    Complete by:  As directed    Diet - low sodium heart healthy    Complete by:  As directed    Discharge instructions    Complete by:  As directed    You were cared for by a hospitalist during your hospital stay. If you have any questions about your discharge medications or the care you received while you were in the hospital after you are discharged, you can call the unit and asked to speak with the hospitalist on call if the hospitalist that took care of you is not available. Once you are discharged, your primary care physician will handle any further medical issues. Please note that NO REFILLS for any discharge medications will be authorized once you are discharged, as it is imperative that you return to your primary care physician (or establish a relationship with a primary care physician if you do not have one) for your aftercare needs so that they can reassess your need for medications and monitor your lab values.   Increase activity slowly    Complete by:  As directed      Allergies as of 12/26/2016   No Known Allergies     Medication List    TAKE these medications   amLODipine 5 MG tablet Commonly known as:  NORVASC Take 1 tablet (5 mg total) by mouth daily.   carvedilol 25 MG tablet Commonly known as:  COREG TAKE  (1)  TABLET TWICE A DAY WITH MEALS (BREAKFAST AND SUPPER) What changed:  how much to take  how to take this  when to take this  additional instructions   DULoxetine 60 MG capsule Commonly known as:  CYMBALTA Take 1 capsule (60 mg total) by mouth 2 (two) times daily.   glucose blood test strip Commonly known as:  ONE TOUCH ULTRA TEST Test up to qid. DX:E11.65   Insulin Glargine 300 UNIT/ML Sopn Commonly known as:  TOUJEO SOLOSTAR Inject 40 Units into the skin daily. What changed:  when to take this   insulin lispro 100 UNIT/ML injection Commonly known as:  HUMALOG Up to 15u with each meal What  changed:  how much to take  how to take this  when to take this  additional instructions   levofloxacin 750 MG tablet Commonly known as:  LEVAQUIN Take 1 tablet (750 mg total) by mouth every other day. Start taking on:  12/27/2016   lidocaine-prilocaine cream Commonly known as:  EMLA Apply to port site one hour prior to use. Do not rub in. Cover with plastic.   ondansetron 8 MG tablet Commonly known as:  ZOFRAN Take 1 tablet (8 mg total) by mouth every 8 (eight) hours as needed for nausea or vomiting.   oxyCODONE-acetaminophen 5-325 MG tablet Commonly known as:  PERCOCET/ROXICET Take 1-2 tablets by mouth 2 (two) times daily.   pantoprazole 40 MG tablet Commonly known as:  PROTONIX Take 1 tablet (40 mg total) by mouth daily before breakfast.  No Known Allergies  Consultations:  None   Procedures/Studies: Dg Chest 2 View  Result Date: 12/25/2016 CLINICAL DATA:  Fever.  Active chemotherapy for pancreatic cancer. EXAM: CHEST  2 VIEW COMPARISON:  Radiograph 09/30/2016, CT 10/27/2016 FINDINGS: Tip of the right chest port in the mid SVC. Stable cardiomegaly and mediastinal contours. No focal airspace disease to suggest pneumonia. Mild bronchial thickening. Calcified granuloma in the left lower lobe. No pleural fluid or pneumothorax. IMPRESSION: 1. Mild bronchial thickening. No consolidation to suggest pneumonia. 2. Stable cardiomegaly. 3. Right chest port with tip in the mid SVC. Electronically Signed   By: Jeb Levering M.D.   On: 12/25/2016 01:23   Dg Lumbar Spine Complete  Result Date: 12/23/2016 CLINICAL DATA:  Lumbosacral back pain. Felt a pop while sitting on a chair 2 weeks ago with persistent pain. EXAM: LUMBAR SPINE - COMPLETE 4+ VIEW COMPARISON:  Radiograph 09/09/2016 FINDINGS: Posterior L3 through L5 fusion with rod and intrapedicular screws. There are interbody spacers. The hardware appears intact. Disc space narrowing and endplate spurring at U2-G2 and L2-L3.  Vertebral body heights are maintained. No acute fracture. No evidence of focal hepatic lesion. There is a biliary drain in place. IMPRESSION: 1. L3 through L5 fusion without evidence of hardware complication. 2. Degenerative disc disease at L1-L2 and L2-L3. No acute abnormality is seen radiographically. Electronically Signed   By: Jeb Levering M.D.   On: 12/23/2016 02:17   Dg Chest Port 1 View  Result Date: 12/25/2016 CLINICAL DATA:  Fever.  History of pancreatic carcinoma EXAM: PORTABLE CHEST 1 VIEW COMPARISON:  Study obtained earlier in the day FINDINGS: Port-A-Cath present with tip in the superior vena cava. No pneumothorax. There is a calcified granuloma in the left base. There is slight right base atelectasis. No edema or consolidation. Heart is upper normal in size with pulmonary vascularity within normal limits. No adenopathy. No bone lesions. IMPRESSION: Port-A-Cath present. No pneumothorax. Calcified granuloma left base. Mild atelectasis right base. No edema or consolidation. Electronically Signed   By: Lowella Grip III M.D.   On: 12/25/2016 08:33     Discharge Exam: Vitals:   12/26/16 0426 12/26/16 1311  BP: (!) 127/45 (!) 141/64  Pulse: 95 86  Resp: 16 18  Temp: 97.9 F (36.6 C) 98 F (36.7 C)   Vitals:   12/25/16 1356 12/25/16 2143 12/26/16 0426 12/26/16 1311  BP: (!) 147/58 (!) 153/58 (!) 127/45 (!) 141/64  Pulse: 92 92 95 86  Resp: 18 16 16 18   Temp: 98.3 F (36.8 C) 98 F (36.7 C) 97.9 F (36.6 C) 98 F (36.7 C)  TempSrc: Oral Oral Oral Oral  SpO2: 96% 97% 90% 98%  Weight:      Height:        General: Pt is alert, awake, not in acute distress Cardiovascular: RRR, S1/S2 +, no rubs, no gallops Respiratory: CTA bilaterally, no wheezing, no rhonchi Abdominal: Soft, NT, ND, bowel sounds + Extremities: no edema, no cyanosis    The results of significant diagnostics from this hospitalization (including imaging, microbiology, ancillary and laboratory) are  listed below for reference.     Microbiology: Recent Results (from the past 240 hour(s))  Blood Culture (routine x 2)     Status: None (Preliminary result)   Collection Time: 12/24/16 11:57 PM  Result Value Ref Range Status   Specimen Description BLOOD LEFT ANTECUBITAL  Final   Special Requests   Final    BOTTLES DRAWN AEROBIC AND ANAEROBIC Blood Culture adequate  volume   Culture   Final    NO GROWTH 1 DAY Performed at [facility], [place] [address], [place], [place] [address]    Report Status PENDING  Incomplete  Blood Culture (routine x 2)     Status: None (Preliminary result)   Collection Time: 12/24/16 11:57 PM  Result Value Ref Range Status   Specimen Description BLOOD RIGHT PORT  Final   Special Requests   Final    BOTTLES DRAWN AEROBIC AND ANAEROBIC Blood Culture adequate volume   Culture   Final    NO GROWTH 1 DAY Performed at [facility], [place] [address], [place],  [address]    Report Status PENDING  Incomplete  Urine Culture     Status: Abnormal   Collection Time: 12/25/16  1:48 AM  Result Value Ref Range Status   Specimen Description URINE, RANDOM  Final   Special Requests NONE  Final   Culture MULTIPLE SPECIES PRESENT, SUGGEST RECOLLECTION (A)  Final   Report Status 12/26/2016 FINAL  Final     Labs: BNP (last 3 results) No results for input(s): BNP in the last 8760 hours. Basic Metabolic Panel:  Recent Labs Lab 12/22/16 0943 12/24/16 2354 12/25/16 0520 12/26/16 0500  NA 137 132* 133* 136  K 4.0 4.9 3.7 3.8  CL  --  101 103 107  CO2 28 25 23 23   GLUCOSE 163* 147* 97 199*  BUN 17.0 19 19 26*  CREATININE 1.2* 1.05* 1.09* 1.33*  CALCIUM 9.4 8.6* 8.1* 8.3*   Liver Function Tests:  Recent Labs Lab 12/22/16 0943 12/24/16 2354 12/25/16 0520  AST 29 155* 119*  ALT 24 71* 61*  ALKPHOS 532* 486* 372*  BILITOT 0.36 0.9 0.7  PROT 5.9* 5.6* 5.6*  ALBUMIN 2.3* 2.3* 2.2*    Recent Labs Lab 12/25/16 0520  LIPASE 12   No results  for input(s): AMMONIA in the last 168 hours. CBC:  Recent Labs Lab 12/22/16 0943 12/24/16 2354 12/25/16 0520 12/26/16 0500  WBC 7.2 15.8* 13.0* 6.7  NEUTROABS 4.6 15.4*  --  5.7  HGB 9.0* 8.7* 7.4* 7.4*  HCT 27.1* 25.6* 21.6* 22.3*  MCV 84.4 83.7 83.4 84.8  PLT 311 319 250 293   Cardiac Enzymes: No results for input(s): CKTOTAL, CKMB, CKMBINDEX, TROPONINI in the last 168 hours. BNP: Invalid input(s): POCBNP CBG:  Recent Labs Lab 12/25/16 1130 12/25/16 1629 12/25/16 2139 12/26/16 0716 12/26/16 1133  GLUCAP 76 116* 249* 154* 82   D-Dimer No results for input(s): DDIMER in the last 72 hours. Hgb A1c No results for input(s): HGBA1C in the last 72 hours. Lipid Profile No results for input(s): CHOL, HDL, LDLCALC, TRIG, CHOLHDL, LDLDIRECT in the last 72 hours. Thyroid function studies No results for input(s): TSH, T4TOTAL, T3FREE, THYROIDAB in the last 72 hours.  Invalid input(s): FREET3 Anemia work up No results for input(s): VITAMINB12, FOLATE, FERRITIN, TIBC, IRON, RETICCTPCT in the last 72 hours. Urinalysis    Component Value Date/Time   COLORURINE AMBER (A) 12/25/2016 0148   APPEARANCEUR HAZY (A) 12/25/2016 0148   LABSPEC 1.020 12/25/2016 0148   PHURINE 5.0 12/25/2016 0148   GLUCOSEU 50 (A) 12/25/2016 0148   HGBUR NEGATIVE 12/25/2016 0148   BILIRUBINUR NEGATIVE 12/25/2016 0148   BILIRUBINUR negative 04/06/2013 1723   KETONESUR NEGATIVE 12/25/2016 0148   PROTEINUR >=300 (A) 12/25/2016 0148   UROBILINOGEN 1.0 12/06/2013 1244   NITRITE NEGATIVE 12/25/2016 0148   LEUKOCYTESUR NEGATIVE 12/25/2016 0148   Sepsis  Labs Invalid input(s): PROCALCITONIN,  WBC,  LACTICIDVEN Microbiology Recent Results (from the past 240 hour(s))  Blood Culture (routine x 2)     Status: None (Preliminary result)   Collection Time: 12/24/16 11:57 PM  Result Value Ref Range Status   Specimen Description BLOOD LEFT ANTECUBITAL  Final   Special Requests   Final    BOTTLES DRAWN  AEROBIC AND ANAEROBIC Blood Culture adequate volume   Culture   Final    NO GROWTH 1 DAY Performed at [facility], [address] [place], [place] [address]    Report Status PENDING  Incomplete  Blood Culture (routine x 2)     Status: None (Preliminary result)   Collection Time: 12/24/16 11:57 PM  Result Value Ref Range Status   Specimen Description BLOOD RIGHT PORT  Final   Special Requests   Final    BOTTLES DRAWN AEROBIC AND ANAEROBIC Blood Culture adequate volume   Culture   Final    NO GROWTH 1 DAY Performed at [facility], [place] [address] [place], [place] [address]    Report Status PENDING  Incomplete  Urine Culture     Status: Abnormal   Collection Time: 12/25/16  1:48 AM  Result Value Ref Range Status   Specimen Description URINE, RANDOM  Final   Special Requests NONE  Final   Culture MULTIPLE SPECIES PRESENT, SUGGEST RECOLLECTION (A)  Final   Report Status 12/26/2016 FINAL  Final     Time coordinating discharge: 40 minutes  SIGNED:  Dessa Phi, DO [facility] Pager [phone]  If 7PM-7AM, please contact night-coverage [email or web address] Password [email or web address] 12/26/2016, 1:15 PM

## 2016-12-29 ENCOUNTER — Ambulatory Visit (INDEPENDENT_AMBULATORY_CARE_PROVIDER_SITE_OTHER): Payer: Medicare Other | Admitting: Pediatrics

## 2016-12-29 ENCOUNTER — Other Ambulatory Visit: Payer: Self-pay | Admitting: Pediatrics

## 2016-12-29 ENCOUNTER — Encounter: Payer: Self-pay | Admitting: Pediatrics

## 2016-12-29 VITALS — BP 126/69 | HR 76 | Temp 98.2°F | Ht 63.0 in | Wt 189.0 lb

## 2016-12-29 DIAGNOSIS — R945 Abnormal results of liver function studies: Principal | ICD-10-CM

## 2016-12-29 DIAGNOSIS — Z09 Encounter for follow-up examination after completed treatment for conditions other than malignant neoplasm: Secondary | ICD-10-CM | POA: Diagnosis not present

## 2016-12-29 DIAGNOSIS — IMO0002 Reserved for concepts with insufficient information to code with codable children: Secondary | ICD-10-CM

## 2016-12-29 DIAGNOSIS — R7989 Other specified abnormal findings of blood chemistry: Secondary | ICD-10-CM | POA: Diagnosis not present

## 2016-12-29 DIAGNOSIS — E1169 Type 2 diabetes mellitus with other specified complication: Secondary | ICD-10-CM | POA: Diagnosis not present

## 2016-12-29 DIAGNOSIS — D649 Anemia, unspecified: Secondary | ICD-10-CM | POA: Diagnosis not present

## 2016-12-29 DIAGNOSIS — Z794 Long term (current) use of insulin: Secondary | ICD-10-CM

## 2016-12-29 DIAGNOSIS — E1165 Type 2 diabetes mellitus with hyperglycemia: Secondary | ICD-10-CM

## 2016-12-29 MED ORDER — INSULIN GLARGINE 300 UNIT/ML ~~LOC~~ SOPN: 20.0000 [IU] | PEN_INJECTOR | Freq: Every day | SUBCUTANEOUS | 5 refills | Status: DC

## 2016-12-29 NOTE — Progress Notes (Signed)
  Subjective:   Patient ID: Patricia Austin, female    DOB: [birth year], 71 y.o.   MRN: [id number] CC: Hospitalization Follow-up (Sepsis)  HPI: Patricia Austin is a 71 y.o. female presenting for Hospitalization Follow-up (Sepsis)  The patient was discharged from [facility] on 7/6 with a primary diagnosis of SIRS without clear infections etiology.   She has pancreatic cancer, on chemotherapy.  Still feeling tired Slept a lot over the weekend Blood sugar dropped to 45 two mornings ago, thinks she didn't eat enough that day  DM2: was taking 40u, cut back to 20u of toujeo past few days since low sugar as above AM BGL 220 this morning, says she woke up in middle of night to get some watermelon Does SSI three times a day  Says she is always hungry, but fills up fast No fevers since being home No coughing Blood cultures from hospital have not grown anything  Relevant past medical, surgical, family and social history reviewed. Allergies and medications reviewed and updated. History  Smoking Status  . Never Smoker  Smokeless Tobacco  . Never Used   ROS: Per HPI   Objective:    BP 126/69   Pulse 76   Temp 98.2 F (36.8 C) (Oral)   Ht '5\' 3"'$  (1.6 m)   Wt 189 lb (85.7 kg)   SpO2 97%   BMI 33.48 kg/m   Wt Readings from Last 3 Encounters:  12/29/16 189 lb (85.7 kg)  12/25/16 184 lb 4.9 oz (83.6 kg)  12/23/16 180 lb (81.6 kg)    Gen: NAD, alert, cooperative with exam, NCAT EYES: no conjunctival injection, or no icterus ENT:  TMs pearly gray b/l, OP without erythema LYMPH: no cervical LAD CV: NRRR, normal S1/S2, no murmur, distal pulses 2+ b/l Resp: CTABL, no wheezes, normal WOB Abd: +BS, soft, NTND. no guarding or organomegaly Ext: No edema, warm Neuro: Alert and oriented  Assessment & Plan:  Patricia Austin was seen today for hospitalization follow-up.  Diagnoses and all orders for this visit:  Elevated LFTs Repeat LFTs -     CMP14+EGFR  Uncontrolled type 2 diabetes mellitus with  other specified complication, with long-term current use of insulin (HCC) Appetite has been down, low BGL as above Decrease glargine to 20u at night, check BGls and use SSI three times a day, write numbers down, bring back to clinic in 2 weeks, may need change in insulin -     Insulin Glargine (TOUJEO SOLOSTAR) 300 UNIT/ML SOPN; Inject 20 Units into the skin daily.  Hospital discharge follow-up No further fevers Repeat labs Cultures neg to date  Anemia, unspecified type Repeat CBC, Hg down, on chemotherapy -     CBC with Differential/Platelet   Follow up plan: Return in about 3 weeks (around 01/19/2017). Assunta Found, MD [place]

## 2016-12-29 NOTE — Patient Instructions (Signed)
Check blood pressures at home Let me know if stay >150 on top or >90 on bottom  Check blood sugar levels every morning and before each meal Write down numbers and bring to next clinic visit

## 2016-12-30 ENCOUNTER — Other Ambulatory Visit: Payer: Self-pay

## 2016-12-30 ENCOUNTER — Inpatient Hospital Stay (HOSPITAL_COMMUNITY)
Admission: EM | Admit: 2016-12-30 | Discharge: 2017-01-01 | DRG: 291 | Disposition: A | Discharge status: Home / Self Care | Payer: Medicare Other | Attending: Internal Medicine | Admitting: Internal Medicine

## 2016-12-30 ENCOUNTER — Emergency Department (HOSPITAL_COMMUNITY): Payer: Medicare Other

## 2016-12-30 ENCOUNTER — Encounter (HOSPITAL_COMMUNITY): Payer: Self-pay

## 2016-12-30 DIAGNOSIS — I10 Essential (primary) hypertension: Secondary | ICD-10-CM | POA: Diagnosis present

## 2016-12-30 DIAGNOSIS — D638 Anemia in other chronic diseases classified elsewhere: Secondary | ICD-10-CM | POA: Diagnosis present

## 2016-12-30 DIAGNOSIS — IMO0002 Reserved for concepts with insufficient information to code with codable children: Secondary | ICD-10-CM | POA: Diagnosis present

## 2016-12-30 DIAGNOSIS — I5033 Acute on chronic diastolic (congestive) heart failure: Secondary | ICD-10-CM | POA: Diagnosis not present

## 2016-12-30 DIAGNOSIS — C259 Malignant neoplasm of pancreas, unspecified: Secondary | ICD-10-CM | POA: Diagnosis not present

## 2016-12-30 DIAGNOSIS — Z96652 Presence of left artificial knee joint: Secondary | ICD-10-CM | POA: Diagnosis not present

## 2016-12-30 DIAGNOSIS — G8929 Other chronic pain: Secondary | ICD-10-CM | POA: Diagnosis not present

## 2016-12-30 DIAGNOSIS — K219 Gastro-esophageal reflux disease without esophagitis: Secondary | ICD-10-CM | POA: Diagnosis present

## 2016-12-30 DIAGNOSIS — I130 Hypertensive heart and chronic kidney disease with heart failure and stage 1 through stage 4 chronic kidney disease, or unspecified chronic kidney disease: Secondary | ICD-10-CM | POA: Diagnosis not present

## 2016-12-30 DIAGNOSIS — Z794 Long term (current) use of insulin: Secondary | ICD-10-CM | POA: Diagnosis not present

## 2016-12-30 DIAGNOSIS — Z85828 Personal history of other malignant neoplasm of skin: Secondary | ICD-10-CM

## 2016-12-30 DIAGNOSIS — M797 Fibromyalgia: Secondary | ICD-10-CM | POA: Diagnosis present

## 2016-12-30 DIAGNOSIS — R0602 Shortness of breath: Secondary | ICD-10-CM | POA: Diagnosis not present

## 2016-12-30 DIAGNOSIS — J810 Acute pulmonary edema: Secondary | ICD-10-CM | POA: Diagnosis not present

## 2016-12-30 DIAGNOSIS — C258 Malignant neoplasm of overlapping sites of pancreas: Secondary | ICD-10-CM | POA: Diagnosis not present

## 2016-12-30 DIAGNOSIS — E1122 Type 2 diabetes mellitus with diabetic chronic kidney disease: Secondary | ICD-10-CM | POA: Diagnosis not present

## 2016-12-30 DIAGNOSIS — N183 Chronic kidney disease, stage 3 unspecified: Secondary | ICD-10-CM | POA: Diagnosis present

## 2016-12-30 DIAGNOSIS — E1165 Type 2 diabetes mellitus with hyperglycemia: Secondary | ICD-10-CM | POA: Diagnosis not present

## 2016-12-30 DIAGNOSIS — J9601 Acute respiratory failure with hypoxia: Secondary | ICD-10-CM | POA: Diagnosis present

## 2016-12-30 DIAGNOSIS — Z9221 Personal history of antineoplastic chemotherapy: Secondary | ICD-10-CM

## 2016-12-30 DIAGNOSIS — Z79899 Other long term (current) drug therapy: Secondary | ICD-10-CM | POA: Diagnosis not present

## 2016-12-30 DIAGNOSIS — I248 Other forms of acute ischemic heart disease: Secondary | ICD-10-CM | POA: Diagnosis not present

## 2016-12-30 DIAGNOSIS — Z981 Arthrodesis status: Secondary | ICD-10-CM

## 2016-12-30 DIAGNOSIS — N179 Acute kidney failure, unspecified: Secondary | ICD-10-CM | POA: Diagnosis present

## 2016-12-30 DIAGNOSIS — J9621 Acute and chronic respiratory failure with hypoxia: Secondary | ICD-10-CM | POA: Diagnosis not present

## 2016-12-30 DIAGNOSIS — E1169 Type 2 diabetes mellitus with other specified complication: Secondary | ICD-10-CM

## 2016-12-30 DIAGNOSIS — C250 Malignant neoplasm of head of pancreas: Secondary | ICD-10-CM | POA: Diagnosis present

## 2016-12-30 DIAGNOSIS — K210 Gastro-esophageal reflux disease with esophagitis: Secondary | ICD-10-CM | POA: Diagnosis present

## 2016-12-30 DIAGNOSIS — E1142 Type 2 diabetes mellitus with diabetic polyneuropathy: Secondary | ICD-10-CM

## 2016-12-30 DIAGNOSIS — I361 Nonrheumatic tricuspid (valve) insufficiency: Secondary | ICD-10-CM | POA: Diagnosis not present

## 2016-12-30 DIAGNOSIS — D500 Iron deficiency anemia secondary to blood loss (chronic): Secondary | ICD-10-CM | POA: Diagnosis not present

## 2016-12-30 LAB — COMPREHENSIVE METABOLIC PANEL
ALBUMIN: 2.9 g/dL — AB (ref 3.5–5.0)
ALK PHOS: 603 U/L — AB (ref 38–126)
ALT: 36 U/L (ref 14–54)
AST: 49 U/L — AB (ref 15–41)
Anion gap: 9 (ref 5–15)
BILIRUBIN TOTAL: 0.4 mg/dL (ref 0.3–1.2)
BUN: 23 mg/dL — AB (ref 6–20)
CALCIUM: 9.5 mg/dL (ref 8.9–10.3)
CO2: 27 mmol/L (ref 22–32)
Chloride: 102 mmol/L (ref 101–111)
Creatinine, Ser: 1.04 mg/dL — ABNORMAL HIGH (ref 0.44–1.00)
GFR calc Af Amer: >60 mL/min (ref >60)
GFR, EST NON AFRICAN AMERICAN: 52 mL/min — AB (ref >60)
GLUCOSE: 157 mg/dL — AB (ref 65–99)
Potassium: 3.6 mmol/L (ref 3.5–5.1)
Sodium: 138 mmol/L (ref 135–145)
TOTAL PROTEIN: 6.8 g/dL (ref 6.5–8.1)

## 2016-12-30 LAB — CMP14+EGFR
ALBUMIN: 2.9 g/dL — AB (ref 3.5–4.8)
ALK PHOS: 700 IU/L — AB (ref 39–117)
ALT: 36 IU/L — ABNORMAL HIGH (ref 0–32)
AST: 53 IU/L — ABNORMAL HIGH (ref 0–40)
Albumin/Globulin Ratio: 1.1 — ABNORMAL LOW (ref 1.2–2.2)
BUN / CREAT RATIO: 18 (ref 12–28)
BUN: 17 mg/dL (ref 8–27)
Bilirubin Total: 0.2 mg/dL (ref 0.0–1.2)
CO2: 25 mmol/L (ref 20–29)
CREATININE: 0.97 mg/dL (ref 0.57–1.00)
Calcium: 9.2 mg/dL (ref 8.7–10.3)
Chloride: 103 mmol/L (ref 96–106)
GFR calc non Af Amer: 59 mL/min/{1.73_m2} — ABNORMAL LOW (ref >59)
GFR, EST AFRICAN AMERICAN: 67 mL/min/{1.73_m2} (ref >59)
GLUCOSE: 86 mg/dL (ref 65–99)
Globulin, Total: 2.7 g/dL (ref 1.5–4.5)
Potassium: 4.2 mmol/L (ref 3.5–5.2)
Sodium: 140 mmol/L (ref 134–144)
TOTAL PROTEIN: 5.6 g/dL — AB (ref 6.0–8.5)

## 2016-12-30 LAB — CBC WITH DIFFERENTIAL/PLATELET
BASOS ABS: 0 10*3/uL (ref 0.0–0.1)
BASOS PCT: 0 %
Basophils Absolute: 0 10*3/uL (ref 0.0–0.2)
Basos: 1 %
EOS (ABSOLUTE): 0.1 10*3/uL (ref 0.0–0.4)
EOS: 1 %
Eosinophils Absolute: 0.1 10*3/uL (ref 0.0–0.7)
Eosinophils Relative: 1 %
HEMATOCRIT: 24.9 % — AB (ref 34.0–46.6)
HEMATOCRIT: 25.6 % — AB (ref 36.0–46.0)
HEMOGLOBIN: 7.9 g/dL — AB (ref 11.1–15.9)
HEMOGLOBIN: 8.5 g/dL — AB (ref 12.0–15.0)
IMMATURE GRANS (ABS): 0.1 10*3/uL (ref 0.0–0.1)
Immature Granulocytes: 2 %
LYMPHS ABS: 1.4 10*3/uL (ref 0.7–3.1)
LYMPHS PCT: 16 %
Lymphs Abs: 1.4 10*3/uL (ref 0.7–4.0)
Lymphs: 21 %
MCH: 27.1 pg (ref 26.6–33.0)
MCH: 27.8 pg (ref 26.0–34.0)
MCHC: 31.7 g/dL (ref 31.5–35.7)
MCHC: 33.2 g/dL (ref 30.0–36.0)
MCV: 85 fL (ref 79–97)
MCV: 83.7 fL (ref 78.0–100.0)
MONO ABS: 0.8 10*3/uL (ref 0.1–1.0)
MONOCYTES: 8 %
MONOS PCT: 9 %
Monocytes Absolute: 0.5 10*3/uL (ref 0.1–0.9)
NEUTROS ABS: 6.3 10*3/uL (ref 1.7–7.7)
NEUTROS PCT: 74 %
Neutrophils Absolute: 4.6 10*3/uL (ref 1.4–7.0)
Neutrophils: 67 %
Platelets: 301 10*3/uL (ref 150–379)
Platelets: 260 10*3/uL (ref 150–400)
RBC: 2.92 x10E6/uL — AB (ref 3.77–5.28)
RBC: 3.06 MIL/uL — ABNORMAL LOW (ref 3.87–5.11)
RDW: 15.7 % — ABNORMAL HIGH (ref 12.3–15.4)
RDW: 15.5 % (ref 11.5–15.5)
WBC: 6.5 10*3/uL (ref 3.4–10.8)
WBC: 8.5 10*3/uL (ref 4.0–10.5)

## 2016-12-30 LAB — URINALYSIS, ROUTINE W REFLEX MICROSCOPIC
BACTERIA UA: NONE SEEN
BILIRUBIN URINE: NEGATIVE
GLUCOSE, UA: NEGATIVE mg/dL
HGB URINE DIPSTICK: NEGATIVE
KETONES UR: NEGATIVE mg/dL
LEUKOCYTES UA: NEGATIVE
NITRITE: NEGATIVE
PROTEIN: 30 mg/dL — AB
Specific Gravity, Urine: 1.005 (ref 1.005–1.030)
pH: 6 (ref 5.0–8.0)

## 2016-12-30 LAB — I-STAT TROPONIN, ED: Troponin i, poc: 0.01 ng/mL (ref 0.00–0.08)

## 2016-12-30 LAB — CULTURE, BLOOD (ROUTINE X 2)
CULTURE: NO GROWTH
Culture: NO GROWTH
SPECIAL REQUESTS: ADEQUATE
Special Requests: ADEQUATE

## 2016-12-30 LAB — LIPASE, BLOOD: Lipase: 26 U/L (ref 11–51)

## 2016-12-30 LAB — BRAIN NATRIURETIC PEPTIDE: B NATRIURETIC PEPTIDE 5: 378.8 pg/mL — AB (ref 0.0–100.0)

## 2016-12-30 LAB — D-DIMER, QUANTITATIVE: D-Dimer, Quant: 2.76 ug/mL-FEU — ABNORMAL HIGH (ref 0.00–0.50)

## 2016-12-30 MED ORDER — IOPAMIDOL (ISOVUE-370) INJECTION 76%
100.0000 mL | Freq: Once | INTRAVENOUS | Status: AC | PRN
  Administered 2016-12-31: 100 mL via INTRAVENOUS

## 2016-12-30 MED ORDER — FUROSEMIDE 10 MG/ML IJ SOLN
40.0000 mg | Freq: Once | INTRAMUSCULAR | Status: AC
  Administered 2016-12-30: 40 mg via INTRAVENOUS
  Filled 2016-12-30: qty 4

## 2016-12-30 MED ORDER — IOPAMIDOL (ISOVUE-370) INJECTION 76%
INTRAVENOUS | Status: AC
  Filled 2016-12-30: qty 100

## 2016-12-30 NOTE — ED Provider Notes (Signed)
[facility] DEPT Provider Note   CSN: [id number] Arrival date & time: 12/30/16  1957     History   Chief Complaint Chief Complaint  Patient presents with  . Shortness of Breath    HPI Patricia Austin is a 71 y.o. female.  HPI Patient is on chemotherapy for pancreatic cancer. Was recently admitted for fever but unknown source. Discharged 4 days ago. States she's had increasing shortness of breath especially with exertion and lying flat. She's had distention of her abdomen and bilateral lower extremity swelling. Denies any further fevers. Denies any chest pain. Has ongoing chronic low back pain which is unchanged. States she took 40 mg of Lasix earlier today and has urinated multiple times since. Past Medical History:  Diagnosis Date  . Anxiety    "not all the time"   . Chronic lower back pain   . CKD (chronic kidney disease) stage 3, GFR 30-59 ml/min   . Complication of anesthesia 2002   difficulty /w intubation, with plastic surgery , because she was feeling so anxious. 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 .   Pt. reports that she  is very sensitive to med. & is hard  to wake up.  . Depression    "not all the time"   . Difficult intubation 2002   with head surgery 2002 has done ok with later intubations  . Dysrhythmia    told that she sometimes has extra beats. Does not see cardiologist, had a stress test many yrs. ago>10 yrs. ...update 11-14-16 " i havent had that feelign in a long time "  . Esophageal dysmotility    "getting worse recenly" (12/15/2013)  . Fibromyalgia   . Hypertension   . Nephritis ~ 1955  . Osteoarthritis    knees   . Pancreatic cancer ([place])   . Pancreatic mass   . Renal insufficiency   . Seasonal allergies    "in the spring"  . Shortness of breath    walking long distances   . squamous cell carcinoma scalp    squamous, on scalp, & has had several surgeries on scalp, for plastics repair of scalp   . Type II diabetes mellitus [facility])      Patient Active Problem List   Diagnosis Date Noted  . Acute respiratory failure with hypoxia ([place]) 12/31/2016  . Acute on chronic respiratory failure with hypoxia ([place]) 12/30/2016  . Sepsis ([place]) 12/25/2016  . Port catheter in place 12/22/2016  . DM (diabetes mellitus), type 2, uncontrolled ([place]) 10/28/2016  . Confusion 10/28/2016  . Hyperglycemia 10/27/2016  . Pancreatic cancer ([place])   . Hyponatremia 10/07/2016  . Abdominal pain   . Biliary obstruction 09/29/2016  . Multiple duodenal ulcers   . Lumbar adjacent segment disease with spondylolisthesis 08/20/2016  . Peripheral edema 07/18/2016  . CKD (chronic kidney disease) stage 3, GFR 30-59 ml/min 09/21/2015  . Fibromyalgia 09/20/2015  . DJD (degenerative joint disease) of knee 12/14/2013  . Diabetes ([place]) 02/09/2013  . Morbid obesity ([place]) 02/09/2013  . Hypertension 02/09/2013  . Hyperlipidemia with target LDL less than 100 02/09/2013  . Osteoarthritis of both feet 02/09/2013  . Osteoarthritis of both knees 02/09/2013  . GERD (gastroesophageal reflux disease) 02/09/2013  . Allergic rhinitis 02/09/2013  . 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 ABDOMINAL HYSTERECTOMY     complete  . APPENDECTOMY    . BACK SURGERY  07/2016   L 4 and L  5 fusion done at [facility]   . CARPAL TUNNEL RELEASE Right   . CARPAL TUNNEL RELEASE Left   . COLONOSCOPY    . CYST EXCISION  1990's?   scalp  . ESOPHAGOGASTRODUODENOSCOPY N/A 09/26/2016   Procedure: ESOPHAGOGASTRODUODENOSCOPY (EGD);  Surgeon: Gatha Mayer, MD;  Location[facility] ENDOSCOPY;  Service: Endoscopy;  Laterality: N/A;  . EUS N/A 10/09/2016   Procedure: UPPER ENDOSCOPIC ULTRASOUND (EUS) LINEAR;  Surgeon: Milus Banister, MD;  Location: [facility] ENDOSCOPY;  Service: Endoscopy;  Laterality: N/A;  . IR BILIARY STENT(S) EXISTING ACCESS INC DILATION CATH EXCHANGE  10/09/2016  . IR CHOLANGIOGRAM EXISTING TUBE  10/15/2016  . IR FLUORO GUIDE  PORT INSERTION RIGHT  11/19/2016  . IR INT EXT BILIARY DRAIN WITH CHOLANGIOGRAM  09/29/2016   2 gallstones  . IR REMOVAL BILIARY DRAIN  10/20/2016  . IR US GUIDE VASC ACCESS RIGHT  11/19/2016  . LUMBAR DISC SURGERY  ~ 2012 X 2   double fusion ; last was 08-21-15  . RADIOLOGY WITH ANESTHESIA N/A 11/19/2016   Procedure: PORT PLACEMENT WITH GENERAL ANESTHESIA;  Surgeon: Arne Cleveland, MD;  Location: [facility] ORS;  Service: Anesthesiology;  Laterality: N/A;  . SKIN GRAFT  2002   scalp; "[facility] to repair OR earlier in the yearr"  . SQUAMOUS CELL CARCINOMA EXCISION  2002   "off scalp; cancerous area removed and stapled; Dr. Georgia Lopes"  . TONSILLECTOMY  age 1  . TOTAL KNEE ARTHROPLASTY Left 12/14/2013   Procedure: LEFT TOTAL KNEE ARTHROPLASTY;  Surgeon: Ninetta Lights, MD;  Location: [facility];  Service: Orthopedics;  Laterality: Left;  . TOTAL KNEE ARTHROPLASTY Left 12/15/2013    OB History    No data available       Home Medications    Prior to Admission medications   Medication Sig Start Date End Date Taking? Authorizing Provider  acetaminophen (TYLENOL) 500 MG tablet Take 500-1,000 mg by mouth every 6 (six) hours as needed for moderate pain.   Yes [provider]  amLODipine (NORVASC) 5 MG tablet Take 1 tablet (5 mg total) by mouth daily. 11/24/16  Yes Hassell Done, Mary-Margaret, FNP  carvedilol (COREG) 25 MG tablet TAKE  (1)  TABLET TWICE A DAY WITH MEALS (BREAKFAST AND SUPPER) Patient taking differently: Take 25 mg by mouth 2 (two) times daily with a meal.  07/18/16  Yes Hassell Done, Mary-Margaret, FNP  DULoxetine (CYMBALTA) 60 MG capsule Take 1 capsule (60 mg total) by mouth 2 (two) times daily. 07/18/16  Yes Martin, Mary-Margaret, FNP  Insulin Glargine (TOUJEO SOLOSTAR) 300 UNIT/ML SOPN Inject 20 Units into the skin daily. 12/29/16  Yes Eustaquio Maize, MD  insulin lispro (HUMALOG) 100 UNIT/ML injection Up to 15u with each meal Patient taking differently: Inject 1-15 Units into the skin 3  (three) times daily with meals. Sliding scale 11/24/16  Yes Hassell Done, Mary-Margaret, FNP  levofloxacin (LEVAQUIN) 750 MG tablet Take 1 tablet (750 mg total) by mouth every other day. 12/27/16 01/04/17 Yes Dessa Phi Chahn-Yang, DO  oxyCODONE-acetaminophen (PERCOCET/ROXICET) 5-325 MG tablet Take 1-2 tablets by mouth 2 (two) times daily. Patient taking differently: Take 1 tablet by mouth every 6 (six) hours as needed for severe pain.  12/23/16  Yes Hassell Done, Mary-Margaret, FNP  pantoprazole (PROTONIX) 40 MG tablet Take 1 tablet (40 mg total) by mouth daily before breakfast. 09/26/16  Yes Gatha Mayer, MD  glucose blood (ONE TOUCH ULTRA TEST) test strip Test up to qid. DX:E11.65 11/13/16   Chevis Pretty, FNP  lidocaine-prilocaine (  EMLA) cream Apply to port site one hour prior to use. Do not rub in. Cover with plastic. 11/10/16   Ladell Pier, MD  ondansetron (ZOFRAN) 8 MG tablet Take 1 tablet (8 mg total) by mouth every 8 (eight) hours as needed for nausea or vomiting. 11/10/16   Ladell Pier, MD    Family History Family History  Problem Relation Age of Onset  . Early death Mother   . Thrombosis Father     Social History Social History  Substance Use Topics  . Smoking status: Never Smoker  . Smokeless tobacco: Never Used  . Alcohol use No     Allergies   Patient has no known allergies.   Review of Systems Review of Systems  Constitutional: Positive for fatigue. Negative for chills and fever.  HENT: Negative for congestion, sinus pressure, sore throat and trouble swallowing.   Eyes: Negative for visual disturbance.  Respiratory: Positive for shortness of breath. Negative for cough, chest tightness and wheezing.   Cardiovascular: Positive for leg swelling. Negative for chest pain and palpitations.  Gastrointestinal: Negative for abdominal pain, constipation, diarrhea, nausea and vomiting.  Genitourinary: Positive for frequency. Negative for difficulty urinating, dysuria, flank  pain and hematuria.  Musculoskeletal: Positive for back pain. Negative for arthralgias, joint swelling, neck pain and neck stiffness.  Skin: Negative for color change, pallor, rash and wound.  Neurological: Positive for weakness (generalized). Negative for dizziness, light-headedness, numbness and headaches.  All other systems reviewed and are negative.    Physical Exam Updated Vital Signs BP (!) 162/73 (BP Location: Right Arm)   Pulse (!) 126   Temp 98.8 F (37.1 C) (Oral)   Resp 20   Ht 5\' 3"  (1.6 m)   Wt 85 kg (187 lb 6.3 oz)   SpO2 93%   BMI 33.19 kg/m   Physical Exam  Constitutional: She is oriented to person, place, and time. She appears well-developed and well-nourished. No distress.  HENT:  Head: Normocephalic and atraumatic.  Mouth/Throat: Oropharynx is clear and moist. No oropharyngeal exudate.  Eyes: EOM are normal. Pupils are equal, round, and reactive to light.  Neck: Normal range of motion. Neck supple.  Cardiovascular: Normal rate and regular rhythm.  Exam reveals no gallop and no friction rub.   No murmur heard. Pulmonary/Chest: Effort normal. She has rales.  Crackles in bilateral bases. No respiratory distress.  Abdominal: Soft. Bowel sounds are normal. She exhibits distension. She exhibits no mass. There is no tenderness. There is no rebound and no guarding. No hernia.  Mild diffuse distention without tenderness to palpation.  Musculoskeletal: Normal range of motion. She exhibits edema. She exhibits no tenderness.  1+ bilateral lower extremity edema without tenderness. Distal pulses are 2+. No CVA tenderness. No midline thoracic or lumbar tenderness. Patient does have some mild sacral tenderness to palpation which she states is chronic.  Lymphadenopathy:    She has no cervical adenopathy.  Neurological: She is alert and oriented to person, place, and time.  5/5 motor and alternatives. Sensation fully intact.  Skin: Skin is warm and dry. Capillary refill takes  less than 2 seconds. No rash noted. She is not diaphoretic. No erythema.  Psychiatric: She has a normal mood and affect. Her behavior is normal.  Nursing note and vitals reviewed.    ED Treatments / Results  Labs (all labs ordered are listed, but only abnormal results are displayed) Labs Reviewed  CBC WITH DIFFERENTIAL/PLATELET - Abnormal; Notable for the following:  Result Value   RBC 3.06 (*)    Hemoglobin 8.5 (*)    HCT 25.6 (*)    All other components within normal limits  COMPREHENSIVE METABOLIC PANEL - Abnormal; Notable for the following:    Glucose, Bld 157 (*)    BUN 23 (*)    Creatinine, Ser 1.04 (*)    Albumin 2.9 (*)    AST 49 (*)    Alkaline Phosphatase 603 (*)    GFR calc non Af Amer 52 (*)    All other components within normal limits  BRAIN NATRIURETIC PEPTIDE - Abnormal; Notable for the following:    B Natriuretic Peptide 378.8 (*)    All other components within normal limits  D-DIMER, QUANTITATIVE (NOT AT [facility]) - Abnormal; Notable for the following:    D-Dimer, Quant 2.76 (*)    All other components within normal limits  URINALYSIS, ROUTINE W REFLEX MICROSCOPIC - Abnormal; Notable for the following:    Color, Urine STRAW (*)    Protein, ur 30 (*)    Squamous Epithelial / LPF 0-5 (*)    All other components within normal limits  TROPONIN I - Abnormal; Notable for the following:    Troponin I 0.05 (*)    All other components within normal limits  GLUCOSE, CAPILLARY - Abnormal; Notable for the following:    Glucose-Capillary 138 (*)    All other components within normal limits  TSH - Abnormal; Notable for the following:    TSH 4.797 (*)    All other components within normal limits  BASIC METABOLIC PANEL - Abnormal; Notable for the following:    Potassium 3.4 (*)    Chloride 97 (*)    Glucose, Bld 162 (*)    BUN 23 (*)    Creatinine, Ser 1.25 (*)    GFR calc non Af Amer 42 (*)    GFR calc Af Amer 49 (*)    All other components within normal limits   GLUCOSE, CAPILLARY - Abnormal; Notable for the following:    Glucose-Capillary 345 (*)    All other components within normal limits  GLUCOSE, CAPILLARY - Abnormal; Notable for the following:    Glucose-Capillary 141 (*)    All other components within normal limits  LIPASE, BLOOD  T4, FREE  CORTISOL  TROPONIN I  I-STAT TROPOININ, ED    EKG  EKG Interpretation  Date/Time:  Tuesday December 30 2016 21:49:40 EDT Ventricular Rate:  110 PR Interval:    QRS Duration: 71 QT Interval:  311 QTC Calculation: 421 R Axis:   39 Text Interpretation:  Sinus tachycardia Borderline repolarization abnormality Confirmed by Lita Mains  MD, Adit Riddles ([id number]) on 12/30/2016 10:04:39 PM Also confirmed by Lita Mains  MD, Chellsea Beckers ([id number]), editor Hattie Perch ([id number])  on 12/31/2016 7:02:13 AM       Radiology Dg Chest 2 View  Result Date: 12/30/2016 CLINICAL DATA:  Shortness of breath. Bilateral lower extremity swelling. EXAM: CHEST  2 VIEW COMPARISON:  Most recent radiographs 12/25/2016. Most recent CT 10/27/2016 FINDINGS: Tip of the right chest port in the mid SVC. The heart is normal in size. Mediastinal contours are unchanged. No pulmonary edema. Possible trace pleural effusions. Calcified granuloma in the left lower lobe. No confluent consolidation. No pneumothorax. No acute osseous abnormalities. Biliary stent in the upper abdomen is partially included. IMPRESSION: Suspect small pleural effusions. No pulmonary edema. No other acute abnormality. Electronically Signed   By: Jeb Levering M.D.   On: 12/30/2016 22:28   Ct Angio  Chest Pe W And/or Wo Contrast  Result Date: 12/31/2016 CLINICAL DATA:  71 year old female with shortness of breath and tachycardia. History of pancreatic cancer. EXAM: CT ANGIOGRAPHY CHEST WITH CONTRAST TECHNIQUE: Multidetector CT imaging of the chest was performed using the standard protocol during bolus administration of intravenous contrast. Multiplanar CT image reconstructions and  MIPs were obtained to evaluate the vascular anatomy. CONTRAST:  100 cc Isovue 370 COMPARISON:  Chest radiograph dated 12/30/2016 and chest CT dated 10/27/2016 FINDINGS: Cardiovascular: There is mild cardiomegaly. There is a small pericardial effusion measuring approximately 7 mm in greatest axial thickness. The thoracic aorta is unremarkable. The origins of the great vessels of the aortic arch appear patent. There is no CT evidence of pulmonary embolism. Mediastinum/Nodes: Left hilar calcified granuloma. Right hilar adenopathy measuring up to 15 mm in short axis. There is mild thickened appearance of the distal esophagus which may be related to chronic irritation from reflux and esophagitis. Clinical correlation is recommended. There is a 4 mm right thyroid calcific focus. There is a right-sided Port-A-Cath with tip in the right atrium. Lungs/Pleura: There are small bilateral pleural effusions. There is diffuse vascular and interstitial prominence, compatible with a degree of congestion and edema. Bilateral upper lobe predominant ground-glass and nodular densities noted suspicious for developing pneumonia. Clinical correlation and follow-up to resolution recommended. There is an 8 mm left lower lobe subpleural calcified granuloma. There is no pneumothorax. The central airways are patent. Upper Abdomen: There is pneumobilia, likely related to sphincterotomy. Small splenic calcified granuloma. Musculoskeletal: Degenerative changes of the spine. There is compression deformity of the superior endplate of the S56, age indeterminate, new compared to the study of 10/27/2016. A pathologic fracture is not entirely excluded. Review of the MIP images confirms the above findings. IMPRESSION: 1. No CT evidence of pulmonary embolism. 2. Cardiomegaly with evidence of CHF and mild interstitial edema. Bilateral upper lobe ground-glass and nodular densities most consistent with superimposed pneumonia. Correlation with clinical exam  and follow-up resolution recommended. Small bilateral pleural effusions noted. 3. Right hilar adenopathy, likely reactive. 4. Thickened appearance of the distal esophagus, likely related to chronic irritation and esophagitis. 5. Mild compression deformity of the superior endplate of the C12, new compared to prior study of 10/27/2016. A pathologic fracture is not entirely excluded. Electronically Signed   By: Anner Crete M.D.   On: 12/31/2016 00:45    Procedures Procedures (including critical care time)  Medications Ordered in ED Medications  acetaminophen (TYLENOL) tablet 650 mg (not administered)  insulin glargine (LANTUS) injection 15 Units (15 Units Subcutaneous Given 12/31/16 1455)  levofloxacin (LEVAQUIN) tablet 750 mg (not administered)  oxyCODONE-acetaminophen (PERCOCET/ROXICET) 5-325 MG per tablet 1-2 tablet (2 tablets Oral Given 12/31/16 0830)  amLODipine (NORVASC) tablet 10 mg (10 mg Oral Given 12/31/16 0143)  lidocaine-prilocaine (EMLA) cream (not administered)  ondansetron (ZOFRAN) tablet 8 mg (not administered)  pantoprazole (PROTONIX) EC tablet 40 mg (40 mg Oral Given 12/31/16 0832)  carvedilol (COREG) tablet 25 mg (25 mg Oral Given 12/31/16 1458)  DULoxetine (CYMBALTA) DR capsule 60 mg (60 mg Oral Given 12/31/16 0829)  hydrALAZINE (APRESOLINE) injection 5 mg (not administered)  sodium chloride flush (NS) 0.9 % injection 3 mL (3 mLs Intravenous Given 12/31/16 0832)  sodium chloride flush (NS) 0.9 % injection 3 mL (not administered)  0.9 %  sodium chloride infusion (not administered)  heparin injection 5,000 Units (5,000 Units Subcutaneous Given 12/31/16 1455)  aspirin EC tablet 81 mg (81 mg Oral Given 12/31/16 0833)  zolpidem (AMBIEN)  tablet 5 mg (not administered)  insulin aspart (novoLOG) injection 0-9 Units (1 Units Subcutaneous Given 12/31/16 1245)  furosemide (LASIX) injection 40 mg (40 mg Intravenous Given 12/31/16 0328)  metoprolol tartrate (LOPRESSOR) injection 5 mg (not  administered)  sodium chloride flush (NS) 0.9 % injection 10-40 mL (10 mLs Intracatheter Given 12/31/16 1103)  furosemide (LASIX) injection 40 mg (40 mg Intravenous Given 12/30/16 2359)  iopamidol (ISOVUE-370) 76 % injection 100 mL (100 mLs Intravenous Contrast Given 12/31/16 0020)  hydrALAZINE (APRESOLINE) injection 10 mg (10 mg Intravenous Given 12/31/16 0144)  hydrALAZINE (APRESOLINE) injection 10 mg (10 mg Intravenous Given 12/31/16 0327)     Initial Impression / Assessment and Plan / ED Course  I have reviewed the triage vital signs and the nursing notes.  Pertinent labs & imaging results that were available during my care of the patient were reviewed by me and considered in my medical decision making (see chart for details).    Chest x-ray without acute findings. Concern for fluid overload. Given IV Lasix in the emergency department. D-dimer came back elevated. We'll get CT imaging of the chest. Discussed with hospitalist and we'll see patient in the emergency department and admit.  Final Clinical Impressions(s) / ED Diagnoses   Final diagnoses:  Acute pulmonary edema [facility])    New Prescriptions Current Discharge Medication List       Julianne Rice, MD 12/31/16 1527

## 2016-12-30 NOTE — ED Triage Notes (Signed)
Pt complains of being short of breath and very weak Pt was in the hospital last week and states that she hasn't felt good since She noticed both her feet swelling and she feels very bloated

## 2016-12-30 NOTE — H&P (Signed)
History and Physical    Patricia Austin CZY:[id number] DOB: [birth year] DOA: 12/30/2016  Referring MD/NP/PA:   PCP: Chevis Pretty, FNP   Patient coming from:  The patient is coming from home.  At baseline, pt is independent for most of ADL.   Chief Complaint: SOB  HPI: Patricia Austin is a 71 y.o. female with medical history significant of hypertension, diabetes mellitus, GERD, depression, fibromyalgia, CK-3, chronic back pain, pancreatic cancer on chemotherapy, who presents with SOB.  Pt was recently hospitalized from 7/4-7/6 due to SIRS with unclear source of infection. Patient was discharged on Levaquin (suppose to finish on 12/21/16). She does not have fever anymore. Patient states that she's had increasing shortness of breath especially with exertion and lying flat. She speaks in full sentence. She also has bilateral lower leg and foot edema. Denies tenderness calf areas. Patient does not have chest pain, cough, fever or chills. Patient does not have nausea, vomiting, diarrhea, abdominal pain, symptoms of UTI or unilateral weakness.  ED Course: pt was found to have BNP 378.8, d-dimer 2.76, WBC 8.5, negative urinalysis, stable renal function, temperature normal, tachycardia, tachypnea, O2 sat 90% on room air, chest x-ray showed small pleural effusion without infiltration. Patient is admitted to telemetry bed as inpatient.   CT angiogram of chest showed 1. No CT evidence of pulmonary embolism. 2. Cardiomegaly with evidence of CHF and mild interstitial edema. Bilateral upper lobe ground-glass and nodular densities most consistent with superimposed pneumonia. Correlation with clinical  exam and follow-up resolution recommended. Small bilateral pleural effusions noted. 3. Right hilar adenopathy, likely reactive.  4. Thickened appearance of the distal esophagus, likely related to chronic irritation and esophagitis. 5. Mild compression deformity of the superior endplate of the U93, new compared to  prior study of 10/27/2016. A pathologic fracture is not entirely excluded.  Review of Systems:   General: no fevers, chills, no changes in body weight, has poor appetite, has fatigue HEENT: no blurry vision, hearing changes or sore throat Respiratory: has dyspnea, no coughing, wheezing CV: no chest pain, no palpitations GI: no nausea, vomiting, abdominal pain, diarrhea, constipation GU: no dysuria, burning on urination, increased urinary frequency, hematuria  Ext: has leg edema Neuro: no unilateral weakness, numbness, or tingling, no vision change or hearing loss Skin: no rash, no skin tear. MSK: No muscle spasm, no deformity, no limitation of range of movement in spin Heme: No easy bruising.  Travel history: No recent long distant travel.  Allergy: No Known Allergies  Past Medical History:  Diagnosis Date  . Anxiety    "not all the time"   . Chronic lower back pain   . CKD (chronic kidney disease) stage 3, GFR 30-59 ml/min   . Complication of anesthesia 2002   difficulty /w intubation, with plastic surgery , because she was feeling so anxious. 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 .   Pt. reports that she  is very sensitive to med. & is hard  to wake up.  . Depression    "not all the time"   . Difficult intubation 2002   with head surgery 2002 has done ok with later intubations  . Dysrhythmia    told that she sometimes has extra beats. Does not see cardiologist, had a stress test many yrs. ago>10 yrs. ...update 11-14-16 " i havent had that feelign in a long time "  . Esophageal dysmotility    "getting worse recenly" (12/15/2013)  . Fibromyalgia   . Hypertension   .  Nephritis ~ 1955  . Osteoarthritis    knees   . Pancreatic cancer ([place])   . Pancreatic mass   . Renal insufficiency   . Seasonal allergies    "in the spring"  . Shortness of breath    walking long distances   . squamous cell carcinoma scalp    squamous, on scalp, & has had several surgeries on  scalp, for plastics repair of scalp   . Type II diabetes mellitus ([place])     Past Surgical History:  Procedure Laterality Date  . ABDOMINAL HYSTERECTOMY     complete  . APPENDECTOMY    . BACK SURGERY  07/2016   L 4 and L 5 fusion done [facility]   . CARPAL TUNNEL RELEASE Right   . CARPAL TUNNEL RELEASE Left   . COLONOSCOPY    . CYST EXCISION  1990's?   scalp  . ESOPHAGOGASTRODUODENOSCOPY N/A 09/26/2016   Procedure: ESOPHAGOGASTRODUODENOSCOPY (EGD);  Surgeon: Gatha Mayer, MD;  Location[facility] ENDOSCOPY;  Service: Endoscopy;  Laterality: N/A;  . EUS N/A 10/09/2016   Procedure: UPPER ENDOSCOPIC ULTRASOUND (EUS) LINEAR;  Surgeon: Milus Banister, MD;  Location: [facility] ENDOSCOPY;  Service: Endoscopy;  Laterality: N/A;  . IR BILIARY STENT(S) EXISTING ACCESS INC DILATION CATH EXCHANGE  10/09/2016  . IR CHOLANGIOGRAM EXISTING TUBE  10/15/2016  . IR FLUORO GUIDE PORT INSERTION RIGHT  11/19/2016  . IR INT EXT BILIARY DRAIN WITH CHOLANGIOGRAM  09/29/2016   2 gallstones  . IR REMOVAL BILIARY DRAIN  10/20/2016  . IR US GUIDE VASC ACCESS RIGHT  11/19/2016  . LUMBAR DISC SURGERY  ~ 2012 X 2   double fusion ; last was 08-21-15  . RADIOLOGY WITH ANESTHESIA N/A 11/19/2016   Procedure: PORT PLACEMENT WITH GENERAL ANESTHESIA;  Surgeon: Arne Cleveland, MD;  Location: [facility] ORS;  Service: Anesthesiology;  Laterality: N/A;  . SKIN GRAFT  2002   scalp; "went to [facility] to repair OR earlier in the yearr"  . SQUAMOUS CELL CARCINOMA EXCISION  2002   "off scalp; cancerous area removed and stapled; Dr. Georgia Lopes"  . TONSILLECTOMY  age 53  . TOTAL KNEE ARTHROPLASTY Left 12/14/2013   Procedure: LEFT TOTAL KNEE ARTHROPLASTY;  Surgeon: Ninetta Lights, MD;  Location: [place];  Service: Orthopedics;  Laterality: Left;  . TOTAL KNEE ARTHROPLASTY Left 12/15/2013    Social History:  reports that she has never smoked. She has never used smokeless tobacco. She reports that she does not drink alcohol or use drugs.  Family History:  Family  History  Problem Relation Age of Onset  . Early death Mother   . Thrombosis Father      Prior to Admission medications   Medication Sig Start Date End Date Taking? Authorizing Provider  acetaminophen (TYLENOL) 500 MG tablet Take 500-1,000 mg by mouth every 6 (six) hours as needed for moderate pain.   Yes [provider]  amLODipine (NORVASC) 5 MG tablet Take 1 tablet (5 mg total) by mouth daily. 11/24/16  Yes Hassell Done, Mary-Margaret, FNP  carvedilol (COREG) 25 MG tablet TAKE  (1)  TABLET TWICE A DAY WITH MEALS (BREAKFAST AND SUPPER) Patient taking differently: Take 25 mg by mouth 2 (two) times daily with a meal.  07/18/16  Yes Hassell Done, Mary-Margaret, FNP  DULoxetine (CYMBALTA) 60 MG capsule Take 1 capsule (60 mg total) by mouth 2 (two) times daily. 07/18/16  Yes Martin, Mary-Margaret, FNP  Insulin Glargine (TOUJEO SOLOSTAR) 300 UNIT/ML SOPN Inject 20 Units into the skin daily.  12/29/16  Yes Eustaquio Maize, MD  insulin lispro (HUMALOG) 100 UNIT/ML injection Up to 15u with each meal Patient taking differently: Inject 1-15 Units into the skin 3 (three) times daily with meals. Sliding scale 11/24/16  Yes Hassell Done, Mary-Margaret, FNP  levofloxacin (LEVAQUIN) 750 MG tablet Take 1 tablet (750 mg total) by mouth every other day. 12/27/16 01/04/17 Yes Dessa Phi Chahn-Yang, DO  oxyCODONE-acetaminophen (PERCOCET/ROXICET) 5-325 MG tablet Take 1-2 tablets by mouth 2 (two) times daily. Patient taking differently: Take 1 tablet by mouth every 6 (six) hours as needed for severe pain.  12/23/16  Yes Hassell Done, Mary-Margaret, FNP  pantoprazole (PROTONIX) 40 MG tablet Take 1 tablet (40 mg total) by mouth daily before breakfast. 09/26/16  Yes Gatha Mayer, MD  glucose blood (ONE TOUCH ULTRA TEST) test strip Test up to qid. DX:E11.65 11/13/16   Hassell Done Mary-Margaret, FNP  lidocaine-prilocaine (EMLA) cream Apply to port site one hour prior to use. Do not rub in. Cover with plastic. 11/10/16   Ladell Pier, MD    ondansetron (ZOFRAN) 8 MG tablet Take 1 tablet (8 mg total) by mouth every 8 (eight) hours as needed for nausea or vomiting. 11/10/16   Ladell Pier, MD    Physical Exam: Vitals:   12/31/16 0000 12/31/16 0106 12/31/16 0130 12/31/16 0314  BP: (!) 183/82 (!) 187/86 (!) 193/84 (!) 180/77  Pulse: (!) 114 (!) 108  (!) 122  Resp: 19 (!) 21    Temp:  98.7 F (37.1 C) 99 F (37.2 C)   TempSrc:  Oral Oral   SpO2:  96% 97%   Weight:      Height:       General: Not in acute distress HEENT:       Eyes: PERRL, EOMI, no scleral icterus.       ENT: No discharge from the ears and nose, no pharynx injection, no tonsillar enlargement.        Neck: positive JVD, no bruit, no mass felt. Heme: No neck lymph node enlargement. Cardiac: S1/S2, RRR, No murmurs, No gallops or rubs. Respiratory:  No rales, wheezing, rhonchi or rubs. GI: Soft, nondistended, nontender, no rebound pain, no organomegaly, BS present. GU: No hematuria Ext: 1+ pitting leg edema bilaterally. 2+DP/PT pulse bilaterally. Musculoskeletal: No joint deformities, No joint redness or warmth, no limitation of ROM in spin. Skin: No rashes.  Neuro: Alert, oriented X3, cranial nerves II-XII grossly intact, moves all extremities normally. Psych: Patient is not psychotic, no suicidal or hemocidal ideation.  Labs on Admission: I have personally reviewed following labs and imaging studies  CBC:  Recent Labs Lab 12/24/16 2354 12/25/16 0520 12/26/16 0500 12/30/16 2223  WBC 15.8* 13.0* 6.7 8.5  NEUTROABS 15.4*  --  5.7 6.3  HGB 8.7* 7.4* 7.4* 8.5*  HCT 25.6* 21.6* 22.3* 25.6*  MCV 83.7 83.4 84.8 83.7  PLT 319 250 293 466   Basic Metabolic Panel:  Recent Labs Lab 12/24/16 2354 12/25/16 0520 12/26/16 0500 12/30/16 2223  NA 132* 133* 136 138  K 4.9 3.7 3.8 3.6  CL 101 103 107 102  CO2 25 23 23 27   GLUCOSE 147* 97 199* 157*  BUN 19 19 26* 23*  CREATININE 1.05* 1.09* 1.33* 1.04*  CALCIUM 8.6* 8.1* 8.3* 9.5    GFR: Estimated Creatinine Clearance: 50.6 mL/min (A) (by C-G formula based on SCr of 1.04 mg/dL (H)). Liver Function Tests:  Recent Labs Lab 12/24/16 2354 12/25/16 0520 12/30/16 2223  AST 155* 119* 49*  ALT 71* 61* 36  ALKPHOS 486* 372* 603*  BILITOT 0.9 0.7 0.4  PROT 5.6* 5.6* 6.8  ALBUMIN 2.3* 2.2* 2.9*    Recent Labs Lab 12/25/16 0520 12/30/16 2223  LIPASE 12 26   No results for input(s): AMMONIA in the last 168 hours. Coagulation Profile: No results for input(s): INR, PROTIME in the last 168 hours. Cardiac Enzymes: No results for input(s): CKTOTAL, CKMB, CKMBINDEX, TROPONINI in the last 168 hours. BNP (last 3 results) No results for input(s): PROBNP in the last 8760 hours. HbA1C: No results for input(s): HGBA1C in the last 72 hours. CBG:  Recent Labs Lab 12/25/16 1629 12/25/16 2139 12/26/16 0716 12/26/16 1133 12/31/16 0134  GLUCAP 116* 249* 154* 82 138*   Lipid Profile: No results for input(s): CHOL, HDL, LDLCALC, TRIG, CHOLHDL, LDLDIRECT in the last 72 hours. Thyroid Function Tests: No results for input(s): TSH, T4TOTAL, FREET4, T3FREE, THYROIDAB in the last 72 hours. Anemia Panel: No results for input(s): VITAMINB12, FOLATE, FERRITIN, TIBC, IRON, RETICCTPCT in the last 72 hours. Urine analysis:    Component Value Date/Time   COLORURINE STRAW (A) 12/30/2016 2139   APPEARANCEUR CLEAR 12/30/2016 2139   LABSPEC 1.005 12/30/2016 2139   PHURINE 6.0 12/30/2016 2139   GLUCOSEU NEGATIVE 12/30/2016 2139   HGBUR NEGATIVE 12/30/2016 2139   BILIRUBINUR NEGATIVE 12/30/2016 2139   BILIRUBINUR negative 04/06/2013 1723   KETONESUR NEGATIVE 12/30/2016 2139   PROTEINUR 30 (A) 12/30/2016 2139   UROBILINOGEN 1.0 12/06/2013 1244   NITRITE NEGATIVE 12/30/2016 2139   LEUKOCYTESUR NEGATIVE 12/30/2016 2139   Sepsis Labs: @LABRCNTIP (procalcitonin:4,lacticidven:4) ) Recent Results (from the past 240 hour(s))  Blood Culture (routine x 2)     Status: None    Collection Time: 12/24/16 11:57 PM  Result Value Ref Range Status   Specimen Description BLOOD LEFT ANTECUBITAL  Final   Special Requests   Final    BOTTLES DRAWN AEROBIC AND ANAEROBIC Blood Culture adequate volume   Culture   Final    NO GROWTH 5 DAYS Performed at [facility], [place] [address], [place], [place] [address]    Report Status 12/30/2016 FINAL  Final  Blood Culture (routine x 2)     Status: None   Collection Time: 12/24/16 11:57 PM  Result Value Ref Range Status   Specimen Description BLOOD RIGHT PORT  Final   Special Requests   Final    BOTTLES DRAWN AEROBIC AND ANAEROBIC Blood Culture adequate volume   Culture   Final    NO GROWTH 5 DAYS Performed at [facility], [address], [place], [place] [address]    Report Status 12/30/2016 FINAL  Final  Urine Culture     Status: Abnormal   Collection Time: 12/25/16  1:48 AM  Result Value Ref Range Status   Specimen Description URINE, RANDOM  Final   Special Requests NONE  Final   Culture MULTIPLE SPECIES PRESENT, SUGGEST RECOLLECTION (A)  Final   Report Status 12/26/2016 FINAL  Final     Radiological Exams on Admission: Dg Chest 2 View  Result Date: 12/30/2016 CLINICAL DATA:  Shortness of breath. Bilateral lower extremity swelling. EXAM: CHEST  2 VIEW COMPARISON:  Most recent radiographs 12/25/2016. Most recent CT 10/27/2016 FINDINGS: Tip of the right chest port in the mid SVC. The heart is normal in size. Mediastinal contours are unchanged. No pulmonary edema. Possible trace pleural effusions. Calcified granuloma in the left lower lobe. No confluent consolidation. No pneumothorax. No acute osseous abnormalities. Biliary stent  in the upper abdomen is partially included. IMPRESSION: Suspect small pleural effusions. No pulmonary edema. No other acute abnormality. Electronically Signed   By: Jeb Levering M.D.   On: 12/30/2016 22:28   Ct Angio Chest Pe W And/or Wo Contrast  Result Date: 12/31/2016 CLINICAL  DATA:  71 year old female with shortness of breath and tachycardia. History of pancreatic cancer. EXAM: CT ANGIOGRAPHY CHEST WITH CONTRAST TECHNIQUE: Multidetector CT imaging of the chest was performed using the standard protocol during bolus administration of intravenous contrast. Multiplanar CT image reconstructions and MIPs were obtained to evaluate the vascular anatomy. CONTRAST:  100 cc Isovue 370 COMPARISON:  Chest radiograph dated 12/30/2016 and chest CT dated 10/27/2016 FINDINGS: Cardiovascular: There is mild cardiomegaly. There is a small pericardial effusion measuring approximately 7 mm in greatest axial thickness. The thoracic aorta is unremarkable. The origins of the great vessels of the aortic arch appear patent. There is no CT evidence of pulmonary embolism. Mediastinum/Nodes: Left hilar calcified granuloma. Right hilar adenopathy measuring up to 15 mm in short axis. There is mild thickened appearance of the distal esophagus which may be related to chronic irritation from reflux and esophagitis. Clinical correlation is recommended. There is a 4 mm right thyroid calcific focus. There is a right-sided Port-A-Cath with tip in the right atrium. Lungs/Pleura: There are small bilateral pleural effusions. There is diffuse vascular and interstitial prominence, compatible with a degree of congestion and edema. Bilateral upper lobe predominant ground-glass and nodular densities noted suspicious for developing pneumonia. Clinical correlation and follow-up to resolution recommended. There is an 8 mm left lower lobe subpleural calcified granuloma. There is no pneumothorax. The central airways are patent. Upper Abdomen: There is pneumobilia, likely related to sphincterotomy. Small splenic calcified granuloma. Musculoskeletal: Degenerative changes of the spine. There is compression deformity of the superior endplate of the D62, age indeterminate, new compared to the study of 10/27/2016. A pathologic fracture is not  entirely excluded. Review of the MIP images confirms the above findings. IMPRESSION: 1. No CT evidence of pulmonary embolism. 2. Cardiomegaly with evidence of CHF and mild interstitial edema. Bilateral upper lobe ground-glass and nodular densities most consistent with superimposed pneumonia. Correlation with clinical exam and follow-up resolution recommended. Small bilateral pleural effusions noted. 3. Right hilar adenopathy, likely reactive. 4. Thickened appearance of the distal esophagus, likely related to chronic irritation and esophagitis. 5. Mild compression deformity of the superior endplate of the I29, new compared to prior study of 10/27/2016. A pathologic fracture is not entirely excluded. Electronically Signed   By: Anner Crete M.D.   On: 12/31/2016 00:45     EKG: Independently reviewed.  Sinus rhythm, QTC 421, anteroseptal infarction pattern.   Assessment/Plan Principal Problem:   Acute on chronic respiratory failure with hypoxia (HCC) Active Problems:   Hypertension   GERD (gastroesophageal reflux disease)   CKD (chronic kidney disease) stage 3, GFR 30-59 ml/min   Pancreatic cancer (HCC)   DM (diabetes mellitus), type 2, uncontrolled (HCC)   Acute respiratory failure with hypoxia (HCC)   Acute on chronic respiratory failure with hypoxia ([place]): Etiology is not clear. CT angiogram of chest is negative for PE, but showed interstitial edema, indicating possible CHF. BNP is elevated at 378, which is consistent with CHF. Patient may have newly developed diastolic CHF given long hx of HTN. -will admit to tele bed as inpt. -Lasix 40 mg bid by IV -trop x 3 -2d echo -ASA and coreg -Daily weights -strict I/O's -Low salt diet  Hypertension: Blood pressure  197/91-->129/63 -Increase amlodipine dose from 5-10 mg daily -IV hydralazine when necessary -Continue home Coreg  GERD: -Protonix  CKD (chronic kidney disease) stage 3, GFR 30-59 ml/min: stable. Cre 1.04 -Follow up renal  function by BMP  DM-II: Last A1c  9.8 on 11/14/16, poorly controled. Patient is taking glargine insulin and Humalog at home -will decrease Lantus dose from 20-15 units daily -SSI  Pancreatic cancer [facility]): On chemotherapy, last dose was on Monday. Patient is followed up by Dr. Benay Spice. -Follow-up with Dr.  Benay Spice. -Will have to Dr. Benay Spice to treatment team   DVT ppx: SQ Heparin  Code Status: Full code Family Communication: Yes, patient's husband at bed side Disposition Plan:  Anticipate discharge back to previous home environment Consults called:  None Admission status: Inpatient/tele    Date of Service 12/31/2016    Ivor Costa [facility] Pager [phone]  If 7PM-7AM, please contact night-coverage [email or web address] Password [facility] 12/31/2016, 3:38 AM

## 2016-12-31 ENCOUNTER — Inpatient Hospital Stay (HOSPITAL_COMMUNITY): Payer: Medicare Other

## 2016-12-31 DIAGNOSIS — J9621 Acute and chronic respiratory failure with hypoxia: Secondary | ICD-10-CM | POA: Diagnosis not present

## 2016-12-31 DIAGNOSIS — J9601 Acute respiratory failure with hypoxia: Secondary | ICD-10-CM | POA: Diagnosis present

## 2016-12-31 DIAGNOSIS — E1122 Type 2 diabetes mellitus with diabetic chronic kidney disease: Secondary | ICD-10-CM | POA: Diagnosis present

## 2016-12-31 DIAGNOSIS — Z85828 Personal history of other malignant neoplasm of skin: Secondary | ICD-10-CM | POA: Diagnosis not present

## 2016-12-31 DIAGNOSIS — I361 Nonrheumatic tricuspid (valve) insufficiency: Secondary | ICD-10-CM

## 2016-12-31 DIAGNOSIS — J810 Acute pulmonary edema: Secondary | ICD-10-CM

## 2016-12-31 DIAGNOSIS — Z981 Arthrodesis status: Secondary | ICD-10-CM | POA: Diagnosis not present

## 2016-12-31 DIAGNOSIS — Z9221 Personal history of antineoplastic chemotherapy: Secondary | ICD-10-CM | POA: Diagnosis not present

## 2016-12-31 DIAGNOSIS — I130 Hypertensive heart and chronic kidney disease with heart failure and stage 1 through stage 4 chronic kidney disease, or unspecified chronic kidney disease: Secondary | ICD-10-CM | POA: Diagnosis present

## 2016-12-31 DIAGNOSIS — D500 Iron deficiency anemia secondary to blood loss (chronic): Secondary | ICD-10-CM | POA: Diagnosis present

## 2016-12-31 DIAGNOSIS — C258 Malignant neoplasm of overlapping sites of pancreas: Secondary | ICD-10-CM | POA: Diagnosis not present

## 2016-12-31 DIAGNOSIS — K210 Gastro-esophageal reflux disease with esophagitis: Secondary | ICD-10-CM | POA: Diagnosis present

## 2016-12-31 DIAGNOSIS — C259 Malignant neoplasm of pancreas, unspecified: Secondary | ICD-10-CM | POA: Diagnosis present

## 2016-12-31 DIAGNOSIS — Z96652 Presence of left artificial knee joint: Secondary | ICD-10-CM | POA: Diagnosis present

## 2016-12-31 DIAGNOSIS — M797 Fibromyalgia: Secondary | ICD-10-CM | POA: Diagnosis present

## 2016-12-31 DIAGNOSIS — R000 Tachycardia, unspecified: Secondary | ICD-10-CM | POA: Diagnosis not present

## 2016-12-31 DIAGNOSIS — I248 Other forms of acute ischemic heart disease: Secondary | ICD-10-CM | POA: Diagnosis present

## 2016-12-31 DIAGNOSIS — Z794 Long term (current) use of insulin: Secondary | ICD-10-CM | POA: Diagnosis not present

## 2016-12-31 DIAGNOSIS — D638 Anemia in other chronic diseases classified elsewhere: Secondary | ICD-10-CM | POA: Diagnosis present

## 2016-12-31 DIAGNOSIS — N183 Chronic kidney disease, stage 3 (moderate): Secondary | ICD-10-CM | POA: Diagnosis present

## 2016-12-31 DIAGNOSIS — N179 Acute kidney failure, unspecified: Secondary | ICD-10-CM | POA: Diagnosis present

## 2016-12-31 DIAGNOSIS — Z79899 Other long term (current) drug therapy: Secondary | ICD-10-CM | POA: Diagnosis not present

## 2016-12-31 DIAGNOSIS — I5033 Acute on chronic diastolic (congestive) heart failure: Secondary | ICD-10-CM | POA: Diagnosis present

## 2016-12-31 DIAGNOSIS — G8929 Other chronic pain: Secondary | ICD-10-CM | POA: Diagnosis present

## 2016-12-31 LAB — GLUCOSE, CAPILLARY
GLUCOSE-CAPILLARY: 138 mg/dL — AB (ref 65–99)
GLUCOSE-CAPILLARY: 141 mg/dL — AB (ref 65–99)
GLUCOSE-CAPILLARY: 192 mg/dL — AB (ref 65–99)
Glucose-Capillary: 345 mg/dL — ABNORMAL HIGH (ref 65–99)

## 2016-12-31 LAB — BASIC METABOLIC PANEL WITH GFR
Anion gap: 10 (ref 5–15)
BUN: 23 mg/dL — ABNORMAL HIGH (ref 6–20)
CO2: 30 mmol/L (ref 22–32)
Calcium: 9.1 mg/dL (ref 8.9–10.3)
Chloride: 97 mmol/L — ABNORMAL LOW (ref 101–111)
Creatinine, Ser: 1.25 mg/dL — ABNORMAL HIGH (ref 0.44–1.00)
GFR calc Af Amer: 49 mL/min — ABNORMAL LOW
GFR calc non Af Amer: 42 mL/min — ABNORMAL LOW
Glucose, Bld: 162 mg/dL — ABNORMAL HIGH (ref 65–99)
Potassium: 3.4 mmol/L — ABNORMAL LOW (ref 3.5–5.1)
Sodium: 137 mmol/L (ref 135–145)

## 2016-12-31 LAB — ECHOCARDIOGRAM COMPLETE
HEIGHTINCHES: 63 in
WEIGHTICAEL: 2998.26 [oz_av]

## 2016-12-31 LAB — TROPONIN I
TROPONIN I: 0.05 ng/mL — AB (ref <0.03)
TROPONIN I: 0.08 ng/mL — AB (ref <0.03)

## 2016-12-31 LAB — T4, FREE: Free T4: 1.01 ng/dL (ref 0.61–1.12)

## 2016-12-31 LAB — TSH: TSH: 4.797 u[IU]/mL — ABNORMAL HIGH (ref 0.350–4.500)

## 2016-12-31 LAB — CORTISOL: Cortisol, Plasma: 12 ug/dL

## 2016-12-31 MED ORDER — OXYCODONE-ACETAMINOPHEN 5-325 MG PO TABS
1.0000 | ORAL_TABLET | Freq: Two times a day (BID) | ORAL | Status: DC
  Administered 2016-12-31: 2 via ORAL
  Administered 2016-12-31 (×2): 1 via ORAL
  Administered 2017-01-01: 2 via ORAL
  Filled 2016-12-31: qty 1
  Filled 2016-12-31: qty 2
  Filled 2016-12-31: qty 1
  Filled 2016-12-31: qty 2

## 2016-12-31 MED ORDER — METOPROLOL TARTRATE 5 MG/5ML IV SOLN: 5.0000 mg | Freq: Four times a day (QID) | INTRAVENOUS | Status: DC | PRN

## 2016-12-31 MED ORDER — HYDRALAZINE HCL 20 MG/ML IJ SOLN
10.0000 mg | Freq: Once | INTRAMUSCULAR | Status: AC
  Administered 2016-12-31: 10 mg via INTRAVENOUS
  Filled 2016-12-31: qty 1
  Filled 2016-12-31: qty 0.5

## 2016-12-31 MED ORDER — LIDOCAINE-PRILOCAINE 2.5-2.5 % EX CREA: TOPICAL_CREAM | CUTANEOUS | Status: DC | PRN

## 2016-12-31 MED ORDER — HYDRALAZINE HCL 20 MG/ML IJ SOLN
10.0000 mg | Freq: Once | INTRAMUSCULAR | Status: AC
  Administered 2016-12-31: 10 mg via INTRAVENOUS
  Filled 2016-12-31: qty 1

## 2016-12-31 MED ORDER — FUROSEMIDE 10 MG/ML IJ SOLN: 40.0000 mg | Freq: Two times a day (BID) | INTRAMUSCULAR | Status: DC

## 2016-12-31 MED ORDER — HYDRALAZINE HCL 20 MG/ML IJ SOLN
5.0000 mg | INTRAMUSCULAR | Status: DC | PRN
  Filled 2016-12-31: qty 0.25

## 2016-12-31 MED ORDER — SODIUM CHLORIDE 0.9% FLUSH
3.0000 mL | Freq: Two times a day (BID) | INTRAVENOUS | Status: DC
  Administered 2016-12-31 – 2017-01-01 (×4): 3 mL via INTRAVENOUS

## 2016-12-31 MED ORDER — INSULIN GLARGINE 100 UNIT/ML ~~LOC~~ SOLN
15.0000 [IU] | Freq: Every day | SUBCUTANEOUS | Status: DC
  Administered 2016-12-31 – 2017-01-01 (×2): 15 [IU] via SUBCUTANEOUS
  Filled 2016-12-31 (×3): qty 0.15

## 2016-12-31 MED ORDER — HEPARIN SODIUM (PORCINE) 5000 UNIT/ML IJ SOLN
5000.0000 [IU] | Freq: Three times a day (TID) | INTRAMUSCULAR | Status: DC
  Administered 2016-12-31 – 2017-01-01 (×5): 5000 [IU] via SUBCUTANEOUS
  Filled 2016-12-31 (×5): qty 1

## 2016-12-31 MED ORDER — LEVOFLOXACIN 750 MG PO TABS
750.0000 mg | ORAL_TABLET | ORAL | Status: DC
  Administered 2017-01-01: 750 mg via ORAL
  Filled 2016-12-31 (×2): qty 1

## 2016-12-31 MED ORDER — FUROSEMIDE 10 MG/ML IJ SOLN
40.0000 mg | Freq: Two times a day (BID) | INTRAMUSCULAR | Status: DC
  Administered 2016-12-31 – 2017-01-01 (×3): 40 mg via INTRAVENOUS
  Filled 2016-12-31 (×4): qty 4

## 2016-12-31 MED ORDER — SODIUM CHLORIDE 0.9 % IV SOLN: 250.0000 mL | INTRAVENOUS | Status: DC | PRN

## 2016-12-31 MED ORDER — DULOXETINE HCL 30 MG PO CPEP
60.0000 mg | ORAL_CAPSULE | Freq: Two times a day (BID) | ORAL | Status: DC
  Administered 2016-12-31 – 2017-01-01 (×4): 60 mg via ORAL
  Filled 2016-12-31 (×4): qty 2

## 2016-12-31 MED ORDER — ACETAMINOPHEN 325 MG PO TABS: 650.0000 mg | ORAL_TABLET | Freq: Four times a day (QID) | ORAL | Status: DC | PRN

## 2016-12-31 MED ORDER — ASPIRIN EC 81 MG PO TBEC
81.0000 mg | DELAYED_RELEASE_TABLET | Freq: Every day | ORAL | Status: DC
  Administered 2016-12-31 – 2017-01-01 (×2): 81 mg via ORAL
  Filled 2016-12-31 (×2): qty 1

## 2016-12-31 MED ORDER — ONDANSETRON HCL 4 MG PO TABS: 8.0000 mg | ORAL_TABLET | Freq: Three times a day (TID) | ORAL | Status: DC | PRN

## 2016-12-31 MED ORDER — SODIUM CHLORIDE 0.9% FLUSH
10.0000 mL | INTRAVENOUS | Status: DC | PRN
  Administered 2016-12-31 (×2): 10 mL
  Filled 2016-12-31 (×2): qty 40

## 2016-12-31 MED ORDER — CARVEDILOL 25 MG PO TABS
25.0000 mg | ORAL_TABLET | Freq: Two times a day (BID) | ORAL | Status: DC
  Administered 2016-12-31 – 2017-01-01 (×3): 25 mg via ORAL
  Filled 2016-12-31 (×3): qty 1

## 2016-12-31 MED ORDER — AMLODIPINE BESYLATE 5 MG PO TABS
10.0000 mg | ORAL_TABLET | Freq: Every day | ORAL | Status: DC
  Administered 2016-12-31 – 2017-01-01 (×2): 10 mg via ORAL
  Filled 2016-12-31 (×3): qty 2

## 2016-12-31 MED ORDER — INSULIN ASPART 100 UNIT/ML ~~LOC~~ SOLN
0.0000 [IU] | Freq: Three times a day (TID) | SUBCUTANEOUS | Status: DC
  Administered 2016-12-31 (×2): 1 [IU] via SUBCUTANEOUS
  Administered 2016-12-31: 7 [IU] via SUBCUTANEOUS
  Administered 2017-01-01: 2 [IU] via SUBCUTANEOUS
  Administered 2017-01-01: 3 [IU] via SUBCUTANEOUS

## 2016-12-31 MED ORDER — ZOLPIDEM TARTRATE 5 MG PO TABS: 5.0000 mg | ORAL_TABLET | Freq: Every evening | ORAL | Status: DC | PRN

## 2016-12-31 MED ORDER — SODIUM CHLORIDE 0.9% FLUSH: 3.0000 mL | INTRAVENOUS | Status: DC | PRN

## 2016-12-31 MED ORDER — PANTOPRAZOLE SODIUM 40 MG PO TBEC
40.0000 mg | DELAYED_RELEASE_TABLET | Freq: Every day | ORAL | Status: DC
Start: 2016-12-31 — End: 2017-01-01
  Administered 2016-12-31 – 2017-01-01 (×2): 40 mg via ORAL
  Filled 2016-12-31 (×2): qty 1

## 2016-12-31 NOTE — Progress Notes (Signed)
IP PROGRESS NOTE  Subjective:   Patricia Austin was last treated with gemcitabine/Abraxane on 12/22/2016. She is admitted with a fever 12/24/2016. She was treated with antibiotics. No source for infection was identified. She was discharged 12/26/2016. She was admitted yesterday with exertional dyspnea. A chest CT revealed evidence of pulmonary edema and possible pneumonia. She complains of abdominal bloating and foot swelling.  Objective: Vital signs in last 24 hours: Blood pressure (!) 129/55, pulse 93, temperature 98.6 F (37 C), temperature source Oral, resp. rate 20, height 5\' 3"  (1.6 m), weight 187 lb 6.3 oz (85 kg), SpO2 94 %.  Intake/Output from previous day: 07/10 0701 - 07/11 0700 In: 0  Out: 1100 [Urine:1100]  Physical Exam:  HEENT: No thrush Lungs: Clear bilaterally, no respiratory distress Cardiac: Regular rate and rhythm Abdomen: No hepatomegaly, no mass, no apparent ascites Extremities: No leg edema   Portacath/PICC-without erythema  Lab Results:  Recent Labs  12/30/16 2223  WBC 8.5  HGB 8.5*  HCT 25.6*  PLT 260    BMET  Recent Labs  12/30/16 2223 12/31/16 1106  NA 138 137  K 3.6 3.4*  CL 102 97*  CO2 27 30  GLUCOSE 157* 162*  BUN 23* 23*  CREATININE 1.04* 1.25*  CALCIUM 9.5 9.1    Studies/Results: Dg Chest 2 View  Result Date: 12/30/2016 CLINICAL DATA:  Shortness of breath. Bilateral lower extremity swelling. EXAM: CHEST  2 VIEW COMPARISON:  Most recent radiographs 12/25/2016. Most recent CT 10/27/2016 FINDINGS: Tip of the right chest port in the mid SVC. The heart is normal in size. Mediastinal contours are unchanged. No pulmonary edema. Possible trace pleural effusions. Calcified granuloma in the left lower lobe. No confluent consolidation. No pneumothorax. No acute osseous abnormalities. Biliary stent in the upper abdomen is partially included. IMPRESSION: Suspect small pleural effusions. No pulmonary edema. No other acute abnormality.  Electronically Signed   By: Jeb Levering M.D.   On: 12/30/2016 22:28   Ct Angio Chest Pe W And/or Wo Contrast  Result Date: 12/31/2016 CLINICAL DATA:  71 year old female with shortness of breath and tachycardia. History of pancreatic cancer. EXAM: CT ANGIOGRAPHY CHEST WITH CONTRAST TECHNIQUE: Multidetector CT imaging of the chest was performed using the standard protocol during bolus administration of intravenous contrast. Multiplanar CT image reconstructions and MIPs were obtained to evaluate the vascular anatomy. CONTRAST:  100 cc Isovue 370 COMPARISON:  Chest radiograph dated 12/30/2016 and chest CT dated 10/27/2016 FINDINGS: Cardiovascular: There is mild cardiomegaly. There is a small pericardial effusion measuring approximately 7 mm in greatest axial thickness. The thoracic aorta is unremarkable. The origins of the great vessels of the aortic arch appear patent. There is no CT evidence of pulmonary embolism. Mediastinum/Nodes: Left hilar calcified granuloma. Right hilar adenopathy measuring up to 15 mm in short axis. There is mild thickened appearance of the distal esophagus which may be related to chronic irritation from reflux and esophagitis. Clinical correlation is recommended. There is a 4 mm right thyroid calcific focus. There is a right-sided Port-A-Cath with tip in the right atrium. Lungs/Pleura: There are small bilateral pleural effusions. There is diffuse vascular and interstitial prominence, compatible with a degree of congestion and edema. Bilateral upper lobe predominant ground-glass and nodular densities noted suspicious for developing pneumonia. Clinical correlation and follow-up to resolution recommended. There is an 8 mm left lower lobe subpleural calcified granuloma. There is no pneumothorax. The central airways are patent. Upper Abdomen: There is pneumobilia, likely related to sphincterotomy. Small splenic calcified granuloma.  Musculoskeletal: Degenerative changes of the spine. There  is compression deformity of the superior endplate of the A44, age indeterminate, new compared to the study of 10/27/2016. A pathologic fracture is not entirely excluded. Review of the MIP images confirms the above findings. IMPRESSION: 1. No CT evidence of pulmonary embolism. 2. Cardiomegaly with evidence of CHF and mild interstitial edema. Bilateral upper lobe ground-glass and nodular densities most consistent with superimposed pneumonia. Correlation with clinical exam and follow-up resolution recommended. Small bilateral pleural effusions noted. 3. Right hilar adenopathy, likely reactive. 4. Thickened appearance of the distal esophagus, likely related to chronic irritation and esophagitis. 5. Mild compression deformity of the superior endplate of the L75, new compared to prior study of 10/27/2016. A pathologic fracture is not entirely excluded. Electronically Signed   By: Anner Crete M.D.   On: 12/31/2016 00:45    Medications: I have reviewed the patient's current medications.  Assessment/Plan: 1. Adenocarcinoma the pancreas-pancreas head/uncinatemass noted on a CT abdomen/pelvis 09/20/2016, status post an EUS biopsy 10/09/2016 confirming adenocarcinoma  CT 09/20/2016-pancreas head/uncinatemass, possible contact with the superior mesenteric artery, biliary dilatation, small low lung nodules  CTs chest, abdomen, and pelvis 10/27/2016-enlarging pancreas head mass with vascular abutment, no evidence of metastatic disease  Cycle 1 gemcitabine/Abraxane 11/25/2016  Cycle 2 gemcitabine/Abraxane 12/08/2016  Cycle 3 gemcitabine/Abraxane 12/22/2016  2. Biliary obstruction secondary to #1, status post placement of a wall stent and external biliary drain 10/09/2016  Biliary drain removed 10/20/2016  3. Diabetes-admitted with marked hyperglycemia 10/27/2016  4. History of squamous cell carcinoma of the scalp  5. Lumbar fusion surgery February 2018  6. 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 History of Abdominal/back pain secondary to pancreas cancer  8. Cholangitis 10/07/2016  9. Confusionduring hospital admission 10/27/2016-etiology unclear, MRI brain negative for metastatic disease on 10/29/2016, improved  10. Port-A-Cath placement 11/19/2016  11.  Admission 12/30/2016 with dyspnea, chest CT consistent with pulmonary edema  She is admitted with exertional dyspnea. The dyspnea is most likely related to volume overload, potentially in the setting of congestive heart failure. I have a low clinical suspicion for progression of pancreas cancer in the chest or chemotherapy-related pneumonitis. She does not have a cough or fever to suggest pneumonia.  Recommendations:  1. Management of respiratory failure per the medical service.  2. Follow-up as scheduled at the [facility] for an office visit and chemotherapy on 01/05/2017  3. Please call Oncology as needed while she is in the hospital.  I will be out beginning 01/02/2017.       LOS: 0 days   Donneta Romberg, MD   12/31/2016, 5:51 PM

## 2016-12-31 NOTE — Progress Notes (Signed)
  Echocardiogram 2D Echocardiogram has been performed.  Patricia Austin M 12/31/2016, 1:52 PM

## 2016-12-31 NOTE — Progress Notes (Addendum)
[facility] Progress Note  Patient: Patricia Austin KGU:[id number]   PCP: Chevis Pretty, FNP DOB: April 02, 1946   DOA: 12/30/2016   DOS: 12/31/2016   Date of Service: the patient was seen and examined on 12/31/2016  Subjective: still has some shortnes of breath, No chest pain or chest tightness. Patient remains compliant with all her medications. Orthopnea is getting better.  Brief hospital course: Pt. with PMH of hypertension, type II DM, GERD, fibromyalgia, CKD 3, pancreatic cancer on chemotherapy; admitted on 12/30/2016, presented with complaint of shortness of breath, was found to have acute on chronic diastolic CHF. Currently further plan is continue IV Lasix.  Assessment and Plan: Acute on chronic respiratory failure with hypoxia [facility]):  Acute on chronic diastolic dysfunction. Accelerated hypertension. Elevated troponin likely from demand ischemia Last patient patient presented with sepsis like picture and was given aggressive IV hydration.  Weight in that admission was 177, and today after -1L diuresis, weight is 187 Lbs. CT angiogram of chest is negative for PE, but showed interstitial edema, indicating possible CHF.  BNP is elevated at 378, which is consistent with CHF.  Patient may have newly developed diastolic CHF given long hx of HTN. -Lasix 40 mg bid by IV -2d Echocardiogram shows EF 60% -   62%, grade 1 diastolic dysfunction Continue ASA and coreg -Daily weights -strict I/O's -Low salt diet  Accelerated Hypertension:  Blood pressure elevated -Increase amlodipine dose from 5 to 10 mg daily -IV hydralazine when necessary -Continue home Coreg Continue lasix  GERD: -Protonix  CKD (chronic kidney disease) stage 3, GFR 30-59 ml/min: stable. Cre 1.04 -Follow up renal function by BMP  DM-II: Last A1c  9.8 on 11/14/16, poorly controled. Patient is taking glargine insulin and Humalog at home -will decrease Lantus dose from 20-15 units daily -SSI  Pancreatic  cancer [facility]): On chemotherapy, last dose was on Monday. Patient is followed up by Dr. Benay Spice. -Follow-up with Dr.  Benay Spice. -Will add Dr. Benay Spice to treatment team  Confusion. she had some confusion as well as dizziness on presentation. Still has some dizziness without any vertigo. Currently neurologically the examination is normal. Possibility of hypertensive encephalopathy cannot be ruled out Closely monitor on telemetry.  Diet: cardiac diet DVT Prophylaxis: subcutaneous Heparin  Advance goals of care discussion: full code  Family Communication: family was present at bedside, at the time of interview. The pt provided permission to discuss medical plan with the family. Opportunity was given to ask question and all questions were answered satisfactorily.   Disposition:  Discharge to home.  Consultants: none Procedures: Echocardiogram  Antibiotics: Anti-infectives    Start     Dose/Rate Route Frequency Ordered Stop   01/01/17 0800  levofloxacin (LEVAQUIN) tablet 750 mg     750 mg Oral Every 48 hours 12/31/16 0000         Objective: Physical Exam: Vitals:   12/31/16 0130 12/31/16 0314 12/31/16 0516 12/31/16 1547  BP: (!) 193/84 (!) 180/77 (!) 162/73 (!) 129/55  Pulse:  (!) 122 (!) 126 93  Resp:   20   Temp: 99 F (37.2 C)  98.8 F (37.1 C) 98.6 F (37 C)  TempSrc: Oral  Oral Oral  SpO2: 97%  93% 94%  Weight:   85 kg (187 lb 6.3 oz)   Height:        Intake/Output Summary (Last 24 hours) at 12/31/16 1723 Last data filed at 12/31/16 1103  Gross per 24 hour  Intake  10 ml  Output             1100 ml  Net            -1090 ml   Filed Weights   12/30/16 2017 12/31/16 0516  Weight: 85.3 kg (188 lb) 85 kg (187 lb 6.3 oz)   General: Alert, Awake and Oriented to Time, Place and Person. Appear in mild distress, affect appropriate Eyes: PERRL, Conjunctiva normal ENT: Oral Mucosa clear moist. Neck: no JVD, no Abnormal Mass Or lumps Cardiovascular: S1  and S2 Present, no Murmur, Peripheral Pulses Present Respiratory: normal respiratory effort, Bilateral Air entry equal and Decreased, no use of accessory muscle, Clear to Auscultation, no Crackles, no wheezes Abdomen: Bowel Sound present, Soft and no tenderness, no hernia Skin: no redness, no Rash, no induration Extremities: bilateral Pedal edema, no calf tenderness Neurologic: Grossly no focal neuro deficit. Bilaterally Equal motor strength  Data Reviewed: CBC:  Recent Labs Lab 12/24/16 2354 12/25/16 0520 12/26/16 0500 12/30/16 2223  WBC 15.8* 13.0* 6.7 8.5  NEUTROABS 15.4*  --  5.7 6.3  HGB 8.7* 7.4* 7.4* 8.5*  HCT 25.6* 21.6* 22.3* 25.6*  MCV 83.7 83.4 84.8 83.7  PLT 319 250 293 314   Basic Metabolic Panel:  Recent Labs Lab 12/24/16 2354 12/25/16 0520 12/26/16 0500 12/30/16 2223 12/31/16 1106  NA 132* 133* 136 138 137  K 4.9 3.7 3.8 3.6 3.4*  CL 101 103 107 102 97*  CO2 25 23 23 27 30   GLUCOSE 147* 97 199* 157* 162*  BUN 19 19 26* 23* 23*  CREATININE 1.05* 1.09* 1.33* 1.04* 1.25*  CALCIUM 8.6* 8.1* 8.3* 9.5 9.1    Liver Function Tests:  Recent Labs Lab 12/24/16 2354 12/25/16 0520 12/30/16 2223  AST 155* 119* 49*  ALT 71* 61* 36  ALKPHOS 486* 372* 603*  BILITOT 0.9 0.7 0.4  PROT 5.6* 5.6* 6.8  ALBUMIN 2.3* 2.2* 2.9*    Recent Labs Lab 12/25/16 0520 12/30/16 2223  LIPASE 12 26   No results for input(s): AMMONIA in the last 168 hours. Coagulation Profile: No results for input(s): INR, PROTIME in the last 168 hours. Cardiac Enzymes:  Recent Labs Lab 12/31/16 1106  TROPONINI 0.05*   BNP (last 3 results) No results for input(s): PROBNP in the last 8760 hours. CBG:  Recent Labs Lab 12/26/16 0716 12/26/16 1133 12/31/16 0134 12/31/16 0813 12/31/16 1207  GLUCAP 154* 82 138* 345* 141*   Studies: Dg Chest 2 View  Result Date: 12/30/2016 CLINICAL DATA:  Shortness of breath. Bilateral lower extremity swelling. EXAM: CHEST  2 VIEW  COMPARISON:  Most recent radiographs 12/25/2016. Most recent CT 10/27/2016 FINDINGS: Tip of the right chest port in the mid SVC. The heart is normal in size. Mediastinal contours are unchanged. No pulmonary edema. Possible trace pleural effusions. Calcified granuloma in the left lower lobe. No confluent consolidation. No pneumothorax. No acute osseous abnormalities. Biliary stent in the upper abdomen is partially included. IMPRESSION: Suspect small pleural effusions. No pulmonary edema. No other acute abnormality. Electronically Signed   By: Jeb Levering M.D.   On: 12/30/2016 22:28   Ct Angio Chest Pe W And/or Wo Contrast  Result Date: 12/31/2016 CLINICAL DATA:  71 year old female with shortness of breath and tachycardia. History of pancreatic cancer. EXAM: CT ANGIOGRAPHY CHEST WITH CONTRAST TECHNIQUE: Multidetector CT imaging of the chest was performed using the standard protocol during bolus administration of intravenous contrast. Multiplanar CT image reconstructions and MIPs were obtained to evaluate  the vascular anatomy. CONTRAST:  100 cc Isovue 370 COMPARISON:  Chest radiograph dated 12/30/2016 and chest CT dated 10/27/2016 FINDINGS: Cardiovascular: There is mild cardiomegaly. There is a small pericardial effusion measuring approximately 7 mm in greatest axial thickness. The thoracic aorta is unremarkable. The origins of the great vessels of the aortic arch appear patent. There is no CT evidence of pulmonary embolism. Mediastinum/Nodes: Left hilar calcified granuloma. Right hilar adenopathy measuring up to 15 mm in short axis. There is mild thickened appearance of the distal esophagus which may be related to chronic irritation from reflux and esophagitis. Clinical correlation is recommended. There is a 4 mm right thyroid calcific focus. There is a right-sided Port-A-Cath with tip in the right atrium. Lungs/Pleura: There are small bilateral pleural effusions. There is diffuse vascular and interstitial  prominence, compatible with a degree of congestion and edema. Bilateral upper lobe predominant ground-glass and nodular densities noted suspicious for developing pneumonia. Clinical correlation and follow-up to resolution recommended. There is an 8 mm left lower lobe subpleural calcified granuloma. There is no pneumothorax. The central airways are patent. Upper Abdomen: There is pneumobilia, likely related to sphincterotomy. Small splenic calcified granuloma. Musculoskeletal: Degenerative changes of the spine. There is compression deformity of the superior endplate of the I50, age indeterminate, new compared to the study of 10/27/2016. A pathologic fracture is not entirely excluded. Review of the MIP images confirms the above findings. IMPRESSION: 1. No CT evidence of pulmonary embolism. 2. Cardiomegaly with evidence of CHF and mild interstitial edema. Bilateral upper lobe ground-glass and nodular densities most consistent with superimposed pneumonia. Correlation with clinical exam and follow-up resolution recommended. Small bilateral pleural effusions noted. 3. Right hilar adenopathy, likely reactive. 4. Thickened appearance of the distal esophagus, likely related to chronic irritation and esophagitis. 5. Mild compression deformity of the superior endplate of the T88, new compared to prior study of 10/27/2016. A pathologic fracture is not entirely excluded. Electronically Signed   By: Anner Crete M.D.   On: 12/31/2016 00:45    Scheduled Meds: . amLODipine  10 mg Oral Daily  . aspirin EC  81 mg Oral Daily  . carvedilol  25 mg Oral BID WC  . DULoxetine  60 mg Oral BID  . furosemide  40 mg Intravenous BID  . heparin  5,000 Units Subcutaneous Q8H  . insulin aspart  0-9 Units Subcutaneous TID WC  . insulin glargine  15 Units Subcutaneous Daily  . [START ON 01/01/2017] levofloxacin  750 mg Oral Q48H  . oxyCODONE-acetaminophen  1-2 tablet Oral BID  . pantoprazole  40 mg Oral QAC breakfast  . sodium  chloride flush  3 mL Intravenous Q12H   Continuous Infusions: . sodium chloride     PRN Meds: sodium chloride, acetaminophen, hydrALAZINE, lidocaine-prilocaine, metoprolol tartrate, ondansetron, sodium chloride flush, sodium chloride flush, zolpidem  Time spent: 35 minutes  Author: Berle Mull, MD [facility] Pager: [phone] 12/31/2016 5:23 PM  If 7PM-7AM, please contact night-coverage at [email or web address], password [facility]

## 2016-12-31 NOTE — ED Notes (Signed)
Patient transported to CT 

## 2017-01-01 LAB — BASIC METABOLIC PANEL
Anion gap: 9 (ref 5–15)
BUN: 29 mg/dL — AB (ref 6–20)
CHLORIDE: 99 mmol/L — AB (ref 101–111)
CO2: 28 mmol/L (ref 22–32)
CREATININE: 1.61 mg/dL — AB (ref 0.44–1.00)
Calcium: 8.5 mg/dL — ABNORMAL LOW (ref 8.9–10.3)
GFR calc Af Amer: 36 mL/min — ABNORMAL LOW (ref >60)
GFR calc non Af Amer: 31 mL/min — ABNORMAL LOW (ref >60)
GLUCOSE: 236 mg/dL — AB (ref 65–99)
POTASSIUM: 3.6 mmol/L (ref 3.5–5.1)
SODIUM: 136 mmol/L (ref 135–145)

## 2017-01-01 LAB — GLUCOSE, CAPILLARY
GLUCOSE-CAPILLARY: 246 mg/dL — AB (ref 65–99)
Glucose-Capillary: 165 mg/dL — ABNORMAL HIGH (ref 65–99)

## 2017-01-01 LAB — MAGNESIUM: Magnesium: 1.3 mg/dL — ABNORMAL LOW (ref 1.7–2.4)

## 2017-01-01 MED ORDER — POTASSIUM CHLORIDE CRYS ER 20 MEQ PO TBCR
40.0000 meq | EXTENDED_RELEASE_TABLET | Freq: Every day | ORAL | Status: DC
  Administered 2017-01-01: 40 meq via ORAL

## 2017-01-01 MED ORDER — FUROSEMIDE 20 MG PO TABS: 20.0000 mg | ORAL_TABLET | Freq: Every day | ORAL | 1 refills | Status: DC

## 2017-01-01 MED ORDER — INSULIN GLARGINE 300 UNIT/ML ~~LOC~~ SOPN
15.0000 [IU] | PEN_INJECTOR | Freq: Every day | SUBCUTANEOUS | Status: DC
Start: 2017-01-01 — End: 2017-04-09

## 2017-01-01 MED ORDER — FUROSEMIDE 10 MG/ML IJ SOLN: 40.0000 mg | Freq: Every day | INTRAMUSCULAR | Status: DC

## 2017-01-01 MED ORDER — DULOXETINE HCL 60 MG PO CPEP: 60.0000 mg | ORAL_CAPSULE | Freq: Every day | ORAL | Status: AC

## 2017-01-01 MED ORDER — HEPARIN SOD (PORK) LOCK FLUSH 100 UNIT/ML IV SOLN
500.0000 [IU] | INTRAVENOUS | Status: AC | PRN
  Administered 2017-01-01: 500 [IU]

## 2017-01-01 MED ORDER — POTASSIUM CHLORIDE CRYS ER 20 MEQ PO TBCR: 40.0000 meq | EXTENDED_RELEASE_TABLET | Freq: Every day | ORAL | 1 refills | Status: DC

## 2017-01-01 NOTE — Discharge Summary (Signed)
Physician Discharge Summary  Patricia Austin PYK:[id number] DOB: 21-Sep-1945 DOA: 12/30/2016  PCP: Chevis Pretty, FNP  Admit date: 12/30/2016 Discharge date: 01/01/2017  Time spent: 35 minutes  Recommendations for Outpatient Follow-up:  1. PCP in 1 week 2. Oncology Dr.Sherril on Monday for Chemo-please check Bmet and FU Potassium at follow up, started on lasix and KCL at discharge   Discharge Diagnoses:  Principal Problem:   Acute on chronic respiratory failure with hypoxia (HCC)   Pulmonary edema   Acute on chronic diastolic CHF   Hypertension   GERD (gastroesophageal reflux disease)   CKD (chronic kidney disease) stage 3, GFR 30-59 ml/min   Pancreatic cancer (Patricia Austin)   DM (diabetes mellitus), type 2, uncontrolled ([place])   Acute respiratory failure with hypoxia [facility])   Discharge Condition: stable  Diet recommendation: heart healthy diabetic  Filed Weights   12/30/16 2017 12/31/16 0516 01/01/17 0450  Weight: 85.3 kg (188 lb) 85 kg (187 lb 6.3 oz) 82.8 kg (182 lb 8.7 oz)    History of present illness:  Pt. with PMH of hypertension, type II DM, GERD, fibromyalgia, CKD 3, pancreatic cancer on chemotherapy; admitted on 12/30/2016, presented with complaint of shortness of breath, was found to have acute on chronic diastolic CHF.  Hospital Course:   Acute on chronic diastolic dysfunction/Accelerated hypertension. -with mildly elevated troponin likely from demand ischemia -CT angiogram of chest is negative for PE, but showed interstitial edema, indicating possible CHF.  -recent admission with FUO and was treated with aggressive IVF, suspect this contributed -improved with diuresis, back to baseline, EHCO showed normal EF and grade 1 DD -continued on coreg -discharged home on lasix 40mg  daily with KCL for 3days then 20mg  every other day, needs Bmet in 1 week to FU creatinine and potassium  Accelerated Hypertension: Blood pressure elevated on admission due to volume  overload -improved with diuresis, Continued home dose of Coreg -lasix at discharge  GERD: -Protonix  CKD (chronic kidney disease) stage 3, GFR 30-59 ml/min: stable. Cre 1.04 -mild bump in creatinine with diuresis, cut down lasix, needs Bmet in 1 week  DM-II:Last A1c 9.8 on 11/14/16,  - Patient is takingglargine insulin and Humalogat home -will decrease Lantus dose from 20 to15 units daily due to mild AKI -SSI  Pancreatic cancer [facility]): On chemotherapy, last dose was on Monday. Patient is followed up by Dr. Benay Spice. -Follow-up with Dr. Benay Spice on Monday  Procedures:  ECHO: Normal EF and grade 1 DD  Consultations:  Onc Dr.Sherril  Discharge Exam: Vitals:   12/31/16 2121 01/01/17 0450  BP: (!) 132/59 136/71  Pulse: 86 90  Resp: 18 18  Temp: 97.8 F (36.6 C) 98.5 F (36.9 C)    General: AAOx3 Cardiovascular: S1S2/RRR Respiratory: CTAB  Discharge Instructions   Discharge Instructions    Diet - low sodium heart healthy    Complete by:  As directed    Diet Carb Modified    Complete by:  As directed    Increase activity slowly    Complete by:  As directed      Current Discharge Medication List    START taking these medications   Details  furosemide (LASIX) 20 MG tablet Take 1-2 tablets (20-40 mg total) by mouth daily. Take 40mg  daily for 3days then take 20mg  every other day Qty: 30 tablet, Refills: 1    potassium chloride SA (K-DUR,KLOR-CON) 20 MEQ tablet Take 2 tablets (40 mEq total) by mouth daily. Take daily for 3days then every other day with lasix  pill Qty: 30 tablet, Refills: 1      CONTINUE these medications which have CHANGED   Details  DULoxetine (CYMBALTA) 60 MG capsule Take 1 capsule (60 mg total) by mouth daily.   Associated Diagnoses: Type 2 diabetes mellitus with diabetic polyneuropathy, with long-term current use of insulin (HCC)    Insulin Glargine (TOUJEO SOLOSTAR) 300 UNIT/ML SOPN Inject 15 Units into the skin daily.    Associated Diagnoses: Uncontrolled type 2 diabetes mellitus with other specified complication, with long-term current use of insulin ([place])      CONTINUE these medications which have NOT CHANGED   Details  acetaminophen (TYLENOL) 500 MG tablet Take 500-1,000 mg by mouth every 6 (six) hours as needed for moderate pain.    amLODipine (NORVASC) 5 MG tablet Take 1 tablet (5 mg total) by mouth daily. Qty: 90 tablet, Refills: 3   Associated Diagnoses: Essential hypertension    carvedilol (COREG) 25 MG tablet TAKE  (1)  TABLET TWICE A DAY WITH MEALS (BREAKFAST AND SUPPER) Qty: 180 tablet, Refills: 1   Associated Diagnoses: Essential hypertension    insulin lispro (HUMALOG) 100 UNIT/ML injection Up to 15u with each meal Qty: 10 mL, Refills: prn   Associated Diagnoses: Uncontrolled type 2 diabetes mellitus with other specified complication, with long-term current use of insulin (HCC)    oxyCODONE-acetaminophen (PERCOCET/ROXICET) 5-325 MG tablet Take 1-2 tablets by mouth 2 (two) times daily. Qty: 60 tablet, Refills: 0   Associated Diagnoses: Acute right-sided low back pain without sciatica    pantoprazole (PROTONIX) 40 MG tablet Take 1 tablet (40 mg total) by mouth daily before breakfast. Qty: 30 tablet, Refills: 5    glucose blood (ONE TOUCH ULTRA TEST) test strip Test up to qid. DX:E11.65 Qty: 100 each, Refills: 3   Associated Diagnoses: Type 2 diabetes mellitus with diabetic polyneuropathy (HCC)    lidocaine-prilocaine (EMLA) cream Apply to port site one hour prior to use. Do not rub in. Cover with plastic. Qty: 30 g, Refills: 0    ondansetron (ZOFRAN) 8 MG tablet Take 1 tablet (8 mg total) by mouth every 8 (eight) hours as needed for nausea or vomiting. Qty: 20 tablet, Refills: 1      STOP taking these medications     levofloxacin (LEVAQUIN) 750 MG tablet        No Known Allergies Follow-up Information    Chevis Pretty, FNP. Schedule an appointment as soon as possible  for a visit in 1 week(s).   Specialty:  Family Medicine Contact information: [place] [address] [phone]        Ladell Pier, MD Follow up on 01/05/2017.   Specialty:  Oncology Why:  with labs to follow up Bmet and potassium level on Monday Contact information: [place] [address] [phone]            The results of significant diagnostics from this hospitalization (including imaging, microbiology, ancillary and laboratory) are listed below for reference.    Significant Diagnostic Studies: Dg Chest 2 View  Result Date: 12/30/2016 CLINICAL DATA:  Shortness of breath. Bilateral lower extremity swelling. EXAM: CHEST  2 VIEW COMPARISON:  Most recent radiographs 12/25/2016. Most recent CT 10/27/2016 FINDINGS: Tip of the right chest port in the mid SVC. The heart is normal in size. Mediastinal contours are unchanged. No pulmonary edema. Possible trace pleural effusions. Calcified granuloma in the left lower lobe. No confluent consolidation. No pneumothorax. No acute osseous abnormalities. Biliary stent in the  upper abdomen is partially included. IMPRESSION: Suspect small pleural effusions. No pulmonary edema. No other acute abnormality. Electronically Signed   By: Jeb Levering M.D.   On: 12/30/2016 22:28   Dg Chest 2 View  Result Date: 12/25/2016 CLINICAL DATA:  Fever.  Active chemotherapy for pancreatic cancer. EXAM: CHEST  2 VIEW COMPARISON:  Radiograph 09/30/2016, CT 10/27/2016 FINDINGS: Tip of the right chest port in the mid SVC. Stable cardiomegaly and mediastinal contours. No focal airspace disease to suggest pneumonia. Mild bronchial thickening. Calcified granuloma in the left lower lobe. No pleural fluid or pneumothorax. IMPRESSION: 1. Mild bronchial thickening. No consolidation to suggest pneumonia. 2. Stable cardiomegaly. 3. Right chest port with tip in the mid SVC. Electronically Signed   By: Jeb Levering M.D.    On: 12/25/2016 01:23   Dg Lumbar Spine Complete  Result Date: 12/23/2016 CLINICAL DATA:  Lumbosacral back pain. Felt a pop while sitting on a chair 2 weeks ago with persistent pain. EXAM: LUMBAR SPINE - COMPLETE 4+ VIEW COMPARISON:  Radiograph 09/09/2016 FINDINGS: Posterior L3 through L5 fusion with rod and intrapedicular screws. There are interbody spacers. The hardware appears intact. Disc space narrowing and endplate spurring at Q0-G8 and L2-L3. Vertebral body heights are maintained. No acute fracture. No evidence of focal hepatic lesion. There is a biliary drain in place. IMPRESSION: 1. L3 through L5 fusion without evidence of hardware complication. 2. Degenerative disc disease at L1-L2 and L2-L3. No acute abnormality is seen radiographically. Electronically Signed   By: Jeb Levering M.D.   On: 12/23/2016 02:17   Ct Angio Chest Pe W And/or Wo Contrast  Result Date: 12/31/2016 CLINICAL DATA:  71 year old female with shortness of breath and tachycardia. History of pancreatic cancer. EXAM: CT ANGIOGRAPHY CHEST WITH CONTRAST TECHNIQUE: Multidetector CT imaging of the chest was performed using the standard protocol during bolus administration of intravenous contrast. Multiplanar CT image reconstructions and MIPs were obtained to evaluate the vascular anatomy. CONTRAST:  100 cc Isovue 370 COMPARISON:  Chest radiograph dated 12/30/2016 and chest CT dated 10/27/2016 FINDINGS: Cardiovascular: There is mild cardiomegaly. There is a small pericardial effusion measuring approximately 7 mm in greatest axial thickness. The thoracic aorta is unremarkable. The origins of the great vessels of the aortic arch appear patent. There is no CT evidence of pulmonary embolism. Mediastinum/Nodes: Left hilar calcified granuloma. Right hilar adenopathy measuring up to 15 mm in short axis. There is mild thickened appearance of the distal esophagus which may be related to chronic irritation from reflux and esophagitis. Clinical  correlation is recommended. There is a 4 mm right thyroid calcific focus. There is a right-sided Port-A-Cath with tip in the right atrium. Lungs/Pleura: There are small bilateral pleural effusions. There is diffuse vascular and interstitial prominence, compatible with a degree of congestion and edema. Bilateral upper lobe predominant ground-glass and nodular densities noted suspicious for developing pneumonia. Clinical correlation and follow-up to resolution recommended. There is an 8 mm left lower lobe subpleural calcified granuloma. There is no pneumothorax. The central airways are patent. Upper Abdomen: There is pneumobilia, likely related to sphincterotomy. Small splenic calcified granuloma. Musculoskeletal: Degenerative changes of the spine. There is compression deformity of the superior endplate of the Q76, age indeterminate, new compared to the study of 10/27/2016. A pathologic fracture is not entirely excluded. Review of the MIP images confirms the above findings. IMPRESSION: 1. No CT evidence of pulmonary embolism. 2. Cardiomegaly with evidence of CHF and mild interstitial edema. Bilateral upper lobe ground-glass and nodular densities  most consistent with superimposed pneumonia. Correlation with clinical exam and follow-up resolution recommended. Small bilateral pleural effusions noted. 3. Right hilar adenopathy, likely reactive. 4. Thickened appearance of the distal esophagus, likely related to chronic irritation and esophagitis. 5. Mild compression deformity of the superior endplate of the K93, new compared to prior study of 10/27/2016. A pathologic fracture is not entirely excluded. Electronically Signed   By: Anner Crete M.D.   On: 12/31/2016 00:45   Dg Chest Port 1 View  Result Date: 12/25/2016 CLINICAL DATA:  Fever.  History of pancreatic carcinoma EXAM: PORTABLE CHEST 1 VIEW COMPARISON:  Study obtained earlier in the day FINDINGS: Port-A-Cath present with tip in the superior vena cava. No  pneumothorax. There is a calcified granuloma in the left base. There is slight right base atelectasis. No edema or consolidation. Heart is upper normal in size with pulmonary vascularity within normal limits. No adenopathy. No bone lesions. IMPRESSION: Port-A-Cath present. No pneumothorax. Calcified granuloma left base. Mild atelectasis right base. No edema or consolidation. Electronically Signed   By: Lowella Grip III M.D.   On: 12/25/2016 08:33    Microbiology: Recent Results (from the past 240 hour(s))  Blood Culture (routine x 2)     Status: None   Collection Time: 12/24/16 11:57 PM  Result Value Ref Range Status   Specimen Description BLOOD LEFT ANTECUBITAL  Final   Special Requests   Final    BOTTLES DRAWN AEROBIC AND ANAEROBIC Blood Culture adequate volume   Culture   Final    NO GROWTH 5 DAYS Performed at [facility], [address] [place], [place] [address]    Report Status 12/30/2016 FINAL  Final  Blood Culture (routine x 2)     Status: None   Collection Time: 12/24/16 11:57 PM  Result Value Ref Range Status   Specimen Description BLOOD RIGHT PORT  Final   Special Requests   Final    BOTTLES DRAWN AEROBIC AND ANAEROBIC Blood Culture adequate volume   Culture   Final    NO GROWTH 5 DAYS Performed at [facility], [address], [place], [place] [address]    Report Status 12/30/2016 FINAL  Final  Urine Culture     Status: Abnormal   Collection Time: 12/25/16  1:48 AM  Result Value Ref Range Status   Specimen Description URINE, RANDOM  Final   Special Requests NONE  Final   Culture MULTIPLE SPECIES PRESENT, SUGGEST RECOLLECTION (A)  Final   Report Status 12/26/2016 FINAL  Final     Labs: Basic Metabolic Panel:  Recent Labs Lab 12/26/16 0500 12/30/16 2223 12/31/16 1106 01/01/17 0323  NA 136 138 137 136  K 3.8 3.6 3.4* 3.6  CL 107 102 97* 99*  CO2 23 27 30 28   GLUCOSE 199* 157* 162* 236*  BUN 26* 23* 23* 29*  CREATININE 1.33* 1.04* 1.25*  1.61*  CALCIUM 8.3* 9.5 9.1 8.5*  MG  --   --   --  1.3*   Liver Function Tests:  Recent Labs Lab 12/30/16 2223  AST 49*  ALT 36  ALKPHOS 603*  BILITOT 0.4  PROT 6.8  ALBUMIN 2.9*    Recent Labs Lab 12/30/16 2223  LIPASE 26   No results for input(s): AMMONIA in the last 168 hours. CBC:  Recent Labs Lab 12/26/16 0500 12/30/16 2223  WBC 6.7 8.5  NEUTROABS 5.7 6.3  HGB 7.4* 8.5*  HCT 22.3* 25.6*  MCV 84.8 83.7  PLT 293  260   Cardiac Enzymes:  Recent Labs Lab 12/31/16 1106 12/31/16 1751  TROPONINI 0.05* 0.08*   BNP: BNP (last 3 results)  Recent Labs  12/30/16 2224  BNP 378.8*    ProBNP (last 3 results) No results for input(s): PROBNP in the last 8760 hours.  CBG:  Recent Labs Lab 12/31/16 0134 12/31/16 0813 12/31/16 1207 12/31/16 2119 01/01/17 0804  GLUCAP 138* 345* 141* 192* 165*       SignedDomenic Polite MD.  [facility] 01/01/2017, 11:51 AM

## 2017-01-01 NOTE — Consult Note (Signed)
   [facility] CM Inpatient Consult   01/01/2017  Patricia Austin 08-Mar-1946 [id number]    Patient screened for [place] [facility] program due to multiple readmissions. However, Patricia Austin was discharged prior to bedside visit.    Marthenia Rolling, MSN-Ed, RN,BSN [facility] Liaison [phone]

## 2017-01-01 NOTE — Care Management Note (Signed)
Case Management Note  Patient Details  Name: Patricia Austin MRN: [id number] Date of Birth: 1946-04-03  Subjective/Objective:70 y/o f admitted w/Acute on chronic resp failure. From home.                    Action/Plan:d/c home.   Expected Discharge Date:  01/01/17               Expected Discharge Plan:  Home/Self Care  In-House Referral:     Discharge planning Services  CM Consult  Post Acute Care Choice:    Choice offered to:     DME Arranged:    DME Agency:     HH Arranged:    HH Agency:     Status of Service:     If discussed at [employer], dates discussed:    Additional Comments:  Dessa Phi, RN 01/01/2017, 12:11 PM

## 2017-01-02 ENCOUNTER — Other Ambulatory Visit: Payer: Self-pay | Admitting: Oncology

## 2017-01-05 ENCOUNTER — Ambulatory Visit (HOSPITAL_BASED_OUTPATIENT_CLINIC_OR_DEPARTMENT_OTHER): Payer: Medicare Other | Admitting: *Deleted

## 2017-01-05 ENCOUNTER — Ambulatory Visit (HOSPITAL_BASED_OUTPATIENT_CLINIC_OR_DEPARTMENT_OTHER): Payer: Medicare Other

## 2017-01-05 ENCOUNTER — Ambulatory Visit (HOSPITAL_BASED_OUTPATIENT_CLINIC_OR_DEPARTMENT_OTHER): Payer: Medicare Other | Admitting: Nurse Practitioner

## 2017-01-05 ENCOUNTER — Ambulatory Visit: Payer: Medicare Other

## 2017-01-05 VITALS — BP 166/70 | HR 106 | Temp 98.6°F | Resp 18 | Ht 63.0 in | Wt 170.7 lb

## 2017-01-05 DIAGNOSIS — C259 Malignant neoplasm of pancreas, unspecified: Secondary | ICD-10-CM

## 2017-01-05 DIAGNOSIS — Z95828 Presence of other vascular implants and grafts: Secondary | ICD-10-CM

## 2017-01-05 DIAGNOSIS — C258 Malignant neoplasm of overlapping sites of pancreas: Secondary | ICD-10-CM

## 2017-01-05 LAB — COMPREHENSIVE METABOLIC PANEL
ALT: 32 U/L (ref 0–55)
ANION GAP: 11 meq/L (ref 3–11)
AST: 42 U/L — ABNORMAL HIGH (ref 5–34)
Albumin: 2.5 g/dL — ABNORMAL LOW (ref 3.5–5.0)
Alkaline Phosphatase: 691 U/L — ABNORMAL HIGH (ref 40–150)
BUN: 22.3 mg/dL (ref 7.0–26.0)
CALCIUM: 10 mg/dL (ref 8.4–10.4)
CHLORIDE: 99 meq/L (ref 98–109)
CO2: 27 mEq/L (ref 22–29)
CREATININE: 1.5 mg/dL — AB (ref 0.6–1.1)
EGFR: 35 mL/min/{1.73_m2} — ABNORMAL LOW (ref >90)
Glucose: 368 mg/dl — ABNORMAL HIGH (ref 70–140)
POTASSIUM: 4 meq/L (ref 3.5–5.1)
Sodium: 136 mEq/L (ref 136–145)
Total Bilirubin: 0.4 mg/dL (ref 0.20–1.20)
Total Protein: 6.6 g/dL (ref 6.4–8.3)

## 2017-01-05 LAB — CBC WITH DIFFERENTIAL/PLATELET
BASO%: 0.6 % (ref 0.0–2.0)
BASOS ABS: 0.1 10*3/uL (ref 0.0–0.1)
EOS%: 1.3 % (ref 0.0–7.0)
Eosinophils Absolute: 0.1 10*3/uL (ref 0.0–0.5)
HEMATOCRIT: 31 % — AB (ref 34.8–46.6)
HGB: 9.8 g/dL — ABNORMAL LOW (ref 11.6–15.9)
LYMPH%: 15.3 % (ref 14.0–49.7)
MCH: 26.9 pg (ref 25.1–34.0)
MCHC: 31.6 g/dL (ref 31.5–36.0)
MCV: 85.2 fL (ref 79.5–101.0)
MONO#: 0.9 10*3/uL (ref 0.1–0.9)
MONO%: 10.1 % (ref 0.0–14.0)
NEUT#: 6.7 10*3/uL — ABNORMAL HIGH (ref 1.5–6.5)
NEUT%: 72.7 % (ref 38.4–76.8)
PLATELETS: 449 10*3/uL — AB (ref 145–400)
RBC: 3.64 10*6/uL — AB (ref 3.70–5.45)
RDW: 16.3 % — ABNORMAL HIGH (ref 11.2–14.5)
WBC: 9.2 10*3/uL (ref 3.9–10.3)
lymph#: 1.4 10*3/uL (ref 0.9–3.3)

## 2017-01-05 MED ORDER — HEPARIN SOD (PORK) LOCK FLUSH 100 UNIT/ML IV SOLN
500.0000 [IU] | Freq: Once | INTRAVENOUS | Status: AC
  Administered 2017-01-05: 500 [IU]
  Filled 2017-01-05: qty 5

## 2017-01-05 MED ORDER — SODIUM CHLORIDE 0.9% FLUSH
10.0000 mL | Freq: Once | INTRAVENOUS | Status: AC
  Administered 2017-01-05: 10 mL
  Filled 2017-01-05: qty 10

## 2017-01-05 NOTE — Progress Notes (Signed)
  [place] OFFICE PROGRESS NOTE   Diagnosis:  Pancreas cancer  INTERVAL HISTORY:   Patricia Austin returns as scheduled. She completed cycle 3 gemcitabine/Abraxane 12/22/2016. She was hospitalized 12/24/2016 through 12/26/2016 with a fever. She was treated with antibiotics. No source for infection was identified. She was readmitted 12/30/2016 with exertional dyspnea. Chest CT showed evidence of pulmonary edema and possible pneumonia. She improved with diuresis.  She continues to feel weak, especially in her legs. Activity level is poor. She is not eating well. She is having intermittent nausea.  Objective:  Vital signs in last 24 hours:  Blood pressure (!) 166/70, pulse (!) 106, temperature 98.6 F (37 C), temperature source Oral, resp. rate 18, height 5\' 3"  (1.6 m), weight 170 lb 11.2 oz (77.4 kg), SpO2 95 %.    HEENT: No thrush or ulcers. Resp: Lungs clear bilaterally. Cardio: Regular rate and rhythm. GI: Abdomen soft and nontender. No hepatomegaly. Vascular: No leg edema. Neuro: Alert and oriented. Lower extremity motor strength 5 over 5. Skin: No rash. Port-A-Cath without erythema.    Lab Results:  Lab Results  Component Value Date   WBC 9.2 01/05/2017   HGB 9.8 (L) 01/05/2017   HCT 31.0 (L) 01/05/2017   MCV 85.2 01/05/2017   PLT 449 (H) 01/05/2017   NEUTROABS 6.7 (H) 01/05/2017    Imaging:  No results found.  Medications: I have reviewed the patient's current medications.  Assessment/Plan: 1. Adenocarcinoma the pancreas-pancreas head/uncinatemass noted on a CT abdomen/pelvis 09/20/2016, status post an EUS biopsy 10/09/2016 confirming adenocarcinoma  CT 09/20/2016-pancreas head/uncinatemass, possible contact with the superior mesenteric artery, biliary dilatation, small low lung nodules  CTs chest, abdomen, and pelvis 10/27/2016-enlarging pancreas head mass with vascular abutment, no evidence of metastatic disease  Cycle 1 gemcitabine/Abraxane  11/25/2016  Cycle 2 gemcitabine/Abraxane 12/08/2016  Cycle 3 gemcitabine/Abraxane 12/22/2016  2. Biliary obstruction secondary to #1, status post placement of a wall stent and external biliary drain 10/09/2016  Biliary drain removed 10/20/2016  3. Diabetes-admitted with marked hyperglycemia 10/27/2016  4. History of squamous cell carcinoma of the scalp  5. Lumbar fusion surgery February 2018  6. 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 History of Abdominal/back pain secondary to pancreas cancer  8. Cholangitis 10/07/2016  9. Confusionduring hospital admission 10/27/2016-etiology unclear, MRI brain negative for metastatic disease on 10/29/2016, improved  10. Port-A-Cath placement 11/19/2016  11.  Admission 12/30/2016 with dyspnea, chest CT consistent with pulmonary edema    Disposition: Patricia Austin has completed 3 cycles of gemcitabine/Abraxane. Since completing cycle 3 she has been admitted to the hospital 2 times, initially with fever and then shortness of breath. She is slowly recovering from the recent hospitalizations. Her performance status is poor. We decided to hold today's chemotherapy and reschedule for one week. We will see her in follow-up on 01/19/2017. She will work on increasing oral intake and activity.    Ned Card ANP/GNP-BC   01/05/2017  12:12 PM

## 2017-01-06 LAB — CANCER ANTIGEN 19-9: CA 19-9: 6297 U/mL — ABNORMAL HIGH (ref 0–35)

## 2017-01-12 DIAGNOSIS — C250 Malignant neoplasm of head of pancreas: Secondary | ICD-10-CM | POA: Diagnosis not present

## 2017-01-12 DIAGNOSIS — S22080A Wedge compression fracture of T11-T12 vertebra, initial encounter for closed fracture: Secondary | ICD-10-CM | POA: Diagnosis not present

## 2017-01-13 ENCOUNTER — Telehealth: Payer: Self-pay | Admitting: Nurse Practitioner

## 2017-01-13 ENCOUNTER — Ambulatory Visit: Payer: Medicare Other

## 2017-01-13 ENCOUNTER — Ambulatory Visit (HOSPITAL_BASED_OUTPATIENT_CLINIC_OR_DEPARTMENT_OTHER): Payer: Medicare Other | Admitting: Nurse Practitioner

## 2017-01-13 ENCOUNTER — Other Ambulatory Visit (HOSPITAL_BASED_OUTPATIENT_CLINIC_OR_DEPARTMENT_OTHER): Payer: Medicare Other

## 2017-01-13 ENCOUNTER — Ambulatory Visit (HOSPITAL_BASED_OUTPATIENT_CLINIC_OR_DEPARTMENT_OTHER): Payer: Medicare Other

## 2017-01-13 VITALS — BP 120/84 | HR 90 | Temp 97.7°F | Resp 18 | Ht 63.0 in | Wt 170.7 lb

## 2017-01-13 DIAGNOSIS — C258 Malignant neoplasm of overlapping sites of pancreas: Secondary | ICD-10-CM

## 2017-01-13 DIAGNOSIS — Z5111 Encounter for antineoplastic chemotherapy: Secondary | ICD-10-CM

## 2017-01-13 DIAGNOSIS — C259 Malignant neoplasm of pancreas, unspecified: Secondary | ICD-10-CM

## 2017-01-13 DIAGNOSIS — Z95828 Presence of other vascular implants and grafts: Secondary | ICD-10-CM

## 2017-01-13 DIAGNOSIS — C250 Malignant neoplasm of head of pancreas: Secondary | ICD-10-CM

## 2017-01-13 LAB — COMPREHENSIVE METABOLIC PANEL
ALT: 24 U/L (ref 0–55)
AST: 31 U/L (ref 5–34)
Albumin: 2.7 g/dL — ABNORMAL LOW (ref 3.5–5.0)
Alkaline Phosphatase: 549 U/L — ABNORMAL HIGH (ref 40–150)
Anion Gap: 8 mEq/L (ref 3–11)
BUN: 30.8 mg/dL — AB (ref 7.0–26.0)
CALCIUM: 9.8 mg/dL (ref 8.4–10.4)
CHLORIDE: 98 meq/L (ref 98–109)
CO2: 27 mEq/L (ref 22–29)
Creatinine: 1.5 mg/dL — ABNORMAL HIGH (ref 0.6–1.1)
EGFR: 33 mL/min/{1.73_m2} — ABNORMAL LOW (ref >90)
Glucose: 223 mg/dl — ABNORMAL HIGH (ref 70–140)
Sodium: 133 mEq/L — ABNORMAL LOW (ref 136–145)
Total Bilirubin: 0.34 mg/dL (ref 0.20–1.20)
Total Protein: 6.7 g/dL (ref 6.4–8.3)

## 2017-01-13 LAB — CBC WITH DIFFERENTIAL/PLATELET
BASO%: 0.9 % (ref 0.0–2.0)
BASOS ABS: 0.1 10*3/uL (ref 0.0–0.1)
EOS%: 1.7 % (ref 0.0–7.0)
Eosinophils Absolute: 0.2 10*3/uL (ref 0.0–0.5)
HEMATOCRIT: 30.1 % — AB (ref 34.8–46.6)
HGB: 9.4 g/dL — ABNORMAL LOW (ref 11.6–15.9)
LYMPH#: 1.9 10*3/uL (ref 0.9–3.3)
LYMPH%: 13.7 % — AB (ref 14.0–49.7)
MCH: 26.7 pg (ref 25.1–34.0)
MCHC: 31.2 g/dL — AB (ref 31.5–36.0)
MCV: 85.5 fL (ref 79.5–101.0)
MONO#: 1.2 10*3/uL — ABNORMAL HIGH (ref 0.1–0.9)
MONO%: 9.1 % (ref 0.0–14.0)
NEUT#: 10.2 10*3/uL — ABNORMAL HIGH (ref 1.5–6.5)
NEUT%: 74.6 % (ref 38.4–76.8)
Platelets: 543 10*3/uL — ABNORMAL HIGH (ref 145–400)
RBC: 3.52 10*6/uL — ABNORMAL LOW (ref 3.70–5.45)
RDW: 16.6 % — ABNORMAL HIGH (ref 11.2–14.5)
WBC: 13.7 10*3/uL — ABNORMAL HIGH (ref 3.9–10.3)

## 2017-01-13 MED ORDER — OXYCODONE-ACETAMINOPHEN 5-325 MG PO TABS
2.0000 | ORAL_TABLET | Freq: Once | ORAL | Status: AC
Start: 2017-01-13 — End: 2017-01-13
  Administered 2017-01-13: 2 via ORAL

## 2017-01-13 MED ORDER — GEMCITABINE HCL CHEMO INJECTION 1 GM/26.3ML
800.0000 mg/m2 | Freq: Once | INTRAVENOUS | Status: AC
  Administered 2017-01-13: 1520 mg via INTRAVENOUS
  Filled 2017-01-13: qty 39.98

## 2017-01-13 MED ORDER — SODIUM CHLORIDE 0.9% FLUSH
10.0000 mL | INTRAVENOUS | Status: DC | PRN
  Administered 2017-01-13: 10 mL
  Filled 2017-01-13: qty 10

## 2017-01-13 MED ORDER — PACLITAXEL PROTEIN-BOUND CHEMO INJECTION 100 MG
100.0000 mg/m2 | Freq: Once | INTRAVENOUS | Status: AC
  Administered 2017-01-13: 200 mg via INTRAVENOUS
  Filled 2017-01-13: qty 40

## 2017-01-13 MED ORDER — SODIUM CHLORIDE 0.9 % IV SOLN
Freq: Once | INTRAVENOUS | Status: AC
  Administered 2017-01-13: 14:00:00 via INTRAVENOUS

## 2017-01-13 MED ORDER — HEPARIN SOD (PORK) LOCK FLUSH 100 UNIT/ML IV SOLN
500.0000 [IU] | Freq: Once | INTRAVENOUS | Status: AC | PRN
  Administered 2017-01-13: 500 [IU]
  Filled 2017-01-13: qty 5

## 2017-01-13 MED ORDER — PROCHLORPERAZINE MALEATE 10 MG PO TABS
ORAL_TABLET | ORAL | Status: AC
  Filled 2017-01-13: qty 1

## 2017-01-13 MED ORDER — OXYCODONE-ACETAMINOPHEN 5-325 MG PO TABS
ORAL_TABLET | ORAL | Status: AC
  Filled 2017-01-13: qty 2

## 2017-01-13 MED ORDER — PROCHLORPERAZINE MALEATE 10 MG PO TABS
10.0000 mg | ORAL_TABLET | Freq: Once | ORAL | Status: AC
  Administered 2017-01-13: 10 mg via ORAL

## 2017-01-13 MED ORDER — SODIUM CHLORIDE 0.9% FLUSH
10.0000 mL | Freq: Once | INTRAVENOUS | Status: AC
  Administered 2017-01-13: 10 mL
  Filled 2017-01-13: qty 10

## 2017-01-13 NOTE — Patient Instructions (Signed)
[facility] Discharge Instructions for Patients Receiving Chemotherapy  Today you received the following chemotherapy agents gemzar/abraxane  To help prevent nausea and vomiting after your treatment, we encourage you to take your nausea medication as directed  If you develop nausea and vomiting that is not controlled by your nausea medication, call the clinic.   BELOW ARE SYMPTOMS THAT SHOULD BE REPORTED IMMEDIATELY:  *FEVER GREATER THAN 100.5 F  *CHILLS WITH OR WITHOUT FEVER  NAUSEA AND VOMITING THAT IS NOT CONTROLLED WITH YOUR NAUSEA MEDICATION  *UNUSUAL SHORTNESS OF BREATH  *UNUSUAL BRUISING OR BLEEDING  TENDERNESS IN MOUTH AND THROAT WITH OR WITHOUT PRESENCE OF ULCERS  *URINARY PROBLEMS  *BOWEL PROBLEMS  UNUSUAL RASH Items with * indicate a potential emergency and should be followed up as soon as possible.  Feel free to call the clinic you have any questions or concerns. The clinic phone number is [phone].  

## 2017-01-13 NOTE — Telephone Encounter (Signed)
Scheduled appt per 7/24 los - patient to get new schedule in the treatment area.

## 2017-01-13 NOTE — Progress Notes (Signed)
  Patricia Austin PROGRESS NOTE   Diagnosis: Pancreas cancer    INTERVAL HISTORY:   Patricia Austin as scheduled. She has completed 3 cycles of gemcitabine/Abraxane. Treatment was held 01/05/2017 due to a poor performance status.  She is feeling better. No nausea. Appetite has improved. She continues to feel weak but is getting out of the house. No leg swelling. No diarrhea. She reports seeing Dr. Cyndy Freeze yesterday regarding a compression fracture at T12. She reports "cement" is planned. She has had low back pain for the past 2 months as well as right leg weakness. She takes oxycodone as needed.  Objective:  Vital signs in last 24 hours:  Blood pressure 120/84, pulse 90, temperature 97.7 F (36.5 C), temperature source Oral, resp. rate 18, height 5\' 3"  (1.6 m), weight 170 lb 11.2 oz (77.4 kg), SpO2 99 %.    HEENT: No thrush or ulcers.  Resp: Lungs clear bilaterally. Cardio: Regular rate and rhythm. GI: Abdomen soft and nontender. No hepatomegaly. Vascular: Trace lower leg edema bilaterally. Neuro: Decreased strength right proximal leg.  Port-A-Cath without erythema.   Lab Results:  Lab Results  Component Value Date   WBC 13.7 (H) 01/13/2017   HGB 9.4 (L) 01/13/2017   HCT 30.1 (L) 01/13/2017   MCV 85.5 01/13/2017   PLT 543 (H) 01/13/2017   NEUTROABS 10.2 (H) 01/13/2017    Imaging:  No results found.  Medications: I have reviewed the patient's current medications.  Assessment/Plan: 1. Adenocarcinoma the pancreas-pancreas head/uncinatemass noted on a CT abdomen/pelvis 09/20/2016, status post an EUS biopsy 10/09/2016 confirming adenocarcinoma  CT 09/20/2016-pancreas head/uncinatemass, possible contact with the superior mesenteric artery, biliary dilatation, small low lung nodules  CTs chest, abdomen, and pelvis 10/27/2016-enlarging pancreas head mass with vascular abutment, no evidence of metastatic disease  Cycle 1 gemcitabine/Abraxane  11/25/2016  Cycle 2 gemcitabine/Abraxane 12/08/2016  Cycle 3 gemcitabine/Abraxane 12/22/2016  Cycle 4 gemcitabine/Abraxane 01/13/2017  2. Biliary obstruction secondary to #1, status post placement of a wall stent and external biliary drain 10/09/2016  Biliary drain removed 10/20/2016  3. Diabetes-admitted with marked hyperglycemia 10/27/2016  4. History of squamous cell carcinoma of the scalp  5. Lumbar fusion surgery February 2018  6. 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 History of Abdominal/back pain secondary to pancreas cancer  8. Cholangitis 10/07/2016  9. Confusionduring hospital admission 10/27/2016-etiology unclear, MRI brain negative for metastatic disease on 10/29/2016, improved  10. Port-A-Cath placement 11/19/2016  11. Admission 12/30/2016 with dyspnea, chest CT consistent with pulmonary edema    Disposition: Ms. Manalang appears stable. Her performance status is better. Plan to proceed with cycle 4 gemcitabine/Abraxane today as scheduled. She will return for a follow-up visit and cycle 5 in 2 weeks. The plan is for restaging CT scans after she has completed 5 or 6 cycles of gemcitabine/Abraxane. She will contact the [facility] prior to her next visit with any problems.  Plan reviewed with Dr. Benay Spice.    Ned Card ANP/GNP-BC   01/13/2017  12:33 PM

## 2017-01-13 NOTE — Progress Notes (Signed)
Ok to treat with potassium 5.2 today per Ned Card, NP, pt advised to stop taking oral potassium at home.  Pt verbalized understanding.

## 2017-01-13 NOTE — Patient Instructions (Signed)

## 2017-01-16 DIAGNOSIS — C250 Malignant neoplasm of head of pancreas: Secondary | ICD-10-CM | POA: Diagnosis not present

## 2017-01-19 ENCOUNTER — Ambulatory Visit: Payer: Self-pay | Admitting: Nurse Practitioner

## 2017-01-19 ENCOUNTER — Ambulatory Visit: Payer: Self-pay

## 2017-01-19 ENCOUNTER — Other Ambulatory Visit: Payer: Self-pay

## 2017-01-20 ENCOUNTER — Ambulatory Visit (INDEPENDENT_AMBULATORY_CARE_PROVIDER_SITE_OTHER): Payer: Medicare Other | Admitting: Pediatrics

## 2017-01-20 DIAGNOSIS — I129 Hypertensive chronic kidney disease with stage 1 through stage 4 chronic kidney disease, or unspecified chronic kidney disease: Secondary | ICD-10-CM

## 2017-01-20 DIAGNOSIS — E1165 Type 2 diabetes mellitus with hyperglycemia: Secondary | ICD-10-CM | POA: Diagnosis not present

## 2017-01-20 DIAGNOSIS — S22080A Wedge compression fracture of T11-T12 vertebra, initial encounter for closed fracture: Secondary | ICD-10-CM | POA: Diagnosis not present

## 2017-01-20 DIAGNOSIS — S22089A Unspecified fracture of T11-T12 vertebra, initial encounter for closed fracture: Secondary | ICD-10-CM | POA: Diagnosis not present

## 2017-01-21 ENCOUNTER — Telehealth: Payer: Self-pay | Admitting: Oncology

## 2017-01-21 NOTE — Telephone Encounter (Signed)
Called to confirm appt for 8/6 - patient is aware.

## 2017-01-25 ENCOUNTER — Other Ambulatory Visit: Payer: Self-pay | Admitting: Oncology

## 2017-01-26 ENCOUNTER — Other Ambulatory Visit (HOSPITAL_BASED_OUTPATIENT_CLINIC_OR_DEPARTMENT_OTHER): Payer: Medicare Other

## 2017-01-26 ENCOUNTER — Other Ambulatory Visit: Payer: Self-pay | Admitting: *Deleted

## 2017-01-26 ENCOUNTER — Ambulatory Visit (HOSPITAL_BASED_OUTPATIENT_CLINIC_OR_DEPARTMENT_OTHER): Payer: Medicare Other

## 2017-01-26 ENCOUNTER — Ambulatory Visit: Payer: Medicare Other

## 2017-01-26 ENCOUNTER — Ambulatory Visit (HOSPITAL_BASED_OUTPATIENT_CLINIC_OR_DEPARTMENT_OTHER): Payer: Medicare Other | Admitting: Oncology

## 2017-01-26 ENCOUNTER — Encounter: Payer: Self-pay | Admitting: Oncology

## 2017-01-26 VITALS — BP 141/51 | HR 86 | Temp 98.5°F | Resp 17 | Ht 63.0 in | Wt 172.4 lb

## 2017-01-26 DIAGNOSIS — R5383 Other fatigue: Secondary | ICD-10-CM | POA: Diagnosis not present

## 2017-01-26 DIAGNOSIS — M810 Age-related osteoporosis without current pathological fracture: Secondary | ICD-10-CM | POA: Diagnosis not present

## 2017-01-26 DIAGNOSIS — Z95828 Presence of other vascular implants and grafts: Secondary | ICD-10-CM

## 2017-01-26 DIAGNOSIS — C259 Malignant neoplasm of pancreas, unspecified: Secondary | ICD-10-CM

## 2017-01-26 DIAGNOSIS — C250 Malignant neoplasm of head of pancreas: Secondary | ICD-10-CM

## 2017-01-26 DIAGNOSIS — C258 Malignant neoplasm of overlapping sites of pancreas: Secondary | ICD-10-CM

## 2017-01-26 DIAGNOSIS — R531 Weakness: Secondary | ICD-10-CM | POA: Diagnosis not present

## 2017-01-26 DIAGNOSIS — Z5111 Encounter for antineoplastic chemotherapy: Secondary | ICD-10-CM | POA: Diagnosis not present

## 2017-01-26 LAB — COMPREHENSIVE METABOLIC PANEL
ALBUMIN: 2.6 g/dL — AB (ref 3.5–5.0)
ALK PHOS: 846 U/L — AB (ref 40–150)
ALT: 44 U/L (ref 0–55)
AST: 53 U/L — AB (ref 5–34)
Anion Gap: 9 mEq/L (ref 3–11)
BUN: 27.8 mg/dL — AB (ref 7.0–26.0)
CO2: 28 meq/L (ref 22–29)
Calcium: 10 mg/dL (ref 8.4–10.4)
Chloride: 101 mEq/L (ref 98–109)
Creatinine: 1.4 mg/dL — ABNORMAL HIGH (ref 0.6–1.1)
EGFR: 37 mL/min/{1.73_m2} — ABNORMAL LOW (ref >90)
GLUCOSE: 281 mg/dL — AB (ref 70–140)
POTASSIUM: 4.5 meq/L (ref 3.5–5.1)
SODIUM: 138 meq/L (ref 136–145)
TOTAL PROTEIN: 6.8 g/dL (ref 6.4–8.3)
Total Bilirubin: 0.4 mg/dL (ref 0.20–1.20)

## 2017-01-26 LAB — CBC WITH DIFFERENTIAL/PLATELET
BASO%: 0.6 % (ref 0.0–2.0)
Basophils Absolute: 0 10*3/uL (ref 0.0–0.1)
EOS%: 2.2 % (ref 0.0–7.0)
Eosinophils Absolute: 0.1 10*3/uL (ref 0.0–0.5)
HCT: 26.6 % — ABNORMAL LOW (ref 34.8–46.6)
HEMOGLOBIN: 8.6 g/dL — AB (ref 11.6–15.9)
LYMPH%: 17.6 % (ref 14.0–49.7)
MCH: 26.5 pg (ref 25.1–34.0)
MCHC: 32.4 g/dL (ref 31.5–36.0)
MCV: 81.8 fL (ref 79.5–101.0)
MONO#: 0.6 10*3/uL (ref 0.1–0.9)
MONO%: 9.6 % (ref 0.0–14.0)
NEUT%: 70 % (ref 38.4–76.8)
NEUTROS ABS: 4.7 10*3/uL (ref 1.5–6.5)
Platelets: 282 10*3/uL (ref 145–400)
RBC: 3.25 10*6/uL — AB (ref 3.70–5.45)
RDW: 17.2 % — AB (ref 11.2–14.5)
WBC: 6.7 10*3/uL (ref 3.9–10.3)
lymph#: 1.2 10*3/uL (ref 0.9–3.3)

## 2017-01-26 MED ORDER — PACLITAXEL PROTEIN-BOUND CHEMO INJECTION 100 MG
100.0000 mg/m2 | Freq: Once | INTRAVENOUS | Status: AC
  Administered 2017-01-26: 200 mg via INTRAVENOUS
  Filled 2017-01-26: qty 40

## 2017-01-26 MED ORDER — SODIUM CHLORIDE 0.9% FLUSH
10.0000 mL | Freq: Once | INTRAVENOUS | Status: AC
  Administered 2017-01-26: 10 mL
  Filled 2017-01-26: qty 10

## 2017-01-26 MED ORDER — SODIUM CHLORIDE 0.9% FLUSH
10.0000 mL | INTRAVENOUS | Status: DC | PRN
  Administered 2017-01-26: 10 mL
  Filled 2017-01-26: qty 10

## 2017-01-26 MED ORDER — SODIUM CHLORIDE 0.9 % IV SOLN
800.0000 mg/m2 | Freq: Once | INTRAVENOUS | Status: AC
  Administered 2017-01-26: 1520 mg via INTRAVENOUS
  Filled 2017-01-26: qty 39.98

## 2017-01-26 MED ORDER — SODIUM CHLORIDE 0.9 % IV SOLN
Freq: Once | INTRAVENOUS | Status: AC
  Administered 2017-01-26: 15:00:00 via INTRAVENOUS

## 2017-01-26 MED ORDER — PROCHLORPERAZINE MALEATE 10 MG PO TABS
ORAL_TABLET | ORAL | Status: AC
  Filled 2017-01-26: qty 1

## 2017-01-26 MED ORDER — HEPARIN SOD (PORK) LOCK FLUSH 100 UNIT/ML IV SOLN
500.0000 [IU] | Freq: Once | INTRAVENOUS | Status: AC | PRN
  Administered 2017-01-26: 500 [IU]
  Filled 2017-01-26: qty 5

## 2017-01-26 MED ORDER — PROCHLORPERAZINE MALEATE 10 MG PO TABS
10.0000 mg | ORAL_TABLET | Freq: Once | ORAL | Status: AC
  Administered 2017-01-26: 10 mg via ORAL

## 2017-01-26 NOTE — Patient Instructions (Signed)
[facility] Discharge Instructions for Patients Receiving Chemotherapy  Today you received the following chemotherapy agents gemzar/abraxane  To help prevent nausea and vomiting after your treatment, we encourage you to take your nausea medication as directed  If you develop nausea and vomiting that is not controlled by your nausea medication, call the clinic.   BELOW ARE SYMPTOMS THAT SHOULD BE REPORTED IMMEDIATELY:  *FEVER GREATER THAN 100.5 F  *CHILLS WITH OR WITHOUT FEVER  NAUSEA AND VOMITING THAT IS NOT CONTROLLED WITH YOUR NAUSEA MEDICATION  *UNUSUAL SHORTNESS OF BREATH  *UNUSUAL BRUISING OR BLEEDING  TENDERNESS IN MOUTH AND THROAT WITH OR WITHOUT PRESENCE OF ULCERS  *URINARY PROBLEMS  *BOWEL PROBLEMS  UNUSUAL RASH Items with * indicate a potential emergency and should be followed up as soon as possible.  Feel free to call the clinic you have any questions or concerns. The clinic phone number is [phone].  

## 2017-01-26 NOTE — Assessment & Plan Note (Signed)
This is a 71 year old female with pancreatic cancer currently receiving chemotherapy with gemcitabine and Abraxane. She is scheduled for cycle 5 of her chemotherapy today.  The patient was seen with Dr. Benay Spice. Due to her right leg weakness, Dr. Benay Spice has spoken with Dr. Annette Stable who reviewed the MRI results. Verbal report was that the patient has a compression fracture at T12 likely from osteoporosis and stenosis above L1/L2. Was no evidence of cancer noted on the MRI.  Recommend the patient proceed with cycle 5 of chemotherapy today as scheduled. The patient will have a restaging CT of the abdomen and pelvis performed prior to her next cycle of chemotherapy.  For her right leg weakness we will refer her for home health physical therapy.

## 2017-01-26 NOTE — Progress Notes (Signed)
[facility] Follow up:    Patricia Austin, [place] [address]   DIAGNOSIS: Cancer Staging No matching staging information was found for the patient.  Pancreatic cancer.   SUMMARY OF ONCOLOGIC HISTORY:  No history exists.    CURRENT THERAPY: Gemcitabine/Abraxane given every 2 weeks. The patient is here for cycle 5 of her chemotherapy today.  INTERVAL HISTORY: Patricia Austin 71 y.o. female returns for 2 follow up with her husband. Here for evaluation prior to cycle 5 of her chemotherapy. She continues to feel weak and is not really getting out of the house. Her right leg is weaker than the left. She has to hold onto items in her household in order to get around and also has to use a walker helps steady herself. She is not really able to do without the house. She has not had any falls. Denies fevers and chills. Denies abdominal pain, nausea, and vomiting. Denies chest pain and shortness of breath. Reports that she had a recent MRI of her spine by Dr. Cyndy Freeze. She does not yet know the results. She has a known compression fracture at T12 and takes oxycodone as needed.   Patient Active Problem List   Diagnosis Date Noted  . Acute respiratory failure with hypoxia ([place]) 12/31/2016  . Acute on chronic respiratory failure with hypoxia ([place]) 12/30/2016  . Sepsis ([place]) 12/25/2016  . Port catheter in place 12/22/2016  . DM (diabetes mellitus), type 2, uncontrolled ([place]) 10/28/2016  . Confusion 10/28/2016  . Hyperglycemia 10/27/2016  . Pancreatic cancer ([place])   . Hyponatremia 10/07/2016  . Abdominal pain   . Biliary obstruction 09/29/2016  . Multiple duodenal ulcers   . Lumbar adjacent segment disease with spondylolisthesis 08/20/2016  . Peripheral edema 07/18/2016  . CKD (chronic kidney disease) stage 3, GFR 30-59 ml/min 09/21/2015  . Fibromyalgia 09/20/2015  . DJD (degenerative joint disease) of knee 12/14/2013  . Diabetes ([place])  02/09/2013  . Morbid obesity ([place]) 02/09/2013  . Hypertension 02/09/2013  . Hyperlipidemia with target LDL less than 100 02/09/2013  . Osteoarthritis of both feet 02/09/2013  . Osteoarthritis of both knees 02/09/2013  . GERD (gastroesophageal reflux disease) 02/09/2013  . Allergic rhinitis 02/09/2013  . Squamous cell carcinoma of scalp 02/09/2013    has No Known Allergies.  MEDICAL HISTORY: Past Medical History:  Diagnosis Date  . Anxiety    "not all the time"   . Chronic lower back pain   . CKD (chronic kidney disease) stage 3, GFR 30-59 ml/min   . Complication of anesthesia 2002   difficulty /w intubation, with plastic surgery , because she was feeling so anxious. 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 .   Pt. reports that she  is very sensitive to med. & is hard  to wake up.  . Depression    "not all the time"   . Difficult intubation 2002   with head surgery 2002 has done ok with later intubations  . Dysrhythmia    told that she sometimes has extra beats. Does not see cardiologist, had a stress test many yrs. ago>10 yrs. ...update 11-14-16 " i havent had that feelign in a long time "  . Esophageal dysmotility    "getting worse recenly" (12/15/2013)  . Fibromyalgia   . Hypertension   . Nephritis ~ 1955  . Osteoarthritis    knees   . Pancreatic cancer ([place])   . Pancreatic mass   . Renal insufficiency   .  Seasonal allergies    "in the spring"  . Shortness of breath    walking long distances   . squamous cell carcinoma scalp    squamous, on scalp, & has had several surgeries on scalp, for plastics repair of scalp   . Type II diabetes mellitus ([place])     SURGICAL HISTORY: Past Surgical History:  Procedure Laterality Date  . ABDOMINAL HYSTERECTOMY     complete  . APPENDECTOMY    . BACK SURGERY  07/2016   L 4 and L 5 fusion done [facility]   . CARPAL TUNNEL RELEASE Right   . CARPAL TUNNEL RELEASE Left   . COLONOSCOPY    . CYST EXCISION  1990's?   scalp  .  ESOPHAGOGASTRODUODENOSCOPY N/A 09/26/2016   Procedure: ESOPHAGOGASTRODUODENOSCOPY (EGD);  Surgeon: Gatha Mayer, MD;  Location: [facility] ENDOSCOPY;  Service: Endoscopy;  Laterality: N/A;  . EUS N/A 10/09/2016   Procedure: UPPER ENDOSCOPIC ULTRASOUND (EUS) LINEAR;  Surgeon: Milus Banister, MD;  Location: [facility] ENDOSCOPY;  Service: Endoscopy;  Laterality: N/A;  . IR BILIARY STENT(S) EXISTING ACCESS INC DILATION CATH EXCHANGE  10/09/2016  . IR CHOLANGIOGRAM EXISTING TUBE  10/15/2016  . IR FLUORO GUIDE PORT INSERTION RIGHT  11/19/2016  . IR INT EXT BILIARY DRAIN WITH CHOLANGIOGRAM  09/29/2016   2 gallstones  . IR REMOVAL BILIARY DRAIN  10/20/2016  . IR US GUIDE VASC ACCESS RIGHT  11/19/2016  . LUMBAR DISC SURGERY  ~ 2012 X 2   double fusion ; last was 08-21-15  . RADIOLOGY WITH ANESTHESIA N/A 11/19/2016   Procedure: PORT PLACEMENT WITH GENERAL ANESTHESIA;  Surgeon: Arne Cleveland, MD;  Location: [facility] ORS;  Service: Anesthesiology;  Laterality: N/A;  . SKIN GRAFT  2002   scalp; "went to [facility] to [facility] OR earlier in the yearr"  . SQUAMOUS CELL CARCINOMA EXCISION  2002   "off scalp; cancerous area removed and stapled; Dr. Georgia Lopes"  . TONSILLECTOMY  age 47  . TOTAL KNEE ARTHROPLASTY Left 12/14/2013   Procedure: LEFT TOTAL KNEE ARTHROPLASTY;  Surgeon: Ninetta Lights, MD;  Location: [facility];  Service: Orthopedics;  Laterality: Left;  . TOTAL KNEE ARTHROPLASTY Left 12/15/2013    SOCIAL HISTORY: Social History   Social History  . Marital status: Married    Spouse name: N/A  . Number of children: N/A  . Years of education: N/A   Occupational History  . Not on file.   Social History Main Topics  . Smoking status: Never Smoker  . Smokeless tobacco: Never Used  . Alcohol use No  . Drug use: No  . Sexual activity: No   Other Topics Concern  . Not on file   Social History Narrative  . No narrative on file    FAMILY HISTORY: Family History  Problem Relation Age of Onset  . Early death Mother   .  Thrombosis Father     Review of Systems  Constitutional: Negative.   HENT:  Negative.   Eyes: Negative.   Respiratory: Negative.   Cardiovascular: Negative.   Gastrointestinal: Negative.   Genitourinary: Negative.    Musculoskeletal: Positive for back pain and gait problem.       Right leg weakness.  Skin: Positive for itching. Negative for rash and wound.  Neurological: Positive for extremity weakness and gait problem. Negative for dizziness, headaches, light-headedness, numbness, seizures and speech difficulty.  Hematological: Negative.   Psychiatric/Behavioral: Negative.       PHYSICAL EXAMINATION  ECOG PERFORMANCE STATUS: 2 -  Symptomatic, <50% confined to bed  Vitals:   01/26/17 1304  BP: (!) 141/51  Pulse: 86  Resp: 17  Temp: 98.5 F (36.9 C)    Physical Exam  Constitutional: She is oriented to person, place, and time and well-developed, well-nourished, and in no distress. No distress.  Patient is in a wheelchair today.  HENT:  Head: Normocephalic and atraumatic.  Mouth/Throat: Oropharynx is clear and moist. No oropharyngeal exudate.  Eyes: Conjunctivae are normal. No scleral icterus.  Neck: No JVD present. No tracheal deviation present.  Cardiovascular: Normal rate, regular rhythm, normal heart sounds and intact distal pulses.   Pulmonary/Chest: Effort normal and breath sounds normal. No respiratory distress. She has no wheezes. She has no rales.  Abdominal: Soft. Bowel sounds are normal. She exhibits no distension. There is no tenderness.  Musculoskeletal: She exhibits no edema.  Decreased strength to her right leg.  Lymphadenopathy:    She has no cervical adenopathy.  Neurological: She is alert and oriented to person, place, and time.  Skin: Skin is warm and dry. No rash noted. She is not diaphoretic. No erythema. No pallor.  Psychiatric: Mood, memory, affect and judgment normal.  Vitals reviewed.   LABORATORY DATA:  CBC    Component Value Date/Time    WBC 6.7 01/26/2017 1235   WBC 8.5 12/30/2016 2223   RBC 3.25 (L) 01/26/2017 1235   RBC 3.06 (L) 12/30/2016 2223   HGB 8.6 (L) 01/26/2017 1235   HCT 26.6 (L) 01/26/2017 1235   PLT 282 01/26/2017 1235   PLT 301 12/29/2016 0000   MCV 81.8 01/26/2017 1235   MCH 26.5 01/26/2017 1235   MCH 27.8 12/30/2016 2223   MCHC 32.4 01/26/2017 1235   MCHC 33.2 12/30/2016 2223   RDW 17.2 (H) 01/26/2017 1235   LYMPHSABS 1.2 01/26/2017 1235   MONOABS 0.6 01/26/2017 1235   EOSABS 0.1 01/26/2017 1235   EOSABS 0.1 12/29/2016 0000   BASOSABS 0.0 01/26/2017 1235    CMP     Component Value Date/Time   NA 138 01/26/2017 1235   K 4.5 01/26/2017 1235   CL 99 (L) 01/01/2017 0323   CO2 28 01/26/2017 1235   GLUCOSE 281 (H) 01/26/2017 1235   BUN 27.8 (H) 01/26/2017 1235   CREATININE 1.4 (H) 01/26/2017 1235   CALCIUM 10.0 01/26/2017 1235   PROT 6.8 01/26/2017 1235   ALBUMIN 2.6 (L) 01/26/2017 1235   AST 53 (H) 01/26/2017 1235   ALT 44 01/26/2017 1235   ALKPHOS 846 (H) 01/26/2017 1235   BILITOT 0.40 01/26/2017 1235   GFRNONAA 31 (L) 01/01/2017 0323   GFRAA 36 (L) 01/01/2017 0323    RADIOGRAPHIC STUDIES:  No results found.   ASSESSMENT and THERAPY PLAN:   Pancreatic cancer [facility]) This is a 71 year old female with pancreatic cancer currently receiving chemotherapy with gemcitabine and Abraxane. She is scheduled for cycle 5 of her chemotherapy today.  The patient was seen with Dr. Benay Spice. Due to her right leg weakness, Dr. Benay Spice has spoken with Dr. Annette Stable who reviewed the MRI results. Verbal report was that the patient has a compression fracture at T12 likely from osteoporosis and stenosis above L1/L2. Was no evidence of cancer noted on the MRI.  Recommend the patient proceed with cycle 5 of chemotherapy today as scheduled. The patient will have a restaging CT of the abdomen and pelvis performed prior to her next cycle of chemotherapy.  For her right leg weakness we will refer her for home  health physical therapy.  Orders Placed This Encounter  Procedures  . CT Abdomen Pelvis W Contrast    Pt with pancreatic cancer. Assess response to chemotherapy.    Standing Status:   Future    Standing Expiration Date:   01/26/2018    Order Specific Question:   If indicated for the ordered procedure, I authorize the administration of contrast media per Radiology protocol    Answer:   Yes    Order Specific Question:   Preferred imaging location?    Answer:   [facility]    Order Specific Question:   Radiology Contrast Protocol - do NOT remove file path    Answer:   [email or web address]    All questions were answered. The patient knows to call the clinic with any problems, questions or concerns. We can certainly see the patient much sooner if necessary.  Mikey Bussing, NP 01/26/2017   This was a shared visit with Mikey Bussing. Ms. Sedore was interviewed and examined. She has right leg weakness, 3+/5 strength proximally, the strength appears intact in the right foot.  She has developed proximal right leg weakness. I discussed the MRI findings with Dr. Annette Stable. He reports there is no explanation for the weakness on the MRI and no evidence of metastatic pancreas cancer. We will refer her for physical therapy. She will complete a treatment with gemcitabine/Abraxane today and then undergo restaging CTs.  Julieanne Manson, M.D.

## 2017-01-27 LAB — CANCER ANTIGEN 19-9

## 2017-02-04 ENCOUNTER — Ambulatory Visit (HOSPITAL_COMMUNITY)
Admission: RE | Admit: 2017-02-04 | Discharge: 2017-02-04 | Disposition: A | Discharge status: Home / Self Care | Payer: Medicare Other | Source: Ambulatory Visit | Attending: Oncology | Admitting: Oncology

## 2017-02-04 ENCOUNTER — Encounter (HOSPITAL_COMMUNITY): Payer: Self-pay

## 2017-02-04 DIAGNOSIS — K8020 Calculus of gallbladder without cholecystitis without obstruction: Secondary | ICD-10-CM | POA: Diagnosis not present

## 2017-02-04 DIAGNOSIS — C259 Malignant neoplasm of pancreas, unspecified: Secondary | ICD-10-CM

## 2017-02-04 DIAGNOSIS — R911 Solitary pulmonary nodule: Secondary | ICD-10-CM | POA: Diagnosis not present

## 2017-02-04 DIAGNOSIS — K869 Disease of pancreas, unspecified: Secondary | ICD-10-CM | POA: Insufficient documentation

## 2017-02-04 MED ORDER — IOPAMIDOL (ISOVUE-300) INJECTION 61%
30.0000 mL | Freq: Once | INTRAVENOUS | Status: AC | PRN
  Administered 2017-02-04: 30 mL via ORAL

## 2017-02-04 MED ORDER — IOPAMIDOL (ISOVUE-300) INJECTION 61%
100.0000 mL | Freq: Once | INTRAVENOUS | Status: AC | PRN
  Administered 2017-02-04: 100 mL via INTRAVENOUS

## 2017-02-04 MED ORDER — HEPARIN SOD (PORK) LOCK FLUSH 100 UNIT/ML IV SOLN
500.0000 [IU] | Freq: Once | INTRAVENOUS | Status: AC
  Administered 2017-02-04: 500 [IU] via INTRAVENOUS

## 2017-02-04 MED ORDER — HEPARIN SOD (PORK) LOCK FLUSH 100 UNIT/ML IV SOLN
INTRAVENOUS | Status: AC
  Filled 2017-02-04: qty 5

## 2017-02-04 MED ORDER — IOPAMIDOL (ISOVUE-300) INJECTION 61%
INTRAVENOUS | Status: AC
  Administered 2017-02-04: 30 mL via ORAL
  Filled 2017-02-04: qty 30

## 2017-02-04 MED ORDER — IOPAMIDOL (ISOVUE-300) INJECTION 61%
INTRAVENOUS | Status: AC
  Administered 2017-02-04: 100 mL via INTRAVENOUS
  Filled 2017-02-04: qty 100

## 2017-02-10 ENCOUNTER — Ambulatory Visit: Payer: Medicare Other

## 2017-02-10 ENCOUNTER — Telehealth: Payer: Self-pay | Admitting: Oncology

## 2017-02-10 ENCOUNTER — Other Ambulatory Visit (HOSPITAL_BASED_OUTPATIENT_CLINIC_OR_DEPARTMENT_OTHER): Payer: Medicare Other

## 2017-02-10 ENCOUNTER — Ambulatory Visit (HOSPITAL_BASED_OUTPATIENT_CLINIC_OR_DEPARTMENT_OTHER): Payer: Medicare Other | Admitting: Oncology

## 2017-02-10 VITALS — BP 119/54 | HR 68 | Temp 98.7°F | Wt 169.8 lb

## 2017-02-10 DIAGNOSIS — Z95828 Presence of other vascular implants and grafts: Secondary | ICD-10-CM

## 2017-02-10 DIAGNOSIS — M545 Low back pain: Secondary | ICD-10-CM | POA: Diagnosis not present

## 2017-02-10 DIAGNOSIS — C259 Malignant neoplasm of pancreas, unspecified: Secondary | ICD-10-CM

## 2017-02-10 DIAGNOSIS — C250 Malignant neoplasm of head of pancreas: Secondary | ICD-10-CM

## 2017-02-10 DIAGNOSIS — R531 Weakness: Secondary | ICD-10-CM

## 2017-02-10 DIAGNOSIS — C258 Malignant neoplasm of overlapping sites of pancreas: Secondary | ICD-10-CM

## 2017-02-10 LAB — COMPREHENSIVE METABOLIC PANEL
ALT: 27 U/L (ref 0–55)
AST: 45 U/L — AB (ref 5–34)
Albumin: 2.5 g/dL — ABNORMAL LOW (ref 3.5–5.0)
Alkaline Phosphatase: 770 U/L — ABNORMAL HIGH (ref 40–150)
Anion Gap: 7 mEq/L (ref 3–11)
BILIRUBIN TOTAL: 0.32 mg/dL (ref 0.20–1.20)
BUN: 25.5 mg/dL (ref 7.0–26.0)
CO2: 29 meq/L (ref 22–29)
Calcium: 9.8 mg/dL (ref 8.4–10.4)
Chloride: 101 mEq/L (ref 98–109)
Creatinine: 1.3 mg/dL — ABNORMAL HIGH (ref 0.6–1.1)
EGFR: 41 mL/min/{1.73_m2} — AB (ref >90)
GLUCOSE: 128 mg/dL (ref 70–140)
Potassium: 4.1 mEq/L (ref 3.5–5.1)
SODIUM: 137 meq/L (ref 136–145)
TOTAL PROTEIN: 6.5 g/dL (ref 6.4–8.3)

## 2017-02-10 LAB — CBC WITH DIFFERENTIAL/PLATELET
BASO%: 0.8 % (ref 0.0–2.0)
Basophils Absolute: 0.1 10*3/uL (ref 0.0–0.1)
EOS ABS: 0.1 10*3/uL (ref 0.0–0.5)
EOS%: 1.7 % (ref 0.0–7.0)
HCT: 26.3 % — ABNORMAL LOW (ref 34.8–46.6)
HGB: 8.5 g/dL — ABNORMAL LOW (ref 11.6–15.9)
LYMPH%: 16.8 % (ref 14.0–49.7)
MCH: 26.5 pg (ref 25.1–34.0)
MCHC: 32.4 g/dL (ref 31.5–36.0)
MCV: 81.7 fL (ref 79.5–101.0)
MONO#: 0.8 10*3/uL (ref 0.1–0.9)
MONO%: 9.9 % (ref 0.0–14.0)
NEUT%: 70.8 % (ref 38.4–76.8)
NEUTROS ABS: 5.9 10*3/uL (ref 1.5–6.5)
Platelets: 417 10*3/uL — ABNORMAL HIGH (ref 145–400)
RBC: 3.22 10*6/uL — ABNORMAL LOW (ref 3.70–5.45)
RDW: 17.5 % — AB (ref 11.2–14.5)
WBC: 8.3 10*3/uL (ref 3.9–10.3)
lymph#: 1.4 10*3/uL (ref 0.9–3.3)

## 2017-02-10 MED ORDER — HEPARIN SOD (PORK) LOCK FLUSH 100 UNIT/ML IV SOLN
500.0000 [IU] | INTRAVENOUS | Status: AC | PRN
  Administered 2017-02-10: 500 [IU]
  Filled 2017-02-10: qty 5

## 2017-02-10 MED ORDER — SODIUM CHLORIDE 0.9% FLUSH
10.0000 mL | INTRAVENOUS | Status: AC | PRN
  Administered 2017-02-10: 10 mL
  Filled 2017-02-10: qty 10

## 2017-02-10 MED ORDER — SODIUM CHLORIDE 0.9% FLUSH
10.0000 mL | Freq: Once | INTRAVENOUS | Status: AC
  Administered 2017-02-10: 10 mL
  Filled 2017-02-10: qty 10

## 2017-02-10 MED ORDER — OXYCODONE HCL 10 MG PO TABS: 10.0000 mg | ORAL_TABLET | Freq: Four times a day (QID) | ORAL | 0 refills | Status: DC | PRN

## 2017-02-10 NOTE — Progress Notes (Signed)
[place] OFFICE PROGRESS NOTE   Diagnosis: Pancreas cancer  INTERVAL HISTORY:   Patricia Austin returns as scheduled. She completed another cycle of gemcitabine/Abraxane on 01/26/2017. She tolerated chemotherapy well. No fever or rash. She continues to have right lower back pain and right leg weakness. She takes oxycodone for relief of pain. No abdominal pain. She can ambulate with a walker. She has not seen Dr. Cyndy Freeze. Ms. Circle reports malaise and anorexia.  Objective:  Vital signs in last 24 hours:  Blood pressure (!) 119/54, pulse 68, temperature 98.7 F (37.1 C), temperature source Oral, weight 169 lb 12.8 oz (77 kg).    HEENT: No thrush or ulcers Resp: Lungs clear bilaterally Cardio: Regular rate and rhythm GI: The liver edge is palpable a few fingers below the right costal margin, no mass, no splenomegaly Vascular: No leg edema Neuro: 3+-4/5 strength with flexion at the right hip and extension at the right knee. 3+/5 strength with dorsi flexion at the right foot. 5/5 plantar flexion at the right foot. She can ambulate with assistance.    Portacath/PICC-without erythema  Lab Results:  Lab Results  Component Value Date   WBC 8.3 02/10/2017   HGB 8.5 (L) 02/10/2017   HCT 26.3 (L) 02/10/2017   MCV 81.7 02/10/2017   PLT 417 (H) 02/10/2017   NEUTROABS 5.9 02/10/2017    CMP     Component Value Date/Time   NA 137 02/10/2017 0755   K 4.1 02/10/2017 0755   CL 99 (L) 01/01/2017 0323   CO2 29 02/10/2017 0755   GLUCOSE 128 02/10/2017 0755   BUN 25.5 02/10/2017 0755   CREATININE 1.3 (H) 02/10/2017 0755   CALCIUM 9.8 02/10/2017 0755   PROT 6.5 02/10/2017 0755   ALBUMIN 2.5 (L) 02/10/2017 0755   AST 45 (H) 02/10/2017 0755   ALT 27 02/10/2017 0755   ALKPHOS 770 (H) 02/10/2017 0755   BILITOT 0.32 02/10/2017 0755   GFRNONAA 31 (L) 01/01/2017 0323   GFRAA 36 (L) 01/01/2017 0323    CA 19-9 on 01/26/2017-5330  Medications: I have reviewed the patient's  current medications.  Assessment/Plan: 1. Adenocarcinoma the pancreas-pancreas head/uncinatemass noted on a CT abdomen/pelvis 09/20/2016, status post an EUS biopsy 10/09/2016 confirming adenocarcinoma  CT 09/20/2016-pancreas head/uncinatemass, possible contact with the superior mesenteric artery, biliary dilatation, small low lung nodules  CTs chest, abdomen, and pelvis 10/27/2016-enlarging pancreas head mass with vascular abutment, no evidence of metastatic disease  Cycle 1 gemcitabine/Abraxane 11/25/2016  Cycle 2 gemcitabine/Abraxane 12/08/2016  Cycle 3 gemcitabine/Abraxane 12/22/2016  Cycle 4 gemcitabine/Abraxane 01/13/2017  Cycle 5 gemcitabine/Abraxane 01/26/2017  CT 10/05/2016-stable pancreas mass with abutment of the aorta, superior mesenteric artery, portal vein, and superior Mr. brain. No evidence of liver metastases. Stable right lower lobe nodule.  2. Biliary obstruction secondary to #1, status post placement of a wall stent and external biliary drain 10/09/2016  Biliary drain removed 10/20/2016  3. Diabetes-admitted with marked hyperglycemia 10/27/2016  4. History of squamous cell carcinoma of the scalp  5. Lumbar fusion surgery February 2018  6. 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 History of Abdominal/back pain secondary to pancreas cancer  8. Cholangitis 10/07/2016  9. Confusionduring hospital admission 10/27/2016-etiology unclear, MRI brain negative for metastatic disease on 10/29/2016, improved  10. Port-A-Cath placement 11/19/2016  11. Admission 12/30/2016 with dyspnea, chest CT consistent with pulmonary edema  12.  Right leg weakness-MRI lumbar spine August 2018-no evidence of metastatic disease   Disposition:  She  appears unchanged. The CT reveals a stable pancreas mass and no evidence of metastatic disease. The CA-19-9 remains elevated. It is unclear whether she is  benefiting from the gemcitabine/Abraxane. We decided to discontinue gemcitabine/Abraxane. I will refer her to Dr. Lisbeth Renshaw to consider SBRT. We will administer concurrent capecitabine if Dr. Lisbeth Renshaw decides on a standard course of radiation.  The etiology of the right leg weakness is unclear. This does not appear to be related to metastatic pancreas cancer. We will be sure she has an appointment with Dr. Cyndy Freeze for further evaluation.  Patricia Austin will return for an office visit in 3 weeks. I am available to see her sooner as needed.  25 minutes were spent with the patient today. The majority of time was used for counseling and coordination of care.  Donneta Romberg, MD  02/10/2017  9:07 AM

## 2017-02-10 NOTE — Patient Instructions (Signed)
Implanted Port Home Guide An implanted port is a type of central line that is placed under the skin. Central lines are used to provide IV access when treatment or nutrition needs to be given through a person's veins. Implanted ports are used for long-term IV access. An implanted port may be placed because:  You need IV medicine that would be irritating to the small veins in your hands or arms.  You need long-term IV medicines, such as antibiotics.  You need IV nutrition for a long period.  You need frequent blood draws for lab tests.  You need dialysis.  Implanted ports are usually placed in the chest area, but they can also be placed in the upper arm, the abdomen, or the leg. An implanted port has two main parts:  Reservoir. The reservoir is round and will appear as a small, raised area under your skin. The reservoir is the part where a needle is inserted to give medicines or draw blood.  Catheter. The catheter is a thin, flexible tube that extends from the reservoir. The catheter is placed into a large vein. Medicine that is inserted into the reservoir goes into the catheter and then into the vein.  How will I care for my incision site? Do not get the incision site wet. Bathe or shower as directed by your health care provider. How is my port accessed? Special steps must be taken to access the port:  Before the port is accessed, a numbing cream can be placed on the skin. This helps numb the skin over the port site.  Your health care provider uses a sterile technique to access the port. ? Your health care provider must put on a mask and sterile gloves. ? The skin over your port is cleaned carefully with an antiseptic and allowed to dry. ? The port is gently pinched between sterile gloves, and a needle is inserted into the port.  Only "non-coring" port needles should be used to access the port. Once the port is accessed, a blood return should be checked. This helps ensure that the port  is in the vein and is not clogged.  If your port needs to remain accessed for a constant infusion, a clear (transparent) bandage will be placed over the needle site. The bandage and needle will need to be changed every week, or as directed by your health care provider.  Keep the bandage covering the needle clean and dry. Do not get it wet. Follow your health care provider's instructions on how to take a shower or bath while the port is accessed.  If your port does not need to stay accessed, no bandage is needed over the port.  What is flushing? Flushing helps keep the port from getting clogged. Follow your health care provider's instructions on how and when to flush the port. Ports are usually flushed with saline solution or a medicine called heparin. The need for flushing will depend on how the port is used.  If the port is used for intermittent medicines or blood draws, the port will need to be flushed: ? After medicines have been given. ? After blood has been drawn. ? As part of routine maintenance.  If a constant infusion is running, the port may not need to be flushed.  How long will my port stay implanted? The port can stay in for as long as your health care provider thinks it is needed. When it is time for the port to come out, surgery will be   done to remove it. The procedure is similar to the one performed when the port was put in. When should I seek immediate medical care? When you have an implanted port, you should seek immediate medical care if:  You notice a bad smell coming from the incision site.  You have swelling, redness, or drainage at the incision site.  You have more swelling or pain at the port site or the surrounding area.  You have a fever that is not controlled with medicine.  This information is not intended to replace advice given to you by your health care provider. Make sure you discuss any questions you have with your health care provider. Document  Released: 06/09/2005 Document Revised: 11/15/2015 Document Reviewed: 02/14/2013 Elsevier Interactive Patient Education  2017 Elsevier Inc.  

## 2017-02-10 NOTE — Telephone Encounter (Signed)
Gave patient avs and calendar with upcoming appts.  °

## 2017-02-11 ENCOUNTER — Ambulatory Visit
Admission: RE | Admit: 2017-02-11 | Discharge: 2017-02-11 | Disposition: A | Discharge status: Home / Self Care | Payer: Medicare Other | Source: Ambulatory Visit | Attending: Radiation Oncology | Admitting: Radiation Oncology

## 2017-02-11 ENCOUNTER — Encounter: Payer: Self-pay | Admitting: Radiation Oncology

## 2017-02-11 VITALS — BP 117/62 | HR 71 | Temp 98.1°F | Resp 20 | Ht 63.0 in | Wt 167.8 lb

## 2017-02-11 DIAGNOSIS — F419 Anxiety disorder, unspecified: Secondary | ICD-10-CM | POA: Insufficient documentation

## 2017-02-11 DIAGNOSIS — I129 Hypertensive chronic kidney disease with stage 1 through stage 4 chronic kidney disease, or unspecified chronic kidney disease: Secondary | ICD-10-CM | POA: Insufficient documentation

## 2017-02-11 DIAGNOSIS — M797 Fibromyalgia: Secondary | ICD-10-CM | POA: Diagnosis not present

## 2017-02-11 DIAGNOSIS — E1122 Type 2 diabetes mellitus with diabetic chronic kidney disease: Secondary | ICD-10-CM | POA: Diagnosis not present

## 2017-02-11 DIAGNOSIS — Z96652 Presence of left artificial knee joint: Secondary | ICD-10-CM | POA: Insufficient documentation

## 2017-02-11 DIAGNOSIS — M545 Low back pain: Secondary | ICD-10-CM | POA: Insufficient documentation

## 2017-02-11 DIAGNOSIS — F329 Major depressive disorder, single episode, unspecified: Secondary | ICD-10-CM | POA: Insufficient documentation

## 2017-02-11 DIAGNOSIS — Z510 Encounter for antineoplastic radiation therapy: Secondary | ICD-10-CM | POA: Diagnosis not present

## 2017-02-11 DIAGNOSIS — Z90710 Acquired absence of both cervix and uterus: Secondary | ICD-10-CM | POA: Insufficient documentation

## 2017-02-11 DIAGNOSIS — N183 Chronic kidney disease, stage 3 (moderate): Secondary | ICD-10-CM | POA: Insufficient documentation

## 2017-02-11 DIAGNOSIS — C250 Malignant neoplasm of head of pancreas: Secondary | ICD-10-CM

## 2017-02-11 DIAGNOSIS — Z981 Arthrodesis status: Secondary | ICD-10-CM | POA: Diagnosis not present

## 2017-02-11 DIAGNOSIS — Z79899 Other long term (current) drug therapy: Secondary | ICD-10-CM | POA: Diagnosis not present

## 2017-02-11 DIAGNOSIS — M179 Osteoarthritis of knee, unspecified: Secondary | ICD-10-CM | POA: Insufficient documentation

## 2017-02-11 DIAGNOSIS — Z85828 Personal history of other malignant neoplasm of skin: Secondary | ICD-10-CM | POA: Diagnosis not present

## 2017-02-11 DIAGNOSIS — G8929 Other chronic pain: Secondary | ICD-10-CM | POA: Insufficient documentation

## 2017-02-11 DIAGNOSIS — Z794 Long term (current) use of insulin: Secondary | ICD-10-CM | POA: Diagnosis not present

## 2017-02-11 LAB — CANCER ANTIGEN 19-9

## 2017-02-11 NOTE — Progress Notes (Addendum)
Radiation Oncology         ([phone]) [phone] ________________________________  Name: Patricia Austin        MRN: [id number]  Date of Service: 02/11/2017 DOB: 1946/05/27  NL:[id number], Mary-Margaret, FNP  Benay Spice Izola Price, MD     REFERRING PHYSICIAN: Ladell Pier, MD   DIAGNOSIS: The encounter diagnosis was Malignant neoplasm of head of pancreas ([place]).   HISTORY OF PRESENT ILLNESS: Patricia Austin is a 71 y.o. female seen at the request of Dr. Benay Spice for a recent diagnosis of pancreatic cancer. The patient presented with symptoms of pruritis and abdominal pain and was found to be jaundiced when she was seen in the ED on 09/20/16. She had CT of the abdomen that revealed intrahepatic biliary dilatation, as well as dilation of the CBD, and a mass at the head of the pancreas measuring 4 x 3.1 x 3.9 cm at the level of the uncinate process. The lesion partially encases small SMA branch vessels. A biliary stent was placed, a biopsy of the duodenum obtained and negative, and stent exchanged in April 2018. An EUS with biopsy on 10/09/16 revealed adenocarcinoma of the pancreas. She was not a surgical candidate at the time due to her vessel involvement, and went on to proceed with Gemzar/Abraxane which she completed 3 cycles between 11/25/16-01/26/17. She had restaging scans on 02/04/17 revealing her mass in the pancrease measuring 7.3 x 4.4 cm abutting and partially encasing the SMA, and abuts the portal vein and superior mesenteric vein. It may also abut the proper hepatic artery and porta hepatis, and anterior margin of the abdominal aorta.     PREVIOUS RADIATION THERAPY: No   PAST MEDICAL HISTORY:  Past Medical History:  Diagnosis Date  . Anxiety    "not all the time"   . Chronic lower back pain   . CKD (chronic kidney disease) stage 3, GFR 30-59 ml/min   . Complication of anesthesia 2002   difficulty /w intubation, with plastic surgery , because she was feeling so anxious. She states she has larygnospasms with  anxiety. .   Pt. reports that she  is very sensitive to med. & is hard  to wake up.  . Depression    "not all the time"   . Difficult intubation 2002   with head surgery 2002 has done ok with later intubations  . Dysrhythmia    told that she sometimes has extra beats. Does not see cardiologist, had a stress test many yrs. ago>10 yrs. ...update 11-14-16 " i havent had that feelign in a long time "  . Esophageal dysmotility    "getting worse recenly" (12/15/2013)  . Fibromyalgia   . Hypertension   . Nephritis ~ 1955  . Osteoarthritis    knees   . Pancreatic cancer ([place])   . Pancreatic mass   . Renal insufficiency   . Seasonal allergies    "in the spring"  . Shortness of breath    walking long distances   . squamous cell carcinoma scalp    squamous, on scalp, & has had several surgeries on scalp, for plastics repair of scalp   . Type II diabetes mellitus ([place])        PAST SURGICAL HISTORY: Past Surgical History:  Procedure Laterality Date  . ABDOMINAL HYSTERECTOMY     complete  . APPENDECTOMY    . BACK SURGERY  07/2016   L 4 and L 5 fusion done [facility]   . CARPAL TUNNEL RELEASE Right   .  CARPAL TUNNEL RELEASE Left   . COLONOSCOPY    . CYST EXCISION  1990's?   scalp  . ESOPHAGOGASTRODUODENOSCOPY N/A 09/26/2016   Procedure: ESOPHAGOGASTRODUODENOSCOPY (EGD);  Surgeon: Gatha Mayer, MD;  Location: [facility] ENDOSCOPY;  Service: Endoscopy;  Laterality: N/A;  . EUS N/A 10/09/2016   Procedure: UPPER ENDOSCOPIC ULTRASOUND (EUS) LINEAR;  Surgeon: Milus Banister, MD;  Location: [facility] ENDOSCOPY;  Service: Endoscopy;  Laterality: N/A;  . IR BILIARY STENT(S) EXISTING ACCESS INC DILATION CATH EXCHANGE  10/09/2016  . IR CHOLANGIOGRAM EXISTING TUBE  10/15/2016  . IR FLUORO GUIDE PORT INSERTION RIGHT  11/19/2016  . IR INT EXT BILIARY DRAIN WITH CHOLANGIOGRAM  09/29/2016   2 gallstones  . IR REMOVAL BILIARY DRAIN  10/20/2016  . IR US GUIDE VASC ACCESS RIGHT  11/19/2016  . LUMBAR DISC SURGERY  ~ 2012 X 2     double fusion ; last was 08-21-15  . RADIOLOGY WITH ANESTHESIA N/A 11/19/2016   Procedure: PORT PLACEMENT WITH GENERAL ANESTHESIA;  Surgeon: Arne Cleveland, MD;  Location: [facility] ORS;  Service: Anesthesiology;  Laterality: N/A;  . SKIN GRAFT  2002   scalp; "went to [facility] to repair OR earlier in the yearr"  . SQUAMOUS CELL CARCINOMA EXCISION  2002   "off scalp; cancerous area removed and stapled; Dr. Georgia Lopes"  . TONSILLECTOMY  age 39  . TOTAL KNEE ARTHROPLASTY Left 12/14/2013   Procedure: LEFT TOTAL KNEE ARTHROPLASTY;  Surgeon: Ninetta Lights, MD;  Location: [facility];  Service: Orthopedics;  Laterality: Left;  . TOTAL KNEE ARTHROPLASTY Left 12/15/2013     FAMILY HISTORY:  Family History  Problem Relation Age of Onset  . Early death Mother   . Thrombosis Father      SOCIAL HISTORY:  reports that she has never smoked. She has never used smokeless tobacco. She reports that she does not drink alcohol or use drugs.   ALLERGIES: Patient has no known allergies.   MEDICATIONS:  Current Outpatient Prescriptions  Medication Sig Dispense Refill  . acetaminophen (TYLENOL) 500 MG tablet Take 500-1,000 mg by mouth every 6 (six) hours as needed for moderate pain.    Marland Kitchen amLODipine (NORVASC) 5 MG tablet Take 1 tablet (5 mg total) by mouth daily. 90 tablet 3  . carvedilol (COREG) 25 MG tablet TAKE  (1)  TABLET TWICE A DAY WITH MEALS (BREAKFAST AND SUPPER) (Patient taking differently: Take 25 mg by mouth 2 (two) times daily with a meal. ) 180 tablet 1  . DULoxetine (CYMBALTA) 60 MG capsule Take 1 capsule (60 mg total) by mouth daily.    . furosemide (LASIX) 20 MG tablet Take 1-2 tablets (20-40 mg total) by mouth daily. Take 40mg  daily for 3days then take 20mg  every other day 30 tablet 1  . glucose blood (ONE TOUCH ULTRA TEST) test strip Test up to qid. DX:E11.65 100 each 3  . Insulin Glargine (TOUJEO SOLOSTAR) 300 UNIT/ML SOPN Inject 15 Units into the skin daily.    . insulin lispro (HUMALOG) 100 UNIT/ML  injection Up to 15u with each meal (Patient taking differently: Inject 1-15 Units into the skin 3 (three) times daily with meals. Sliding scale) 10 mL prn  . lidocaine-prilocaine (EMLA) cream Apply to port site one hour prior to use. Do not rub in. Cover with plastic. 30 g 0  . ondansetron (ZOFRAN) 8 MG tablet Take 1 tablet (8 mg total) by mouth every 8 (eight) hours as needed for nausea or vomiting. 20 tablet 1  . Oxycodone  HCl 10 MG TABS Take 1 tablet (10 mg total) by mouth every 6 (six) hours as needed. 60 tablet 0  . pantoprazole (PROTONIX) 40 MG tablet Take 1 tablet (40 mg total) by mouth daily before breakfast. 30 tablet 5   No current facility-administered medications for this encounter.    Facility-Administered Medications Ordered in Other Encounters  Medication Dose Route Frequency Provider Last Rate Last Dose  . sodium chloride flush (NS) 0.9 % injection 10 mL  10 mL Intravenous PRN Ladell Pier, MD   10 mL at 11/25/16 1510     REVIEW OF SYSTEMS: On review of systems, the patient reports that she is doing well overall. Her main concern is having needing oral surgery and chronic right knee and low back pain. She describes a history of osteoarthritis and has not been able to pursue treatment for her joints or teeth due to treatment. She denies any chest pain, shortness of breath, cough, fevers, chills, night sweats, unintended weight changes. She denies any bowel or bladder disturbances, and denies abdominal pain, nausea or vomiting. She reports a poor appetite but denies the ability to eat. She  denies any new musculoskeletal or joint aches or pains. A complete review of systems is obtained and is otherwise negative.     PHYSICAL EXAM:  Wt Readings from Last 3 Encounters:  02/10/17 169 lb 12.8 oz (77 kg)  01/26/17 172 lb 6.4 oz (78.2 kg)  01/13/17 170 lb 11.2 oz (77.4 kg)   Temp Readings from Last 3 Encounters:  02/10/17 98.7 F (37.1 C) (Oral)  01/26/17 98.5 F (36.9 C)  (Oral)  01/13/17 97.7 F (36.5 C) (Oral)   BP Readings from Last 3 Encounters:  02/10/17 (!) 119/54  01/26/17 (!) 141/51  01/13/17 120/84   Pulse Readings from Last 3 Encounters:  02/10/17 68  01/26/17 86  01/13/17 90    In general this is a well appearing caucasian female in no acute distress. She is alert and oriented x4 and appropriate throughout the examination. HEENT reveals that the patient is normocephalic, atraumatic. EOMs are intact. PERRLA. Skin is intact without any evidence of gross lesions. Cardiovascular exam reveals a regular rate and rhythm, no clicks rubs or murmurs are auscultated. Chest is clear to auscultation bilaterally. Lymphatic assessment is performed and does not reveal any adenopathy in the cervical, supraclavicular, axillary, or inguinal chains. Abdomen has active bowel sounds in all quadrants and is intact. The abdomen is soft, non tender, non distended. Lower extremities are negative for pretibial pitting edema, deep calf tenderness, cyanosis or clubbing.   ECOG = 1  0 - Asymptomatic (Fully active, able to carry on all predisease activities without restriction)  1 - Symptomatic but completely ambulatory (Restricted in physically strenuous activity but ambulatory and able to carry out work of a light or sedentary nature. For example, light housework, office work)  2 - Symptomatic, <50% in bed during the day (Ambulatory and capable of all self care but unable to carry out any work activities. Up and about more than 50% of waking hours)  3 - Symptomatic, >50% in bed, but not bedbound (Capable of only limited self-care, confined to bed or chair 50% or more of waking hours)  4 - Bedbound (Completely disabled. Cannot carry on any self-care. Totally confined to bed or chair)  5 - Death   Eustace Pen MM, Creech RH, Tormey DC, et al. [phone]). "Toxicity and response criteria of the [facility]". [place] Oncol. 5 (  6): 649-55    LABORATORY  DATA:  Lab Results  Component Value Date   WBC 8.3 02/10/2017   HGB 8.5 (L) 02/10/2017   HCT 26.3 (L) 02/10/2017   MCV 81.7 02/10/2017   PLT 417 (H) 02/10/2017   Lab Results  Component Value Date   NA 137 02/10/2017   K 4.1 02/10/2017   CL 99 (L) 01/01/2017   CO2 29 02/10/2017   Lab Results  Component Value Date   ALT 27 02/10/2017   AST 45 (H) 02/10/2017   ALKPHOS 770 (H) 02/10/2017   BILITOT 0.32 02/10/2017      RADIOGRAPHY: Ct Abdomen Pelvis W Contrast  Result Date: 02/04/2017 CLINICAL DATA:  Pancreatic cancer diagnosed in April 2018, chemotherapy in progress. EXAM: CT ABDOMEN AND PELVIS WITH CONTRAST TECHNIQUE: Multidetector CT imaging of the abdomen and pelvis was performed using the standard protocol following bolus administration of intravenous contrast. CONTRAST:  80 cc Isovue 300 COMPARISON:  Multiple exams, including 10/27/2016 FINDINGS: Lower chest: 4 by 5 mm right lower lobe nodule on image 27/4, stable. Densely calcified granuloma in the left lower lobe. Mild cardiomegaly. Hepatobiliary: Pneumobilia. Expandable biliary stent extends from the common bile duct through the pancreatic head region and into the duodenum, satisfactorily position. There is some narrowing in the central portion of the stent, greater than was present previously, and narrowing the central lumen. The margin of the stent is narrowed to 7 mm on image 62/5, previously the minimum diameter was 9 mm. Gallstones are present including a full laminated 3.0 cm gallstone in the gallbladder. No obvious hepatic mass. Nodularity of the liver border is observed. Pancreas: The hypodense pancreatic mass measures approximately 7.3 by 4.4 cm and by my measurements was previously about the same. The mass substantially abuts and partially encases the superior mesenteric artery, and significantly abuts the portal vein and posterior margin of the superior mesenteric vein. The mass may be abutting the proper hepatic artery in  the porta hepatis. The mass abuts the anterior margin of the abdominal aorta near the inferior mesenteric artery takeoff for example image 52/5. The mass is also poorly separable from the transverse and descending duodenum. No overt dilatation of dorsal pancreatic duct at this time. Spleen: Old granulomatous disease. 0.7 cm hypodense lesion inferiorly in the spleen on image 43/5 is somewhat more distinct on today's exam. Adrenals/Urinary Tract: Several small hypodense renal lesions are similar to the prior exam and likely represent small renal cysts better technically too small to characterize. Adrenal glands unremarkable. Stomach/Bowel: Indistinct margins of the descending and transverse duodenum along the infiltrative pancreatic mass. Oral contrast makes its way into the distal small bowel. Vascular/Lymphatic: Portacaval node 1.2 cm in short axis on image 45/5, formerly 0.9 cm. Aortoiliac atherosclerotic vascular disease. Reproductive: Uterus absent.  Adnexa unremarkable. Other: No supplemental non-categorized findings. Musculoskeletal: Posterolateral rod and pedicle screw fixation at L3-L4-L5 with interbody fusion. New 1.1 cm lucency along the superior endplate of W09 with surrounding sclerosis, probably from a Schmorl' s node, questionable superior endplate compression. This is unlikely to be due to a metastatic lesion given the connectivity to the T11-12 intervertebral disc. IMPRESSION: 1. Essentially stable size of the pancreatic mass, by my measurement about 7.3 by 4.4 cm, with abutment of the anterior abdominal aorta, SMA, portal vein, posterior margin of the SMV, and potentially the proper hepatic vein along the porta hepatis. A portacaval lymph node measures 1.2 cm in short axis, formerly 0.9 cm. 2. No focal metastatic lesion to the  liver is identified. 3. There is some increase in narrowing of the central portion of the expandable biliary stent although the stent still appears to be patent. 4. Stable 4 by  5 mm right lower lobe lung nodule. Stable left lower lobe densely calcified granuloma. 5. Other imaging findings of potential clinical significance: Mild cardiomegaly. Cholelithiasis. A Aortic Atherosclerosis (ICD10-I70.0). New broad Schmorl's node along the superior endplate of V85, questionable component of superior endplate compression given the surrounding sclerosis. Electronically Signed   By: Van Clines M.D.   On: 02/04/2017 16:21       IMPRESSION/PLAN: 1. Locally advanced adenocarcinoma of the pancrease. Dr. Lisbeth Renshaw discusses the pathology findings and reviews the nature of invasive pancreatic disease. Dr. Lisbeth Renshaw discusses the utility of radiotherapy with concurrent chemotherapy as her neoadjuvant treatment has not shown a resoponse. Although she's not currently a candidate for surgery, this will be considered if she had a dramatic response. We discussed the risks, benefits, short, and long term effects of radiotherapy, and the patient is interested in proceeding. Dr. Lisbeth Renshaw discusses the delivery and logistics of radiotherapy and would recommend a course of 5 1/2 weeks of radiotherapy and consideration of concurrent chemotherapy per Dr. Benay Spice. We will move forward with planning radiotherapy and simulation on Friday this week with IV contrast. Written consent is obtained and placed in the chart, a copy was provided to the patient.    The above documentation reflects my direct findings during this shared patient visit. Please see the separate note by Dr. Lisbeth Renshaw on this date for the remainder of the patient's plan of care.    Carola Rhine, PAC

## 2017-02-11 NOTE — Progress Notes (Signed)
Diagnosis 10/09/16:  FINE NEEDLE ASPIRATION, ENDOSCOPIC, PANCREAS UNCINATE (SPECIMEN 1 OF 1 COLLECTED 10/09/16): MALIGNANT CELLS CONSISTENT WITH ADENOCARCINOMA.  Pain right knee lower back 5/10 , surgery back fusion in February, compression fracture t-12   ASSESSMENT and THERAPY PLAN:   Pancreatic cancer [facility]) This is a 71 year old female with pancreatic cancer currently receiving chemotherapy with gemcitabine and Abraxane. She is scheduled for cycle 5 of her chemotherapy today.  The patient was seen with Dr. Benay Spice. Due to her right leg weakness, Dr. Benay Spice has spoken with Dr. Annette Stable who reviewed the MRI results. Verbal report was that the patient has a compression fracture at T12 likely from osteoporosis and stenosis above L1/L2. Was no evidence of cancer noted on the MRI.  Recommend the patient proceed with cycle 5 of chemotherapy today as scheduled. The patient will have a restaging CT of the abdomen and pelvis performed prior to her next cycle of chemotherapy.  For her right leg weakness we will refer her for home health physical therapy.Home health physical therapy done was for her back surgery stated by patient    BP 117/62 Comment: LFA sitting  Pulse 71   Temp 98.1 F (36.7 C) (Oral)   Resp 20   Ht 5\' 3"  (1.6 m)   Wt 167 lb 12.8 oz (76.1 kg)   SpO2 100%   BMI 29.72 kg/m   Wt Readings from Last 3 Encounters:  02/11/17 167 lb 12.8 oz (76.1 kg)  02/10/17 169 lb 12.8 oz (77 kg)  01/26/17 172 lb 6.4 oz (78.2 kg)

## 2017-02-12 ENCOUNTER — Telehealth: Payer: Self-pay | Admitting: *Deleted

## 2017-02-12 NOTE — Telephone Encounter (Signed)
called patient home, spoke with husband, informed them to come at 1pm tomorrow to register for IV start ,patient has a port a ctah, encouraged to apply emla cream to site before coming, spouse thanked this RN and will make sure that patient may start 02/16/17 per our PA Bryson Ha 8:35 AM

## 2017-02-13 ENCOUNTER — Ambulatory Visit
Admission: RE | Admit: 2017-02-13 | Discharge: 2017-02-13 | Disposition: A | Discharge status: Home / Self Care | Payer: Medicare Other | Source: Ambulatory Visit | Attending: Radiation Oncology | Admitting: Radiation Oncology

## 2017-02-13 DIAGNOSIS — Z79899 Other long term (current) drug therapy: Secondary | ICD-10-CM | POA: Diagnosis not present

## 2017-02-13 DIAGNOSIS — I129 Hypertensive chronic kidney disease with stage 1 through stage 4 chronic kidney disease, or unspecified chronic kidney disease: Secondary | ICD-10-CM | POA: Diagnosis not present

## 2017-02-13 DIAGNOSIS — G8929 Other chronic pain: Secondary | ICD-10-CM | POA: Diagnosis not present

## 2017-02-13 DIAGNOSIS — M797 Fibromyalgia: Secondary | ICD-10-CM | POA: Diagnosis not present

## 2017-02-13 DIAGNOSIS — Z794 Long term (current) use of insulin: Secondary | ICD-10-CM | POA: Diagnosis not present

## 2017-02-13 DIAGNOSIS — Z85828 Personal history of other malignant neoplasm of skin: Secondary | ICD-10-CM | POA: Diagnosis not present

## 2017-02-13 DIAGNOSIS — M179 Osteoarthritis of knee, unspecified: Secondary | ICD-10-CM | POA: Diagnosis not present

## 2017-02-13 DIAGNOSIS — N183 Chronic kidney disease, stage 3 (moderate): Secondary | ICD-10-CM | POA: Diagnosis not present

## 2017-02-13 DIAGNOSIS — M545 Low back pain: Secondary | ICD-10-CM | POA: Diagnosis not present

## 2017-02-13 DIAGNOSIS — Z510 Encounter for antineoplastic radiation therapy: Secondary | ICD-10-CM | POA: Diagnosis not present

## 2017-02-13 DIAGNOSIS — C4442 Squamous cell carcinoma of skin of scalp and neck: Secondary | ICD-10-CM

## 2017-02-13 DIAGNOSIS — C250 Malignant neoplasm of head of pancreas: Secondary | ICD-10-CM | POA: Diagnosis not present

## 2017-02-13 DIAGNOSIS — Z981 Arthrodesis status: Secondary | ICD-10-CM | POA: Diagnosis not present

## 2017-02-13 DIAGNOSIS — E1122 Type 2 diabetes mellitus with diabetic chronic kidney disease: Secondary | ICD-10-CM | POA: Diagnosis not present

## 2017-02-13 DIAGNOSIS — Z96652 Presence of left artificial knee joint: Secondary | ICD-10-CM | POA: Diagnosis not present

## 2017-02-13 MED ORDER — SODIUM CHLORIDE 0.9% FLUSH
10.0000 mL | Freq: Once | INTRAVENOUS | Status: AC
  Administered 2017-02-13: 10 mL via INTRAVENOUS

## 2017-02-13 MED ORDER — HEPARIN SOD (PORK) LOCK FLUSH 100 UNIT/ML IV SOLN
500.0000 [IU] | Freq: Once | INTRAVENOUS | Status: AC
  Administered 2017-02-13: 500 [IU] via INTRAVENOUS

## 2017-02-13 NOTE — Telephone Encounter (Signed)
Will you let her know that [facility] didn't know not to call her, and that until we saw her we didn't have definitive radiation plans which is why she had a call from them? Thanks, Bryson Ha

## 2017-02-13 NOTE — Progress Notes (Signed)
Has armband been applied?  Yes  Does patient have an allergy to IV contrast dye?: NO   Has patient ever received premedication for IV contrast dye?:NO  Does patient take metformin?:NO  If patient does take metformin when was the last dose: N/A  Date of lab work: 8/21/18BUN: 25.5 CR: 1.3 EFGR=41  IV site: Right power port accessed  X 1-20 g   1 inch catheter needle,excellent blood return,patient tolerated well Has IV site been added to flowsheet?  Yes

## 2017-02-16 ENCOUNTER — Telehealth: Payer: Self-pay | Admitting: Oncology

## 2017-02-16 NOTE — Telephone Encounter (Signed)
Spoke with patient regarding their upcoming appointment tomorrow that was added to see Lattie Haw.

## 2017-02-17 ENCOUNTER — Ambulatory Visit (HOSPITAL_BASED_OUTPATIENT_CLINIC_OR_DEPARTMENT_OTHER): Payer: Medicare Other

## 2017-02-17 ENCOUNTER — Ambulatory Visit (HOSPITAL_BASED_OUTPATIENT_CLINIC_OR_DEPARTMENT_OTHER): Payer: Medicare Other | Admitting: Nurse Practitioner

## 2017-02-17 ENCOUNTER — Telehealth: Payer: Self-pay | Admitting: Nurse Practitioner

## 2017-02-17 VITALS — BP 144/87 | HR 110 | Temp 98.5°F | Resp 18 | Ht 63.0 in | Wt 164.9 lb

## 2017-02-17 DIAGNOSIS — C258 Malignant neoplasm of overlapping sites of pancreas: Secondary | ICD-10-CM

## 2017-02-17 DIAGNOSIS — C259 Malignant neoplasm of pancreas, unspecified: Secondary | ICD-10-CM

## 2017-02-17 DIAGNOSIS — Z95828 Presence of other vascular implants and grafts: Secondary | ICD-10-CM

## 2017-02-17 LAB — CBC WITH DIFFERENTIAL/PLATELET
BASO%: 1.1 % (ref 0.0–2.0)
Basophils Absolute: 0.1 10*3/uL (ref 0.0–0.1)
EOS%: 0.8 % (ref 0.0–7.0)
Eosinophils Absolute: 0.1 10*3/uL (ref 0.0–0.5)
HCT: 30.9 % — ABNORMAL LOW (ref 34.8–46.6)
HEMOGLOBIN: 10.1 g/dL — AB (ref 11.6–15.9)
LYMPH%: 12.2 % — AB (ref 14.0–49.7)
MCH: 26.5 pg (ref 25.1–34.0)
MCHC: 32.6 g/dL (ref 31.5–36.0)
MCV: 81.4 fL (ref 79.5–101.0)
MONO#: 0.9 10*3/uL (ref 0.1–0.9)
MONO%: 8.4 % (ref 0.0–14.0)
NEUT%: 77.5 % — ABNORMAL HIGH (ref 38.4–76.8)
NEUTROS ABS: 8.2 10*3/uL — AB (ref 1.5–6.5)
PLATELETS: 527 10*3/uL — AB (ref 145–400)
RBC: 3.8 10*6/uL (ref 3.70–5.45)
RDW: 18.8 % — AB (ref 11.2–14.5)
WBC: 10.5 10*3/uL — AB (ref 3.9–10.3)
lymph#: 1.3 10*3/uL (ref 0.9–3.3)

## 2017-02-17 LAB — COMPREHENSIVE METABOLIC PANEL
ALBUMIN: 2.6 g/dL — AB (ref 3.5–5.0)
ALT: 21 U/L (ref 0–55)
AST: 35 U/L — ABNORMAL HIGH (ref 5–34)
Alkaline Phosphatase: 752 U/L — ABNORMAL HIGH (ref 40–150)
Anion Gap: 7 mEq/L (ref 3–11)
BILIRUBIN TOTAL: 0.4 mg/dL (ref 0.20–1.20)
BUN: 20.4 mg/dL (ref 7.0–26.0)
CO2: 30 meq/L — AB (ref 22–29)
CREATININE: 1.1 mg/dL (ref 0.6–1.1)
Calcium: 10.3 mg/dL (ref 8.4–10.4)
Chloride: 98 mEq/L (ref 98–109)
EGFR: 51 mL/min/{1.73_m2} — ABNORMAL LOW (ref >90)
GLUCOSE: 235 mg/dL — AB (ref 70–140)
Potassium: 3.9 mEq/L (ref 3.5–5.1)
SODIUM: 135 meq/L — AB (ref 136–145)
TOTAL PROTEIN: 7.3 g/dL (ref 6.4–8.3)

## 2017-02-17 MED ORDER — SODIUM CHLORIDE 0.9% FLUSH
10.0000 mL | Freq: Once | INTRAVENOUS | Status: AC
  Administered 2017-02-17: 10 mL
  Filled 2017-02-17: qty 10

## 2017-02-17 MED ORDER — HEPARIN SOD (PORK) LOCK FLUSH 100 UNIT/ML IV SOLN
500.0000 [IU] | Freq: Once | INTRAVENOUS | Status: AC
  Administered 2017-02-17: 500 [IU]
  Filled 2017-02-17: qty 5

## 2017-02-17 MED ORDER — ONDANSETRON HCL 8 MG PO TABS: 8.0000 mg | ORAL_TABLET | Freq: Three times a day (TID) | ORAL | 1 refills | Status: DC | PRN

## 2017-02-17 NOTE — Telephone Encounter (Signed)
Scheduled appt per 8/28 los - Gave patient AVS and calender per los.

## 2017-02-17 NOTE — Progress Notes (Addendum)
Patricia Austin OFFICE PROGRESS NOTE   Diagnosis:  Pancreas cancer  INTERVAL HISTORY:   Patricia Austin returns as scheduled. She continues to have back and right leg pain with associated weakness. She is able to ambulate with a walker. She has nausea typically in the morning hours. Bowels are moving.  Objective:  Vital signs in last 24 hours:  Blood pressure (!) 144/87, pulse (!) 110, temperature 98.5 F (36.9 C), temperature source Oral, resp. rate 18, height 5\' 3"  (1.6 m), weight 164 lb 14.4 oz (74.8 kg), SpO2 99 %.    HEENT: No thrush or ulcers.  Resp: Lungs clear bilaterally. Cardio: Regular rate and rhythm. GI: Abdomen soft and nontender. No hepatosplenomegaly. Vascular: No leg edema. Neuro: Right leg weakness.  Skin: Palms without erythema.  Port-A-Cath without erythema.  Lab Results:  Lab Results  Component Value Date   WBC 8.3 02/10/2017   HGB 8.5 (L) 02/10/2017   HCT 26.3 (L) 02/10/2017   MCV 81.7 02/10/2017   PLT 417 (H) 02/10/2017   NEUTROABS 5.9 02/10/2017    Imaging:  No results found.  Medications: I have reviewed the patient's current medications.  Assessment/Plan: 1. Adenocarcinoma the pancreas-pancreas head/uncinatemass noted on a CT abdomen/pelvis 09/20/2016, status post an EUS biopsy 10/09/2016 confirming adenocarcinoma  CT 09/20/2016-pancreas head/uncinatemass, possible contact with the superior mesenteric artery, biliary dilatation, small low lung nodules  CTs chest, abdomen, and pelvis 10/27/2016-enlarging pancreas head mass with vascular abutment, no evidence of metastatic disease  Cycle 1 gemcitabine/Abraxane 11/25/2016  Cycle 2 gemcitabine/Abraxane 12/08/2016  Cycle 3 gemcitabine/Abraxane 12/22/2016  Cycle 4 gemcitabine/Abraxane 01/13/2017  Cycle 5 gemcitabine/Abraxane 01/26/2017  CT 02/04/2017-stable pancreas mass with abutment of the aorta, superior mesenteric artery, portal vein, and superior Mr. brain. No evidence of  liver metastases. Stable right lower lobe nodule.  Radiation/Xeloda-anticipated start date 02/25/2017  2. Biliary obstruction secondary to #1, status post placement of a wall stent and external biliary drain 10/09/2016  Biliary drain removed 10/20/2016  3. Diabetes-admitted with marked hyperglycemia 10/27/2016  4. History of squamous cell carcinoma of the scalp  5. Lumbar fusion surgery February 2018  6. 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 History of Abdominal/back pain secondary to pancreas cancer  8. Cholangitis 10/07/2016  9. Confusionduring hospital admission 10/27/2016-etiology unclear, MRI brain negative for metastatic disease on 10/29/2016, improved  10. Port-A-Cath placement 11/19/2016  11. Admission 12/30/2016 with dyspnea, chest CT consistent with pulmonary edema  12.  Right leg weakness-MRI lumbar spine August 2018-no evidence of metastatic disease    Disposition: Patricia Austin appears unchanged. She has seen Dr. Lisbeth Renshaw. The plan is for radiation with concurrent Xeloda chemotherapy. We reviewed potential toxicities associated with Xeloda including nausea, mouth sores, diarrhea, hand-foot syndrome, rash, conjunctivitis, increased sensitivity to sun. She is agreeable to proceed. She is scheduled to begin the course of radiation 02/25/2017. She will take Xeloda on the days of radiation only. We will see her in follow-up on 03/02/2017. She will contact the office in the interim with any problems.    Ned Card ANP/GNP-BC   02/17/2017  2:43 PM   This was a shared visit with Ned Card. Dr. Lisbeth Renshaw recommends a standard course of radiation with concurrent Xeloda chemotherapy. We reviewed the potential toxicities associated with Xeloda. She agrees to proceed.  I discussed the back pain and right leg weakness with Dr. Cyndy Freeze. He reports she has compression fracture and may benefit from a kyphoplasty  procedure. He will contact  Patricia Austin to schedule this procedure. I think it is unlikely the pain is related to pancreas cancer, but this is possible.  25 minutes were spent with the patient today. The majority of the time was used for counseling and coordination of care.

## 2017-02-17 NOTE — Patient Instructions (Signed)
Implanted Port Home Guide An implanted port is a type of central line that is placed under the skin. Central lines are used to provide IV access when treatment or nutrition needs to be given through a person's veins. Implanted ports are used for long-term IV access. An implanted port may be placed because:  You need IV medicine that would be irritating to the small veins in your hands or arms.  You need long-term IV medicines, such as antibiotics.  You need IV nutrition for a long period.  You need frequent blood draws for lab tests.  You need dialysis.  Implanted ports are usually placed in the chest area, but they can also be placed in the upper arm, the abdomen, or the leg. An implanted port has two main parts:  Reservoir. The reservoir is round and will appear as a small, raised area under your skin. The reservoir is the part where a needle is inserted to give medicines or draw blood.  Catheter. The catheter is a thin, flexible tube that extends from the reservoir. The catheter is placed into a large vein. Medicine that is inserted into the reservoir goes into the catheter and then into the vein.  How will I care for my incision site? Do not get the incision site wet. Bathe or shower as directed by your health care provider. How is my port accessed? Special steps must be taken to access the port:  Before the port is accessed, a numbing cream can be placed on the skin. This helps numb the skin over the port site.  Your health care provider uses a sterile technique to access the port. ? Your health care provider must put on a mask and sterile gloves. ? The skin over your port is cleaned carefully with an antiseptic and allowed to dry. ? The port is gently pinched between sterile gloves, and a needle is inserted into the port.  Only "non-coring" port needles should be used to access the port. Once the port is accessed, a blood return should be checked. This helps ensure that the port  is in the vein and is not clogged.  If your port needs to remain accessed for a constant infusion, a clear (transparent) bandage will be placed over the needle site. The bandage and needle will need to be changed every week, or as directed by your health care provider.  Keep the bandage covering the needle clean and dry. Do not get it wet. Follow your health care provider's instructions on how to take a shower or bath while the port is accessed.  If your port does not need to stay accessed, no bandage is needed over the port.  What is flushing? Flushing helps keep the port from getting clogged. Follow your health care provider's instructions on how and when to flush the port. Ports are usually flushed with saline solution or a medicine called heparin. The need for flushing will depend on how the port is used.  If the port is used for intermittent medicines or blood draws, the port will need to be flushed: ? After medicines have been given. ? After blood has been drawn. ? As part of routine maintenance.  If a constant infusion is running, the port may not need to be flushed.  How long will my port stay implanted? The port can stay in for as long as your health care provider thinks it is needed. When it is time for the port to come out, surgery will be   done to remove it. The procedure is similar to the one performed when the port was put in. When should I seek immediate medical care? When you have an implanted port, you should seek immediate medical care if:  You notice a bad smell coming from the incision site.  You have swelling, redness, or drainage at the incision site.  You have more swelling or pain at the port site or the surrounding area.  You have a fever that is not controlled with medicine.  This information is not intended to replace advice given to you by your health care provider. Make sure you discuss any questions you have with your health care provider. Document  Released: 06/09/2005 Document Revised: 11/15/2015 Document Reviewed: 02/14/2013 Elsevier Interactive Patient Education  2017 Elsevier Inc.  

## 2017-02-19 DIAGNOSIS — M179 Osteoarthritis of knee, unspecified: Secondary | ICD-10-CM | POA: Diagnosis not present

## 2017-02-19 DIAGNOSIS — Z981 Arthrodesis status: Secondary | ICD-10-CM | POA: Diagnosis not present

## 2017-02-19 DIAGNOSIS — I129 Hypertensive chronic kidney disease with stage 1 through stage 4 chronic kidney disease, or unspecified chronic kidney disease: Secondary | ICD-10-CM | POA: Diagnosis not present

## 2017-02-19 DIAGNOSIS — Z794 Long term (current) use of insulin: Secondary | ICD-10-CM | POA: Diagnosis not present

## 2017-02-19 DIAGNOSIS — M545 Low back pain: Secondary | ICD-10-CM | POA: Diagnosis not present

## 2017-02-19 DIAGNOSIS — Z96652 Presence of left artificial knee joint: Secondary | ICD-10-CM | POA: Diagnosis not present

## 2017-02-19 DIAGNOSIS — M797 Fibromyalgia: Secondary | ICD-10-CM | POA: Diagnosis not present

## 2017-02-19 DIAGNOSIS — Z85828 Personal history of other malignant neoplasm of skin: Secondary | ICD-10-CM | POA: Diagnosis not present

## 2017-02-19 DIAGNOSIS — Z79899 Other long term (current) drug therapy: Secondary | ICD-10-CM | POA: Diagnosis not present

## 2017-02-19 DIAGNOSIS — G8929 Other chronic pain: Secondary | ICD-10-CM | POA: Diagnosis not present

## 2017-02-19 DIAGNOSIS — C250 Malignant neoplasm of head of pancreas: Secondary | ICD-10-CM | POA: Diagnosis not present

## 2017-02-19 DIAGNOSIS — E1122 Type 2 diabetes mellitus with diabetic chronic kidney disease: Secondary | ICD-10-CM | POA: Diagnosis not present

## 2017-02-19 DIAGNOSIS — Z510 Encounter for antineoplastic radiation therapy: Secondary | ICD-10-CM | POA: Diagnosis not present

## 2017-02-19 DIAGNOSIS — N183 Chronic kidney disease, stage 3 (moderate): Secondary | ICD-10-CM | POA: Diagnosis not present

## 2017-02-20 ENCOUNTER — Telehealth: Payer: Self-pay | Admitting: Pharmacist

## 2017-02-20 DIAGNOSIS — C259 Malignant neoplasm of pancreas, unspecified: Secondary | ICD-10-CM

## 2017-02-20 MED ORDER — CAPECITABINE 500 MG PO TABS: ORAL_TABLET | ORAL | 0 refills | Status: DC

## 2017-02-20 MED FILL — XELODA 500 MG TABLET: 500 | 28 days supply | Qty: 120 | Fill #0

## 2017-02-20 NOTE — Telephone Encounter (Signed)
Oral Oncology Pharmacist Encounter  Received new prescription for Xeloda for the treatment of pancreatic adenocarcinoma in conjunction with radiation, planned duration 5 1/2 weeks.  Labs from 02/17/17 assessed, OK for treatment. EKG 12/30/16 shows QTc WNL  Current medication list in Epic reviewed, no significant DDIs with Xeloda identified.  Prescription has been e-scribed to the [facility] for benefits analysis and approval. Prior authorization is not required Test claim at the pharmacy revealed copayment $0 for entire course of chemoradiation.  Prescription will be prepared today at the pharmacy. Patricia Austin states they will pick up prescription when they are in [place] on 9/5 for radiation start.   I spoke with Patricia Austin for overview of: Xeloda.   Counseled patient on administration, dosing, side effects, safe handling, and monitoring. Patient will take Xeloda 500mg  tablets, 3 tablets (1500mg ) by mouth in AM and 3 tabs (1500mg ) by mouth in PM, within 30 minutes of finishing meals, on days of radiation only. Xeloda and radiation start date: 02/25/17  Side effects include but not limited to: fatigue, decerased blood counts, GI upset, diarrhea, and hand-foot syndrome. Patient has loperamide at home and will call the office if diarrhea develops.    Reviewed with patient importance of keeping a medication schedule and plan for any missed doses.  Patricia Austin voiced understanding and appreciation.   All questions answered.  Patient knows to call the office with questions or concerns. Oral Oncology Clinic will continue to follow.  Thank you,  Johny Drilling, PharmD, BCPS, BCOP 02/20/2017  9:29 AM Oral Oncology Clinic [phone]

## 2017-02-25 ENCOUNTER — Other Ambulatory Visit: Payer: Self-pay | Admitting: Neurosurgery

## 2017-02-25 ENCOUNTER — Telehealth: Payer: Self-pay

## 2017-02-25 ENCOUNTER — Ambulatory Visit
Admission: RE | Admit: 2017-02-25 | Discharge: 2017-02-25 | Disposition: A | Discharge status: Home / Self Care | Payer: Medicare Other | Source: Ambulatory Visit | Attending: Radiation Oncology | Admitting: Radiation Oncology

## 2017-02-25 DIAGNOSIS — N183 Chronic kidney disease, stage 3 (moderate): Secondary | ICD-10-CM | POA: Diagnosis not present

## 2017-02-25 DIAGNOSIS — E1122 Type 2 diabetes mellitus with diabetic chronic kidney disease: Secondary | ICD-10-CM | POA: Diagnosis not present

## 2017-02-25 DIAGNOSIS — M179 Osteoarthritis of knee, unspecified: Secondary | ICD-10-CM | POA: Diagnosis not present

## 2017-02-25 DIAGNOSIS — Z79899 Other long term (current) drug therapy: Secondary | ICD-10-CM | POA: Diagnosis not present

## 2017-02-25 DIAGNOSIS — C250 Malignant neoplasm of head of pancreas: Secondary | ICD-10-CM | POA: Diagnosis not present

## 2017-02-25 DIAGNOSIS — Z794 Long term (current) use of insulin: Secondary | ICD-10-CM | POA: Diagnosis not present

## 2017-02-25 DIAGNOSIS — G8929 Other chronic pain: Secondary | ICD-10-CM | POA: Diagnosis not present

## 2017-02-25 DIAGNOSIS — Z510 Encounter for antineoplastic radiation therapy: Secondary | ICD-10-CM | POA: Diagnosis not present

## 2017-02-25 DIAGNOSIS — Z85828 Personal history of other malignant neoplasm of skin: Secondary | ICD-10-CM | POA: Diagnosis not present

## 2017-02-25 DIAGNOSIS — I129 Hypertensive chronic kidney disease with stage 1 through stage 4 chronic kidney disease, or unspecified chronic kidney disease: Secondary | ICD-10-CM | POA: Diagnosis not present

## 2017-02-25 DIAGNOSIS — Z981 Arthrodesis status: Secondary | ICD-10-CM | POA: Diagnosis not present

## 2017-02-25 DIAGNOSIS — M545 Low back pain: Secondary | ICD-10-CM | POA: Diagnosis not present

## 2017-02-25 DIAGNOSIS — M797 Fibromyalgia: Secondary | ICD-10-CM | POA: Diagnosis not present

## 2017-02-25 DIAGNOSIS — Z96652 Presence of left artificial knee joint: Secondary | ICD-10-CM | POA: Diagnosis not present

## 2017-02-25 NOTE — Telephone Encounter (Signed)
Nutrition  Patient identified on Malnutrition Screening Report for weight loss  Called and left message for patient to return call.  Katrine Radich B. Zenia Resides, [place], [place] Registered Dietitian [phone] (pager)

## 2017-02-26 ENCOUNTER — Ambulatory Visit
Admission: RE | Admit: 2017-02-26 | Discharge: 2017-02-26 | Disposition: A | Discharge status: Home / Self Care | Payer: Medicare Other | Source: Ambulatory Visit | Attending: Radiation Oncology | Admitting: Radiation Oncology

## 2017-02-26 DIAGNOSIS — Z981 Arthrodesis status: Secondary | ICD-10-CM | POA: Diagnosis not present

## 2017-02-26 DIAGNOSIS — C250 Malignant neoplasm of head of pancreas: Secondary | ICD-10-CM | POA: Diagnosis not present

## 2017-02-26 DIAGNOSIS — Z96652 Presence of left artificial knee joint: Secondary | ICD-10-CM | POA: Diagnosis not present

## 2017-02-26 DIAGNOSIS — N183 Chronic kidney disease, stage 3 (moderate): Secondary | ICD-10-CM | POA: Diagnosis not present

## 2017-02-26 DIAGNOSIS — Z510 Encounter for antineoplastic radiation therapy: Secondary | ICD-10-CM | POA: Diagnosis not present

## 2017-02-26 DIAGNOSIS — Z79899 Other long term (current) drug therapy: Secondary | ICD-10-CM | POA: Diagnosis not present

## 2017-02-26 DIAGNOSIS — E1122 Type 2 diabetes mellitus with diabetic chronic kidney disease: Secondary | ICD-10-CM | POA: Diagnosis not present

## 2017-02-26 DIAGNOSIS — Z85828 Personal history of other malignant neoplasm of skin: Secondary | ICD-10-CM | POA: Diagnosis not present

## 2017-02-26 DIAGNOSIS — M179 Osteoarthritis of knee, unspecified: Secondary | ICD-10-CM | POA: Diagnosis not present

## 2017-02-26 DIAGNOSIS — M545 Low back pain: Secondary | ICD-10-CM | POA: Diagnosis not present

## 2017-02-26 DIAGNOSIS — I129 Hypertensive chronic kidney disease with stage 1 through stage 4 chronic kidney disease, or unspecified chronic kidney disease: Secondary | ICD-10-CM | POA: Diagnosis not present

## 2017-02-26 DIAGNOSIS — G8929 Other chronic pain: Secondary | ICD-10-CM | POA: Diagnosis not present

## 2017-02-26 DIAGNOSIS — Z794 Long term (current) use of insulin: Secondary | ICD-10-CM | POA: Diagnosis not present

## 2017-02-26 DIAGNOSIS — M797 Fibromyalgia: Secondary | ICD-10-CM | POA: Diagnosis not present

## 2017-02-27 ENCOUNTER — Ambulatory Visit
Admission: RE | Admit: 2017-02-27 | Discharge: 2017-02-27 | Disposition: A | Discharge status: Home / Self Care | Payer: Medicare Other | Source: Ambulatory Visit | Attending: Radiation Oncology | Admitting: Radiation Oncology

## 2017-02-27 DIAGNOSIS — C250 Malignant neoplasm of head of pancreas: Secondary | ICD-10-CM | POA: Diagnosis not present

## 2017-02-27 DIAGNOSIS — I129 Hypertensive chronic kidney disease with stage 1 through stage 4 chronic kidney disease, or unspecified chronic kidney disease: Secondary | ICD-10-CM | POA: Diagnosis not present

## 2017-02-27 DIAGNOSIS — M545 Low back pain: Secondary | ICD-10-CM | POA: Diagnosis not present

## 2017-02-27 DIAGNOSIS — N183 Chronic kidney disease, stage 3 (moderate): Secondary | ICD-10-CM | POA: Diagnosis not present

## 2017-02-27 DIAGNOSIS — M797 Fibromyalgia: Secondary | ICD-10-CM | POA: Diagnosis not present

## 2017-02-27 DIAGNOSIS — Z79899 Other long term (current) drug therapy: Secondary | ICD-10-CM | POA: Diagnosis not present

## 2017-02-27 DIAGNOSIS — M179 Osteoarthritis of knee, unspecified: Secondary | ICD-10-CM | POA: Diagnosis not present

## 2017-02-27 DIAGNOSIS — C259 Malignant neoplasm of pancreas, unspecified: Secondary | ICD-10-CM

## 2017-02-27 DIAGNOSIS — Z96652 Presence of left artificial knee joint: Secondary | ICD-10-CM | POA: Diagnosis not present

## 2017-02-27 DIAGNOSIS — Z85828 Personal history of other malignant neoplasm of skin: Secondary | ICD-10-CM | POA: Diagnosis not present

## 2017-02-27 DIAGNOSIS — E1122 Type 2 diabetes mellitus with diabetic chronic kidney disease: Secondary | ICD-10-CM | POA: Diagnosis not present

## 2017-02-27 DIAGNOSIS — Z510 Encounter for antineoplastic radiation therapy: Secondary | ICD-10-CM | POA: Diagnosis not present

## 2017-02-27 DIAGNOSIS — G8929 Other chronic pain: Secondary | ICD-10-CM | POA: Diagnosis not present

## 2017-02-27 DIAGNOSIS — Z794 Long term (current) use of insulin: Secondary | ICD-10-CM | POA: Diagnosis not present

## 2017-02-27 DIAGNOSIS — Z981 Arthrodesis status: Secondary | ICD-10-CM | POA: Diagnosis not present

## 2017-02-27 MED ORDER — RADIAPLEXRX EX GEL
Freq: Once | CUTANEOUS | Status: AC
  Administered 2017-02-27: 15:00:00 via TOPICAL

## 2017-02-27 NOTE — Progress Notes (Addendum)
  Radiation Oncology         [phone] ________________________________  Name: Patricia Austin MRN: [id number]  Date: 02/13/2017  DOB: Jul 17, 1945  SIMULATION AND TREATMENT PLANNING NOTE  DIAGNOSIS:     ICD-10-CM   1. 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  Identity was confirmed.  All relevant records and images related to the planned course of therapy were reviewed.   Written consent to proceed with treatment was confirmed which was freely given after reviewing the details related to the planned course of therapy had been reviewed with the patient.  Then, the patient was set-up in a stable reproducible  supine position for radiation therapy.  CT images were obtained.  Surface markings were placed.    Medically necessary complex treatment device(s) for immobilization:  Customized vac lock bag.   The CT images were loaded into the planning software.  Then the target and avoidance structures were contoured.  Treatment planning then occurred.  The radiation prescription was entered and confirmed.   I have requested : Intensity Modulated Radiotherapy (IMRT) is medically necessary for this case for the following reason:  Critical sparing of normal structures in nearby regions including the left and right kidney, spinal cord, and liver  The patient will undergo daily image guidance to ensure accurate localization of the target, and adequate minimize dose to the normal surrounding structures in close proximity to the target.   PLAN:  The patient will receive 45 Gy in 25 fractions initially. It is anticipated that the patient will then receive a 5.4 gray boost to yield a final dose of 50.4 gray.    Special treatment procedure The patient will also receive concurrent chemotherapy during the treatment. The patient may therefore experience increased toxicity or side effects and the patient will  be monitored for such problems. This may require extra lab work as necessary. This therefore constitutes a special treatment procedure.   ________________________________   Jodelle Gross, MD, PhD

## 2017-02-27 NOTE — Addendum Note (Signed)
Encounter addended by: Kyung Rudd, MD on: 02/27/2017  4:57 PM<BR>    Actions taken: Sign clinical note

## 2017-02-27 NOTE — Progress Notes (Signed)
Pt here for patient teaching.  Pt given Radiation and You booklet, skin care instructions, Alra deodorant and Radiaplex gel.  Reviewed areas of pertinence such as diarrhea, fatigue, nausea and vomiting and skin changes . Pt able to give teach back of to pat skin, use unscented/gentle soap, have Imodium on hand and drink plenty of water,apply Radiaplex bid and avoid applying anything to skin within 4 hours of treatment. Pt verbalizes understanding of information given and will contact nursing with any questions or concerns.     [email or web address]

## 2017-03-02 ENCOUNTER — Ambulatory Visit (HOSPITAL_BASED_OUTPATIENT_CLINIC_OR_DEPARTMENT_OTHER): Payer: Medicare Other | Admitting: Nurse Practitioner

## 2017-03-02 ENCOUNTER — Other Ambulatory Visit (HOSPITAL_BASED_OUTPATIENT_CLINIC_OR_DEPARTMENT_OTHER): Payer: Medicare Other

## 2017-03-02 ENCOUNTER — Ambulatory Visit
Admission: RE | Admit: 2017-03-02 | Discharge: 2017-03-02 | Disposition: A | Discharge status: Home / Self Care | Payer: Medicare Other | Source: Ambulatory Visit | Attending: Radiation Oncology | Admitting: Radiation Oncology

## 2017-03-02 VITALS — BP 183/66 | HR 83 | Temp 98.2°F | Resp 17 | Ht 63.0 in

## 2017-03-02 DIAGNOSIS — Z981 Arthrodesis status: Secondary | ICD-10-CM | POA: Diagnosis not present

## 2017-03-02 DIAGNOSIS — Z85828 Personal history of other malignant neoplasm of skin: Secondary | ICD-10-CM | POA: Diagnosis not present

## 2017-03-02 DIAGNOSIS — Z96652 Presence of left artificial knee joint: Secondary | ICD-10-CM | POA: Diagnosis not present

## 2017-03-02 DIAGNOSIS — C259 Malignant neoplasm of pancreas, unspecified: Secondary | ICD-10-CM

## 2017-03-02 DIAGNOSIS — Z79899 Other long term (current) drug therapy: Secondary | ICD-10-CM | POA: Diagnosis not present

## 2017-03-02 DIAGNOSIS — R531 Weakness: Secondary | ICD-10-CM

## 2017-03-02 DIAGNOSIS — C250 Malignant neoplasm of head of pancreas: Secondary | ICD-10-CM

## 2017-03-02 DIAGNOSIS — Z510 Encounter for antineoplastic radiation therapy: Secondary | ICD-10-CM | POA: Diagnosis not present

## 2017-03-02 DIAGNOSIS — M545 Low back pain: Secondary | ICD-10-CM | POA: Diagnosis not present

## 2017-03-02 DIAGNOSIS — I129 Hypertensive chronic kidney disease with stage 1 through stage 4 chronic kidney disease, or unspecified chronic kidney disease: Secondary | ICD-10-CM | POA: Diagnosis not present

## 2017-03-02 DIAGNOSIS — E1122 Type 2 diabetes mellitus with diabetic chronic kidney disease: Secondary | ICD-10-CM | POA: Diagnosis not present

## 2017-03-02 DIAGNOSIS — Z794 Long term (current) use of insulin: Secondary | ICD-10-CM | POA: Diagnosis not present

## 2017-03-02 DIAGNOSIS — N183 Chronic kidney disease, stage 3 (moderate): Secondary | ICD-10-CM | POA: Diagnosis not present

## 2017-03-02 DIAGNOSIS — M797 Fibromyalgia: Secondary | ICD-10-CM | POA: Diagnosis not present

## 2017-03-02 DIAGNOSIS — M179 Osteoarthritis of knee, unspecified: Secondary | ICD-10-CM | POA: Diagnosis not present

## 2017-03-02 DIAGNOSIS — G8929 Other chronic pain: Secondary | ICD-10-CM | POA: Diagnosis not present

## 2017-03-02 LAB — COMPREHENSIVE METABOLIC PANEL
ALBUMIN: 2.6 g/dL — AB (ref 3.5–5.0)
ALK PHOS: 811 U/L — AB (ref 40–150)
ALT: 62 U/L — ABNORMAL HIGH (ref 0–55)
ANION GAP: 9 meq/L (ref 3–11)
AST: 64 U/L — ABNORMAL HIGH (ref 5–34)
BILIRUBIN TOTAL: 0.72 mg/dL (ref 0.20–1.20)
BUN: 16.4 mg/dL (ref 7.0–26.0)
CO2: 27 mEq/L (ref 22–29)
Calcium: 10.3 mg/dL (ref 8.4–10.4)
Chloride: 99 mEq/L (ref 98–109)
Creatinine: 1.1 mg/dL (ref 0.6–1.1)
EGFR: 48 mL/min/{1.73_m2} — AB (ref >90)
GLUCOSE: 256 mg/dL — AB (ref 70–140)
POTASSIUM: 3.8 meq/L (ref 3.5–5.1)
SODIUM: 135 meq/L — AB (ref 136–145)
TOTAL PROTEIN: 7.1 g/dL (ref 6.4–8.3)

## 2017-03-02 LAB — CBC WITH DIFFERENTIAL/PLATELET
BASO%: 0.2 % (ref 0.0–2.0)
BASOS ABS: 0 10*3/uL (ref 0.0–0.1)
EOS ABS: 0 10*3/uL (ref 0.0–0.5)
EOS%: 0.4 % (ref 0.0–7.0)
HCT: 31 % — ABNORMAL LOW (ref 34.8–46.6)
HEMOGLOBIN: 9.7 g/dL — AB (ref 11.6–15.9)
LYMPH%: 10.1 % — AB (ref 14.0–49.7)
MCH: 26.2 pg (ref 25.1–34.0)
MCHC: 31.3 g/dL — ABNORMAL LOW (ref 31.5–36.0)
MCV: 83.8 fL (ref 79.5–101.0)
MONO#: 0.6 10*3/uL (ref 0.1–0.9)
MONO%: 5.8 % (ref 0.0–14.0)
NEUT%: 83.5 % — ABNORMAL HIGH (ref 38.4–76.8)
NEUTROS ABS: 8.1 10*3/uL — AB (ref 1.5–6.5)
PLATELETS: 356 10*3/uL (ref 145–400)
RBC: 3.7 10*6/uL (ref 3.70–5.45)
RDW: 17.9 % — ABNORMAL HIGH (ref 11.2–14.5)
WBC: 9.7 10*3/uL (ref 3.9–10.3)
lymph#: 1 10*3/uL (ref 0.9–3.3)

## 2017-03-02 MED ORDER — SODIUM CHLORIDE 0.9% FLUSH
10.0000 mL | Freq: Once | INTRAVENOUS | Status: AC
  Administered 2017-03-02: 10 mL via INTRAVENOUS
  Filled 2017-03-02: qty 10

## 2017-03-02 NOTE — Progress Notes (Addendum)
  [facility] OFFICE PROGRESS NOTE   Diagnosis:  Pancreas cancer  INTERVAL HISTORY:   Patricia Austin returns as scheduled. She continues to have back and right leg pain. She reports a kyphoplasty procedure has been scheduled later this month. No change in baseline nausea. Appetite varies. No mouth sores. She had diarrhea after taking a "natural laxative". No hand or foot pain or redness.  Objective:  Vital signs in last 24 hours:  Blood pressure (!) 183/66, pulse 83, temperature 98.2 F (36.8 C), temperature source Oral, resp. rate 17, height 5\' 3"  (1.6 m), SpO2 100 %.    HEENT: No thrush or ulcers. Resp: Lungs clear bilaterally. Cardio: Regular rate and rhythm. GI: Abdomen soft and nontender. Vascular: No leg edema. Skin: Palms without erythema. Port-A-Cath without erythema.    Lab Results:  Lab Results  Component Value Date   WBC 10.5 (H) 02/17/2017   HGB 10.1 (L) 02/17/2017   HCT 30.9 (L) 02/17/2017   MCV 81.4 02/17/2017   PLT 527 (H) 02/17/2017   NEUTROABS 8.2 (H) 02/17/2017    Imaging:  No results found.  Medications: I have reviewed the patient's current medications.  Assessment/Plan: 1. Adenocarcinoma the pancreas-pancreas head/uncinatemass noted on a CT abdomen/pelvis 09/20/2016, status post an EUS biopsy 10/09/2016 confirming adenocarcinoma  CT 09/20/2016-pancreas head/uncinatemass, possible contact with the superior mesenteric artery, biliary dilatation, small low lung nodules  CTs chest, abdomen, and pelvis 10/27/2016-enlarging pancreas head mass with vascular abutment, no evidence of metastatic disease  Cycle 1 gemcitabine/Abraxane 11/25/2016  Cycle 2 gemcitabine/Abraxane 12/08/2016  Cycle 3 gemcitabine/Abraxane 12/22/2016  Cycle 4 gemcitabine/Abraxane 01/13/2017  Cycle 5 gemcitabine/Abraxane 01/26/2017  CT 02/04/2017-stable pancreas mass with abutment of the aorta, superior mesenteric artery, portal vein, and superior Mr. brain. No  evidence of liver metastases. Stable right lower lobe nodule.  Radiation/Xeloda-beginning 02/25/2017  2. Biliary obstruction secondary to #1, status post placement of a wall stent and external biliary drain 10/09/2016  Biliary drain removed 10/20/2016  3. Diabetes-admitted with marked hyperglycemia 10/27/2016  4. History of squamous cell carcinoma of the scalp  5. Lumbar fusion surgery February 2018  6. 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 History of Abdominal/back pain secondary to pancreas cancer  8. Cholangitis 10/07/2016  9. Confusionduring hospital admission 10/27/2016-etiology unclear, MRI brain negative for metastatic disease on 10/29/2016, improved  10. Port-A-Cath placement 11/19/2016  11. Admission 12/30/2016 with dyspnea, chest CT consistent with pulmonary edema  12. Right leg weakness-MRI lumbar spine August 2018-no evidence of metastatic disease; superior endplate fracture at O67. Loss of height 10%. No retropulsed bone. Consistent with a benign osteoporotic fracture.     Disposition: Ms. Torpey appears unchanged. She continues radiation/Xeloda. She will return for labs and a follow-up visit in 2 weeks.  She will follow-up with Dr. Cyndy Freeze regarding the back and right leg pain/weakness.   Plan reviewed with Dr. Benay Spice.  Ned Card ANP/GNP-BC   03/02/2017  1:01 PM

## 2017-03-03 ENCOUNTER — Ambulatory Visit
Admission: RE | Admit: 2017-03-03 | Discharge: 2017-03-03 | Disposition: A | Discharge status: Home / Self Care | Payer: Medicare Other | Source: Ambulatory Visit | Attending: Radiation Oncology | Admitting: Radiation Oncology

## 2017-03-03 ENCOUNTER — Telehealth: Payer: Self-pay | Admitting: Oncology

## 2017-03-03 ENCOUNTER — Encounter: Payer: Self-pay | Admitting: Radiation Oncology

## 2017-03-03 ENCOUNTER — Other Ambulatory Visit: Payer: Self-pay | Admitting: Nurse Practitioner

## 2017-03-03 DIAGNOSIS — N183 Chronic kidney disease, stage 3 (moderate): Secondary | ICD-10-CM | POA: Diagnosis not present

## 2017-03-03 DIAGNOSIS — G8929 Other chronic pain: Secondary | ICD-10-CM | POA: Diagnosis not present

## 2017-03-03 DIAGNOSIS — Z96652 Presence of left artificial knee joint: Secondary | ICD-10-CM | POA: Diagnosis not present

## 2017-03-03 DIAGNOSIS — Z85828 Personal history of other malignant neoplasm of skin: Secondary | ICD-10-CM | POA: Diagnosis not present

## 2017-03-03 DIAGNOSIS — M179 Osteoarthritis of knee, unspecified: Secondary | ICD-10-CM | POA: Diagnosis not present

## 2017-03-03 DIAGNOSIS — Z510 Encounter for antineoplastic radiation therapy: Secondary | ICD-10-CM | POA: Diagnosis not present

## 2017-03-03 DIAGNOSIS — C250 Malignant neoplasm of head of pancreas: Secondary | ICD-10-CM | POA: Diagnosis not present

## 2017-03-03 DIAGNOSIS — Z981 Arthrodesis status: Secondary | ICD-10-CM | POA: Diagnosis not present

## 2017-03-03 DIAGNOSIS — Z79899 Other long term (current) drug therapy: Secondary | ICD-10-CM | POA: Diagnosis not present

## 2017-03-03 DIAGNOSIS — Z794 Long term (current) use of insulin: Secondary | ICD-10-CM | POA: Diagnosis not present

## 2017-03-03 DIAGNOSIS — I129 Hypertensive chronic kidney disease with stage 1 through stage 4 chronic kidney disease, or unspecified chronic kidney disease: Secondary | ICD-10-CM | POA: Diagnosis not present

## 2017-03-03 DIAGNOSIS — M797 Fibromyalgia: Secondary | ICD-10-CM | POA: Diagnosis not present

## 2017-03-03 DIAGNOSIS — M545 Low back pain: Secondary | ICD-10-CM | POA: Diagnosis not present

## 2017-03-03 DIAGNOSIS — E1122 Type 2 diabetes mellitus with diabetic chronic kidney disease: Secondary | ICD-10-CM | POA: Diagnosis not present

## 2017-03-03 NOTE — Progress Notes (Signed)
Patient canme to nursing c/o abdominal pain, this morning, but none at present, yesterday had chills, vitals wnl, oxygen 100% room air, no nausea, had 2 bowel movements yesterday and ate some difernt foods not normal for her and had diarrhea yesterday eveneing, asked to be seen, Shona Simpson, PA-C, given assesment 3:13 PM

## 2017-03-03 NOTE — Progress Notes (Signed)
Ms. Jimerson came around the clinic today due to persistent nausea, and somewhat postprandial pain, though she reports she continues to have pain throughout the day in the epigastric region , radiation bilaterally to the spine. She denies any skin changes or ecchymoses. She had chills yesterday but no documented fevers. She reports that she continues to take Oxycodone 10 mg q 6 hours, but has noted that she has to take this more frequently, every 4-5 hours. She also continues to have constipation and has been using miralax regularly. She denies any trouble with passing gas, or actual emesis. She has a benign abdominal exam and normal vital signs. We discussed that I would contact Dr. Ardis Hughs. She will increase her OxyIR frequency to q 4-6 hours, try to focus on clear liquid intake. She will schedule her Zofran TID, and use Ativan q 4-6 hours prn nausea for breakthrough. Her abdominal exam revealed discomfort in the epigastric region without peritoneal signs or focal findings. She does not appear to be toxic. I will follow up with her once speaking with Dr. Ardis Hughs. Also of note her daily set up films are stable, but there has been a question as to whether or not her stent is in place. Her WBC count has also fluctuated mildly in the 8-10 range without fevers.

## 2017-03-03 NOTE — Telephone Encounter (Signed)
sw pt husband to confirm 9/17 at 1 pm per sch msg

## 2017-03-04 ENCOUNTER — Telehealth: Payer: Self-pay | Admitting: Oncology

## 2017-03-04 ENCOUNTER — Ambulatory Visit
Admission: RE | Admit: 2017-03-04 | Discharge: 2017-03-04 | Disposition: A | Discharge status: Home / Self Care | Payer: Medicare Other | Source: Ambulatory Visit | Attending: Radiation Oncology | Admitting: Radiation Oncology

## 2017-03-04 DIAGNOSIS — E1122 Type 2 diabetes mellitus with diabetic chronic kidney disease: Secondary | ICD-10-CM | POA: Diagnosis not present

## 2017-03-04 DIAGNOSIS — Z79899 Other long term (current) drug therapy: Secondary | ICD-10-CM | POA: Diagnosis not present

## 2017-03-04 DIAGNOSIS — M797 Fibromyalgia: Secondary | ICD-10-CM | POA: Diagnosis not present

## 2017-03-04 DIAGNOSIS — Z96652 Presence of left artificial knee joint: Secondary | ICD-10-CM | POA: Diagnosis not present

## 2017-03-04 DIAGNOSIS — Z981 Arthrodesis status: Secondary | ICD-10-CM | POA: Diagnosis not present

## 2017-03-04 DIAGNOSIS — C250 Malignant neoplasm of head of pancreas: Secondary | ICD-10-CM | POA: Diagnosis not present

## 2017-03-04 DIAGNOSIS — G8929 Other chronic pain: Secondary | ICD-10-CM | POA: Diagnosis not present

## 2017-03-04 DIAGNOSIS — M179 Osteoarthritis of knee, unspecified: Secondary | ICD-10-CM | POA: Diagnosis not present

## 2017-03-04 DIAGNOSIS — Z510 Encounter for antineoplastic radiation therapy: Secondary | ICD-10-CM | POA: Diagnosis not present

## 2017-03-04 DIAGNOSIS — N183 Chronic kidney disease, stage 3 (moderate): Secondary | ICD-10-CM | POA: Diagnosis not present

## 2017-03-04 DIAGNOSIS — M545 Low back pain: Secondary | ICD-10-CM | POA: Diagnosis not present

## 2017-03-04 DIAGNOSIS — Z794 Long term (current) use of insulin: Secondary | ICD-10-CM | POA: Diagnosis not present

## 2017-03-04 DIAGNOSIS — Z85828 Personal history of other malignant neoplasm of skin: Secondary | ICD-10-CM | POA: Diagnosis not present

## 2017-03-04 DIAGNOSIS — I129 Hypertensive chronic kidney disease with stage 1 through stage 4 chronic kidney disease, or unspecified chronic kidney disease: Secondary | ICD-10-CM | POA: Diagnosis not present

## 2017-03-04 NOTE — Telephone Encounter (Signed)
Scheduled appts per 9/10 los - patient is aware of appts.

## 2017-03-05 ENCOUNTER — Encounter: Payer: Self-pay | Admitting: Radiation Oncology

## 2017-03-05 ENCOUNTER — Ambulatory Visit
Admission: RE | Admit: 2017-03-05 | Discharge: 2017-03-05 | Disposition: A | Discharge status: Home / Self Care | Payer: Medicare Other | Source: Ambulatory Visit | Attending: Radiation Oncology | Admitting: Radiation Oncology

## 2017-03-05 ENCOUNTER — Telehealth: Payer: Self-pay | Admitting: Radiation Oncology

## 2017-03-05 DIAGNOSIS — C250 Malignant neoplasm of head of pancreas: Secondary | ICD-10-CM

## 2017-03-05 DIAGNOSIS — Z96652 Presence of left artificial knee joint: Secondary | ICD-10-CM | POA: Diagnosis not present

## 2017-03-05 DIAGNOSIS — Z510 Encounter for antineoplastic radiation therapy: Secondary | ICD-10-CM | POA: Diagnosis not present

## 2017-03-05 DIAGNOSIS — M179 Osteoarthritis of knee, unspecified: Secondary | ICD-10-CM | POA: Diagnosis not present

## 2017-03-05 DIAGNOSIS — M545 Low back pain: Secondary | ICD-10-CM | POA: Diagnosis not present

## 2017-03-05 DIAGNOSIS — N183 Chronic kidney disease, stage 3 (moderate): Secondary | ICD-10-CM | POA: Diagnosis not present

## 2017-03-05 DIAGNOSIS — Z794 Long term (current) use of insulin: Secondary | ICD-10-CM | POA: Diagnosis not present

## 2017-03-05 DIAGNOSIS — M797 Fibromyalgia: Secondary | ICD-10-CM | POA: Diagnosis not present

## 2017-03-05 DIAGNOSIS — G8929 Other chronic pain: Secondary | ICD-10-CM | POA: Diagnosis not present

## 2017-03-05 DIAGNOSIS — Z981 Arthrodesis status: Secondary | ICD-10-CM | POA: Diagnosis not present

## 2017-03-05 DIAGNOSIS — E1122 Type 2 diabetes mellitus with diabetic chronic kidney disease: Secondary | ICD-10-CM | POA: Diagnosis not present

## 2017-03-05 DIAGNOSIS — Z85828 Personal history of other malignant neoplasm of skin: Secondary | ICD-10-CM | POA: Diagnosis not present

## 2017-03-05 DIAGNOSIS — Z79899 Other long term (current) drug therapy: Secondary | ICD-10-CM | POA: Diagnosis not present

## 2017-03-05 DIAGNOSIS — I129 Hypertensive chronic kidney disease with stage 1 through stage 4 chronic kidney disease, or unspecified chronic kidney disease: Secondary | ICD-10-CM | POA: Diagnosis not present

## 2017-03-05 MED ORDER — OXYCODONE HCL 10 MG PO TABS: 10.0000 mg | ORAL_TABLET | ORAL | 0 refills | Status: AC | PRN

## 2017-03-05 NOTE — Telephone Encounter (Signed)
I spoke with the patient's husband. He stated that she's feeling better with scheduled zofran and ativan. She needs another prescription for oxycodone and we will print this and leave it for her at the [facility]. I encouraged him to also call Dr. Ardis Hughs' office as well for follow up.

## 2017-03-06 ENCOUNTER — Telehealth: Payer: Self-pay | Admitting: *Deleted

## 2017-03-06 ENCOUNTER — Ambulatory Visit
Admission: RE | Admit: 2017-03-06 | Discharge: 2017-03-06 | Disposition: A | Discharge status: Home / Self Care | Payer: Medicare Other | Source: Ambulatory Visit | Attending: Radiation Oncology | Admitting: Radiation Oncology

## 2017-03-06 DIAGNOSIS — E1122 Type 2 diabetes mellitus with diabetic chronic kidney disease: Secondary | ICD-10-CM | POA: Diagnosis not present

## 2017-03-06 DIAGNOSIS — Z9221 Personal history of antineoplastic chemotherapy: Secondary | ICD-10-CM | POA: Diagnosis not present

## 2017-03-06 DIAGNOSIS — F329 Major depressive disorder, single episode, unspecified: Secondary | ICD-10-CM | POA: Diagnosis not present

## 2017-03-06 DIAGNOSIS — Z96652 Presence of left artificial knee joint: Secondary | ICD-10-CM | POA: Diagnosis not present

## 2017-03-06 DIAGNOSIS — Z981 Arthrodesis status: Secondary | ICD-10-CM | POA: Diagnosis not present

## 2017-03-06 DIAGNOSIS — G8929 Other chronic pain: Secondary | ICD-10-CM | POA: Diagnosis not present

## 2017-03-06 DIAGNOSIS — N183 Chronic kidney disease, stage 3 (moderate): Secondary | ICD-10-CM | POA: Diagnosis not present

## 2017-03-06 DIAGNOSIS — C259 Malignant neoplasm of pancreas, unspecified: Secondary | ICD-10-CM | POA: Diagnosis not present

## 2017-03-06 DIAGNOSIS — M797 Fibromyalgia: Secondary | ICD-10-CM | POA: Diagnosis not present

## 2017-03-06 DIAGNOSIS — I129 Hypertensive chronic kidney disease with stage 1 through stage 4 chronic kidney disease, or unspecified chronic kidney disease: Secondary | ICD-10-CM | POA: Diagnosis not present

## 2017-03-06 DIAGNOSIS — M4854XA Collapsed vertebra, not elsewhere classified, thoracic region, initial encounter for fracture: Secondary | ICD-10-CM | POA: Diagnosis not present

## 2017-03-06 DIAGNOSIS — C250 Malignant neoplasm of head of pancreas: Secondary | ICD-10-CM | POA: Diagnosis not present

## 2017-03-06 DIAGNOSIS — K219 Gastro-esophageal reflux disease without esophagitis: Secondary | ICD-10-CM | POA: Diagnosis not present

## 2017-03-06 DIAGNOSIS — F419 Anxiety disorder, unspecified: Secondary | ICD-10-CM | POA: Diagnosis not present

## 2017-03-06 DIAGNOSIS — Z85828 Personal history of other malignant neoplasm of skin: Secondary | ICD-10-CM | POA: Diagnosis not present

## 2017-03-06 NOTE — Pre-Procedure Instructions (Signed)
Patricia Austin  03/06/2017      Patricia Austin, Patricia Austin - Patricia Austin Patricia [place] [address] Phone: [phone] Fax: [place], [place] - [place] [address] Phone: [phone] Fax: [phone]    Your procedure is scheduled on September 19  Report to [facility] at 1330 A.M.  Call this number if you have problems the morning of surgery:  [phone]   Remember:  Do not eat food or drink liquids after midnight.   Continue all other medications as directed by your physician except follow these medication instructions before surgery   Take these medicines the morning of surgery with A SIP OF WATER  acetaminophen (TYLENOL) amLODipine (NORVASC) carvedilol (COREG) DULoxetine (CYMBALTA) LORazepam (ATIVAN) ondansetron (ZOFRAN)  Oxycodone if needed pantoprazole (PROTONIX)  7 days prior to surgery STOP taking any Aspirin, Aleve, Naproxen, Ibuprofen, Motrin, Advil, Goody's, BC's, all herbal medications, fish oil, and all vitamins   WHAT DO I DO ABOUT MY DIABETES MEDICATION?   Marland Kitchen Do not take oral diabetes medicines (pills) the morning of surgery.  . THE NIGHT BEFORE SURGERY, take ___12________ units of __Insulin Glargine (TOUJEO SOLOSTAR)_________insulin.      . The day of surgery, do not take other diabetes injectables, including Byetta (exenatide), Bydureon (exenatide ER), Victoza (liraglutide), or Trulicity (dulaglutide).  . If your CBG is greater than 220 mg/dL, you may take  of your sliding scale (correction) dose of insulin.  insulin lispro (HUMALOG)    How to Manage Your Diabetes Before and After Surgery  Why is it important to control my blood sugar before and after surgery? . Improving blood sugar levels before and after surgery helps healing and can limit problems. . A way of improving blood sugar control is eating  a healthy diet by: o  Eating less sugar and carbohydrates o  Increasing activity/exercise o  Talking with your doctor about reaching your blood sugar goals . High blood sugars (greater than 180 mg/dL) can raise your risk of infections and slow your recovery, so you will need to focus on controlling your diabetes during the weeks before surgery. . Make sure that the doctor who takes care of your diabetes knows about your planned surgery including the date and location.  How do I manage my blood sugar before surgery? . Check your blood sugar at least 4 times a day, starting 2 days before surgery, to make sure that the level is not too high or low. o Check your blood sugar the morning of your surgery when you wake up and every 2 hours until you get to the Short Stay unit. . If your blood sugar is less than 70 mg/dL, you will need to treat for low blood sugar: o Do not take insulin. o Treat a low blood sugar (less than 70 mg/dL) with  cup of clear juice (cranberry or apple), 4 glucose tablets, OR glucose gel. o Recheck blood sugar in 15 minutes after treatment (to make sure it is greater than 70 mg/dL). If your blood sugar is not greater than 70 mg/dL on recheck, call [phone] for further instructions. . Report your blood sugar to the short stay nurse when you get to Short Stay.  . If you are admitted to the hospital after surgery: o Your blood sugar will be checked by the staff and you will probably be given insulin after surgery (  instead of oral diabetes medicines) to make sure you have good blood sugar levels. o The goal for blood sugar control after surgery is 80-180 mg/dL.   Do not wear jewelry, make-up or nail polish.  Do not wear lotions, powders, or perfumes, or deoderant.  Do not shave 48 hours prior to surgery.  Men may shave face and neck.  Do not bring valuables to the hospital.  [facility] is not responsible for any belongings or valuables.  Contacts, dentures or bridgework  may not be worn into surgery.  Leave your suitcase in the car.  After surgery it may be brought to your room.  For patients admitted to the hospital, discharge time will be determined by your treatment team.  Patients discharged the day of surgery will not be allowed to drive home.    Special instructions:   [facility]- Preparing For Surgery  Before surgery, you can play an important role. Because skin is not sterile, your skin needs to be as free of germs as possible. You can reduce the number of germs on your skin by washing with CHG (chlorahexidine gluconate) Soap before surgery.  CHG is an antiseptic cleaner which kills germs and bonds with the skin to continue killing germs even after washing.  Please do not use if you have an allergy to CHG or antibacterial soaps. If your skin becomes reddened/irritated stop using the CHG.  Do not shave (including legs and underarms) for at least 48 hours prior to first CHG shower. It is OK to shave your face.  Please follow these instructions carefully.   1. Shower the NIGHT BEFORE SURGERY and the MORNING OF SURGERY with CHG.   2. If you chose to wash your hair, wash your hair first as usual with your normal shampoo.  3. After you shampoo, rinse your hair and body thoroughly to remove the shampoo.  4. Use CHG as you would any other liquid soap. You can apply CHG directly to the skin and wash gently with a scrungie or a clean washcloth.   5. Apply the CHG Soap to your body ONLY FROM THE NECK DOWN.  Do not use on open wounds or open sores. Avoid contact with your eyes, ears, mouth and genitals (private parts). Wash genitals (private parts) with your normal soap.  6. Wash thoroughly, paying special attention to the area where your surgery will be performed.  7. Thoroughly rinse your body with warm water from the neck down.  8. DO NOT shower/wash with your normal soap after using and rinsing off the CHG Soap.  9. Pat yourself dry with a CLEAN  TOWEL.   10. Wear CLEAN PAJAMAS   11. Place CLEAN SHEETS on your bed the night of your first shower and DO NOT SLEEP WITH PETS.    Day of Surgery: Do not apply any deodorants/lotions. Please wear clean clothes to the hospital/surgery center.      Please read over the following fact sheets that you were given.

## 2017-03-06 NOTE — Telephone Encounter (Signed)
Message from pt's husband asking if she needs labs on 9/17 and 9/24. Yes, checking CMET only on 9/17. Left message instructing him to keep both appointments.

## 2017-03-08 ENCOUNTER — Other Ambulatory Visit: Payer: Self-pay | Admitting: Oncology

## 2017-03-09 ENCOUNTER — Encounter (HOSPITAL_COMMUNITY)
Admission: RE | Admit: 2017-03-09 | Discharge: 2017-03-09 | Disposition: A | Discharge status: Home / Self Care | Payer: Medicare Other | Source: Ambulatory Visit | Attending: Neurosurgery | Admitting: Neurosurgery

## 2017-03-09 ENCOUNTER — Encounter (HOSPITAL_COMMUNITY): Payer: Self-pay

## 2017-03-09 ENCOUNTER — Other Ambulatory Visit (HOSPITAL_BASED_OUTPATIENT_CLINIC_OR_DEPARTMENT_OTHER): Payer: Medicare Other

## 2017-03-09 ENCOUNTER — Ambulatory Visit
Admission: RE | Admit: 2017-03-09 | Discharge: 2017-03-09 | Disposition: A | Discharge status: Home / Self Care | Payer: Medicare Other | Source: Ambulatory Visit | Attending: Radiation Oncology | Admitting: Radiation Oncology

## 2017-03-09 DIAGNOSIS — Z85828 Personal history of other malignant neoplasm of skin: Secondary | ICD-10-CM | POA: Diagnosis not present

## 2017-03-09 DIAGNOSIS — N183 Chronic kidney disease, stage 3 (moderate): Secondary | ICD-10-CM

## 2017-03-09 DIAGNOSIS — Z90710 Acquired absence of both cervix and uterus: Secondary | ICD-10-CM | POA: Insufficient documentation

## 2017-03-09 DIAGNOSIS — I129 Hypertensive chronic kidney disease with stage 1 through stage 4 chronic kidney disease, or unspecified chronic kidney disease: Secondary | ICD-10-CM

## 2017-03-09 DIAGNOSIS — Z9889 Other specified postprocedural states: Secondary | ICD-10-CM | POA: Insufficient documentation

## 2017-03-09 DIAGNOSIS — Z01812 Encounter for preprocedural laboratory examination: Secondary | ICD-10-CM

## 2017-03-09 DIAGNOSIS — M797 Fibromyalgia: Secondary | ICD-10-CM | POA: Diagnosis not present

## 2017-03-09 DIAGNOSIS — F419 Anxiety disorder, unspecified: Secondary | ICD-10-CM | POA: Diagnosis present

## 2017-03-09 DIAGNOSIS — F418 Other specified anxiety disorders: Secondary | ICD-10-CM | POA: Insufficient documentation

## 2017-03-09 DIAGNOSIS — C259 Malignant neoplasm of pancreas, unspecified: Secondary | ICD-10-CM | POA: Diagnosis present

## 2017-03-09 DIAGNOSIS — Z981 Arthrodesis status: Secondary | ICD-10-CM | POA: Diagnosis not present

## 2017-03-09 DIAGNOSIS — Z9221 Personal history of antineoplastic chemotherapy: Secondary | ICD-10-CM

## 2017-03-09 DIAGNOSIS — K219 Gastro-esophageal reflux disease without esophagitis: Secondary | ICD-10-CM | POA: Diagnosis present

## 2017-03-09 DIAGNOSIS — E1122 Type 2 diabetes mellitus with diabetic chronic kidney disease: Secondary | ICD-10-CM

## 2017-03-09 DIAGNOSIS — G8929 Other chronic pain: Secondary | ICD-10-CM | POA: Diagnosis present

## 2017-03-09 DIAGNOSIS — Z96652 Presence of left artificial knee joint: Secondary | ICD-10-CM | POA: Diagnosis present

## 2017-03-09 DIAGNOSIS — M4854XA Collapsed vertebra, not elsewhere classified, thoracic region, initial encounter for fracture: Principal | ICD-10-CM | POA: Diagnosis present

## 2017-03-09 DIAGNOSIS — F329 Major depressive disorder, single episode, unspecified: Secondary | ICD-10-CM | POA: Diagnosis not present

## 2017-03-09 DIAGNOSIS — C250 Malignant neoplasm of head of pancreas: Secondary | ICD-10-CM

## 2017-03-09 LAB — COMPREHENSIVE METABOLIC PANEL
ALT: 21 U/L (ref 0–55)
ANION GAP: 6 meq/L (ref 3–11)
AST: 29 U/L (ref 5–34)
Albumin: 2.3 g/dL — ABNORMAL LOW (ref 3.5–5.0)
Alkaline Phosphatase: 773 U/L — ABNORMAL HIGH (ref 40–150)
BUN: 19.6 mg/dL (ref 7.0–26.0)
CALCIUM: 9.9 mg/dL (ref 8.4–10.4)
CHLORIDE: 103 meq/L (ref 98–109)
CO2: 28 meq/L (ref 22–29)
Creatinine: 1.2 mg/dL — ABNORMAL HIGH (ref 0.6–1.1)
EGFR: 43 mL/min/{1.73_m2} — ABNORMAL LOW (ref >90)
Glucose: 248 mg/dl — ABNORMAL HIGH (ref 70–140)
POTASSIUM: 4.1 meq/L (ref 3.5–5.1)
Sodium: 137 mEq/L (ref 136–145)
Total Bilirubin: 0.42 mg/dL (ref 0.20–1.20)
Total Protein: 6.4 g/dL (ref 6.4–8.3)

## 2017-03-09 LAB — GLUCOSE, CAPILLARY: GLUCOSE-CAPILLARY: 249 mg/dL — AB (ref 65–99)

## 2017-03-09 LAB — CBC
HCT: 29.2 % — ABNORMAL LOW (ref 36.0–46.0)
Hemoglobin: 9.1 g/dL — ABNORMAL LOW (ref 12.0–15.0)
MCH: 26.5 pg (ref 26.0–34.0)
MCHC: 31.2 g/dL (ref 30.0–36.0)
MCV: 84.9 fL (ref 78.0–100.0)
Platelets: 285 10*3/uL (ref 150–400)
RBC: 3.44 MIL/uL — ABNORMAL LOW (ref 3.87–5.11)
RDW: 18.1 % — AB (ref 11.5–15.5)
WBC: 7.9 10*3/uL (ref 4.0–10.5)

## 2017-03-09 LAB — HEMOGLOBIN A1C
HEMOGLOBIN A1C: 7.5 % — AB (ref 4.8–5.6)
MEAN PLASMA GLUCOSE: 168.55 mg/dL

## 2017-03-09 LAB — SURGICAL PCR SCREEN
MRSA, PCR: NEGATIVE
STAPHYLOCOCCUS AUREUS: NEGATIVE

## 2017-03-09 NOTE — Pre-Procedure Instructions (Signed)
Patricia Austin  03/09/2017      [facility] [place], [place] - [place] [address] Phone: [phone] Fax: [place], [place] - [place] [address] Phone: [phone] Fax: [phone]    Your procedure is scheduled on September 19, Wednesday.   Report to [facility] Admitting at 1:30 PM             (posted surgery time 3:30pm - 4:48pm)   Call this number if you have problems the morning of surgery:  [phone]   Remember:  Do not eat food or drink liquids after midnight Tuesday.   Continue all other medications as directed by your physician except follow these medication instructions before surgery   Take these medicines the morning of surgery with A SIP OF WATER  acetaminophen (TYLENOL) amLODipine (NORVASC) carvedilol (COREG) DULoxetine (CYMBALTA) LORazepam (ATIVAN) ondansetron (ZOFRAN)  Oxycodone if needed pantoprazole (PROTONIX)  7 days prior to surgery STOP taking any Aspirin, Aleve, Naproxen, Ibuprofen, Motrin, Advil, Goody's, BC's, all herbal medications, fish oil, and all vitamins   WHAT DO I DO ABOUT MY DIABETES MEDICATION?   Marland Kitchen Do not take oral diabetes medicines (pills) the morning of surgery.  . THE NIGHT BEFORE SURGERY, take ___12 units of __Insulin Glargine (TOUJEO SOLOSTAR)_insulin.      . The day of surgery, do not take other diabetes injectables, including Byetta (exenatide), Bydureon (exenatide ER), Victoza (liraglutide), or Trulicity (dulaglutide).  . If your CBG is greater than 220 mg/dL, you may take  of your sliding scale (correction) dose of insulin.  insulin lispro (HUMALOG)    How to Manage Your Diabetes Before and After Surgery  Why is it important to control my blood sugar before and after surgery? . Improving blood sugar levels before and after surgery helps healing and can limit  problems. . A way of improving blood sugar control is eating a healthy diet by: o  Eating less sugar and carbohydrates o  Increasing activity/exercise o  Talking with your doctor about reaching your blood sugar goals . High blood sugars (greater than 180 mg/dL) can raise your risk of infections and slow your recovery, so you will need to focus on controlling your diabetes during the weeks before surgery. . Make sure that the doctor who takes care of your diabetes knows about your planned surgery including the date and location.  How do I manage my blood sugar before surgery? . Check your blood sugar at least 4 times a day, starting 2 days before surgery, to make sure that the level is not too high or low.  o Check your blood sugar the morning of your surgery when you wake up and every 2 hours until you get to the Short Stay unit.  . If your blood sugar is less than 70 mg/dL, you will need to treat for low blood sugar: o Do not take insulin. o Treat a low blood sugar (less than 70 mg/dL) with  cup of clear juice (cranberry or apple), 4 glucose tablets, OR glucose gel. o Recheck blood sugar in 15 minutes after treatment (to make sure it is greater than 70 mg/dL). If your blood sugar is not greater than 70 mg/dL on recheck, call [phone] for further instructions.  . Report your blood sugar to the short stay nurse when you get to Short Stay.  . If you are  admitted to the hospital after surgery: o Your blood sugar will be checked by the staff and you will probably be given insulin after surgery (instead of oral diabetes medicines) to make sure you have good blood sugar levels. o The goal for blood sugar control after surgery is 80-180 mg/dL.   Do not wear jewelry, make-up or nail polish.  Do not wear lotions, powders, or perfumes, or deoderant.  Do not shave 48 hours prior to surgery.   Do not bring valuables to the hospital.  [facility] is not responsible for any belongings or  valuables.  Contacts, dentures or bridgework may not be worn into surgery.  Leave your suitcase in the car.  After surgery it may be brought to your room.  For patients admitted to the hospital, discharge time will be determined by your treatment team.  Patients discharged the day of surgery will not be allowed to drive home.    Special instructions:   [facility]- Preparing For Surgery  Before surgery, you can play an important role. Because skin is not sterile, your skin needs to be as free of germs as possible. You can reduce the number of germs on your skin by washing with CHG (chlorahexidine gluconate) Soap before surgery.  CHG is an antiseptic cleaner which kills germs and bonds with the skin to continue killing germs even after washing.  Please do not use if you have an allergy to CHG or antibacterial soaps. If your skin becomes reddened/irritated stop using the CHG.  Do not shave (including legs and underarms) for at least 48 hours prior to first CHG shower. It is OK to shave your face.  Please follow these instructions carefully.   1. Shower the NIGHT BEFORE SURGERY and the MORNING OF SURGERY with CHG.   2. If you chose to wash your hair, wash your hair first as usual with your normal shampoo.  3. After you shampoo, rinse your hair and body thoroughly to remove the shampoo.  4. Use CHG as you would any other liquid soap. You can apply CHG directly to the skin and wash gently with a scrungie or a clean washcloth.   5. Apply the CHG Soap to your body ONLY FROM THE NECK DOWN.  Do not use on open wounds or open sores. Avoid contact with your eyes, ears, mouth and genitals (private parts). Wash genitals (private parts) with your normal soap.  6. Wash thoroughly, paying special attention to the area where your surgery will be performed.  7. Thoroughly rinse your body with warm water from the neck down.  8. DO NOT shower/wash with your normal soap after using and rinsing off the  CHG Soap.  9. Pat yourself dry with a CLEAN TOWEL.   10. Wear CLEAN PAJAMAS   11. Place CLEAN SHEETS on your bed the night of your first shower and DO NOT SLEEP WITH PETS.    Day of Surgery: Do not apply any deodorants/lotions. Please wear clean clothes to the hospital/surgery center.      Please read over the following fact sheets that you were given.

## 2017-03-09 NOTE — Progress Notes (Addendum)
Anesthesia PAT Evaluation: Patient is a 71 year old female scheduled for kyphoplasty T11-12 on 03/11/17 by Dr. Ashok Pall. Procedure is scheduled for 3:30 PM (to work with her surgery schedule, radiation therapy was scheduled at 11:40 AM that day instead of the usual 1:40 PM; actual radiation treatment time is reportedly ~ 30 minutes).   History includes never smoker, DIFFICULT AIRWAY, pancreatic adenocarcinoma (diagnosis confirmed 10/09/16) s/p chemotherapy (gemcitabine/Abraxane; last dose 01/26/17), biliary obstruction secondary to pancreatic mass/cancer s/p wall stent and external biliary drain 10/09/16 (drain removed 10/20/16), HTN, GERD, dysrhythmia ("extra beat"), exertional dyspnea, fibromyalgia, DM2, esophageal dysmotility, chronic back pain, anxiety, depression, skin cancer excision (SCC), nephritis '55, CKD stage III, anemia, hysterectomy, appendectomy, tonsillectomy, redo L4-5 discectomy 10/23/10, L3-5 PLIF 08/20/16, left TKA 12/14/13, right IJ Port-a-cath 11/19/16 (by IR). She reports chronic leg (near chin) pains for years. - Admission 12/30/16-01/01/09 for acute on chronic respiratory failure with hypoxia and accelerated HTN in the setting of ongoing chemotherapy for pancreatic cancer. CTA showed interstitial edema (possible CHF), BUL ground-glass and nodular densities most consistent with superimposed pneumonia. No PE. Troponin 0.05--0.08 (thought to be related to demand ischemia). Normal EF, grade I diastolic dysfunction by echo. She was diuresed. Insulin increased for A1c > 9.0.  - Admission 12/24/16-12/26/16 for fever/SIRS in the setting of chemotherapy for pancreatic cancer. (Had left AMA from ED on 12/23/16.) WBC 15.8. Urine culture with multiple species, CXR unremarkable.Treated with broad spectrum antibiotics. SIRS felt related to transient bacteremia versus tumor fever.   In regards to her DIFFICULT AIRWAY history: - 08/20/16: IV induction, mask ventilation without difficulty. Initial DL with MAC 3 to  assess airway. Grade4 airway with anterior properties. Glidescope and 3 used to place 6.5 mm ETT X 1 attempt. Difficult intubation anticipated due to anterior larynx and history. Recommend future inductions with short-acting agent, and alternative techniques readily available. - 12/14/13: IV induction. Mask ventilation without difficulty. Grade 2 view. 2 attempts to place 7.0 mm ET tube using bougie stylette, MAC and 3. Comments: DL 1 with MAC 3, CRNA unable to visualize vocal cord. Reduced neck mobility. AOI on 2nd DL with MAC3 & using bougie.   - 10/23/10: Easy bag/mask ventilation. Difficult airway due to prominent teeth, large tongue, reduced mouth opening, anterior larynx. Grade 3 view. DL 5 with glidescope. Unable to pass stylette. Ultimately 6.5 ETT secured using MAC 3. Unable to pass using glidescope. Induction with short-acting agent and alternative techniques readily available recommended for future anesthetics. - 05/29/03 FastTrack LMA used to place 6.0 ETT (There was also difficultly passing ETT on 07/08/02. 2003 [facility] anesthesia records also sent. 2003-2004 anesthesia records from [facility] are on patient's hard chart for review as needed.)  - PCP is Mary-Margaret Hassell Done, FNP.  - RAD-ONC is Dr. Kyung Rudd. As on 02/13/17 he wrote, "The patient will receive 45 Gy in 25 fractions initially. It is anticipated that the patient will then receive a 5.4 gray boost to yield a final dose of 50.4 gray."  She was last seen by Shona Simpson, PA-C on 03/03/17 for nausea, postprandial pain. She is currently scheduled for radiation on 03/09/17-03/11/17 then again starting 03/16/17.   - MED-ONC is Dr. Betsy Coder. Last visit with Ned Card, NP on 03/02/17. She is aware of upcoming procedure.  - GI is Dr. Owens Loffler.   Meds include amlodipine, Xeloda, Coreg, Cymbalta, Lasix, Toujeo (glargine) 25 Units Q HS, Humalog 1-15 Units TID with meals, Ativan, Zofran, oxycodone, Protonix.  BP (!) 139/56   Pulse 87  Temp 36.8 C   Resp 20   Ht _0  (1.6 m)   Wt 160 lb 1.6 oz (72.6 kg)   SpO2 100%   BMI 28.36 kg/m   Exam shows a pleasant Caucasian female. Husband is at side. She is in a hospital wheelchair. Appears weak and pale, but in no apparent acute distress. Heart RRR. No murmur noted. Lungs clear. No ankle edema. She denied fever, cough.   EKG 12/30/16: ST at 110 bpm. Borderline repolarization abnormality. She reported prior stress test, but many years ago. Heart rate at PAT was 87 bpm.  Echo 12/31/16: Study Conclusions - Left ventricle: The cavity size was normal. Wall thickness was   increased in a pattern of mild LVH. Systolic function was normal.   The estimated ejection fraction was in the range of 60% to 65%.   Wall motion was normal; there were no regional wall motion   abnormalities. Doppler parameters are consistent with abnormal   left ventricular relaxation (grade 1 diastolic dysfunction). - Mitral valve: Subchordal papillary calcification. Calcified   annulus. Mildly thickened leaflets . - Atrial septum: No defect or patent foramen ovale was identified.  CTA chest 12/31/16: IMPRESSION: 1. No CT evidence of pulmonary embolism. 2. Cardiomegaly with evidence of CHF and mild interstitial edema. Bilateral upper lobe ground-glass and nodular densities most consistent with superimposed pneumonia. Correlation with clinical exam and follow-up resolution recommended. Small bilateral pleural effusions noted. 3. Right hilar adenopathy, likely reactive. 4. Thickened appearance of the distal esophagus, likely related to chronic irritation and esophagitis. 5. Mild compression deformity of the superior endplate of the U44, new compared to prior study of 10/27/2016. A pathologic fracture is not entirely excluded.  CXR 12/30/16: IMPRESSION: Suspect small pleural effusions. No pulmonary edema. No other acute abnormality.  Preoperative labs noted. BMET not done because patient had a CMET at  the [facility] today. Glucose 248. Cr 1.2. Alk Ph 773 (previously 811 and 752), AST 29, ALT 21. H/H 9.1/29.2 (previously 9.7/31.0 on 03/02/17 and 10.1/30.9 on 02/17/17.) A1c is 7.5. (Previously 9.8 on 11/14/16). She reports A1c done ~ July (thru [place]) that was in the "7" range, but she could not provide [place] with a name to contact to request. Recently fasting CBGs 85-160's.   She is anemic (known history of anemia, particularly since cancer diagnosis) with recent hemoglobin range of < 10. For this procedure, I will defer decision for T&S to surgeon and/or anesthesiologist. (I did notify [facility] at Dr. Lacy Duverney office.) Of note, patient expressed frustration that [facility] was not being used for blood draws. (I had discussed that IV team would have to be paged on the day of surgery to access port if she did not want labs done at PAT, but she did ultimately agree to peripheral lab stick today.)  George Hugh [facility] Short Stay Center/Anesthesiology Phone [phone] 03/10/2017 9:49 AM

## 2017-03-09 NOTE — Progress Notes (Signed)
PCP is Dr. Alphonse Guild.  They see Ronnald Collum PA, [place] [phone] Oncologist is Dr. Lisbeth Renshaw & Dr Benay Spice She does take chemo pills, is currently taking radiation--total of 28 treatments. AM blood sugars run 85-180.  In May 2018 A1C was 9.8.   Currently she says that has DOE mainly. Echo done 12/2016

## 2017-03-10 ENCOUNTER — Ambulatory Visit
Admission: RE | Admit: 2017-03-10 | Discharge: 2017-03-10 | Disposition: A | Discharge status: Home / Self Care | Payer: Medicare Other | Source: Ambulatory Visit | Attending: Radiation Oncology | Admitting: Radiation Oncology

## 2017-03-10 DIAGNOSIS — I129 Hypertensive chronic kidney disease with stage 1 through stage 4 chronic kidney disease, or unspecified chronic kidney disease: Secondary | ICD-10-CM | POA: Diagnosis not present

## 2017-03-10 DIAGNOSIS — K219 Gastro-esophageal reflux disease without esophagitis: Secondary | ICD-10-CM | POA: Diagnosis not present

## 2017-03-10 DIAGNOSIS — Z85828 Personal history of other malignant neoplasm of skin: Secondary | ICD-10-CM | POA: Diagnosis not present

## 2017-03-10 DIAGNOSIS — G8929 Other chronic pain: Secondary | ICD-10-CM | POA: Diagnosis not present

## 2017-03-10 DIAGNOSIS — M797 Fibromyalgia: Secondary | ICD-10-CM | POA: Diagnosis not present

## 2017-03-10 DIAGNOSIS — C259 Malignant neoplasm of pancreas, unspecified: Secondary | ICD-10-CM | POA: Diagnosis not present

## 2017-03-10 DIAGNOSIS — Z9221 Personal history of antineoplastic chemotherapy: Secondary | ICD-10-CM | POA: Diagnosis not present

## 2017-03-10 DIAGNOSIS — Z96652 Presence of left artificial knee joint: Secondary | ICD-10-CM | POA: Diagnosis not present

## 2017-03-10 DIAGNOSIS — N183 Chronic kidney disease, stage 3 (moderate): Secondary | ICD-10-CM | POA: Diagnosis not present

## 2017-03-10 DIAGNOSIS — E1122 Type 2 diabetes mellitus with diabetic chronic kidney disease: Secondary | ICD-10-CM | POA: Diagnosis not present

## 2017-03-10 DIAGNOSIS — M4854XA Collapsed vertebra, not elsewhere classified, thoracic region, initial encounter for fracture: Secondary | ICD-10-CM | POA: Diagnosis not present

## 2017-03-10 DIAGNOSIS — Z981 Arthrodesis status: Secondary | ICD-10-CM | POA: Diagnosis not present

## 2017-03-10 DIAGNOSIS — C250 Malignant neoplasm of head of pancreas: Secondary | ICD-10-CM | POA: Diagnosis not present

## 2017-03-11 ENCOUNTER — Encounter (HOSPITAL_COMMUNITY)
Admission: RE | Disposition: A | Discharge status: Home / Self Care | Payer: Self-pay | Source: Ambulatory Visit | Attending: Neurosurgery

## 2017-03-11 ENCOUNTER — Inpatient Hospital Stay (HOSPITAL_COMMUNITY)
Admission: RE | Admit: 2017-03-11 | Discharge: 2017-03-12 | DRG: 516 | Disposition: A | Discharge status: Home / Self Care | Payer: Medicare Other | Source: Ambulatory Visit | Attending: Neurosurgery | Admitting: Neurosurgery

## 2017-03-11 ENCOUNTER — Ambulatory Visit (HOSPITAL_COMMUNITY): Payer: Medicare Other | Admitting: Emergency Medicine

## 2017-03-11 ENCOUNTER — Encounter (HOSPITAL_COMMUNITY): Payer: Self-pay | Admitting: Urology

## 2017-03-11 ENCOUNTER — Ambulatory Visit (HOSPITAL_COMMUNITY): Payer: Medicare Other

## 2017-03-11 ENCOUNTER — Ambulatory Visit: Payer: Medicare Other

## 2017-03-11 ENCOUNTER — Ambulatory Visit (HOSPITAL_COMMUNITY): Payer: Medicare Other | Admitting: Critical Care Medicine

## 2017-03-11 DIAGNOSIS — M4854XA Collapsed vertebra, not elsewhere classified, thoracic region, initial encounter for fracture: Secondary | ICD-10-CM | POA: Diagnosis not present

## 2017-03-11 DIAGNOSIS — K219 Gastro-esophageal reflux disease without esophagitis: Secondary | ICD-10-CM | POA: Diagnosis not present

## 2017-03-11 DIAGNOSIS — Z96652 Presence of left artificial knee joint: Secondary | ICD-10-CM | POA: Diagnosis not present

## 2017-03-11 DIAGNOSIS — C259 Malignant neoplasm of pancreas, unspecified: Secondary | ICD-10-CM | POA: Diagnosis not present

## 2017-03-11 DIAGNOSIS — M797 Fibromyalgia: Secondary | ICD-10-CM | POA: Diagnosis not present

## 2017-03-11 DIAGNOSIS — S22080A Wedge compression fracture of T11-T12 vertebra, initial encounter for closed fracture: Secondary | ICD-10-CM | POA: Diagnosis not present

## 2017-03-11 DIAGNOSIS — Z419 Encounter for procedure for purposes other than remedying health state, unspecified: Secondary | ICD-10-CM

## 2017-03-11 DIAGNOSIS — N183 Chronic kidney disease, stage 3 (moderate): Secondary | ICD-10-CM | POA: Diagnosis not present

## 2017-03-11 DIAGNOSIS — E1122 Type 2 diabetes mellitus with diabetic chronic kidney disease: Secondary | ICD-10-CM | POA: Diagnosis not present

## 2017-03-11 DIAGNOSIS — Z9221 Personal history of antineoplastic chemotherapy: Secondary | ICD-10-CM | POA: Diagnosis not present

## 2017-03-11 DIAGNOSIS — F329 Major depressive disorder, single episode, unspecified: Secondary | ICD-10-CM | POA: Diagnosis not present

## 2017-03-11 DIAGNOSIS — E119 Type 2 diabetes mellitus without complications: Secondary | ICD-10-CM | POA: Diagnosis not present

## 2017-03-11 DIAGNOSIS — F419 Anxiety disorder, unspecified: Secondary | ICD-10-CM | POA: Diagnosis not present

## 2017-03-11 DIAGNOSIS — G8929 Other chronic pain: Secondary | ICD-10-CM | POA: Diagnosis not present

## 2017-03-11 DIAGNOSIS — I129 Hypertensive chronic kidney disease with stage 1 through stage 4 chronic kidney disease, or unspecified chronic kidney disease: Secondary | ICD-10-CM | POA: Diagnosis not present

## 2017-03-11 DIAGNOSIS — M549 Dorsalgia, unspecified: Secondary | ICD-10-CM | POA: Diagnosis not present

## 2017-03-11 DIAGNOSIS — S22000A Wedge compression fracture of unspecified thoracic vertebra, initial encounter for closed fracture: Secondary | ICD-10-CM | POA: Diagnosis present

## 2017-03-11 DIAGNOSIS — M5126 Other intervertebral disc displacement, lumbar region: Secondary | ICD-10-CM | POA: Diagnosis not present

## 2017-03-11 DIAGNOSIS — Z85828 Personal history of other malignant neoplasm of skin: Secondary | ICD-10-CM | POA: Diagnosis not present

## 2017-03-11 DIAGNOSIS — Z981 Arthrodesis status: Secondary | ICD-10-CM | POA: Diagnosis not present

## 2017-03-11 HISTORY — PX: KYPHOPLASTY: SHX5884

## 2017-03-11 LAB — GLUCOSE, CAPILLARY
GLUCOSE-CAPILLARY: 94 mg/dL (ref 65–99)
GLUCOSE-CAPILLARY: 85 mg/dL (ref 65–99)
GLUCOSE-CAPILLARY: 222 mg/dL — AB (ref 65–99)

## 2017-03-11 SURGERY — KYPHOPLASTY
Anesthesia: General | Site: Back

## 2017-03-11 MED ORDER — INSULIN GLARGINE 300 UNIT/ML ~~LOC~~ SOPN: 25.0000 [IU] | PEN_INJECTOR | Freq: Every day | SUBCUTANEOUS | Status: DC

## 2017-03-11 MED ORDER — IOPAMIDOL (ISOVUE-300) INJECTION 61%
INTRAVENOUS | Status: AC
  Filled 2017-03-11: qty 50

## 2017-03-11 MED ORDER — PROPOFOL 10 MG/ML IV BOLUS
INTRAVENOUS | Status: DC | PRN
  Administered 2017-03-11: 170 mg via INTRAVENOUS

## 2017-03-11 MED ORDER — PHENYLEPHRINE 40 MCG/ML (10ML) SYRINGE FOR IV PUSH (FOR BLOOD PRESSURE SUPPORT)
PREFILLED_SYRINGE | INTRAVENOUS | Status: DC | PRN
  Administered 2017-03-11: 80 ug via INTRAVENOUS
  Administered 2017-03-11: 120 ug via INTRAVENOUS
  Administered 2017-03-11: 80 ug via INTRAVENOUS

## 2017-03-11 MED ORDER — OXYCODONE HCL 5 MG PO TABS
10.0000 mg | ORAL_TABLET | ORAL | Status: DC | PRN
  Administered 2017-03-12 (×2): 10 mg via ORAL
  Filled 2017-03-11 (×2): qty 2

## 2017-03-11 MED ORDER — POTASSIUM CHLORIDE IN NACL 20-0.9 MEQ/L-% IV SOLN
INTRAVENOUS | Status: DC
  Administered 2017-03-11: 21:00:00 via INTRAVENOUS
  Filled 2017-03-11: qty 1000

## 2017-03-11 MED ORDER — DULOXETINE HCL 30 MG PO CPEP
60.0000 mg | ORAL_CAPSULE | Freq: Every day | ORAL | Status: DC
  Administered 2017-03-12: 60 mg via ORAL
  Filled 2017-03-11: qty 2

## 2017-03-11 MED ORDER — LACTATED RINGERS IV SOLN
INTRAVENOUS | Status: DC
  Administered 2017-03-11 (×2): via INTRAVENOUS

## 2017-03-11 MED ORDER — LIDOCAINE-EPINEPHRINE 0.5 %-1:200000 IJ SOLN
INTRAMUSCULAR | Status: AC
  Filled 2017-03-11: qty 1

## 2017-03-11 MED ORDER — 0.9 % SODIUM CHLORIDE (POUR BTL) OPTIME
TOPICAL | Status: DC | PRN
  Administered 2017-03-11: 1000 mL

## 2017-03-11 MED ORDER — IOPAMIDOL (ISOVUE-300) INJECTION 61%
INTRAVENOUS | Status: DC | PRN
  Administered 2017-03-11: 50 mL

## 2017-03-11 MED ORDER — LIDOCAINE-EPINEPHRINE 0.5 %-1:200000 IJ SOLN
INTRAMUSCULAR | Status: DC | PRN
  Administered 2017-03-11: 8 mL

## 2017-03-11 MED ORDER — AMLODIPINE BESYLATE 5 MG PO TABS
5.0000 mg | ORAL_TABLET | Freq: Every day | ORAL | Status: DC
  Administered 2017-03-12: 5 mg via ORAL
  Filled 2017-03-11: qty 1

## 2017-03-11 MED ORDER — ONDANSETRON HCL 4 MG PO TABS: 8.0000 mg | ORAL_TABLET | Freq: Three times a day (TID) | ORAL | Status: DC | PRN

## 2017-03-11 MED ORDER — INSULIN ASPART 100 UNIT/ML ~~LOC~~ SOLN
1.0000 [IU] | Freq: Three times a day (TID) | SUBCUTANEOUS | Status: DC
  Administered 2017-03-12: 10 [IU] via SUBCUTANEOUS

## 2017-03-11 MED ORDER — CAPECITABINE 500 MG PO TABS: 1500.0000 mg | ORAL_TABLET | ORAL | Status: DC

## 2017-03-11 MED ORDER — PROPOFOL 10 MG/ML IV BOLUS
INTRAVENOUS | Status: AC
  Filled 2017-03-11: qty 20

## 2017-03-11 MED ORDER — SUCCINYLCHOLINE CHLORIDE 20 MG/ML IJ SOLN
INTRAMUSCULAR | Status: DC | PRN
  Administered 2017-03-11: 120 mg via INTRAVENOUS

## 2017-03-11 MED ORDER — PANTOPRAZOLE SODIUM 40 MG PO TBEC
40.0000 mg | DELAYED_RELEASE_TABLET | Freq: Every day | ORAL | Status: DC
  Administered 2017-03-12: 40 mg via ORAL
  Filled 2017-03-11: qty 1

## 2017-03-11 MED ORDER — CHLORHEXIDINE GLUCONATE CLOTH 2 % EX PADS: 6.0000 | MEDICATED_PAD | Freq: Once | CUTANEOUS | Status: DC

## 2017-03-11 MED ORDER — CEFAZOLIN SODIUM-DEXTROSE 2-4 GM/100ML-% IV SOLN
2.0000 g | INTRAVENOUS | Status: AC
  Administered 2017-03-11: 2 g via INTRAVENOUS
  Filled 2017-03-11: qty 100

## 2017-03-11 MED ORDER — PHENYLEPHRINE HCL 10 MG/ML IJ SOLN
INTRAVENOUS | Status: DC | PRN
  Administered 2017-03-11: 50 ug/min via INTRAVENOUS

## 2017-03-11 MED ORDER — CARVEDILOL 6.25 MG PO TABS
25.0000 mg | ORAL_TABLET | Freq: Two times a day (BID) | ORAL | Status: DC
  Administered 2017-03-11 – 2017-03-12 (×2): 25 mg via ORAL
  Filled 2017-03-11 (×2): qty 4

## 2017-03-11 MED ORDER — ONDANSETRON HCL 4 MG/2ML IJ SOLN
INTRAMUSCULAR | Status: DC | PRN
  Administered 2017-03-11: 4 mg via INTRAVENOUS

## 2017-03-11 MED ORDER — INSULIN GLARGINE 100 UNIT/ML ~~LOC~~ SOLN
25.0000 [IU] | Freq: Every day | SUBCUTANEOUS | Status: DC
  Administered 2017-03-11: 25 [IU] via SUBCUTANEOUS
  Filled 2017-03-11 (×3): qty 0.25

## 2017-03-11 MED ORDER — FENTANYL CITRATE (PF) 100 MCG/2ML IJ SOLN
INTRAMUSCULAR | Status: DC | PRN
Start: 2017-03-11 — End: 2017-03-11
  Administered 2017-03-11: 175 ug via INTRAVENOUS
  Administered 2017-03-11: 25 ug via INTRAVENOUS

## 2017-03-11 MED ORDER — DEXAMETHASONE SODIUM PHOSPHATE 10 MG/ML IJ SOLN
INTRAMUSCULAR | Status: DC | PRN
  Administered 2017-03-11: 10 mg via INTRAVENOUS

## 2017-03-11 MED ORDER — OXYCODONE HCL ER 15 MG PO T12A
15.0000 mg | EXTENDED_RELEASE_TABLET | Freq: Two times a day (BID) | ORAL | Status: DC
  Administered 2017-03-11 – 2017-03-12 (×2): 15 mg via ORAL
  Filled 2017-03-11 (×2): qty 1

## 2017-03-11 MED ORDER — FENTANYL CITRATE (PF) 250 MCG/5ML IJ SOLN
INTRAMUSCULAR | Status: AC
  Filled 2017-03-11: qty 5

## 2017-03-11 MED ORDER — FENTANYL CITRATE (PF) 100 MCG/2ML IJ SOLN: 25.0000 ug | INTRAMUSCULAR | Status: DC | PRN

## 2017-03-11 MED ORDER — LORAZEPAM 0.5 MG PO TABS
0.5000 mg | ORAL_TABLET | ORAL | Status: DC | PRN
  Administered 2017-03-12: 0.5 mg via ORAL
  Filled 2017-03-11: qty 1

## 2017-03-11 MED ORDER — FUROSEMIDE 20 MG PO TABS
20.0000 mg | ORAL_TABLET | ORAL | Status: DC
  Administered 2017-03-12: 20 mg via ORAL
  Filled 2017-03-11: qty 1

## 2017-03-11 MED ORDER — ACETAMINOPHEN 500 MG PO TABS: 500.0000 mg | ORAL_TABLET | Freq: Four times a day (QID) | ORAL | Status: DC | PRN

## 2017-03-11 MED ORDER — LIDOCAINE HCL (CARDIAC) 20 MG/ML IV SOLN
INTRAVENOUS | Status: DC | PRN
  Administered 2017-03-11: 80 mg via INTRAVENOUS

## 2017-03-11 MED ORDER — CARVEDILOL 6.25 MG PO TABS: 25.0000 mg | ORAL_TABLET | Freq: Two times a day (BID) | ORAL | Status: DC

## 2017-03-11 SURGICAL SUPPLY — 43 items
ADH SKN CLS APL DERMABOND .7 (GAUZE/BANDAGES/DRESSINGS) ×1
BLADE CLIPPER SURG (BLADE) IMPLANT
BLADE SURG 15 STRL LF DISP TIS (BLADE) ×1 IMPLANT
BLADE SURG 15 STRL SS (BLADE) ×3
CARTRIDGE OIL MAESTRO DRILL (MISCELLANEOUS) ×1 IMPLANT
CEMENT KYPHON C01A KIT/MIXER (Cement) ×2 IMPLANT
DERMABOND ADVANCED (GAUZE/BANDAGES/DRESSINGS) ×2
DERMABOND ADVANCED .7 DNX12 (GAUZE/BANDAGES/DRESSINGS) ×1 IMPLANT
DIFFUSER DRILL AIR PNEUMATIC (MISCELLANEOUS) ×1 IMPLANT
DRAPE C-ARM 42X72 X-RAY (DRAPES) ×3 IMPLANT
DRAPE HALF SHEET 40X57 (DRAPES) ×3 IMPLANT
DRAPE LAPAROTOMY 100X72X124 (DRAPES) ×3 IMPLANT
DRAPE SURG 17X23 STRL (DRAPES) ×1 IMPLANT
DRAPE WARM FLUID 44X44 (DRAPE) ×3 IMPLANT
DURAPREP 26ML APPLICATOR (WOUND CARE) ×3 IMPLANT
GAUZE SPONGE 4X4 16PLY XRAY LF (GAUZE/BANDAGES/DRESSINGS) ×3 IMPLANT
GLOVE BIOGEL PI IND STRL 8 (GLOVE) IMPLANT
GLOVE BIOGEL PI INDICATOR 8 (GLOVE) ×4
GLOVE ECLIPSE 6.5 STRL STRAW (GLOVE) ×3 IMPLANT
GLOVE ECLIPSE 7.5 STRL STRAW (GLOVE) ×4 IMPLANT
GLOVE EXAM NITRILE LRG STRL (GLOVE) IMPLANT
GLOVE EXAM NITRILE XL STR (GLOVE) IMPLANT
GLOVE EXAM NITRILE XS STR PU (GLOVE) IMPLANT
GOWN STRL REUS W/ TWL LRG LVL3 (GOWN DISPOSABLE) ×2 IMPLANT
GOWN STRL REUS W/ TWL XL LVL3 (GOWN DISPOSABLE) IMPLANT
GOWN STRL REUS W/TWL 2XL LVL3 (GOWN DISPOSABLE) ×2 IMPLANT
GOWN STRL REUS W/TWL LRG LVL3 (GOWN DISPOSABLE) ×3
GOWN STRL REUS W/TWL XL LVL3 (GOWN DISPOSABLE)
KIT BASIN OR (CUSTOM PROCEDURE TRAY) ×3 IMPLANT
KIT ROOM TURNOVER OR (KITS) ×3 IMPLANT
NDL HYPO 25X1 1.5 SAFETY (NEEDLE) ×1 IMPLANT
NEEDLE HYPO 25X1 1.5 SAFETY (NEEDLE) ×3 IMPLANT
NS IRRIG 1000ML POUR BTL (IV SOLUTION) ×3 IMPLANT
OIL CARTRIDGE MAESTRO DRILL (MISCELLANEOUS)
PACK SURGICAL SETUP 50X90 (CUSTOM PROCEDURE TRAY) ×3 IMPLANT
PAD ARMBOARD 7.5X6 YLW CONV (MISCELLANEOUS) ×7 IMPLANT
SPECIMEN JAR SMALL (MISCELLANEOUS) IMPLANT
STAPLER SKIN PROX WIDE 3.9 (STAPLE) ×3 IMPLANT
SUT VIC AB 3-0 SH 8-18 (SUTURE) ×3 IMPLANT
SYR CONTROL 10ML LL (SYRINGE) ×4 IMPLANT
TOWEL GREEN STERILE (TOWEL DISPOSABLE) ×3 IMPLANT
TOWEL GREEN STERILE FF (TOWEL DISPOSABLE) ×1 IMPLANT
TRAY KYPHOPAK 15/3 ONESTEP 1ST (MISCELLANEOUS) ×2 IMPLANT

## 2017-03-11 NOTE — Anesthesia Preprocedure Evaluation (Signed)
Anesthesia Evaluation  Patient identified by MRN, date of birth, ID band Patient awake    Reviewed: Allergy & Precautions, NPO status , Patient's Chart, lab work & pertinent test results  History of Anesthesia Complications (+) DIFFICULT AIRWAY  Airway Mallampati: II  TM Distance: >3 FB     Dental   Pulmonary shortness of breath,    breath sounds clear to auscultation       Cardiovascular hypertension, + dysrhythmias  Rhythm:Regular Rate:Normal     Neuro/Psych    GI/Hepatic Neg liver ROS, GERD  ,  Endo/Other  diabetes  Renal/GU Renal disease     Musculoskeletal   Abdominal   Peds  Hematology   Anesthesia Other Findings   Reproductive/Obstetrics                             Anesthesia Physical Anesthesia Plan  ASA: III  Anesthesia Plan: General   Post-op Pain Management:    Induction: Intravenous  PONV Risk Score and Plan: 3 and Ondansetron, Dexamethasone, Midazolam and Propofol infusion  Airway Management Planned: Oral ETT  Additional Equipment:   Intra-op Plan:   Post-operative Plan: Possible Post-op intubation/ventilation  Informed Consent: I have reviewed the patients History and Physical, chart, labs and discussed the procedure including the risks, benefits and alternatives for the proposed anesthesia with the patient or authorized representative who has indicated his/her understanding and acceptance.   Dental advisory given  Plan Discussed with: CRNA and Anesthesiologist  Anesthesia Plan Comments:         Anesthesia Quick Evaluation

## 2017-03-11 NOTE — Anesthesia Procedure Notes (Signed)
Procedure Name: Intubation Date/Time: 03/11/2017 1:28 PM Performed by: Merrilyn Puma B Pre-anesthesia Checklist: Patient identified, Emergency Drugs available, Suction available, Patient being monitored and Timeout performed Patient Re-evaluated:Patient Re-evaluated prior to induction Oxygen Delivery Method: Circle system utilized Preoxygenation: Pre-oxygenation with 100% oxygen Induction Type: IV induction Ventilation: Mask ventilation without difficulty Grade View: Grade I Tube type: Oral Tube size: 7.0 mm Number of attempts: 1 Airway Equipment and Method: Stylet and Video-laryngoscopy Placement Confirmation: ETT inserted through vocal cords under direct vision,  positive ETCO2,  CO2 detector and breath sounds checked- equal and bilateral Secured at: 21 cm Tube secured with: Tape Dental Injury: Teeth and Oropharynx as per pre-operative assessment  Difficulty Due To: Difficulty was anticipated Future Recommendations: Recommend- induction with short-acting agent, and alternative techniques readily available

## 2017-03-11 NOTE — Anesthesia Postprocedure Evaluation (Signed)
Anesthesia Post Note  Patient: Patricia Austin  Procedure(s) Performed: Procedure(s) (LRB): KYPHOPLASTY THORACIC TWELVE (N/A)     Patient location during evaluation: PACU Anesthesia Type: General Level of consciousness: awake Pain management: pain level controlled Vital Signs Assessment: post-procedure vital signs reviewed and stable Respiratory status: spontaneous breathing Cardiovascular status: stable Anesthetic complications: no    Last Vitals:  Vitals:   03/11/17 1520 03/11/17 1525  BP:  (!) 157/53  Pulse: 88 86  Resp:  19  Temp: (!) 36.4 C   SpO2: 100% 100%    Last Pain:  Vitals:   03/11/17 1042  TempSrc:   PainSc: 7                  Sadiq Mccauley

## 2017-03-11 NOTE — H&P (Signed)
BP 139/60   Pulse (!) 59   Temp 97.7 F (36.5 C) (Oral)   Resp 19   SpO2 99%  Mrs. Backes returns today. She is in a wheelchair secondary to severe pain that she is having in the back. She says this pain has been ongoing since she saw me. She is diagnosed now pancreatic cancer and continues to lose weight. She is slightly jaundiced today. She was hospitalized until a day or so ago. At that time, she underwent multiple CTs, one of which appeared to show compression fracture, which was new from May in T12. I will have her undergo an MRI to confirm this, and then if need be, do a kyphoplasty.  I asked if she had pain medications, she said just a few pills. She says her primary care doctor is doing it and not an oncologist, and I found that surprising, as she does have pancreatic cancer, which is quite debilitating. Nevertheless, I gave her medication today so that she does not run out. I certainly appreciate the efforts of the primary care physician. I will speak to her after we get the MRI. She will not need another visit. We did discuss the kyphoplasty today. No Known Allergies Past Medical History:  Diagnosis Date  . Anxiety    "not all the time"   . Chronic lower back pain   . CKD (chronic kidney disease) stage 3, GFR 30-59 ml/min   . Complication of anesthesia 2002   difficulty /w intubation, with plastic surgery , because she was feeling so anxious. 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 .   Pt. reports that she  is very sensitive to med. & is hard  to wake up.  . Depression    "not all the time"   . Difficult intubation 2002   with head surgery 2002 has done ok with later intubations  . Dysrhythmia    told that she sometimes has extra beats. Does not see cardiologist, had a stress test many yrs. ago>10 yrs. ...update 11-14-16 " i havent had that feelign in a long time "  . Esophageal dysmotility    "getting worse recenly" (12/15/2013)  . Fibromyalgia   . GERD (gastroesophageal  reflux disease)   . Hypertension   . Nephritis ~ 1955  . Osteoarthritis    knees   . Pancreatic cancer ([place])   . Pancreatic mass   . Renal insufficiency   . Seasonal allergies    "in the spring"  . Shortness of breath    walking long distances   . squamous cell carcinoma scalp    squamous, on scalp, & has had several surgeries on scalp, for plastics repair of scalp   . Type II diabetes mellitus ([place])    dx 1970"s   Past Surgical History:  Procedure Laterality Date  . ABDOMINAL HYSTERECTOMY     complete  . APPENDECTOMY    . BACK SURGERY  07/2016   L 4 and L 5 fusion done [facility]   . CARPAL TUNNEL RELEASE Right   . CARPAL TUNNEL RELEASE Left   . COLONOSCOPY    . CYST EXCISION  1990's?   scalp  . ESOPHAGOGASTRODUODENOSCOPY N/A 09/26/2016   Procedure: ESOPHAGOGASTRODUODENOSCOPY (EGD);  Surgeon: Gatha Mayer, MD;  Location: [facility] ENDOSCOPY;  Service: Endoscopy;  Laterality: N/A;  . EUS N/A 10/09/2016   Procedure: UPPER ENDOSCOPIC ULTRASOUND (EUS) LINEAR;  Surgeon: Milus Banister, MD;  Location: [facility] ENDOSCOPY;  Service: Endoscopy;  Laterality: N/A;  .  IR BILIARY STENT(S) EXISTING ACCESS INC DILATION CATH EXCHANGE  10/09/2016  . IR CHOLANGIOGRAM EXISTING TUBE  10/15/2016  . IR FLUORO GUIDE PORT INSERTION RIGHT  11/19/2016  . IR INT EXT BILIARY DRAIN WITH CHOLANGIOGRAM  09/29/2016   2 gallstones  . IR REMOVAL BILIARY DRAIN  10/20/2016  . IR US GUIDE VASC ACCESS RIGHT  11/19/2016  . JOINT REPLACEMENT    . LUMBAR DISC SURGERY  ~ 2012 X 2   double fusion ; last was 08-21-15  . RADIOLOGY WITH ANESTHESIA N/A 11/19/2016   Procedure: PORT PLACEMENT WITH GENERAL ANESTHESIA;  Surgeon: Arne Cleveland, MD;  Location: [facility] ORS;  Service: Anesthesiology;  Laterality: N/A;  . SKIN GRAFT  2002   scalp; "went to [facility] to repair OR earlier in the yearr"  . SQUAMOUS CELL CARCINOMA EXCISION  2002   "off scalp; cancerous area removed and stapled; Dr. Georgia Lopes"  . TONSILLECTOMY  age 23  . TOTAL KNEE  ARTHROPLASTY Left 12/14/2013   Procedure: LEFT TOTAL KNEE ARTHROPLASTY;  Surgeon: Ninetta Lights, MD;  Location: [place];  Service: Orthopedics;  Laterality: Left;  . TOTAL KNEE ARTHROPLASTY Left 12/15/2013   Family History  Problem Relation Age of Onset  . Early death Mother   . Thrombosis Father    Social History   Social History  . Marital status: Married    Spouse name: N/A  . Number of children: N/A  . Years of education: N/A   Occupational History  . Not on file.   Social History Main Topics  . Smoking status: Never Smoker  . Smokeless tobacco: Never Used  . Alcohol use No  . Drug use: No  . Sexual activity: No   Other Topics Concern  . Not on file   Social History Narrative  . No narrative on file   OR for Kyphoplasty.

## 2017-03-11 NOTE — Transfer of Care (Signed)
Immediate Anesthesia Transfer of Care Note  Patient: Patricia Austin  Procedure(s) Performed: Procedure(s): KYPHOPLASTY THORACIC TWELVE (N/A)  Patient Location: PACU  Anesthesia Type:General  Level of Consciousness: awake, alert  and oriented  Airway & Oxygen Therapy: Patient Spontanous Breathing and Patient connected to nasal cannula oxygen  Post-op Assessment: Report given to RN, Post -op Vital signs reviewed and stable and Patient moving all extremities X 4  Post vital signs: Reviewed and stable  Last Vitals:  Vitals:   03/11/17 1039 03/11/17 1520  BP: 139/60   Pulse: (!) 59   Resp: 19   Temp: 36.5 C (!) 36.4 C  SpO2: 99%     Last Pain:  Vitals:   03/11/17 1042  TempSrc:   PainSc: 7          Complications: No apparent anesthesia complications

## 2017-03-11 NOTE — Op Note (Signed)
03/11/2017  2:53 PM  PATIENT:  Patricia Austin  71 y.o. [place] with back pain and a compression fracture at T12  PRE-OPERATIVE DIAGNOSIS:  WEDGE COMPRESSION FRACTURE  THORACIC VERTEBRA  POST-OPERATIVE DIAGNOSIS:  WEDGE COMPRESSION FRACTURE  THORACIC VERTEBRA  PROCEDURE:  Procedure(s): KYPHOPLASTY THORACIC TWELVE  SURGEON:  Surgeon(s): Ashok Pall, MD  ANESTHESIA:   local and general  EBL:  Total I/O In: 750 [I.V.:750] Out: 0   BLOOD ADMINISTERED:none  COUNT:per nursing  SPECIMEN:  No Specimen  DICTATION: [name] was taken to the operating room, intubated and placed under a general anesthetic without difficulty. She was positioned prone on the operating room table with all pressure points properly padded. Her back was prepped and draped in a sterile manner. With fluoroscopy I localized the T12 pedicles bilaterally. I injected lidocaine into the entry sites on both the left and right sides. I started by making a stab incision on the left side and entering the T12 pedicle with fluoroscopic guidance. Once good position was obtained, I drilled into the vertebral body. I then placed the kyphoplasty balloon into the T12 vertebra and inflated the balloon. I then inserted 3.5cc of methylmethacrylate into the vertebral body under fluoroscopic guidance. I achieved a goodfill of the cavity, staying within the confines of the vertebral body. I removed the instrumentation from the vertebral body, and the final films looked good. I closed the stab incision with vicryl suture and used Dermabond for a sterile dressing.I repeated this procedure on the right side filling the cavity with 3cc methylmethacrylate.    PLAN OF CARE: Admit to inpatient   PATIENT DISPOSITION:  PACU - hemodynamically stable.   Delay start of Pharmacological VTE agent (>24hrs) due to surgical blood loss or risk of bleeding:  yes

## 2017-03-12 ENCOUNTER — Encounter (HOSPITAL_COMMUNITY): Payer: Self-pay | Admitting: Neurosurgery

## 2017-03-12 ENCOUNTER — Ambulatory Visit: Payer: Medicare Other

## 2017-03-12 DIAGNOSIS — Z96652 Presence of left artificial knee joint: Secondary | ICD-10-CM | POA: Diagnosis not present

## 2017-03-12 DIAGNOSIS — Z9221 Personal history of antineoplastic chemotherapy: Secondary | ICD-10-CM | POA: Diagnosis not present

## 2017-03-12 DIAGNOSIS — M4854XA Collapsed vertebra, not elsewhere classified, thoracic region, initial encounter for fracture: Secondary | ICD-10-CM | POA: Diagnosis not present

## 2017-03-12 DIAGNOSIS — G8929 Other chronic pain: Secondary | ICD-10-CM | POA: Diagnosis not present

## 2017-03-12 DIAGNOSIS — N183 Chronic kidney disease, stage 3 (moderate): Secondary | ICD-10-CM | POA: Diagnosis not present

## 2017-03-12 DIAGNOSIS — M797 Fibromyalgia: Secondary | ICD-10-CM | POA: Diagnosis not present

## 2017-03-12 DIAGNOSIS — Z981 Arthrodesis status: Secondary | ICD-10-CM | POA: Diagnosis not present

## 2017-03-12 DIAGNOSIS — K219 Gastro-esophageal reflux disease without esophagitis: Secondary | ICD-10-CM | POA: Diagnosis not present

## 2017-03-12 DIAGNOSIS — Z85828 Personal history of other malignant neoplasm of skin: Secondary | ICD-10-CM | POA: Diagnosis not present

## 2017-03-12 DIAGNOSIS — E1122 Type 2 diabetes mellitus with diabetic chronic kidney disease: Secondary | ICD-10-CM | POA: Diagnosis not present

## 2017-03-12 DIAGNOSIS — I129 Hypertensive chronic kidney disease with stage 1 through stage 4 chronic kidney disease, or unspecified chronic kidney disease: Secondary | ICD-10-CM | POA: Diagnosis not present

## 2017-03-12 DIAGNOSIS — C259 Malignant neoplasm of pancreas, unspecified: Secondary | ICD-10-CM | POA: Diagnosis not present

## 2017-03-12 LAB — GLUCOSE, CAPILLARY
GLUCOSE-CAPILLARY: 93 mg/dL (ref 65–99)
Glucose-Capillary: 249 mg/dL — ABNORMAL HIGH (ref 65–99)
Glucose-Capillary: 268 mg/dL — ABNORMAL HIGH (ref 65–99)

## 2017-03-12 MED ORDER — MENTHOL 3 MG MT LOZG
1.0000 | LOZENGE | OROMUCOSAL | Status: DC | PRN
  Administered 2017-03-12: 3 mg via ORAL
  Filled 2017-03-12: qty 9

## 2017-03-12 NOTE — Discharge Summary (Signed)
BP (!) 132/56 (BP Location: Right Arm)   Pulse 79   Temp (!) 97.5 F (36.4 C) (Oral)   Resp 18   Ht 5\' 3"  (1.6 m)   Wt 72.6 kg (160 lb)   SpO2 99%   BMI 28.34 kg/m  Physician Discharge Summary  Patient ID: Patricia Austin MRN: [id number] DOB/AGE: 1946/01/10 71 y.o.  Admit date: 03/11/2017 Discharge date: 03/12/2017  Admission Diagnoses:compression fracture T12  Discharge Diagnoses:  Active Problems:   Compression fracture of thoracic vertebra [facility])   Discharged Condition: good  Hospital Course: Patricia Austin was taken to the operating room and had an uncomplicated thoracic kyphoplasty at T12. Post op she is voiding, ambulating, and tolerating a regular diet. Her wounds are clean, dry, and without signs of infection.   Treatments: surgery: T12 bilateral kyphoplasty  Discharge Exam: Blood pressure (!) 132/56, pulse 79, temperature (!) 97.5 F (36.4 C), temperature source Oral, resp. rate 18, height 5\' 3"  (1.6 m), weight 72.6 kg (160 lb), SpO2 99 %. alert, oriented, wheel chair bound. moving all extremities  Disposition: 01-Home or Self Care WEDGE COMPRESSION FRACTURE  THORACIC VERTEBRA  Allergies as of 03/12/2017   No Known Allergies     Medication List    TAKE these medications   acetaminophen 500 MG tablet Commonly known as:  TYLENOL Take 500-1,000 mg by mouth every 6 (six) hours as needed for moderate pain.   amLODipine 5 MG tablet Commonly known as:  NORVASC Take 1 tablet (5 mg total) by mouth daily.   capecitabine 500 MG tablet Commonly known as:  XELODA Take 3 tablets (1500mg ) by mouth BID, within 30 minutes of finishing a meal, on days of radiation only What changed:  how much to take  how to take this  when to take this  additional instructions   carvedilol 25 MG tablet Commonly known as:  COREG TAKE  (1)  TABLET TWICE A DAY WITH MEALS (BREAKFAST AND SUPPER) What changed:  how much to take  how to take this  when to take this  additional  instructions   DULoxetine 60 MG capsule Commonly known as:  CYMBALTA Take 1 capsule (60 mg total) by mouth daily.   furosemide 20 MG tablet Commonly known as:  LASIX Take 1-2 tablets (20-40 mg total) by mouth daily. Take 40mg  daily for 3days then take 20mg  every other day What changed:  how much to take  when to take this  additional instructions   glucose blood test strip Commonly known as:  ONE TOUCH ULTRA TEST Test up to qid. DX:E11.65   Insulin Glargine 300 UNIT/ML Sopn Commonly known as:  TOUJEO SOLOSTAR Inject 15 Units into the skin daily. What changed:  how much to take  when to take this   insulin lispro 100 UNIT/ML injection Commonly known as:  HUMALOG Up to 15u with each meal What changed:  how much to take  how to take this  when to take this  additional instructions   lidocaine-prilocaine cream Commonly known as:  EMLA Apply to port site one hour prior to use. Do not rub in. Cover with plastic.   LORazepam 0.5 MG tablet Commonly known as:  ATIVAN Take 0.5 mg by mouth every 4 (four) hours as needed for anxiety.   ondansetron 8 MG tablet Commonly known as:  ZOFRAN Take 1 tablet (8 mg total) by mouth every 8 (eight) hours as needed for nausea or vomiting.   Oxycodone HCl 10 MG Tabs Take 1 tablet (  10 mg total) by mouth every 4 (four) hours as needed.   pantoprazole 40 MG tablet Commonly known as:  PROTONIX Take 1 tablet (40 mg total) by mouth daily before breakfast.      Follow-up Information    Ashok Pall, MD. Call in 3 week(s).   Specialty:  Neurosurgery Why:  call to make an appointment in 3 weeks please Contact information: [address] [place] [address] [phone]           Signed: Winfield Cunas 03/12/2017, 4:56 PM

## 2017-03-12 NOTE — Discharge Instructions (Signed)
Care After ?These instructions give you information on caring for yourself after your procedure. Your doctor may also give you more specific instructions. Call your doctor if you have any problems or questions after your procedure. ?HOME CARE ?Take medicine as told by your doctor. ?Keep your wound covered for 24 hours or as told by your doctor. ?Ask your doctor when you can bathe or shower. ?Put ice on your wound if it helps your pain. ?Put ice in a plastic bag. ?Place a towel between your skin and the bag. ?Leave the ice on for 15 to 20 minutes, 3 to 4 times a day. ? ?Return to normal activities as told by your doctor. ?Ask your doctor what stretches and exercises you can do. ?Do not bend or lift anything heavy as told by your doctor. ?GET HELP RIGHT AWAY IF:  ?You have bad back pain that comes on suddenly. ?You cannot control when you pee (urinate) or poop (bowel movement). ?You lose feeling (numbness) in your legs or feet, or they become weak. ?You have shooting pain down your legs. ?You have a fever. ?Your wound becomes red, puffy (swollen), or tender to the touch. ?You are bleeding or leaking fluid from the wound. ?You are sick to your stomach (nauseous) or throw up (vomit) for more than 24 hours after the procedure. ?Your back pain does not get better. ?MAKE SURE YOU: ?Understand these instructions. ?Will watch your condition. ?Will get help right away if you are not doing well or get worse. ?Document Released: 09/03/2009 Document Revised: 09/01/2011 Document Reviewed: 11/13/2010 ?ExitCare? Patient Information ?2013 [employer].  ?

## 2017-03-12 NOTE — Progress Notes (Signed)
Patient alert and oriented, mae's well, voiding adequate amount of urine, swallowing without difficulty, no c/o pain at time of discharge. Patient discharged home with family. Script and discharged instructions given to patient. Patient and family stated understanding of instructions given. Patient has an appointment with Dr. Christella Noa

## 2017-03-12 NOTE — Evaluation (Signed)
Physical Therapy Evaluation Patient Details Name: Patricia Austin MRN: [id number] DOB: 03-31-1946 Today's Date: 03/12/2017   History of Present Illness  Pt is a 71 y/o female with a PMH of CA on radiation, DM. She presents s/p T12 kyphoplasty on 03/11/17.   Clinical Impression  Pt admitted with above diagnosis. Pt currently with functional limitations due to the deficits listed below (see PT Problem List). At the time of PT eval pt was able to perform transfers and ambulation with gross min guard assist for balance support and safety. Recommended use of RW vs rollator at this time 2 need for more stability. Pt has had several falls at home which she reports is due to R knee giving out. Pt and husband were educated on fall prevention and safety at home. Pt will benefit from skilled PT to increase their independence and safety with mobility to allow discharge to the venue listed below.       Follow Up Recommendations Outpatient PT;Supervision for mobility/OOB (When appropriate per post-op protocol)    Equipment Recommendations  None recommended by PT    Recommendations for Other Services       Precautions / Restrictions Precautions Precautions: Fall;Back (for comfort) Precaution Booklet Issued: No Precaution Comments: Reviewed back precautions for comfort Restrictions Weight Bearing Restrictions: No      Mobility  Bed Mobility Overal bed mobility: Needs Assistance Bed Mobility: Rolling;Sidelying to Sit Rolling: Supervision Sidelying to sit: Min guard       General bed mobility comments: Hands-on guarding for safety as pt transitioned to EOB. VC's for log roll.  Transfers Overall transfer level: Needs assistance Equipment used: Rolling walker (2 wheeled) Transfers: Sit to/from Stand Sit to Stand: Min guard         General transfer comment: VC's for hand placement on seated surface for safety. Pt was able to power-up to full standing position with hands-on guarding at the gait  belt for safety.   Ambulation/Gait Ambulation/Gait assistance: Min guard Ambulation Distance (Feet): 100 Feet Assistive device: Rolling walker (2 wheeled) Gait Pattern/deviations: Step-through pattern;Decreased stride length;Trunk flexed;Step-to pattern Gait velocity: Decreased Gait velocity interpretation: Below normal speed for age/gender General Gait Details: Slow and guarded with ambulation. Pt was able to improve distance from morning when RN was walking with pt. She reports feeling like weight bearing through RLE was easier this time.   Stairs            Wheelchair Mobility    Modified Rankin (Stroke Patients Only)       Balance Overall balance assessment: Needs assistance Sitting-balance support: Feet supported;No upper extremity supported Sitting balance-Leahy Scale: Fair   Postural control: Other (comment) (posterior pelvic tilt) Standing balance support: Bilateral upper extremity supported;During functional activity Standing balance-Leahy Scale: Poor                               Pertinent Vitals/Pain Pain Assessment: Faces Faces Pain Scale: Hurts little more Pain Location: Incision site Pain Descriptors / Indicators: Operative site guarding;Discomfort Pain Intervention(s): Limited activity within patient's tolerance;Monitored during session;Repositioned    Home Living Family/patient expects to be discharged to:: Private residence Living Arrangements: Spouse/significant other Available Help at Discharge: Family;Available 24 hours/day Type of Home: House Home Access: Ramped entrance     Home Layout: Two level;Able to live on main level with bedroom/bathroom Home Equipment: Gilford Rile - 2 wheels;Walker - 4 wheels;Bedside commode;Tub bench;Wheelchair - manual      Prior  Function Level of Independence: Needs assistance   Gait / Transfers Assistance Needed: Pt w/c bound for the past month - husband pivot transferred her to/from            Hand Dominance   Dominant Hand: Right    Extremity/Trunk Assessment   Upper Extremity Assessment Upper Extremity Assessment: Overall WFL for tasks assessed    Lower Extremity Assessment Lower Extremity Assessment: Generalized weakness;RLE deficits/detail;LLE deficits/detail RLE Deficits / Details: Pt reports she needs a knee replacement on that side and it frequently gives out on her LLE Deficits / Details: Previous TKA and reports it is still weak    Cervical / Trunk Assessment Cervical / Trunk Assessment: Kyphotic  Communication   Communication: No difficulties  Cognition Arousal/Alertness: Awake/alert Behavior During Therapy: WFL for tasks assessed/performed Overall Cognitive Status: Within Functional Limits for tasks assessed                                        General Comments      Exercises     Assessment/Plan    PT Assessment Patient needs continued PT services  PT Problem List Decreased strength;Decreased range of motion;Decreased activity tolerance;Decreased balance;Decreased mobility;Decreased knowledge of use of DME;Decreased safety awareness;Decreased knowledge of precautions;Pain       PT Treatment Interventions DME instruction;Gait training;Stair training;Functional mobility training;Therapeutic activities;Therapeutic exercise;Neuromuscular re-education;Patient/family education    PT Goals (Current goals can be found in the Care Plan section)  Acute Rehab PT Goals Patient Stated Goal: Live long enough to get her R knee replaced PT Goal Formulation: With patient/family Time For Goal Achievement: 03/19/17 Potential to Achieve Goals: Good    Frequency Min 5X/week   Barriers to discharge        Co-evaluation               AM-PAC PT "6 Clicks" Daily Activity  Outcome Measure Difficulty turning over in bed (including adjusting bedclothes, sheets and blankets)?: A Little Difficulty moving from lying on back to sitting  on the side of the bed? : A Little Difficulty sitting down on and standing up from a chair with arms (e.g., wheelchair, bedside commode, etc,.)?: A Little Help needed moving to and from a bed to chair (including a wheelchair)?: A Little Help needed walking in hospital room?: A Little Help needed climbing 3-5 steps with a railing? : A Lot 6 Click Score: 17    End of Session Equipment Utilized During Treatment: Gait belt Activity Tolerance: Patient limited by fatigue Patient left: in chair;with call bell/phone within reach;with family/visitor present Nurse Communication: Mobility status PT Visit Diagnosis: Unsteadiness on feet (R26.81);Repeated falls (R29.6);Pain Pain - part of body:  (back)    Time: 4782-9562 PT Time Calculation (min) (ACUTE ONLY): 34 min   Charges:   PT Evaluation $PT Eval Moderate Complexity: 1 Mod PT Treatments $Gait Training: 8-22 mins   PT G Codes:        Rolinda Roan, PT, DPT Acute Rehabilitation Services Pager: [phone]   Thelma Comp 03/12/2017, 12:42 PM

## 2017-03-13 ENCOUNTER — Ambulatory Visit: Payer: Medicare Other

## 2017-03-13 ENCOUNTER — Ambulatory Visit
Admission: RE | Admit: 2017-03-13 | Discharge: 2017-03-13 | Disposition: A | Discharge status: Home / Self Care | Payer: Medicare Other | Source: Ambulatory Visit | Attending: Radiation Oncology | Admitting: Radiation Oncology

## 2017-03-13 DIAGNOSIS — I129 Hypertensive chronic kidney disease with stage 1 through stage 4 chronic kidney disease, or unspecified chronic kidney disease: Secondary | ICD-10-CM | POA: Diagnosis not present

## 2017-03-13 DIAGNOSIS — Z90710 Acquired absence of both cervix and uterus: Secondary | ICD-10-CM | POA: Diagnosis not present

## 2017-03-13 DIAGNOSIS — E1122 Type 2 diabetes mellitus with diabetic chronic kidney disease: Secondary | ICD-10-CM | POA: Diagnosis not present

## 2017-03-13 DIAGNOSIS — Z510 Encounter for antineoplastic radiation therapy: Secondary | ICD-10-CM | POA: Diagnosis not present

## 2017-03-13 DIAGNOSIS — Z79899 Other long term (current) drug therapy: Secondary | ICD-10-CM | POA: Diagnosis not present

## 2017-03-13 DIAGNOSIS — Z85828 Personal history of other malignant neoplasm of skin: Secondary | ICD-10-CM | POA: Diagnosis not present

## 2017-03-13 DIAGNOSIS — M179 Osteoarthritis of knee, unspecified: Secondary | ICD-10-CM | POA: Diagnosis not present

## 2017-03-13 DIAGNOSIS — Z981 Arthrodesis status: Secondary | ICD-10-CM | POA: Diagnosis not present

## 2017-03-13 DIAGNOSIS — F329 Major depressive disorder, single episode, unspecified: Secondary | ICD-10-CM | POA: Diagnosis not present

## 2017-03-13 DIAGNOSIS — M797 Fibromyalgia: Secondary | ICD-10-CM | POA: Diagnosis not present

## 2017-03-13 DIAGNOSIS — Z96652 Presence of left artificial knee joint: Secondary | ICD-10-CM | POA: Diagnosis not present

## 2017-03-13 DIAGNOSIS — F419 Anxiety disorder, unspecified: Secondary | ICD-10-CM | POA: Diagnosis not present

## 2017-03-13 DIAGNOSIS — M545 Low back pain: Secondary | ICD-10-CM | POA: Diagnosis not present

## 2017-03-13 DIAGNOSIS — N183 Chronic kidney disease, stage 3 (moderate): Secondary | ICD-10-CM | POA: Diagnosis not present

## 2017-03-13 DIAGNOSIS — G8929 Other chronic pain: Secondary | ICD-10-CM | POA: Diagnosis not present

## 2017-03-13 DIAGNOSIS — Z794 Long term (current) use of insulin: Secondary | ICD-10-CM | POA: Diagnosis not present

## 2017-03-13 DIAGNOSIS — C250 Malignant neoplasm of head of pancreas: Secondary | ICD-10-CM | POA: Diagnosis not present

## 2017-03-16 ENCOUNTER — Other Ambulatory Visit (HOSPITAL_BASED_OUTPATIENT_CLINIC_OR_DEPARTMENT_OTHER): Payer: Medicare Other

## 2017-03-16 ENCOUNTER — Ambulatory Visit
Admission: RE | Admit: 2017-03-16 | Discharge: 2017-03-16 | Disposition: A | Discharge status: Home / Self Care | Payer: Medicare Other | Source: Ambulatory Visit | Attending: Radiation Oncology | Admitting: Radiation Oncology

## 2017-03-16 ENCOUNTER — Ambulatory Visit (HOSPITAL_BASED_OUTPATIENT_CLINIC_OR_DEPARTMENT_OTHER): Payer: Medicare Other | Admitting: Oncology

## 2017-03-16 ENCOUNTER — Ambulatory Visit (HOSPITAL_BASED_OUTPATIENT_CLINIC_OR_DEPARTMENT_OTHER): Payer: Medicare Other

## 2017-03-16 ENCOUNTER — Telehealth: Payer: Self-pay | Admitting: Oncology

## 2017-03-16 VITALS — BP 135/62 | HR 96 | Temp 98.6°F | Resp 16 | Ht 63.0 in | Wt 160.6 lb

## 2017-03-16 DIAGNOSIS — Z510 Encounter for antineoplastic radiation therapy: Secondary | ICD-10-CM | POA: Diagnosis not present

## 2017-03-16 DIAGNOSIS — M797 Fibromyalgia: Secondary | ICD-10-CM | POA: Diagnosis not present

## 2017-03-16 DIAGNOSIS — C250 Malignant neoplasm of head of pancreas: Secondary | ICD-10-CM | POA: Diagnosis not present

## 2017-03-16 DIAGNOSIS — M179 Osteoarthritis of knee, unspecified: Secondary | ICD-10-CM | POA: Diagnosis not present

## 2017-03-16 DIAGNOSIS — N183 Chronic kidney disease, stage 3 (moderate): Secondary | ICD-10-CM | POA: Diagnosis not present

## 2017-03-16 DIAGNOSIS — Z95828 Presence of other vascular implants and grafts: Secondary | ICD-10-CM

## 2017-03-16 DIAGNOSIS — Z981 Arthrodesis status: Secondary | ICD-10-CM | POA: Diagnosis not present

## 2017-03-16 DIAGNOSIS — I129 Hypertensive chronic kidney disease with stage 1 through stage 4 chronic kidney disease, or unspecified chronic kidney disease: Secondary | ICD-10-CM | POA: Diagnosis not present

## 2017-03-16 DIAGNOSIS — R531 Weakness: Secondary | ICD-10-CM | POA: Diagnosis not present

## 2017-03-16 DIAGNOSIS — Z85828 Personal history of other malignant neoplasm of skin: Secondary | ICD-10-CM | POA: Diagnosis not present

## 2017-03-16 DIAGNOSIS — Z794 Long term (current) use of insulin: Secondary | ICD-10-CM | POA: Diagnosis not present

## 2017-03-16 DIAGNOSIS — M545 Low back pain: Secondary | ICD-10-CM | POA: Diagnosis not present

## 2017-03-16 DIAGNOSIS — E1122 Type 2 diabetes mellitus with diabetic chronic kidney disease: Secondary | ICD-10-CM | POA: Diagnosis not present

## 2017-03-16 DIAGNOSIS — Z79899 Other long term (current) drug therapy: Secondary | ICD-10-CM | POA: Diagnosis not present

## 2017-03-16 DIAGNOSIS — Z96652 Presence of left artificial knee joint: Secondary | ICD-10-CM | POA: Diagnosis not present

## 2017-03-16 DIAGNOSIS — R110 Nausea: Secondary | ICD-10-CM | POA: Diagnosis not present

## 2017-03-16 DIAGNOSIS — G8929 Other chronic pain: Secondary | ICD-10-CM | POA: Diagnosis not present

## 2017-03-16 LAB — COMPREHENSIVE METABOLIC PANEL
ALK PHOS: 322 U/L — AB (ref 40–150)
ALT: <6 U/L (ref 0–55)
ANION GAP: 6 meq/L (ref 3–11)
AST: 22 U/L (ref 5–34)
Albumin: 2.2 g/dL — ABNORMAL LOW (ref 3.5–5.0)
BUN: 14.6 mg/dL (ref 7.0–26.0)
CHLORIDE: 103 meq/L (ref 98–109)
CO2: 27 meq/L (ref 22–29)
Calcium: 9 mg/dL (ref 8.4–10.4)
Creatinine: 1 mg/dL (ref 0.6–1.1)
EGFR: 55 mL/min/{1.73_m2} — AB (ref >90)
Glucose: 147 mg/dl — ABNORMAL HIGH (ref 70–140)
Potassium: 3.5 mEq/L (ref 3.5–5.1)
Sodium: 137 mEq/L (ref 136–145)
Total Bilirubin: 0.39 mg/dL (ref 0.20–1.20)
Total Protein: 5.6 g/dL — ABNORMAL LOW (ref 6.4–8.3)

## 2017-03-16 LAB — CBC WITH DIFFERENTIAL/PLATELET
BASO%: 0.1 % (ref 0.0–2.0)
Basophils Absolute: 0 10*3/uL (ref 0.0–0.1)
EOS ABS: 0.4 10*3/uL (ref 0.0–0.5)
EOS%: 4.5 % (ref 0.0–7.0)
HCT: 29 % — ABNORMAL LOW (ref 34.8–46.6)
HGB: 9.2 g/dL — ABNORMAL LOW (ref 11.6–15.9)
LYMPH%: 2.5 % — AB (ref 14.0–49.7)
MCH: 27.3 pg (ref 25.1–34.0)
MCHC: 31.7 g/dL (ref 31.5–36.0)
MCV: 86.1 fL (ref 79.5–101.0)
MONO#: 0.6 10*3/uL (ref 0.1–0.9)
MONO%: 7.2 % (ref 0.0–14.0)
NEUT#: 7.3 10*3/uL — ABNORMAL HIGH (ref 1.5–6.5)
NEUT%: 85.7 % — AB (ref 38.4–76.8)
PLATELETS: 181 10*3/uL (ref 145–400)
RBC: 3.37 10*6/uL — AB (ref 3.70–5.45)
RDW: 19.2 % — ABNORMAL HIGH (ref 11.2–14.5)
WBC: 8.5 10*3/uL (ref 3.9–10.3)
lymph#: 0.2 10*3/uL — ABNORMAL LOW (ref 0.9–3.3)

## 2017-03-16 MED ORDER — HEPARIN SOD (PORK) LOCK FLUSH 100 UNIT/ML IV SOLN
500.0000 [IU] | Freq: Once | INTRAVENOUS | Status: AC
  Administered 2017-03-16: 500 [IU]
  Filled 2017-03-16: qty 5

## 2017-03-16 MED ORDER — SODIUM CHLORIDE 0.9% FLUSH
10.0000 mL | Freq: Once | INTRAVENOUS | Status: AC
  Administered 2017-03-16: 10 mL
  Filled 2017-03-16: qty 10

## 2017-03-16 NOTE — Progress Notes (Signed)
[facility] OFFICE PROGRESS NOTE   Diagnosis: Pancreas cancer  INTERVAL HISTORY:   [name] returns as scheduled. She underwent a T12 kyphoplasty by Dr. Cyndy Freeze on 03/11/2017. She reports improvement in left leg pain. Right leg weakness is unchanged. She relates part of the weakness 2 instability at the right knee. She is scheduled to see orthopedics. She began concurrent Xeloda and radiation 02/25/2017. No mouth sores or hand/foot pain. Mild diarrhea. Her chief complaint is nausea and abdominal pain. She is taking Zofran for nausea.  She states in the house most of the time. She moves around the house in a wheelchair.  Objective:  Vital signs in last 24 hours:  Blood pressure 135/62, pulse 96, temperature 98.6 F (37 C), temperature source Oral, resp. rate 16, height 5\' 3"  (1.6 m), weight 160 lb 9.6 oz (72.8 kg), SpO2 100 %.    HEENT:  no thrush or ulcers Resp:  lungs clear bilaterally Cardio:  regular rate and rhythm GI:  no hepatosplenomegaly, no mass, mild tenderness in the right medial subcostal region Vascular:  no leg edema Neurologic: The strength is decreased with flexion at the right hip and extension at the right knee  Skin: Palms and soles without erythema or skin breakdown   Portacath/PICC-without erythema  Lab Results:  Lab Results  Component Value Date   WBC 8.5 03/16/2017   HGB 9.2 (L) 03/16/2017   HCT 29.0 (L) 03/16/2017   MCV 86.1 03/16/2017   PLT 181 03/16/2017   NEUTROABS 7.3 (H) 03/16/2017    CMP     Component Value Date/Time   NA 137 03/16/2017 1153   K 3.5 03/16/2017 1153   CL 99 (L) 01/01/2017 0323   CO2 27 03/16/2017 1153   GLUCOSE 147 (H) 03/16/2017 1153   BUN 14.6 03/16/2017 1153   CREATININE 1.0 03/16/2017 1153   CALCIUM 9.0 03/16/2017 1153   PROT 5.6 (L) 03/16/2017 1153   ALBUMIN 2.2 (L) 03/16/2017 1153   AST 22 03/16/2017 1153   ALT <6 03/16/2017 1153   ALKPHOS 322 (H) 03/16/2017 1153   BILITOT 0.39 03/16/2017  1153   GFRNONAA 31 (L) 01/01/2017 0323   GFRAA 36 (L) 01/01/2017 0323     Medications: I have reviewed the patient's current medications.  Assessment/Plan: 1. Adenocarcinoma the pancreas-pancreas head/uncinatemass noted on a CT abdomen/pelvis 09/20/2016, status post an EUS biopsy 10/09/2016 confirming adenocarcinoma  CT 09/20/2016-pancreas head/uncinatemass, possible contact with the superior mesenteric artery, biliary dilatation, small low lung nodules  CTs chest, abdomen, and pelvis 10/27/2016-enlarging pancreas head mass with vascular abutment, no evidence of metastatic disease  Cycle 1 gemcitabine/Abraxane 11/25/2016  Cycle 2 gemcitabine/Abraxane 12/08/2016  Cycle 3 gemcitabine/Abraxane 12/22/2016  Cycle 4 gemcitabine/Abraxane 01/13/2017  Cycle 5 gemcitabine/Abraxane 01/26/2017  CT 02/04/2017-stable pancreas mass with abutment of the aorta, superior mesenteric artery, portal vein, and superior Mr. brain. No evidence of liver metastases. Stable right lower lobe nodule.  Radiation/Xeloda-beginning 02/25/2017  2. Biliary obstruction secondary to #1, status post placement of a wall stent and external biliary drain 10/09/2016  Biliary drain removed 10/20/2016  3. Diabetes-admitted with marked hyperglycemia 10/27/2016  4. History of squamous cell carcinoma of the scalp  5. Lumbar fusion surgery February 2018  6. 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 History of Abdominal/back pain secondary to pancreas cancer  8. Cholangitis 10/07/2016  9. Confusionduring hospital admission 10/27/2016-etiology unclear, MRI brain negative for metastatic disease on 10/29/2016, improved  10. Port-A-Cath placement 11/19/2016  11. Admission 12/30/2016 with dyspnea, chest CT consistent with pulmonary edema  12. Right leg weakness-MRI lumbar spine August 2018-no evidence of metastatic disease; superior endplate  fracture at D98. Loss of height 10%. No retropulsed bone. Consistent with a benign osteoporotic fracture.  T12 kyphoplasty 03/11/2017   Disposition:   Patricia Austin is tolerating the Xeloda and radiation well. She will continue Zofran and lorazepam as needed for nausea. I suspect the nausea is related to the pancreas cancer and radiation though Xeloda may be contributing. She will return for an office visit in 2 weeks.  She is scheduled to see orthopedics for evaluation of the right knee.  Patricia Romberg, MD  03/16/2017  1:36 PM

## 2017-03-16 NOTE — Telephone Encounter (Signed)
Gave avs and calendar for October  °

## 2017-03-17 ENCOUNTER — Ambulatory Visit
Admission: RE | Admit: 2017-03-17 | Discharge: 2017-03-17 | Disposition: A | Discharge status: Home / Self Care | Payer: Medicare Other | Source: Ambulatory Visit | Attending: Radiation Oncology | Admitting: Radiation Oncology

## 2017-03-17 DIAGNOSIS — M179 Osteoarthritis of knee, unspecified: Secondary | ICD-10-CM | POA: Diagnosis not present

## 2017-03-17 DIAGNOSIS — M797 Fibromyalgia: Secondary | ICD-10-CM | POA: Diagnosis not present

## 2017-03-17 DIAGNOSIS — Z85828 Personal history of other malignant neoplasm of skin: Secondary | ICD-10-CM | POA: Diagnosis not present

## 2017-03-17 DIAGNOSIS — Z981 Arthrodesis status: Secondary | ICD-10-CM | POA: Diagnosis not present

## 2017-03-17 DIAGNOSIS — Z794 Long term (current) use of insulin: Secondary | ICD-10-CM | POA: Diagnosis not present

## 2017-03-17 DIAGNOSIS — M545 Low back pain: Secondary | ICD-10-CM | POA: Diagnosis not present

## 2017-03-17 DIAGNOSIS — Z79899 Other long term (current) drug therapy: Secondary | ICD-10-CM | POA: Diagnosis not present

## 2017-03-17 DIAGNOSIS — M25561 Pain in right knee: Secondary | ICD-10-CM | POA: Diagnosis not present

## 2017-03-17 DIAGNOSIS — M25562 Pain in left knee: Secondary | ICD-10-CM | POA: Diagnosis not present

## 2017-03-17 DIAGNOSIS — I129 Hypertensive chronic kidney disease with stage 1 through stage 4 chronic kidney disease, or unspecified chronic kidney disease: Secondary | ICD-10-CM | POA: Diagnosis not present

## 2017-03-17 DIAGNOSIS — N183 Chronic kidney disease, stage 3 (moderate): Secondary | ICD-10-CM | POA: Diagnosis not present

## 2017-03-17 DIAGNOSIS — G8929 Other chronic pain: Secondary | ICD-10-CM | POA: Diagnosis not present

## 2017-03-17 DIAGNOSIS — E1122 Type 2 diabetes mellitus with diabetic chronic kidney disease: Secondary | ICD-10-CM | POA: Diagnosis not present

## 2017-03-17 DIAGNOSIS — C250 Malignant neoplasm of head of pancreas: Secondary | ICD-10-CM | POA: Diagnosis not present

## 2017-03-17 DIAGNOSIS — Z510 Encounter for antineoplastic radiation therapy: Secondary | ICD-10-CM | POA: Diagnosis not present

## 2017-03-17 DIAGNOSIS — Z96652 Presence of left artificial knee joint: Secondary | ICD-10-CM | POA: Diagnosis not present

## 2017-03-18 ENCOUNTER — Ambulatory Visit
Admission: RE | Admit: 2017-03-18 | Discharge: 2017-03-18 | Disposition: A | Discharge status: Home / Self Care | Payer: Medicare Other | Source: Ambulatory Visit | Attending: Radiation Oncology | Admitting: Radiation Oncology

## 2017-03-18 DIAGNOSIS — M179 Osteoarthritis of knee, unspecified: Secondary | ICD-10-CM | POA: Diagnosis not present

## 2017-03-18 DIAGNOSIS — M797 Fibromyalgia: Secondary | ICD-10-CM | POA: Diagnosis not present

## 2017-03-18 DIAGNOSIS — Z85828 Personal history of other malignant neoplasm of skin: Secondary | ICD-10-CM | POA: Diagnosis not present

## 2017-03-18 DIAGNOSIS — E1122 Type 2 diabetes mellitus with diabetic chronic kidney disease: Secondary | ICD-10-CM | POA: Diagnosis not present

## 2017-03-18 DIAGNOSIS — G8929 Other chronic pain: Secondary | ICD-10-CM | POA: Diagnosis not present

## 2017-03-18 DIAGNOSIS — Z794 Long term (current) use of insulin: Secondary | ICD-10-CM | POA: Diagnosis not present

## 2017-03-18 DIAGNOSIS — Z981 Arthrodesis status: Secondary | ICD-10-CM | POA: Diagnosis not present

## 2017-03-18 DIAGNOSIS — N183 Chronic kidney disease, stage 3 (moderate): Secondary | ICD-10-CM | POA: Diagnosis not present

## 2017-03-18 DIAGNOSIS — I129 Hypertensive chronic kidney disease with stage 1 through stage 4 chronic kidney disease, or unspecified chronic kidney disease: Secondary | ICD-10-CM | POA: Diagnosis not present

## 2017-03-18 DIAGNOSIS — Z96652 Presence of left artificial knee joint: Secondary | ICD-10-CM | POA: Diagnosis not present

## 2017-03-18 DIAGNOSIS — M545 Low back pain: Secondary | ICD-10-CM | POA: Diagnosis not present

## 2017-03-18 DIAGNOSIS — C250 Malignant neoplasm of head of pancreas: Secondary | ICD-10-CM | POA: Diagnosis not present

## 2017-03-18 DIAGNOSIS — Z510 Encounter for antineoplastic radiation therapy: Secondary | ICD-10-CM | POA: Diagnosis not present

## 2017-03-18 DIAGNOSIS — Z79899 Other long term (current) drug therapy: Secondary | ICD-10-CM | POA: Diagnosis not present

## 2017-03-19 ENCOUNTER — Ambulatory Visit
Admission: RE | Admit: 2017-03-19 | Discharge: 2017-03-19 | Disposition: A | Discharge status: Home / Self Care | Payer: Medicare Other | Source: Ambulatory Visit | Attending: Radiation Oncology | Admitting: Radiation Oncology

## 2017-03-19 DIAGNOSIS — Z96652 Presence of left artificial knee joint: Secondary | ICD-10-CM | POA: Diagnosis not present

## 2017-03-19 DIAGNOSIS — M797 Fibromyalgia: Secondary | ICD-10-CM | POA: Diagnosis not present

## 2017-03-19 DIAGNOSIS — M179 Osteoarthritis of knee, unspecified: Secondary | ICD-10-CM | POA: Diagnosis not present

## 2017-03-19 DIAGNOSIS — C250 Malignant neoplasm of head of pancreas: Secondary | ICD-10-CM | POA: Diagnosis not present

## 2017-03-19 DIAGNOSIS — N183 Chronic kidney disease, stage 3 (moderate): Secondary | ICD-10-CM | POA: Diagnosis not present

## 2017-03-19 DIAGNOSIS — Z85828 Personal history of other malignant neoplasm of skin: Secondary | ICD-10-CM | POA: Diagnosis not present

## 2017-03-19 DIAGNOSIS — E1122 Type 2 diabetes mellitus with diabetic chronic kidney disease: Secondary | ICD-10-CM | POA: Diagnosis not present

## 2017-03-19 DIAGNOSIS — Z794 Long term (current) use of insulin: Secondary | ICD-10-CM | POA: Diagnosis not present

## 2017-03-19 DIAGNOSIS — Z79899 Other long term (current) drug therapy: Secondary | ICD-10-CM | POA: Diagnosis not present

## 2017-03-19 DIAGNOSIS — G8929 Other chronic pain: Secondary | ICD-10-CM | POA: Diagnosis not present

## 2017-03-19 DIAGNOSIS — M545 Low back pain: Secondary | ICD-10-CM | POA: Diagnosis not present

## 2017-03-19 DIAGNOSIS — Z981 Arthrodesis status: Secondary | ICD-10-CM | POA: Diagnosis not present

## 2017-03-19 DIAGNOSIS — Z510 Encounter for antineoplastic radiation therapy: Secondary | ICD-10-CM | POA: Diagnosis not present

## 2017-03-19 DIAGNOSIS — I129 Hypertensive chronic kidney disease with stage 1 through stage 4 chronic kidney disease, or unspecified chronic kidney disease: Secondary | ICD-10-CM | POA: Diagnosis not present

## 2017-03-20 ENCOUNTER — Ambulatory Visit
Admission: RE | Admit: 2017-03-20 | Discharge: 2017-03-20 | Disposition: A | Discharge status: Home / Self Care | Payer: Medicare Other | Source: Ambulatory Visit | Attending: Radiation Oncology | Admitting: Radiation Oncology

## 2017-03-20 DIAGNOSIS — C250 Malignant neoplasm of head of pancreas: Secondary | ICD-10-CM | POA: Diagnosis not present

## 2017-03-20 DIAGNOSIS — Z981 Arthrodesis status: Secondary | ICD-10-CM | POA: Diagnosis not present

## 2017-03-20 DIAGNOSIS — N183 Chronic kidney disease, stage 3 (moderate): Secondary | ICD-10-CM | POA: Diagnosis not present

## 2017-03-20 DIAGNOSIS — M179 Osteoarthritis of knee, unspecified: Secondary | ICD-10-CM | POA: Diagnosis not present

## 2017-03-20 DIAGNOSIS — M545 Low back pain: Secondary | ICD-10-CM | POA: Diagnosis not present

## 2017-03-20 DIAGNOSIS — Z79899 Other long term (current) drug therapy: Secondary | ICD-10-CM | POA: Diagnosis not present

## 2017-03-20 DIAGNOSIS — M797 Fibromyalgia: Secondary | ICD-10-CM | POA: Diagnosis not present

## 2017-03-20 DIAGNOSIS — E1122 Type 2 diabetes mellitus with diabetic chronic kidney disease: Secondary | ICD-10-CM | POA: Diagnosis not present

## 2017-03-20 DIAGNOSIS — G8929 Other chronic pain: Secondary | ICD-10-CM | POA: Diagnosis not present

## 2017-03-20 DIAGNOSIS — Z96652 Presence of left artificial knee joint: Secondary | ICD-10-CM | POA: Diagnosis not present

## 2017-03-20 DIAGNOSIS — Z794 Long term (current) use of insulin: Secondary | ICD-10-CM | POA: Diagnosis not present

## 2017-03-20 DIAGNOSIS — Z510 Encounter for antineoplastic radiation therapy: Secondary | ICD-10-CM | POA: Diagnosis not present

## 2017-03-20 DIAGNOSIS — Z85828 Personal history of other malignant neoplasm of skin: Secondary | ICD-10-CM | POA: Diagnosis not present

## 2017-03-20 DIAGNOSIS — I129 Hypertensive chronic kidney disease with stage 1 through stage 4 chronic kidney disease, or unspecified chronic kidney disease: Secondary | ICD-10-CM | POA: Diagnosis not present

## 2017-03-23 ENCOUNTER — Ambulatory Visit
Admission: RE | Admit: 2017-03-23 | Discharge: 2017-03-23 | Disposition: A | Discharge status: Home / Self Care | Payer: Medicare Other | Source: Ambulatory Visit | Attending: Radiation Oncology | Admitting: Radiation Oncology

## 2017-03-23 DIAGNOSIS — E1122 Type 2 diabetes mellitus with diabetic chronic kidney disease: Secondary | ICD-10-CM | POA: Diagnosis not present

## 2017-03-23 DIAGNOSIS — M545 Low back pain: Secondary | ICD-10-CM | POA: Diagnosis not present

## 2017-03-23 DIAGNOSIS — Z90710 Acquired absence of both cervix and uterus: Secondary | ICD-10-CM | POA: Diagnosis not present

## 2017-03-23 DIAGNOSIS — Z79899 Other long term (current) drug therapy: Secondary | ICD-10-CM | POA: Diagnosis not present

## 2017-03-23 DIAGNOSIS — Z85828 Personal history of other malignant neoplasm of skin: Secondary | ICD-10-CM | POA: Diagnosis not present

## 2017-03-23 DIAGNOSIS — Z510 Encounter for antineoplastic radiation therapy: Secondary | ICD-10-CM | POA: Diagnosis not present

## 2017-03-23 DIAGNOSIS — I129 Hypertensive chronic kidney disease with stage 1 through stage 4 chronic kidney disease, or unspecified chronic kidney disease: Secondary | ICD-10-CM | POA: Diagnosis not present

## 2017-03-23 DIAGNOSIS — M179 Osteoarthritis of knee, unspecified: Secondary | ICD-10-CM | POA: Diagnosis not present

## 2017-03-23 DIAGNOSIS — F329 Major depressive disorder, single episode, unspecified: Secondary | ICD-10-CM | POA: Diagnosis not present

## 2017-03-23 DIAGNOSIS — Z794 Long term (current) use of insulin: Secondary | ICD-10-CM | POA: Diagnosis not present

## 2017-03-23 DIAGNOSIS — Z96652 Presence of left artificial knee joint: Secondary | ICD-10-CM | POA: Diagnosis not present

## 2017-03-23 DIAGNOSIS — N183 Chronic kidney disease, stage 3 (moderate): Secondary | ICD-10-CM | POA: Diagnosis not present

## 2017-03-23 DIAGNOSIS — M797 Fibromyalgia: Secondary | ICD-10-CM | POA: Diagnosis not present

## 2017-03-23 DIAGNOSIS — F419 Anxiety disorder, unspecified: Secondary | ICD-10-CM | POA: Diagnosis not present

## 2017-03-23 DIAGNOSIS — Z981 Arthrodesis status: Secondary | ICD-10-CM | POA: Diagnosis not present

## 2017-03-23 DIAGNOSIS — C250 Malignant neoplasm of head of pancreas: Secondary | ICD-10-CM | POA: Diagnosis not present

## 2017-03-23 DIAGNOSIS — G8929 Other chronic pain: Secondary | ICD-10-CM | POA: Diagnosis not present

## 2017-03-24 ENCOUNTER — Other Ambulatory Visit: Payer: Self-pay | Admitting: *Deleted

## 2017-03-24 ENCOUNTER — Ambulatory Visit
Admission: RE | Admit: 2017-03-24 | Discharge: 2017-03-24 | Disposition: A | Discharge status: Home / Self Care | Payer: Medicare Other | Source: Ambulatory Visit | Attending: Radiation Oncology | Admitting: Radiation Oncology

## 2017-03-24 DIAGNOSIS — M797 Fibromyalgia: Secondary | ICD-10-CM | POA: Diagnosis not present

## 2017-03-24 DIAGNOSIS — N183 Chronic kidney disease, stage 3 (moderate): Secondary | ICD-10-CM | POA: Diagnosis not present

## 2017-03-24 DIAGNOSIS — I129 Hypertensive chronic kidney disease with stage 1 through stage 4 chronic kidney disease, or unspecified chronic kidney disease: Secondary | ICD-10-CM | POA: Diagnosis not present

## 2017-03-24 DIAGNOSIS — Z981 Arthrodesis status: Secondary | ICD-10-CM | POA: Diagnosis not present

## 2017-03-24 DIAGNOSIS — M179 Osteoarthritis of knee, unspecified: Secondary | ICD-10-CM | POA: Diagnosis not present

## 2017-03-24 DIAGNOSIS — C250 Malignant neoplasm of head of pancreas: Secondary | ICD-10-CM | POA: Diagnosis not present

## 2017-03-24 DIAGNOSIS — Z79899 Other long term (current) drug therapy: Secondary | ICD-10-CM | POA: Diagnosis not present

## 2017-03-24 DIAGNOSIS — Z96652 Presence of left artificial knee joint: Secondary | ICD-10-CM | POA: Diagnosis not present

## 2017-03-24 DIAGNOSIS — Z85828 Personal history of other malignant neoplasm of skin: Secondary | ICD-10-CM | POA: Diagnosis not present

## 2017-03-24 DIAGNOSIS — E1122 Type 2 diabetes mellitus with diabetic chronic kidney disease: Secondary | ICD-10-CM | POA: Diagnosis not present

## 2017-03-24 DIAGNOSIS — Z510 Encounter for antineoplastic radiation therapy: Secondary | ICD-10-CM | POA: Diagnosis not present

## 2017-03-24 DIAGNOSIS — M545 Low back pain: Secondary | ICD-10-CM | POA: Diagnosis not present

## 2017-03-24 DIAGNOSIS — Z794 Long term (current) use of insulin: Secondary | ICD-10-CM | POA: Diagnosis not present

## 2017-03-24 DIAGNOSIS — G8929 Other chronic pain: Secondary | ICD-10-CM | POA: Diagnosis not present

## 2017-03-24 MED ORDER — PROCHLORPERAZINE MALEATE 10 MG PO TABS: 10.0000 mg | ORAL_TABLET | Freq: Four times a day (QID) | ORAL | 1 refills | Status: DC | PRN

## 2017-03-25 ENCOUNTER — Ambulatory Visit: Payer: Medicare Other

## 2017-03-25 ENCOUNTER — Encounter: Payer: Self-pay | Admitting: Radiation Oncology

## 2017-03-25 NOTE — Progress Notes (Signed)
thanks

## 2017-03-25 NOTE — Progress Notes (Addendum)
The patient was seen yesterday following her treatment due to nausea. She had been taking Zofran 8 mg TID and Ativan 0.5 mg prn without significant relief. She reports nausea in the morning that lasts all day. She denies emesis, and reports that she is willing to try other things to help her. She denies any headaches, changes in mental status which is confirmed by her husband, or any visual or auditory changes. I gave her a prescription for Compazine to try q 6 hours in addition to Zofran 8 mg q 8 hours. If this does not work she will call me or let us know when she's over at the cancer center for treatment. We did discuss the option of ordering a brain MRI as well if her symptoms did not improve as well as ordering scopalamine.

## 2017-03-26 ENCOUNTER — Ambulatory Visit
Admission: RE | Admit: 2017-03-26 | Discharge: 2017-03-26 | Disposition: A | Discharge status: Home / Self Care | Payer: Medicare Other | Source: Ambulatory Visit | Attending: Radiation Oncology | Admitting: Radiation Oncology

## 2017-03-26 DIAGNOSIS — Z96652 Presence of left artificial knee joint: Secondary | ICD-10-CM | POA: Diagnosis not present

## 2017-03-26 DIAGNOSIS — Z510 Encounter for antineoplastic radiation therapy: Secondary | ICD-10-CM | POA: Diagnosis not present

## 2017-03-26 DIAGNOSIS — Z79899 Other long term (current) drug therapy: Secondary | ICD-10-CM | POA: Diagnosis not present

## 2017-03-26 DIAGNOSIS — M545 Low back pain: Secondary | ICD-10-CM | POA: Diagnosis not present

## 2017-03-26 DIAGNOSIS — M797 Fibromyalgia: Secondary | ICD-10-CM | POA: Diagnosis not present

## 2017-03-26 DIAGNOSIS — I129 Hypertensive chronic kidney disease with stage 1 through stage 4 chronic kidney disease, or unspecified chronic kidney disease: Secondary | ICD-10-CM | POA: Diagnosis not present

## 2017-03-26 DIAGNOSIS — N183 Chronic kidney disease, stage 3 (moderate): Secondary | ICD-10-CM | POA: Diagnosis not present

## 2017-03-26 DIAGNOSIS — C250 Malignant neoplasm of head of pancreas: Secondary | ICD-10-CM | POA: Diagnosis not present

## 2017-03-26 DIAGNOSIS — Z794 Long term (current) use of insulin: Secondary | ICD-10-CM | POA: Diagnosis not present

## 2017-03-26 DIAGNOSIS — M179 Osteoarthritis of knee, unspecified: Secondary | ICD-10-CM | POA: Diagnosis not present

## 2017-03-26 DIAGNOSIS — G8929 Other chronic pain: Secondary | ICD-10-CM | POA: Diagnosis not present

## 2017-03-26 DIAGNOSIS — Z85828 Personal history of other malignant neoplasm of skin: Secondary | ICD-10-CM | POA: Diagnosis not present

## 2017-03-26 DIAGNOSIS — Z981 Arthrodesis status: Secondary | ICD-10-CM | POA: Diagnosis not present

## 2017-03-26 DIAGNOSIS — E1122 Type 2 diabetes mellitus with diabetic chronic kidney disease: Secondary | ICD-10-CM | POA: Diagnosis not present

## 2017-03-27 ENCOUNTER — Ambulatory Visit
Admission: RE | Admit: 2017-03-27 | Discharge: 2017-03-27 | Disposition: A | Discharge status: Home / Self Care | Payer: Medicare Other | Source: Ambulatory Visit | Attending: Radiation Oncology | Admitting: Radiation Oncology

## 2017-03-27 DIAGNOSIS — Z510 Encounter for antineoplastic radiation therapy: Secondary | ICD-10-CM | POA: Diagnosis not present

## 2017-03-27 DIAGNOSIS — N183 Chronic kidney disease, stage 3 (moderate): Secondary | ICD-10-CM | POA: Diagnosis not present

## 2017-03-27 DIAGNOSIS — M797 Fibromyalgia: Secondary | ICD-10-CM | POA: Diagnosis not present

## 2017-03-27 DIAGNOSIS — G8929 Other chronic pain: Secondary | ICD-10-CM | POA: Diagnosis not present

## 2017-03-27 DIAGNOSIS — I129 Hypertensive chronic kidney disease with stage 1 through stage 4 chronic kidney disease, or unspecified chronic kidney disease: Secondary | ICD-10-CM | POA: Diagnosis not present

## 2017-03-27 DIAGNOSIS — M545 Low back pain: Secondary | ICD-10-CM | POA: Diagnosis not present

## 2017-03-27 DIAGNOSIS — Z79899 Other long term (current) drug therapy: Secondary | ICD-10-CM | POA: Diagnosis not present

## 2017-03-27 DIAGNOSIS — M179 Osteoarthritis of knee, unspecified: Secondary | ICD-10-CM | POA: Diagnosis not present

## 2017-03-27 DIAGNOSIS — Z981 Arthrodesis status: Secondary | ICD-10-CM | POA: Diagnosis not present

## 2017-03-27 DIAGNOSIS — Z85828 Personal history of other malignant neoplasm of skin: Secondary | ICD-10-CM | POA: Diagnosis not present

## 2017-03-27 DIAGNOSIS — C250 Malignant neoplasm of head of pancreas: Secondary | ICD-10-CM | POA: Diagnosis not present

## 2017-03-27 DIAGNOSIS — E1122 Type 2 diabetes mellitus with diabetic chronic kidney disease: Secondary | ICD-10-CM | POA: Diagnosis not present

## 2017-03-27 DIAGNOSIS — Z96652 Presence of left artificial knee joint: Secondary | ICD-10-CM | POA: Diagnosis not present

## 2017-03-27 DIAGNOSIS — Z794 Long term (current) use of insulin: Secondary | ICD-10-CM | POA: Diagnosis not present

## 2017-03-30 ENCOUNTER — Ambulatory Visit
Admission: RE | Admit: 2017-03-30 | Discharge: 2017-03-30 | Disposition: A | Discharge status: Home / Self Care | Payer: Medicare Other | Source: Ambulatory Visit | Attending: Radiation Oncology | Admitting: Radiation Oncology

## 2017-03-30 DIAGNOSIS — Z79899 Other long term (current) drug therapy: Secondary | ICD-10-CM | POA: Diagnosis not present

## 2017-03-30 DIAGNOSIS — Z96652 Presence of left artificial knee joint: Secondary | ICD-10-CM | POA: Diagnosis not present

## 2017-03-30 DIAGNOSIS — Z85828 Personal history of other malignant neoplasm of skin: Secondary | ICD-10-CM | POA: Diagnosis not present

## 2017-03-30 DIAGNOSIS — M545 Low back pain: Secondary | ICD-10-CM | POA: Diagnosis not present

## 2017-03-30 DIAGNOSIS — N183 Chronic kidney disease, stage 3 (moderate): Secondary | ICD-10-CM | POA: Diagnosis not present

## 2017-03-30 DIAGNOSIS — I129 Hypertensive chronic kidney disease with stage 1 through stage 4 chronic kidney disease, or unspecified chronic kidney disease: Secondary | ICD-10-CM | POA: Diagnosis not present

## 2017-03-30 DIAGNOSIS — M797 Fibromyalgia: Secondary | ICD-10-CM | POA: Diagnosis not present

## 2017-03-30 DIAGNOSIS — G8929 Other chronic pain: Secondary | ICD-10-CM | POA: Diagnosis not present

## 2017-03-30 DIAGNOSIS — M179 Osteoarthritis of knee, unspecified: Secondary | ICD-10-CM | POA: Diagnosis not present

## 2017-03-30 DIAGNOSIS — C250 Malignant neoplasm of head of pancreas: Secondary | ICD-10-CM | POA: Diagnosis not present

## 2017-03-30 DIAGNOSIS — Z794 Long term (current) use of insulin: Secondary | ICD-10-CM | POA: Diagnosis not present

## 2017-03-30 DIAGNOSIS — E1122 Type 2 diabetes mellitus with diabetic chronic kidney disease: Secondary | ICD-10-CM | POA: Diagnosis not present

## 2017-03-30 DIAGNOSIS — Z510 Encounter for antineoplastic radiation therapy: Secondary | ICD-10-CM | POA: Diagnosis not present

## 2017-03-30 DIAGNOSIS — Z981 Arthrodesis status: Secondary | ICD-10-CM | POA: Diagnosis not present

## 2017-03-31 ENCOUNTER — Ambulatory Visit
Admission: RE | Admit: 2017-03-31 | Discharge: 2017-03-31 | Disposition: A | Discharge status: Home / Self Care | Payer: Medicare Other | Source: Ambulatory Visit | Attending: Radiation Oncology | Admitting: Radiation Oncology

## 2017-03-31 ENCOUNTER — Ambulatory Visit (HOSPITAL_BASED_OUTPATIENT_CLINIC_OR_DEPARTMENT_OTHER): Payer: Medicare Other | Admitting: Nurse Practitioner

## 2017-03-31 ENCOUNTER — Other Ambulatory Visit (HOSPITAL_BASED_OUTPATIENT_CLINIC_OR_DEPARTMENT_OTHER): Payer: Medicare Other

## 2017-03-31 ENCOUNTER — Ambulatory Visit (HOSPITAL_BASED_OUTPATIENT_CLINIC_OR_DEPARTMENT_OTHER): Payer: Medicare Other

## 2017-03-31 VITALS — BP 168/76 | HR 95 | Temp 97.7°F | Resp 18 | Ht 63.0 in | Wt 156.3 lb

## 2017-03-31 DIAGNOSIS — G8929 Other chronic pain: Secondary | ICD-10-CM | POA: Diagnosis not present

## 2017-03-31 DIAGNOSIS — Z981 Arthrodesis status: Secondary | ICD-10-CM | POA: Diagnosis not present

## 2017-03-31 DIAGNOSIS — E1122 Type 2 diabetes mellitus with diabetic chronic kidney disease: Secondary | ICD-10-CM | POA: Diagnosis not present

## 2017-03-31 DIAGNOSIS — I129 Hypertensive chronic kidney disease with stage 1 through stage 4 chronic kidney disease, or unspecified chronic kidney disease: Secondary | ICD-10-CM | POA: Diagnosis not present

## 2017-03-31 DIAGNOSIS — Z510 Encounter for antineoplastic radiation therapy: Secondary | ICD-10-CM | POA: Diagnosis not present

## 2017-03-31 DIAGNOSIS — M545 Low back pain: Secondary | ICD-10-CM | POA: Diagnosis not present

## 2017-03-31 DIAGNOSIS — C250 Malignant neoplasm of head of pancreas: Secondary | ICD-10-CM

## 2017-03-31 DIAGNOSIS — Z96652 Presence of left artificial knee joint: Secondary | ICD-10-CM | POA: Diagnosis not present

## 2017-03-31 DIAGNOSIS — Z85828 Personal history of other malignant neoplasm of skin: Secondary | ICD-10-CM | POA: Diagnosis not present

## 2017-03-31 DIAGNOSIS — Z794 Long term (current) use of insulin: Secondary | ICD-10-CM | POA: Diagnosis not present

## 2017-03-31 DIAGNOSIS — R110 Nausea: Secondary | ICD-10-CM

## 2017-03-31 DIAGNOSIS — M179 Osteoarthritis of knee, unspecified: Secondary | ICD-10-CM | POA: Diagnosis not present

## 2017-03-31 DIAGNOSIS — N183 Chronic kidney disease, stage 3 (moderate): Secondary | ICD-10-CM | POA: Diagnosis not present

## 2017-03-31 DIAGNOSIS — M797 Fibromyalgia: Secondary | ICD-10-CM | POA: Diagnosis not present

## 2017-03-31 DIAGNOSIS — Z79899 Other long term (current) drug therapy: Secondary | ICD-10-CM | POA: Diagnosis not present

## 2017-03-31 LAB — CBC WITH DIFFERENTIAL/PLATELET
BASO%: 0.3 % (ref 0.0–2.0)
Basophils Absolute: 0 10*3/uL (ref 0.0–0.1)
EOS ABS: 0.2 10*3/uL (ref 0.0–0.5)
EOS%: 3.4 % (ref 0.0–7.0)
HCT: 26.9 % — ABNORMAL LOW (ref 34.8–46.6)
HEMOGLOBIN: 8.9 g/dL — AB (ref 11.6–15.9)
LYMPH%: 3.6 % — ABNORMAL LOW (ref 14.0–49.7)
MCH: 28.6 pg (ref 25.1–34.0)
MCHC: 33.1 g/dL (ref 31.5–36.0)
MCV: 86.6 fL (ref 79.5–101.0)
MONO#: 0.5 10*3/uL (ref 0.1–0.9)
MONO%: 10.5 % (ref 0.0–14.0)
NEUT%: 82.2 % — ABNORMAL HIGH (ref 38.4–76.8)
NEUTROS ABS: 3.8 10*3/uL (ref 1.5–6.5)
PLATELETS: 230 10*3/uL (ref 145–400)
RBC: 3.11 10*6/uL — ABNORMAL LOW (ref 3.70–5.45)
RDW: 23.8 % — AB (ref 11.2–14.5)
WBC: 4.6 10*3/uL (ref 3.9–10.3)
lymph#: 0.2 10*3/uL — ABNORMAL LOW (ref 0.9–3.3)

## 2017-03-31 MED ORDER — HEPARIN SOD (PORK) LOCK FLUSH 100 UNIT/ML IV SOLN
500.0000 [IU] | Freq: Once | INTRAVENOUS | Status: AC
  Administered 2017-03-31: 500 [IU] via INTRAVENOUS
  Filled 2017-03-31: qty 5

## 2017-03-31 MED ORDER — SODIUM CHLORIDE 0.9 % IJ SOLN
10.0000 mL | Freq: Once | INTRAMUSCULAR | Status: AC
  Administered 2017-03-31: 10 mL via INTRAVENOUS
  Filled 2017-03-31: qty 10

## 2017-03-31 MED ORDER — DIPHENOXYLATE-ATROPINE 2.5-0.025 MG PO TABS: 2.0000 | ORAL_TABLET | Freq: Four times a day (QID) | ORAL | 0 refills | Status: AC | PRN

## 2017-03-31 NOTE — Progress Notes (Signed)
  [facility] OFFICE PROGRESS NOTE   Diagnosis:  Pancreas cancer  INTERVAL HISTORY:   Patricia Austin returns as scheduled. She continues radiation/Xeloda. Nausea is better. No mouth sores. She reports baseline bowel habits of loose stools prior to beginning radiation/Xeloda. This has increased. She reports no more than 5 loose stools a day. Imodium is intermittently effective. No hand or foot pain or redness. Pain is better since the kyphoplasty procedure.  Objective:  Vital signs in last 24 hours:  Blood pressure (!) 168/76, pulse 95, temperature 97.7 F (36.5 C), temperature source Oral, resp. rate 18, height 5\' 3"  (1.6 m), weight 156 lb 4.8 oz (70.9 kg), SpO2 100 %.    HEENT: No thrush or ulcers. Resp: Lungs clear bilaterally. Cardio: Regular rate and rhythm. GI: Abdomen soft and nontender. No hepatomegaly. Vascular: Trace lower leg edema bilaterally. Skin: Palms without erythema. Port-A-Cath without erythema.    Lab Results:  Lab Results  Component Value Date   WBC 4.6 03/31/2017   HGB 8.9 (L) 03/31/2017   HCT 26.9 (L) 03/31/2017   MCV 86.6 03/31/2017   PLT 230 03/31/2017   NEUTROABS 3.8 03/31/2017    Imaging:  No results found.  Medications: I have reviewed the patient's current medications.  Assessment/Plan: 1. Adenocarcinoma the pancreas-pancreas head/uncinatemass noted on a CT abdomen/pelvis 09/20/2016, status post an EUS biopsy 10/09/2016 confirming adenocarcinoma  CT 09/20/2016-pancreas head/uncinatemass, possible contact with the superior mesenteric artery, biliary dilatation, small low lung nodules  CTs chest, abdomen, and pelvis 10/27/2016-enlarging pancreas head mass with vascular abutment, no evidence of metastatic disease  Cycle 1 gemcitabine/Abraxane 11/25/2016  Cycle 2 gemcitabine/Abraxane 12/08/2016  Cycle 3 gemcitabine/Abraxane 12/22/2016  Cycle 4 gemcitabine/Abraxane 01/13/2017  Cycle 5 gemcitabine/Abraxane 01/26/2017  CT  02/04/2017-stable pancreas mass with abutment of the aorta, superior mesenteric artery, portal vein, and superior Mr. brain. No evidence of liver metastases. Stable right lower lobe nodule.  Radiation/Xeloda-beginning09/10/2016  2. Biliary obstruction secondary to #1, status post placement of a wall stent and external biliary drain 10/09/2016  Biliary drain removed 10/20/2016  3. Diabetes-admitted with marked hyperglycemia 10/27/2016  4. History of squamous cell carcinoma of the scalp  5. Lumbar fusion surgery February 2018  6. 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 History of Abdominal/back pain secondary to pancreas cancer  8. Cholangitis 10/07/2016  9. Confusionduring hospital admission 10/27/2016-etiology unclear, MRI brain negative for metastatic disease on 10/29/2016, improved  10. Port-A-Cath placement 11/19/2016  11. Admission 12/30/2016 with dyspnea, chest CT consistent with pulmonary edema  12. Right leg weakness-MRI lumbar spine August 2018-no evidence of metastatic disease; superior endplate fracture at T90. Loss of height 10%. No retropulsed bone. Consistent with a benign osteoporotic fracture.  T12 kyphoplasty 03/11/2017    Disposition: Patricia Austin appears unchanged. She continues radiation/Xeloda. She is scheduled to complete the course of radiation 04/10/2017.   The nausea she was experiencing at the time her last visit is better. She is having loose stools. She will try Lomotil. She understands to contact the office with poorly controlled diarrhea. She will return for a follow-up visit in 3 weeks.  Plan reviewed with Dr. Benay Spice.`    Ned Card ANP/GNP-BC   03/31/2017  3:20 PM

## 2017-04-01 ENCOUNTER — Telehealth: Payer: Self-pay | Admitting: *Deleted

## 2017-04-01 ENCOUNTER — Telehealth: Payer: Self-pay | Admitting: Oncology

## 2017-04-01 ENCOUNTER — Ambulatory Visit: Payer: Medicare Other

## 2017-04-01 ENCOUNTER — Telehealth: Payer: Self-pay

## 2017-04-01 NOTE — Telephone Encounter (Signed)
Spoke with patient's husband and regarding his wife's diarrhea. Pt's husband states that his wife has had 2 or 3 loose stools today. Informed pt's husband to be sure to take both the imodium and the lomotil as directed as often as possible. Informed husband to give the medication another 24 hours to work and to contact [facility] if she is still having issues with diarrhea. Pt's husband verbalized understanding and agreed with plan of care.

## 2017-04-01 NOTE — Telephone Encounter (Signed)
Spoke with patient's husband regarding her appts on 10/31 that were added per 10/9 los

## 2017-04-01 NOTE — Telephone Encounter (Signed)
Called patient hme, spoke with spouse, patient cancelled today had  Diarrhea last night and too week today for rad tx, he did pick up rx lomotil and has started to give it to his wife, they plan on coming for radiation tomorrow, thankd this RN for calling and checking on the patient 3:00 PM

## 2017-04-02 ENCOUNTER — Ambulatory Visit: Payer: Medicare Other

## 2017-04-03 ENCOUNTER — Ambulatory Visit: Payer: Medicare Other

## 2017-04-03 ENCOUNTER — Telehealth: Payer: Self-pay | Admitting: *Deleted

## 2017-04-03 NOTE — Telephone Encounter (Signed)
Returned call to spouse ,cancelled todays rad tx,patient still too weak, but improving , weather wise didn't want to come either,, they have power, will be in on Monday 12:20 PM  12:20 PM

## 2017-04-06 ENCOUNTER — Inpatient Hospital Stay (HOSPITAL_COMMUNITY)
Admission: AD | Admit: 2017-04-06 | Discharge: 2017-04-09 | DRG: 380 | Disposition: A | Discharge status: Home / Self Care | Payer: Medicare Other | Source: Ambulatory Visit | Attending: Internal Medicine | Admitting: Internal Medicine

## 2017-04-06 ENCOUNTER — Ambulatory Visit: Payer: Medicare Other

## 2017-04-06 ENCOUNTER — Emergency Department (HOSPITAL_COMMUNITY): Payer: Medicare Other

## 2017-04-06 ENCOUNTER — Telehealth: Payer: Self-pay | Admitting: Emergency Medicine

## 2017-04-06 ENCOUNTER — Encounter (HOSPITAL_COMMUNITY): Payer: Self-pay | Admitting: *Deleted

## 2017-04-06 ENCOUNTER — Ambulatory Visit (HOSPITAL_BASED_OUTPATIENT_CLINIC_OR_DEPARTMENT_OTHER): Payer: Medicare Other | Admitting: Nurse Practitioner

## 2017-04-06 ENCOUNTER — Encounter (HOSPITAL_COMMUNITY): Payer: Self-pay | Admitting: Emergency Medicine

## 2017-04-06 ENCOUNTER — Emergency Department (HOSPITAL_COMMUNITY)
Admission: EM | Admit: 2017-04-06 | Discharge: 2017-04-06 | Disposition: A | Discharge status: Home / Self Care | Payer: Medicare Other | Source: Home / Self Care | Attending: Physician Assistant | Admitting: Physician Assistant

## 2017-04-06 VITALS — BP 115/63 | HR 108 | Temp 98.6°F | Resp 18

## 2017-04-06 DIAGNOSIS — E119 Type 2 diabetes mellitus without complications: Secondary | ICD-10-CM

## 2017-04-06 DIAGNOSIS — F419 Anxiety disorder, unspecified: Secondary | ICD-10-CM | POA: Diagnosis present

## 2017-04-06 DIAGNOSIS — Z9221 Personal history of antineoplastic chemotherapy: Secondary | ICD-10-CM

## 2017-04-06 DIAGNOSIS — K315 Obstruction of duodenum: Secondary | ICD-10-CM | POA: Diagnosis not present

## 2017-04-06 DIAGNOSIS — D500 Iron deficiency anemia secondary to blood loss (chronic): Secondary | ICD-10-CM | POA: Diagnosis present

## 2017-04-06 DIAGNOSIS — Z96652 Presence of left artificial knee joint: Secondary | ICD-10-CM | POA: Diagnosis not present

## 2017-04-06 DIAGNOSIS — C250 Malignant neoplasm of head of pancreas: Secondary | ICD-10-CM | POA: Diagnosis present

## 2017-04-06 DIAGNOSIS — K2980 Duodenitis without bleeding: Secondary | ICD-10-CM | POA: Diagnosis not present

## 2017-04-06 DIAGNOSIS — Z6827 Body mass index (BMI) 27.0-27.9, adult: Secondary | ICD-10-CM

## 2017-04-06 DIAGNOSIS — R112 Nausea with vomiting, unspecified: Secondary | ICD-10-CM | POA: Diagnosis not present

## 2017-04-06 DIAGNOSIS — I10 Essential (primary) hypertension: Secondary | ICD-10-CM | POA: Diagnosis present

## 2017-04-06 DIAGNOSIS — E43 Unspecified severe protein-calorie malnutrition: Secondary | ICD-10-CM | POA: Diagnosis not present

## 2017-04-06 DIAGNOSIS — K219 Gastro-esophageal reflux disease without esophagitis: Secondary | ICD-10-CM | POA: Diagnosis present

## 2017-04-06 DIAGNOSIS — N183 Chronic kidney disease, stage 3 (moderate): Secondary | ICD-10-CM | POA: Diagnosis present

## 2017-04-06 DIAGNOSIS — R159 Full incontinence of feces: Secondary | ICD-10-CM | POA: Diagnosis not present

## 2017-04-06 DIAGNOSIS — R110 Nausea: Secondary | ICD-10-CM

## 2017-04-06 DIAGNOSIS — K520 Gastroenteritis and colitis due to radiation: Secondary | ICD-10-CM | POA: Diagnosis not present

## 2017-04-06 DIAGNOSIS — D638 Anemia in other chronic diseases classified elsewhere: Secondary | ICD-10-CM | POA: Diagnosis present

## 2017-04-06 DIAGNOSIS — K224 Dyskinesia of esophagus: Secondary | ICD-10-CM | POA: Diagnosis not present

## 2017-04-06 DIAGNOSIS — E1122 Type 2 diabetes mellitus with diabetic chronic kidney disease: Secondary | ICD-10-CM | POA: Diagnosis not present

## 2017-04-06 DIAGNOSIS — R55 Syncope and collapse: Secondary | ICD-10-CM | POA: Diagnosis not present

## 2017-04-06 DIAGNOSIS — I129 Hypertensive chronic kidney disease with stage 1 through stage 4 chronic kidney disease, or unspecified chronic kidney disease: Secondary | ICD-10-CM | POA: Diagnosis not present

## 2017-04-06 DIAGNOSIS — Z981 Arthrodesis status: Secondary | ICD-10-CM

## 2017-04-06 DIAGNOSIS — K521 Toxic gastroenteritis and colitis: Secondary | ICD-10-CM

## 2017-04-06 DIAGNOSIS — K311 Adult hypertrophic pyloric stenosis: Secondary | ICD-10-CM | POA: Diagnosis not present

## 2017-04-06 DIAGNOSIS — C259 Malignant neoplasm of pancreas, unspecified: Secondary | ICD-10-CM | POA: Diagnosis not present

## 2017-04-06 DIAGNOSIS — E876 Hypokalemia: Secondary | ICD-10-CM

## 2017-04-06 DIAGNOSIS — R197 Diarrhea, unspecified: Secondary | ICD-10-CM | POA: Diagnosis not present

## 2017-04-06 DIAGNOSIS — F329 Major depressive disorder, single episode, unspecified: Secondary | ICD-10-CM | POA: Diagnosis present

## 2017-04-06 DIAGNOSIS — M797 Fibromyalgia: Secondary | ICD-10-CM | POA: Diagnosis not present

## 2017-04-06 DIAGNOSIS — E870 Hyperosmolality and hypernatremia: Secondary | ICD-10-CM | POA: Diagnosis present

## 2017-04-06 DIAGNOSIS — Z923 Personal history of irradiation: Secondary | ICD-10-CM

## 2017-04-06 DIAGNOSIS — E1165 Type 2 diabetes mellitus with hyperglycemia: Secondary | ICD-10-CM | POA: Diagnosis not present

## 2017-04-06 DIAGNOSIS — Y842 Radiological procedure and radiotherapy as the cause of abnormal reaction of the patient, or of later complication, without mention of misadventure at the time of the procedure: Secondary | ICD-10-CM | POA: Diagnosis present

## 2017-04-06 DIAGNOSIS — Z85828 Personal history of other malignant neoplasm of skin: Secondary | ICD-10-CM | POA: Diagnosis not present

## 2017-04-06 DIAGNOSIS — R933 Abnormal findings on diagnostic imaging of other parts of digestive tract: Secondary | ICD-10-CM | POA: Diagnosis not present

## 2017-04-06 DIAGNOSIS — Z794 Long term (current) use of insulin: Secondary | ICD-10-CM

## 2017-04-06 DIAGNOSIS — C787 Secondary malignant neoplasm of liver and intrahepatic bile duct: Secondary | ICD-10-CM | POA: Diagnosis not present

## 2017-04-06 DIAGNOSIS — Z79899 Other long term (current) drug therapy: Secondary | ICD-10-CM

## 2017-04-06 LAB — CBC
HCT: 27 % — ABNORMAL LOW (ref 36.0–46.0)
HEMATOCRIT: 27.5 % — AB (ref 36.0–46.0)
HEMOGLOBIN: 9.2 g/dL — AB (ref 12.0–15.0)
HEMOGLOBIN: 9.4 g/dL — AB (ref 12.0–15.0)
MCH: 29.2 pg (ref 26.0–34.0)
MCH: 29.4 pg (ref 26.0–34.0)
MCHC: 34.1 g/dL (ref 30.0–36.0)
MCHC: 34.2 g/dL (ref 30.0–36.0)
MCV: 85.7 fL (ref 78.0–100.0)
MCV: 85.9 fL (ref 78.0–100.0)
Platelets: 241 10*3/uL (ref 150–400)
Platelets: 306 10*3/uL (ref 150–400)
RBC: 3.15 MIL/uL — AB (ref 3.87–5.11)
RBC: 3.2 MIL/uL — AB (ref 3.87–5.11)
RDW: 21.1 % — ABNORMAL HIGH (ref 11.5–15.5)
RDW: 21.4 % — ABNORMAL HIGH (ref 11.5–15.5)
WBC: 5.6 10*3/uL (ref 4.0–10.5)
WBC: 7.1 10*3/uL (ref 4.0–10.5)

## 2017-04-06 LAB — CREATININE, SERUM
CREATININE: 1.14 mg/dL — AB (ref 0.44–1.00)
GFR, EST AFRICAN AMERICAN: 54 mL/min — AB (ref >60)
GFR, EST NON AFRICAN AMERICAN: 47 mL/min — AB (ref >60)

## 2017-04-06 LAB — COMPREHENSIVE METABOLIC PANEL
ALK PHOS: 158 U/L — AB (ref 38–126)
ALT: 14 U/L (ref 14–54)
ANION GAP: 8 (ref 5–15)
AST: 31 U/L (ref 15–41)
Albumin: 2.2 g/dL — ABNORMAL LOW (ref 3.5–5.0)
BILIRUBIN TOTAL: 0.4 mg/dL (ref 0.3–1.2)
BUN: 14 mg/dL (ref 6–20)
CALCIUM: 8.7 mg/dL — AB (ref 8.9–10.3)
CO2: 29 mmol/L (ref 22–32)
Chloride: 102 mmol/L (ref 101–111)
Creatinine, Ser: 1.13 mg/dL — ABNORMAL HIGH (ref 0.44–1.00)
GFR, EST AFRICAN AMERICAN: 54 mL/min — AB (ref >60)
GFR, EST NON AFRICAN AMERICAN: 47 mL/min — AB (ref >60)
GLUCOSE: 119 mg/dL — AB (ref 65–99)
Potassium: 2.8 mmol/L — ABNORMAL LOW (ref 3.5–5.1)
Sodium: 139 mmol/L (ref 135–145)
TOTAL PROTEIN: 5.3 g/dL — AB (ref 6.5–8.1)

## 2017-04-06 LAB — GLUCOSE, CAPILLARY: GLUCOSE-CAPILLARY: 118 mg/dL — AB (ref 65–99)

## 2017-04-06 LAB — PHOSPHORUS: PHOSPHORUS: 2.7 mg/dL (ref 2.5–4.6)

## 2017-04-06 LAB — MAGNESIUM: Magnesium: 1.4 mg/dL — ABNORMAL LOW (ref 1.7–2.4)

## 2017-04-06 LAB — LIPASE, BLOOD: Lipase: 14 U/L (ref 11–51)

## 2017-04-06 MED ORDER — SODIUM CHLORIDE 0.9 % IV BOLUS (SEPSIS)
1000.0000 mL | Freq: Once | INTRAVENOUS | Status: AC
  Administered 2017-04-06: 1000 mL via INTRAVENOUS

## 2017-04-06 MED ORDER — PANTOPRAZOLE SODIUM 40 MG IV SOLR
40.0000 mg | INTRAVENOUS | Status: DC
  Administered 2017-04-06 – 2017-04-08 (×3): 40 mg via INTRAVENOUS
  Filled 2017-04-06 (×3): qty 40

## 2017-04-06 MED ORDER — KCL IN DEXTROSE-NACL 20-5-0.45 MEQ/L-%-% IV SOLN
INTRAVENOUS | Status: DC
  Administered 2017-04-06 – 2017-04-08 (×3): via INTRAVENOUS
  Filled 2017-04-06 (×4): qty 1000

## 2017-04-06 MED ORDER — POTASSIUM CHLORIDE 10 MEQ/100ML IV SOLN
10.0000 meq | Freq: Once | INTRAVENOUS | Status: AC
  Administered 2017-04-06: 10 meq via INTRAVENOUS
  Filled 2017-04-06: qty 100

## 2017-04-06 MED ORDER — ONDANSETRON HCL 4 MG/2ML IJ SOLN
4.0000 mg | Freq: Four times a day (QID) | INTRAMUSCULAR | Status: DC | PRN
  Administered 2017-04-06 (×2): 4 mg via INTRAVENOUS
  Filled 2017-04-06 (×2): qty 2

## 2017-04-06 MED ORDER — CARVEDILOL 25 MG PO TABS
25.0000 mg | ORAL_TABLET | Freq: Two times a day (BID) | ORAL | Status: DC
  Administered 2017-04-06 – 2017-04-08 (×4): 25 mg via ORAL
  Filled 2017-04-06 (×4): qty 1

## 2017-04-06 MED ORDER — INSULIN ASPART 100 UNIT/ML ~~LOC~~ SOLN
0.0000 [IU] | Freq: Three times a day (TID) | SUBCUTANEOUS | Status: DC
  Administered 2017-04-07 (×2): 2 [IU] via SUBCUTANEOUS
  Administered 2017-04-08 (×2): 1 [IU] via SUBCUTANEOUS
  Administered 2017-04-09: 2 [IU] via SUBCUTANEOUS

## 2017-04-06 MED ORDER — ONDANSETRON HCL 4 MG/2ML IJ SOLN
4.0000 mg | Freq: Once | INTRAMUSCULAR | Status: AC
  Administered 2017-04-06: 4 mg via INTRAVENOUS
  Filled 2017-04-06: qty 2

## 2017-04-06 MED ORDER — IOPAMIDOL (ISOVUE-300) INJECTION 61%
INTRAVENOUS | Status: AC
  Filled 2017-04-06: qty 100

## 2017-04-06 MED ORDER — ZOLPIDEM TARTRATE 5 MG PO TABS
5.0000 mg | ORAL_TABLET | Freq: Every evening | ORAL | Status: DC | PRN
  Administered 2017-04-06 – 2017-04-07 (×2): 5 mg via ORAL
  Filled 2017-04-06 (×2): qty 1

## 2017-04-06 MED ORDER — ONDANSETRON HCL 4 MG PO TABS: 4.0000 mg | ORAL_TABLET | Freq: Four times a day (QID) | ORAL | Status: DC | PRN

## 2017-04-06 MED ORDER — OXYCODONE HCL 5 MG PO TABS
10.0000 mg | ORAL_TABLET | Freq: Once | ORAL | Status: AC
  Administered 2017-04-06: 10 mg via ORAL
  Filled 2017-04-06: qty 2

## 2017-04-06 MED ORDER — POTASSIUM CHLORIDE 10 MEQ/100ML IV SOLN
10.0000 meq | INTRAVENOUS | Status: AC
  Administered 2017-04-06 (×4): 10 meq via INTRAVENOUS
  Filled 2017-04-06 (×4): qty 100

## 2017-04-06 MED ORDER — SODIUM CHLORIDE 0.9% FLUSH
10.0000 mL | INTRAVENOUS | Status: DC | PRN
  Administered 2017-04-08: 10 mL
  Administered 2017-04-09: 20 mL
  Filled 2017-04-06 (×2): qty 40

## 2017-04-06 MED ORDER — DULOXETINE HCL 60 MG PO CPEP
60.0000 mg | ORAL_CAPSULE | Freq: Every day | ORAL | Status: DC
  Administered 2017-04-06 – 2017-04-09 (×4): 60 mg via ORAL
  Filled 2017-04-06 (×4): qty 1

## 2017-04-06 MED ORDER — HEPARIN SODIUM (PORCINE) 5000 UNIT/ML IJ SOLN
5000.0000 [IU] | Freq: Three times a day (TID) | INTRAMUSCULAR | Status: DC
  Administered 2017-04-06 – 2017-04-07 (×3): 5000 [IU] via SUBCUTANEOUS
  Filled 2017-04-06 (×3): qty 1

## 2017-04-06 MED ORDER — IOPAMIDOL (ISOVUE-300) INJECTION 61%
100.0000 mL | Freq: Once | INTRAVENOUS | Status: AC | PRN
  Administered 2017-04-06: 100 mL via INTRAVENOUS

## 2017-04-06 MED ORDER — INSULIN ASPART 100 UNIT/ML ~~LOC~~ SOLN: 0.0000 [IU] | Freq: Every day | SUBCUTANEOUS | Status: DC

## 2017-04-06 NOTE — Discharge Instructions (Signed)
We talked to your doctor, your oncologist. They want you to report to the lobby at the cancer center for the Oncology Symptom Management Clinic.

## 2017-04-06 NOTE — H&P (Signed)
History and Physical    [name] Hacking OZD:[id number] DOB: 13-Oct-1945 DOA: 04/06/2017  PCP: Chevis Pretty, FNP  Patient coming from: oncology office  Chief Complaint:   HPI: Patricia Austin is a 71 y.o. female with medical history significant of Pancreatic cancer now with metastasis to liver and growing pancreatic head impinging on duodenum. Pt reports persistent nausea and emesis with difficulty eating/tolerating food. Has not eaten well for the last 1 week. Problems has been persistent and getting worse. Patient was seen at the oncologist office and transitioned here for symptom management and GI consult for further recommendations regarding gastric outlet issue at duodenum.   Review of Systems: As per HPI otherwise 10 point review of systems negative.    Past Medical History:  Diagnosis Date  . Anxiety    "not all the time"   . Chronic lower back pain   . CKD (chronic kidney disease) stage 3, GFR 30-59 ml/min (HCC)   . Complication of anesthesia 2002   difficulty /w intubation, with plastic surgery , because she was feeling so anxious. 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 .   Pt. reports that she  is very sensitive to med. & is hard  to wake up.  . Depression    "not all the time"   . Difficult intubation 2002   with head surgery 2002 has done ok with later intubations  . Dysrhythmia    told that she sometimes has extra beats. Does not see cardiologist, had a stress test many yrs. ago>10 yrs. ...update 11-14-16 " i havent had that feelign in a long time "  . Esophageal dysmotility    "getting worse recenly" (12/15/2013)  . Fibromyalgia   . GERD (gastroesophageal reflux disease)   . Hypertension   . Nephritis ~ 1955  . Osteoarthritis    knees   . Pancreatic cancer ([place])   . Pancreatic mass   . Renal insufficiency   . Seasonal allergies    "in the spring"  . Shortness of breath    walking long distances   . squamous cell carcinoma scalp    squamous, on scalp, &  has had several surgeries on scalp, for plastics repair of scalp   . Type II diabetes mellitus ([place])    dx 1970"s    Past Surgical History:  Procedure Laterality Date  . ABDOMINAL HYSTERECTOMY     complete  . APPENDECTOMY    . BACK SURGERY  07/2016   L 4 and L 5 fusion done [facility]   . CARPAL TUNNEL RELEASE Right   . CARPAL TUNNEL RELEASE Left   . COLONOSCOPY    . CYST EXCISION  1990's?   scalp  . ESOPHAGOGASTRODUODENOSCOPY N/A 09/26/2016   Procedure: ESOPHAGOGASTRODUODENOSCOPY (EGD);  Surgeon: Gatha Mayer, MD;  Location: [facility] ENDOSCOPY;  Service: Endoscopy;  Laterality: N/A;  . EUS N/A 10/09/2016   Procedure: UPPER ENDOSCOPIC ULTRASOUND (EUS) LINEAR;  Surgeon: Milus Banister, MD;  Location: [facility] ENDOSCOPY;  Service: Endoscopy;  Laterality: N/A;  . IR BILIARY STENT(S) EXISTING ACCESS INC DILATION CATH EXCHANGE  10/09/2016  . IR CHOLANGIOGRAM EXISTING TUBE  10/15/2016  . IR FLUORO GUIDE PORT INSERTION RIGHT  11/19/2016  . IR INT EXT BILIARY DRAIN WITH CHOLANGIOGRAM  09/29/2016   2 gallstones  . IR REMOVAL BILIARY DRAIN  10/20/2016  . IR US GUIDE VASC ACCESS RIGHT  11/19/2016  . JOINT REPLACEMENT    . KYPHOPLASTY N/A 03/11/2017   Procedure: KYPHOPLASTY THORACIC TWELVE;  Surgeon: Ashok Pall, MD;  Location: [facility];  Service: Neurosurgery;  Laterality: N/A;  . LUMBAR DISC SURGERY  ~ 2012 X 2   double fusion ; last was 08-21-15  . RADIOLOGY WITH ANESTHESIA N/A 11/19/2016   Procedure: PORT PLACEMENT WITH GENERAL ANESTHESIA;  Surgeon: Arne Cleveland, MD;  Location: [facility] ORS;  Service: Anesthesiology;  Laterality: N/A;  . SKIN GRAFT  2002   scalp; "went to [facility] to repair OR earlier in the yearr"  . SQUAMOUS CELL CARCINOMA EXCISION  2002   "off scalp; cancerous area removed and stapled; Dr. Georgia Lopes"  . TONSILLECTOMY  age 71  . TOTAL KNEE ARTHROPLASTY Left 12/14/2013   Procedure: LEFT TOTAL KNEE ARTHROPLASTY;  Surgeon: Ninetta Lights, MD;  Location: [facility];  Service: Orthopedics;  Laterality: Left;   . TOTAL KNEE ARTHROPLASTY Left 12/15/2013     reports that she has never smoked. She has never used smokeless tobacco. She reports that she does not drink alcohol or use drugs.  No Known Allergies  Family History  Problem Relation Age of Onset  . Early death Mother   . Thrombosis Father     Prior to Admission medications   Medication Sig Start Date End Date Taking? Authorizing Provider  acetaminophen (TYLENOL) 500 MG tablet Take 500-1,000 mg by mouth every 6 (six) hours as needed for moderate pain.    [provider]  amLODipine (NORVASC) 5 MG tablet Take 1 tablet (5 mg total) by mouth daily. 11/24/16   Hassell Done, Mary-Margaret, FNP  capecitabine (XELODA) 500 MG tablet Take 3 tablets (1500mg ) by mouth BID, within 30 minutes of finishing a meal, on days of radiation only Patient taking differently: Take 1,500 mg by mouth See admin instructions. Take 3 tablets (1500mg ) by mouth BID, within 30 minutes of finishing a meal, on days of radiation only 02/20/17   Ladell Pier, MD  carvedilol (COREG) 25 MG tablet TAKE  (1)  TABLET TWICE A DAY WITH MEALS (BREAKFAST AND SUPPER) Patient taking differently: Take 25 mg by mouth 2 (two) times daily with a meal.  07/18/16   Hassell Done, Mary-Margaret, FNP  diphenoxylate-atropine (LOMOTIL) 2.5-0.025 MG tablet Take 2 tablets by mouth 4 (four) times daily as needed for diarrhea or loose stools. 03/31/17   Owens Shark, NP  DULoxetine (CYMBALTA) 60 MG capsule Take 1 capsule (60 mg total) by mouth daily. 01/01/17   Domenic Polite, MD  furosemide (LASIX) 20 MG tablet Take 1-2 tablets (20-40 mg total) by mouth daily. Take 40mg  daily for 3days then take 20mg  every other day Patient taking differently: Take 20 mg by mouth every other day.  01/01/17 01/01/18  Domenic Polite, MD  glucose blood (ONE TOUCH ULTRA TEST) test strip Test up to qid. DX:E11.65 11/13/16   Hassell Done, Mary-Margaret, FNP  HUMALOG KWIKPEN 100 UNIT/ML KiwkPen Inject 0-15 Units into the skin 3  (three) times daily. 03/05/17   [provider]  Insulin Glargine (TOUJEO SOLOSTAR) 300 UNIT/ML SOPN Inject 15 Units into the skin daily. Patient taking differently: Inject 25 Units into the skin at bedtime.  01/01/17   Domenic Polite, MD  insulin lispro (HUMALOG) 100 UNIT/ML injection Up to 15u with each meal 11/24/16   Hassell Done, Mary-Margaret, FNP  lidocaine-prilocaine (EMLA) cream Apply to port site one hour prior to use. Do not rub in. Cover with plastic. 11/10/16   Ladell Pier, MD  LORazepam (ATIVAN) 0.5 MG tablet Take 0.5 mg by mouth every 4 (four) hours as needed for anxiety.  [provider]  ondansetron (ZOFRAN) 8 MG tablet Take 1 tablet (8 mg total) by mouth every 8 (eight) hours as needed for nausea or vomiting. 02/17/17   Owens Shark, NP  Oxycodone HCl 10 MG TABS Take 1 tablet (10 mg total) by mouth every 4 (four) hours as needed. Patient taking differently: Take 10 mg by mouth every 4 (four) hours as needed (pain).  03/05/17   Hayden Pedro, PA-C  pantoprazole (PROTONIX) 40 MG tablet Take 1 tablet (40 mg total) by mouth daily before breakfast. 09/26/16   Gatha Mayer, MD  prochlorperazine (COMPAZINE) 10 MG tablet Take 1 tablet (10 mg total) by mouth every 6 (six) hours as needed for nausea or vomiting. 03/24/17   Hayden Pedro, PA-C    Physical Exam: Vitals:   04/06/17 1709  BP: (!) 153/70  Pulse: 100  Resp: 18  Temp: 98.5 F (36.9 C)  TempSrc: Oral  SpO2: 100%    Constitutional: NAD, calm, laying in bed Vitals:   04/06/17 1709  BP: (!) 153/70  Pulse: 100  Resp: 18  Temp: 98.5 F (36.9 C)  TempSrc: Oral  SpO2: 100%   Eyes: PERRL, lids and conjunctivae normal ENMT: Mucous membranes are dry. Posterior pharynx clear of any exudate or lesions.N Neck: normal, supple, no masses, no thyromegaly Respiratory: clear to auscultation bilaterally, no wheezing, no crackles. Normal respiratory effort. No accessory muscle use.    Cardiovascular: Regular rate and rhythm, no murmurs / rubs / gallops. No extremity edema. 2+ pedal pulses.  Abdomen: no tenderness, no masses palpated. No hepatosplenomegaly. Bowel sounds positive.  Musculoskeletal: no clubbing / cyanosis. No joint deformity upper and lower extremities.  Skin: no rashes, lesions, ulcers. No induration, on limited exam. Neurologic:no facial asymmetry, answers questions appropriately, moves extremities spontaneously and equally. Psychiatric: Normal judgment and insight. Alert and oriented x 3. Normal mood.    Labs on Admission: I have personally reviewed following labs and imaging studies  CBC:  Recent Labs Lab 03/31/17 1241 04/06/17 0751  WBC 4.6 5.6  NEUTROABS 3.8  --   HGB 8.9* 9.2*  HCT 26.9* 27.0*  MCV 86.6 85.7  PLT 230 353   Basic Metabolic Panel:  Recent Labs Lab 04/06/17 0751  NA 139  K 2.8*  CL 102  CO2 29  GLUCOSE 119*  BUN 14  CREATININE 1.13*  CALCIUM 8.7*   GFR: Estimated Creatinine Clearance: 41.9 mL/min (A) (by C-G formula based on SCr of 1.13 mg/dL (H)). Liver Function Tests:  Recent Labs Lab 04/06/17 0751  AST 31  ALT 14  ALKPHOS 158*  BILITOT 0.4  PROT 5.3*  ALBUMIN 2.2*    Recent Labs Lab 04/06/17 0751  LIPASE 14   No results for input(s): AMMONIA in the last 168 hours. Coagulation Profile: No results for input(s): INR, PROTIME in the last 168 hours. Cardiac Enzymes: No results for input(s): CKTOTAL, CKMB, CKMBINDEX, TROPONINI in the last 168 hours. BNP (last 3 results) No results for input(s): PROBNP in the last 8760 hours. HbA1C: No results for input(s): HGBA1C in the last 72 hours. CBG: No results for input(s): GLUCAP in the last 168 hours. Lipid Profile: No results for input(s): CHOL, HDL, LDLCALC, TRIG, CHOLHDL, LDLDIRECT in the last 72 hours. Thyroid Function Tests: No results for input(s): TSH, T4TOTAL, FREET4, T3FREE, THYROIDAB in the last 72 hours. Anemia Panel: No results for  input(s): VITAMINB12, FOLATE, FERRITIN, TIBC, IRON, RETICCTPCT in the last 72 hours. Urine analysis:    Component  Value Date/Time   COLORURINE STRAW (A) 12/30/2016 2139   APPEARANCEUR CLEAR 12/30/2016 2139   LABSPEC 1.005 12/30/2016 2139   PHURINE 6.0 12/30/2016 2139   GLUCOSEU NEGATIVE 12/30/2016 2139   HGBUR NEGATIVE 12/30/2016 2139   BILIRUBINUR NEGATIVE 12/30/2016 2139   BILIRUBINUR negative 04/06/2013 [place] 12/30/2016 2139   PROTEINUR 30 (A) 12/30/2016 2139   UROBILINOGEN 1.0 12/06/2013 1244   NITRITE NEGATIVE 12/30/2016 2139   LEUKOCYTESUR NEGATIVE 12/30/2016 2139    Radiological Exams on Admission: Ct Abdomen Pelvis W Contrast  Result Date: 04/06/2017 CLINICAL DATA:  History of pancreas cancer.  Followup. EXAM: CT ABDOMEN AND PELVIS WITH CONTRAST TECHNIQUE: Multidetector CT imaging of the abdomen and pelvis was performed using the standard protocol following bolus administration of intravenous contrast. CONTRAST:  162mL ISOVUE-300 IOPAMIDOL (ISOVUE-300) INJECTION 61% COMPARISON:  02/04/2017 FINDINGS: Lower chest: No pleural effusion identified. Calcified granuloma identified in the left lower lobe. Multiple tiny nodules are identified in both lung bases. Index nodule in the right lower lobe measuring 4 mm, image 15 of series 6. Unchanged from previous exam. Hepatobiliary: There has been interval development of multiple low-attenuation lesions in the liver compatible with metastatic disease. Index lesion within segment 7 measures 1.2 cm, image 11 of series 2. Segment 8 liver lesion measures 1.8 cm, image 14 of series 2. Posterior right lobe of liver lesion measures 2.5 cm, image 15 of series 2. Stent is identified within the common bile duct. Pneumobilia is noted compatible with biliary patency. Pancreas: The mass involving the head of pancreas measures 5.7 x 4.5 by 5.0 cm. On the previous examination this measured 5.5 x 4.4 x 5.7 cm. Increase in mass effect upon the  proximal duodenum with progressive dilatation of the pylorus and descending portion of the duodenum. Encasement and narrowing of the superior mesenteric artery is identified. There is also partial encasement of the portal vein and portal venous confluence with significant ventral displacement of the portal vein and portal venous confluence. Spleen: Calcified granulomas in the spleen. Adrenals/Urinary Tract: The adrenal glands are normal. Unremarkable appearance of both kidneys. Urinary bladder appears normal. Stomach/Bowel: The stomach is normal. There is progressive dilatation of the proximal duodenum. No pathologic dilatation of the mid and distal small bowel loops. Edema involving the wall of the transverse colon is identified. Distal colonic diverticular noted without acute inflammation Vascular/Lymphatic: Aortic atherosclerosis. No aneurysm. Portacaval node is decreased in size from previous exam. Now 7 mm, image 23 of series 2. Previously 1.2 cm. No pelvic or inguinal adenopathy. Reproductive: Status post hysterectomy. No adnexal masses. Other: No abdominal wall hernia or abnormality. No abdominopelvic ascites. Musculoskeletal: No acute or significant osseous findings. IMPRESSION: 1. Interval progression of disease. 2. New multifocal liver metastases. 3. Similar appearance of small nodules identified within the lung bases. 4. Stable to slight increase in size of mass involving the head of pancreas. Progressive mass-effect upon the duodenum is identified. 5. Stable postsurgical changes involving L3 through L5. The T12 vertebra has been treated with bone cement. Electronically Signed   By: Kerby Moors M.D.   On: 04/06/2017 10:29    EKG: Independently reviewed. Sinus tachycardia no st elevations or depressions  Assessment/Plan Active Problems:   Malignant neoplasm of head of pancreas [facility]) - sent her from oncology office - continue to treat supportively. Cause of gastric outlet obstruction - Pt to  continue f/u with oncology - Patient does not wish daughter or son to know of her situation. Update husband  Diabetes ([place]) - Pt not eating well as such will d/c home insulin regimen and place on SSI    Hypertension - continue B blocker, may have to transition to IV should patient not tolerate pills    GERD (gastroesophageal reflux disease) - Place on PPI IV since she has been having emesis    Nausea and vomiting - Will treat supportively - most likely combination of gastric outlet obstruction from growing pancreatic mass impinging on duodenum and metastatic pancreatic cancer - place on clear liquid diet and MIVF    Hypokalemia - Most likely due to poor oral intake and emesis - replace with K IV - reassess with BMP next am. - hold lasix   DVT prophylaxis: Heparin Code Status: full Family Communication: discussed with patient. Updates to go to her husband not daughter or son per her request Disposition Plan: pending further work up and recommendations  Consults called: Placed page to [employer] [facility] since patient has been seen by Dr Hilarie Fredrickson 04/06/17 530 pm Admission status: observation   Velvet Bathe MD [facility] Pager [phone]  If 7PM-7AM, please contact night-coverage [email or web address] Password [facility]  04/06/2017, 5:39 PM

## 2017-04-06 NOTE — ED Notes (Signed)
Pt has been informed about UA. Does not have to use the restroom at this moment but will hit call button when able to provide urine sample.

## 2017-04-06 NOTE — Telephone Encounter (Signed)
Pt admitted into [facility] hospital 4east room 1405. Report called to accepting RN. pt escorted by Maudie Mercury, NT to admission office. pts port accessed upon admission. Pt A&O x4 while in cancer center. Vitals 115/63 HR 108. Afebrile. sats 99% on RA.

## 2017-04-06 NOTE — ED Notes (Signed)
Left message at symptom management clinic to see if pt can be sent with port accessed

## 2017-04-06 NOTE — ED Notes (Signed)
Husband left with patient, patient being transported to Symptom Management Clinic at the [facility] at this time. No acute distress noted upon discharge. All questions answered by RN and MD.

## 2017-04-06 NOTE — ED Provider Notes (Signed)
[facility] DEPT Provider Note   CSN: [id number] Arrival date & time: 04/06/17  2637     History   Chief Complaint Chief Complaint  Patient presents with  . Emesis  . Diarrhea    HPI Patricia Austin is a 71 y.o. female.  HPI   Patient 71 year old female with history of pancreatic cancer, on radiation and pill chemotherapy. Patient reports 5-6 days of nausea vomiting and occasional diarrhea. She reports vomiting 2-3 times a day. She reports that she stopped taking her chemotherapy agent 5-6 days ago. She thought that the chemotherapy agent was within causing her to vomit. Patient also expressed some disbelief in the efficacy of her chemotherapy pills.  Patient reports no belly pain. She's been able to take sips of fluids. She reports large volumes emesis.  Patient is due for radiation today but fell too ill so came here to the emergency department.  Past Medical History:  Diagnosis Date  . Anxiety    "not all the time"   . Chronic lower back pain   . CKD (chronic kidney disease) stage 3, GFR 30-59 ml/min (HCC)   . Complication of anesthesia 2002   difficulty /w intubation, with plastic surgery , because she was feeling so anxious. 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 .   Pt. reports that she  is very sensitive to med. & is hard  to wake up.  . Depression    "not all the time"   . Difficult intubation 2002   with head surgery 2002 has done ok with later intubations  . Dysrhythmia    told that she sometimes has extra beats. Does not see cardiologist, had a stress test many yrs. ago>10 yrs. ...update 11-14-16 " i havent had that feelign in a long time "  . Esophageal dysmotility    "getting worse recenly" (12/15/2013)  . Fibromyalgia   . GERD (gastroesophageal reflux disease)   . Hypertension   . Nephritis ~ 1955  . Osteoarthritis    knees   . Pancreatic cancer ([place])   . Pancreatic mass   . Renal insufficiency   . Seasonal allergies    "in the spring"  .  Shortness of breath    walking long distances   . squamous cell carcinoma scalp    squamous, on scalp, & has had several surgeries on scalp, for plastics repair of scalp   . Type II diabetes mellitus ([place])    dx 1970"s    Patient Active Problem List   Diagnosis Date Noted  . Compression fracture of thoracic vertebra ([place]) 03/11/2017  . Acute respiratory failure with hypoxia ([place]) 12/31/2016  . Acute on chronic respiratory failure with hypoxia ([place]) 12/30/2016  . Sepsis ([place]) 12/25/2016  . Port catheter in place 12/22/2016  . DM (diabetes mellitus), type 2, uncontrolled ([place]) 10/28/2016  . Confusion 10/28/2016  . Hyperglycemia 10/27/2016  . Malignant neoplasm of head of pancreas ([place])   . Hyponatremia 10/07/2016  . Abdominal pain   . Biliary obstruction 09/29/2016  . Multiple duodenal ulcers   . Lumbar adjacent segment disease with spondylolisthesis 08/20/2016  . Peripheral edema 07/18/2016  . CKD (chronic kidney disease) stage 3, GFR 30-59 ml/min (HCC) 09/21/2015  . Fibromyalgia 09/20/2015  . DJD (degenerative joint disease) of knee 12/14/2013  . Diabetes ([place]) 02/09/2013  . Morbid obesity ([place]) 02/09/2013  . Hypertension 02/09/2013  . Hyperlipidemia with target LDL less than 100 02/09/2013  . Osteoarthritis of both feet 02/09/2013  . Osteoarthritis of both  knees 02/09/2013  . GERD (gastroesophageal reflux disease) 02/09/2013  . Allergic rhinitis 02/09/2013  . 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 ABDOMINAL HYSTERECTOMY     complete  . APPENDECTOMY    . BACK SURGERY  07/2016   L 4 and L 5 fusion done at [facility]   . CARPAL TUNNEL RELEASE Right   . CARPAL TUNNEL RELEASE Left   . COLONOSCOPY    . CYST EXCISION  1990's?   scalp  . ESOPHAGOGASTRODUODENOSCOPY N/A 09/26/2016   Procedure: ESOPHAGOGASTRODUODENOSCOPY (EGD);  Surgeon: Gatha Mayer, MD;  Location: [facility] ENDOSCOPY;  Service: Endoscopy;  Laterality: N/A;    . EUS N/A 10/09/2016   Procedure: UPPER ENDOSCOPIC ULTRASOUND (EUS) LINEAR;  Surgeon: Milus Banister, MD;  Location: [facility] ENDOSCOPY;  Service: Endoscopy;  Laterality: N/A;  . IR BILIARY STENT(S) EXISTING ACCESS INC DILATION CATH EXCHANGE  10/09/2016  . IR CHOLANGIOGRAM EXISTING TUBE  10/15/2016  . IR FLUORO GUIDE PORT INSERTION RIGHT  11/19/2016  . IR INT EXT BILIARY DRAIN WITH CHOLANGIOGRAM  09/29/2016   2 gallstones  . IR REMOVAL BILIARY DRAIN  10/20/2016  . IR US GUIDE VASC ACCESS RIGHT  11/19/2016  . JOINT REPLACEMENT    . KYPHOPLASTY N/A 03/11/2017   Procedure: KYPHOPLASTY THORACIC TWELVE;  Surgeon: Ashok Pall, MD;  Location: [facility];  Service: Neurosurgery;  Laterality: N/A;  . LUMBAR DISC SURGERY  ~ 2012 X 2   double fusion ; last was 08-21-15  . RADIOLOGY WITH ANESTHESIA N/A 11/19/2016   Procedure: PORT PLACEMENT WITH GENERAL ANESTHESIA;  Surgeon: Arne Cleveland, MD;  Location: [facility] ORS;  Service: Anesthesiology;  Laterality: N/A;  . SKIN GRAFT  2002   scalp; "went to [facility] to repair OR earlier in the yearr"  . SQUAMOUS CELL CARCINOMA EXCISION  2002   "off scalp; cancerous area removed and stapled; Dr. Georgia Lopes"  . TONSILLECTOMY  age 39  . TOTAL KNEE ARTHROPLASTY Left 12/14/2013   Procedure: LEFT TOTAL KNEE ARTHROPLASTY;  Surgeon: Ninetta Lights, MD;  Location: [facility];  Service: Orthopedics;  Laterality: Left;  . TOTAL KNEE ARTHROPLASTY Left 12/15/2013    OB History    No data available       Home Medications    Prior to Admission medications   Medication Sig Start Date End Date Taking? Authorizing Provider  acetaminophen (TYLENOL) 500 MG tablet Take 500-1,000 mg by mouth every 6 (six) hours as needed for moderate pain.   Yes [provider]  amLODipine (NORVASC) 5 MG tablet Take 1 tablet (5 mg total) by mouth daily. 11/24/16  Yes Hassell Done, Mary-Margaret, FNP  capecitabine (XELODA) 500 MG tablet Take 3 tablets (1500mg ) by mouth BID, within 30 minutes of finishing a meal, on  days of radiation only Patient taking differently: Take 1,500 mg by mouth See admin instructions. Take 3 tablets (1500mg ) by mouth BID, within 30 minutes of finishing a meal, on days of radiation only 02/20/17  Yes Ladell Pier, MD  carvedilol (COREG) 25 MG tablet TAKE  (1)  TABLET TWICE A DAY WITH MEALS (BREAKFAST AND SUPPER) Patient taking differently: Take 25 mg by mouth 2 (two) times daily with a meal.  07/18/16  Yes Hassell Done, Mary-Margaret, FNP  diphenoxylate-atropine (LOMOTIL) 2.5-0.025 MG tablet Take 2 tablets by mouth 4 (four) times daily as needed for diarrhea or loose stools. 03/31/17  Yes Owens Shark, NP  DULoxetine (CYMBALTA) 60 MG capsule Take 1 capsule (  60 mg total) by mouth daily. 01/01/17  Yes Domenic Polite, MD  furosemide (LASIX) 20 MG tablet Take 1-2 tablets (20-40 mg total) by mouth daily. Take 40mg  daily for 3days then take 20mg  every other day Patient taking differently: Take 20 mg by mouth every other day.  01/01/17 01/01/18 Yes Domenic Polite, MD  HUMALOG KWIKPEN 100 UNIT/ML KiwkPen Inject 0-15 Units into the skin 3 (three) times daily. 03/05/17  Yes [provider]  Insulin Glargine (TOUJEO SOLOSTAR) 300 UNIT/ML SOPN Inject 15 Units into the skin daily. Patient taking differently: Inject 25 Units into the skin at bedtime.  01/01/17  Yes Domenic Polite, MD  lidocaine-prilocaine (EMLA) cream Apply to port site one hour prior to use. Do not rub in. Cover with plastic. 11/10/16  Yes Ladell Pier, MD  LORazepam (ATIVAN) 0.5 MG tablet Take 0.5 mg by mouth every 4 (four) hours as needed for anxiety.   Yes [provider]  ondansetron (ZOFRAN) 8 MG tablet Take 1 tablet (8 mg total) by mouth every 8 (eight) hours as needed for nausea or vomiting. 02/17/17  Yes Owens Shark, NP  Oxycodone HCl 10 MG TABS Take 1 tablet (10 mg total) by mouth every 4 (four) hours as needed. Patient taking differently: Take 10 mg by mouth every 4 (four) hours as needed (pain).  03/05/17   Yes Hayden Pedro, PA-C  pantoprazole (PROTONIX) 40 MG tablet Take 1 tablet (40 mg total) by mouth daily before breakfast. 09/26/16  Yes Gatha Mayer, MD  prochlorperazine (COMPAZINE) 10 MG tablet Take 1 tablet (10 mg total) by mouth every 6 (six) hours as needed for nausea or vomiting. 03/24/17  Yes Hayden Pedro, PA-C  glucose blood (ONE TOUCH ULTRA TEST) test strip Test up to qid. DX:E11.65 11/13/16   Hassell Done, Mary-Margaret, FNP  insulin lispro (HUMALOG) 100 UNIT/ML injection Up to 15u with each meal Patient not taking: Reported on 04/06/2017 11/24/16   Chevis Pretty, FNP    Family History Family History  Problem Relation Age of Onset  . Early death Mother   . Thrombosis Father     Social History Social History  Substance Use Topics  . Smoking status: Never Smoker  . Smokeless tobacco: Never Used  . Alcohol use No     Allergies   Patient has no known allergies.   Review of Systems Review of Systems  Constitutional: Negative for activity change, fatigue and fever.  HENT: Negative for congestion.   Respiratory: Negative for shortness of breath.   Cardiovascular: Negative for chest pain.  Gastrointestinal: Positive for diarrhea, nausea and vomiting. Negative for abdominal pain.  All other systems reviewed and are negative.    Physical Exam Updated Vital Signs BP (!) 158/54 (BP Location: Left Arm)   Pulse 98   Temp 98.6 F (37 C) (Oral)   Resp 18   Ht 5\' 3"  (1.6 m)   Wt 70.8 kg (156 lb)   SpO2 99%   BMI 27.63 kg/m   Physical Exam  Constitutional: She is oriented to person, place, and time. She appears well-developed and well-nourished.  HENT:  Head: Normocephalic and atraumatic.  Scaring to scalp.  Eyes: Right eye exhibits no discharge.  Cardiovascular: Regular rhythm.   tachycardia  Pulmonary/Chest: Effort normal and breath sounds normal. No respiratory distress.  Port in place  Abdominal: Soft. She exhibits no distension. There is  no tenderness.  Neurological: She is oriented to person, place, and time.  Skin: Skin is warm and  dry. She is not diaphoretic.  Psychiatric: She has a normal mood and affect.  Nursing note and vitals reviewed.    ED Treatments / Results  Labs (all labs ordered are listed, but only abnormal results are displayed) Labs Reviewed  COMPREHENSIVE METABOLIC PANEL - Abnormal; Notable for the following:       Result Value   Potassium 2.8 (*)    Glucose, Bld 119 (*)    Creatinine, Ser 1.13 (*)    Calcium 8.7 (*)    Total Protein 5.3 (*)    Albumin 2.2 (*)    Alkaline Phosphatase 158 (*)    GFR calc non Af Amer 47 (*)    GFR calc Af Amer 54 (*)    All other components within normal limits  CBC - Abnormal; Notable for the following:    RBC 3.15 (*)    Hemoglobin 9.2 (*)    HCT 27.0 (*)    RDW 21.1 (*)    All other components within normal limits  LIPASE, BLOOD  URINALYSIS, ROUTINE W REFLEX MICROSCOPIC    EKG  EKG Interpretation None       Radiology Ct Abdomen Pelvis W Contrast  Result Date: 04/06/2017 CLINICAL DATA:  History of pancreas cancer.  Followup. EXAM: CT ABDOMEN AND PELVIS WITH CONTRAST TECHNIQUE: Multidetector CT imaging of the abdomen and pelvis was performed using the standard protocol following bolus administration of intravenous contrast. CONTRAST:  132mL ISOVUE-300 IOPAMIDOL (ISOVUE-300) INJECTION 61% COMPARISON:  02/04/2017 FINDINGS: Lower chest: No pleural effusion identified. Calcified granuloma identified in the left lower lobe. Multiple tiny nodules are identified in both lung bases. Index nodule in the right lower lobe measuring 4 mm, image 15 of series 6. Unchanged from previous exam. Hepatobiliary: There has been interval development of multiple low-attenuation lesions in the liver compatible with metastatic disease. Index lesion within segment 7 measures 1.2 cm, image 11 of series 2. Segment 8 liver lesion measures 1.8 cm, image 14 of series 2. Posterior  right lobe of liver lesion measures 2.5 cm, image 15 of series 2. Stent is identified within the common bile duct. Pneumobilia is noted compatible with biliary patency. Pancreas: The mass involving the head of pancreas measures 5.7 x 4.5 by 5.0 cm. On the previous examination this measured 5.5 x 4.4 x 5.7 cm. Increase in mass effect upon the proximal duodenum with progressive dilatation of the pylorus and descending portion of the duodenum. Encasement and narrowing of the superior mesenteric artery is identified. There is also partial encasement of the portal vein and portal venous confluence with significant ventral displacement of the portal vein and portal venous confluence. Spleen: Calcified granulomas in the spleen. Adrenals/Urinary Tract: The adrenal glands are normal. Unremarkable appearance of both kidneys. Urinary bladder appears normal. Stomach/Bowel: The stomach is normal. There is progressive dilatation of the proximal duodenum. No pathologic dilatation of the mid and distal small bowel loops. Edema involving the wall of the transverse colon is identified. Distal colonic diverticular noted without acute inflammation Vascular/Lymphatic: Aortic atherosclerosis. No aneurysm. Portacaval node is decreased in size from previous exam. Now 7 mm, image 23 of series 2. Previously 1.2 cm. No pelvic or inguinal adenopathy. Reproductive: Status post hysterectomy. No adnexal masses. Other: No abdominal wall hernia or abnormality. No abdominopelvic ascites. Musculoskeletal: No acute or significant osseous findings. IMPRESSION: 1. Interval progression of disease. 2. New multifocal liver metastases. 3. Similar appearance of small nodules identified within the lung bases. 4. Stable to slight increase in size of mass  involving the head of pancreas. Progressive mass-effect upon the duodenum is identified. 5. Stable postsurgical changes involving L3 through L5. The T12 vertebra has been treated with bone cement.  Electronically Signed   By: Kerby Moors M.D.   On: 04/06/2017 10:29    Procedures Procedures (including critical care time)  Medications Ordered in ED Medications  iopamidol (ISOVUE-300) 61 % injection (not administered)  sodium chloride 0.9 % bolus 1,000 mL (0 mLs Intravenous Stopped 04/06/17 1034)  potassium chloride 10 mEq in 100 mL IVPB (0 mEq Intravenous Stopped 04/06/17 1004)  iopamidol (ISOVUE-300) 61 % injection 100 mL (100 mLs Intravenous Contrast Given 04/06/17 0936)  ondansetron (ZOFRAN) injection 4 mg (4 mg Intravenous Given 04/06/17 1041)  sodium chloride 0.9 % bolus 1,000 mL (0 mLs Intravenous Stopped 04/06/17 1253)  potassium chloride 10 mEq in 100 mL IVPB (0 mEq Intravenous Stopped 04/06/17 1253)     Initial Impression / Assessment and Plan / ED Course  I have reviewed the triage vital signs and the nursing notes.  Pertinent labs & imaging results that were available during my care of the patient were reviewed by me and considered in my medical decision making (see chart for details).     Patient 71 year old female with history of pancreatic cancer, on radiation and pill chemotherapy. Patient reports 5-6 days of nausea vomiting and occasional diarrhea. She reports vomiting 2-3 times a day. She reports that she stopped taking her chemotherapy agent 5-6 days ago. She thought that the chemotherapy agent was within causing her to vomit. Patient also expressed some disbelief in the efficacy of her chemotherapy pills.  Patient reports no belly pain. She's been able to take sips of fluids. She reports large volumes emesis.  Patient is due for radiation today but fell too ill so came here to the emergency department.  9:23 AM Will get labs, CT abdomen pelvis given history of pancreatic cancer. This sounds more indolent, therefore concerning for pancreatic origin rather than chemotherapy related given the fact that she stopped chemotherapy 5 days ago and has not  improved.  1:02 PM Patient has multiple metastases to the liver. It appears that the bulk of her tumors causing gastric outlet obstruction. Discussed with Dr. Reece Packer from oncology and he would like her to come be seen at the symptom management clinic, oncology today. We'll finish her second line of potassium and then send her there.   Final Clinical Impressions(s) / ED Diagnoses   Final diagnoses:  Nausea  Hypokalemia    New Prescriptions New Prescriptions   No medications on file     Macarthur Critchley, MD 04/06/17 1303

## 2017-04-06 NOTE — ED Triage Notes (Signed)
Pt complaint of ongoing n/v/d for weeks; denies pain; hx of pancreatic cancer.

## 2017-04-06 NOTE — Progress Notes (Signed)
[facility] OFFICE PROGRESS NOTE   Diagnosis:  Pancreas cancer   INTERVAL HISTORY:  Patricia Austin is seen today for an urgent visit, she arrived here from [facility] ED. She is currently undergoing radiation/Xeloda for pancreatic cancer. She missed her last 2 rad onc treatments due to weakness; Xeloda on hold for diarrhea and nausea to resolve. She has not had a dose in approx 1 week. She continues to have diarrhea despite imodium and lomotil. She began vomiting last week, 3 episodes of large volume emesis in last 24 hours; ativan, zofran, and compazine have not been effective. She has had little to no solid or liquid po intake recently. She underwent CT abd/pelvis and IV fluid hydration with potassium in the ED today. She has abdominal bloating, denies pain.    Objective:  Vital signs in last 24 hours: BP 115/63 (BP Location: Left Arm, Patient Position: Sitting)   Pulse (!) 108   Temp 98.6 F (37 C) (Oral)   Resp 18   SpO2 99%   HEENT:   Resp: lungs clear bilaterally with normal breathing effort. No wheezes.   Cardio: Irregular rate and rhythm. S1 and S2 present. No murmur. GI: soft and bloated, tenderness to RUQ on palpation. Hypoactive bowel sounds. No palpable hepatosplenomegaly.  Vascular: trace lower extremity edema bilaterally Neuro: generalized weakness PAC without erythema, accessed in ED today   Lab Results:  Lab Results  Component Value Date   WBC 5.6 04/06/2017   HGB 9.2 (L) 04/06/2017   HCT 27.0 (L) 04/06/2017   MCV 85.7 04/06/2017   PLT 241 04/06/2017   NEUTROABS 3.8 03/31/2017    Imaging:  Ct Abdomen Pelvis W Contrast  Result Date: 04/06/2017 CLINICAL DATA:  History of pancreas cancer.  Followup. EXAM: CT ABDOMEN AND PELVIS WITH CONTRAST TECHNIQUE: Multidetector CT imaging of the abdomen and pelvis was performed using the standard protocol following bolus administration of intravenous contrast. CONTRAST:  132mL ISOVUE-300 IOPAMIDOL (ISOVUE-300)  INJECTION 61% COMPARISON:  02/04/2017 FINDINGS: Lower chest: No pleural effusion identified. Calcified granuloma identified in the left lower lobe. Multiple tiny nodules are identified in both lung bases. Index nodule in the right lower lobe measuring 4 mm, image 15 of series 6. Unchanged from previous exam. Hepatobiliary: There has been interval development of multiple low-attenuation lesions in the liver compatible with metastatic disease. Index lesion within segment 7 measures 1.2 cm, image 11 of series 2. Segment 8 liver lesion measures 1.8 cm, image 14 of series 2. Posterior right lobe of liver lesion measures 2.5 cm, image 15 of series 2. Stent is identified within the common bile duct. Pneumobilia is noted compatible with biliary patency. Pancreas: The mass involving the head of pancreas measures 5.7 x 4.5 by 5.0 cm. On the previous examination this measured 5.5 x 4.4 x 5.7 cm. Increase in mass effect upon the proximal duodenum with progressive dilatation of the pylorus and descending portion of the duodenum. Encasement and narrowing of the superior mesenteric artery is identified. There is also partial encasement of the portal vein and portal venous confluence with significant ventral displacement of the portal vein and portal venous confluence. Spleen: Calcified granulomas in the spleen. Adrenals/Urinary Tract: The adrenal glands are normal. Unremarkable appearance of both kidneys. Urinary bladder appears normal. Stomach/Bowel: The stomach is normal. There is progressive dilatation of the proximal duodenum. No pathologic dilatation of the mid and distal small bowel loops. Edema involving the wall of the transverse colon is identified. Distal colonic diverticular noted without acute  inflammation Vascular/Lymphatic: Aortic atherosclerosis. No aneurysm. Portacaval node is decreased in size from previous exam. Now 7 mm, image 23 of series 2. Previously 1.2 cm. No pelvic or inguinal adenopathy. Reproductive:  Status post hysterectomy. No adnexal masses. Other: No abdominal wall hernia or abnormality. No abdominopelvic ascites. Musculoskeletal: No acute or significant osseous findings. IMPRESSION: 1. Interval progression of disease. 2. New multifocal liver metastases. 3. Similar appearance of small nodules identified within the lung bases. 4. Stable to slight increase in size of mass involving the head of pancreas. Progressive mass-effect upon the duodenum is identified. 5. Stable postsurgical changes involving L3 through L5. The T12 vertebra has been treated with bone cement. Electronically Signed   By: Kerby Moors M.D.   On: 04/06/2017 10:29    Medications: I have reviewed the patient's current medications.  Assessment/Plan: 1. Adenocarcinoma the pancreas-pancreas head/uncinatemass noted on a CT abdomen/pelvis 09/20/2016, status post an EUS biopsy 10/09/2016 confirming adenocarcinoma  CT 09/20/2016-pancreas head/uncinatemass, possible contact with the superior mesenteric artery, biliary dilatation, small low lung nodules  CTs chest, abdomen, and pelvis 10/27/2016-enlarging pancreas head mass with vascular abutment, no evidence of metastatic disease  Cycle 1 gemcitabine/Abraxane 11/25/2016  Cycle 2 gemcitabine/Abraxane 12/08/2016  Cycle 3 gemcitabine/Abraxane 12/22/2016  Cycle 4 gemcitabine/Abraxane 01/13/2017  Cycle 5 gemcitabine/Abraxane 01/26/2017  CT 02/04/2017-stable pancreas mass with abutment of the aorta, superior mesenteric artery, portal vein, and superior Mr. brain. No evidence of liver metastases. Stable right lower lobe nodule.  Radiation/Xeloda-beginning09/10/2016  CT 04/06/17 - interval progression of disease, new multifocal liver metastases; stable to slight increase in size of mass involving the head of pancreas, progressive mass-effect upon the duodenum is identified.   2. Biliary obstruction secondary to #1, status post placement of a wall stent and external biliary  drain 10/09/2016  Biliary drain removed 10/20/2016  3. Diabetes-admitted with marked hyperglycemia 10/27/2016  4. History of squamous cell carcinoma of the scalp  5. Lumbar fusion surgery February 2018  6. 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 History of Abdominal/back pain secondary to pancreas cancer  8. Cholangitis 10/07/2016  9. Confusionduring hospital admission 10/27/2016-etiology unclear, MRI brain negative for metastatic disease on 10/29/2016, improved  10. Port-A-Cath placement 11/19/2016  11. Admission 12/30/2016 with dyspnea, chest CT consistent with pulmonary edema  12. Right leg weakness-MRI lumbar spine August 2018-no evidence of metastatic disease; superior endplate fracture at M76. Loss of height 10%. No retropulsed bone. Consistent with a benign osteoporotic fracture.  T12 kyphoplasty 03/11/2017    Disposition: Patricia Austin appears ill today with symptoms and imaging indicative of gastric outlet obstruction due to progression of pancreatic cancer. This was reviewed with the patient. She will be admitted to triad hospitalist service to a telemetry bed for symptom and GI management.   Alla Feeling AGNP-C   04/06/2017  3:10 PM  This was a shared visit with Cira Rue. Patricia Austin was interviewed and examined. We reviewed the 04/06/2017 CT images.She appears to have progressive pancreas cancer while on Xeloda/radiation. The CT is consistent with liver metastases. She is developing obstructive symptoms related to the primary tumor.  She will be admitted for supportive care measures and a GI evaluation. She may be a candidate for a stent or less likely a gastrojejunostomy.  I will address options of salvage systemic therapy versus Hospice care during this hospital admission.  Julieanne Manson, M.D.

## 2017-04-07 ENCOUNTER — Telehealth: Payer: Self-pay | Admitting: *Deleted

## 2017-04-07 ENCOUNTER — Ambulatory Visit
Admission: RE | Admit: 2017-04-07 | Discharge: 2017-04-07 | Disposition: A | Discharge status: Home / Self Care | Payer: Medicare Other | Source: Ambulatory Visit | Attending: Radiation Oncology | Admitting: Radiation Oncology

## 2017-04-07 ENCOUNTER — Ambulatory Visit: Payer: Medicare Other

## 2017-04-07 DIAGNOSIS — R55 Syncope and collapse: Secondary | ICD-10-CM | POA: Diagnosis not present

## 2017-04-07 DIAGNOSIS — F419 Anxiety disorder, unspecified: Secondary | ICD-10-CM | POA: Diagnosis present

## 2017-04-07 DIAGNOSIS — Z9221 Personal history of antineoplastic chemotherapy: Secondary | ICD-10-CM | POA: Diagnosis not present

## 2017-04-07 DIAGNOSIS — E1122 Type 2 diabetes mellitus with diabetic chronic kidney disease: Secondary | ICD-10-CM | POA: Diagnosis present

## 2017-04-07 DIAGNOSIS — R933 Abnormal findings on diagnostic imaging of other parts of digestive tract: Secondary | ICD-10-CM | POA: Diagnosis not present

## 2017-04-07 DIAGNOSIS — Y842 Radiological procedure and radiotherapy as the cause of abnormal reaction of the patient, or of later complication, without mention of misadventure at the time of the procedure: Secondary | ICD-10-CM | POA: Diagnosis present

## 2017-04-07 DIAGNOSIS — C787 Secondary malignant neoplasm of liver and intrahepatic bile duct: Secondary | ICD-10-CM | POA: Diagnosis not present

## 2017-04-07 DIAGNOSIS — D500 Iron deficiency anemia secondary to blood loss (chronic): Secondary | ICD-10-CM | POA: Diagnosis not present

## 2017-04-07 DIAGNOSIS — Z981 Arthrodesis status: Secondary | ICD-10-CM | POA: Diagnosis not present

## 2017-04-07 DIAGNOSIS — R112 Nausea with vomiting, unspecified: Secondary | ICD-10-CM

## 2017-04-07 DIAGNOSIS — K311 Adult hypertrophic pyloric stenosis: Secondary | ICD-10-CM | POA: Diagnosis not present

## 2017-04-07 DIAGNOSIS — I10 Essential (primary) hypertension: Secondary | ICD-10-CM | POA: Diagnosis not present

## 2017-04-07 DIAGNOSIS — R197 Diarrhea, unspecified: Secondary | ICD-10-CM | POA: Diagnosis not present

## 2017-04-07 DIAGNOSIS — Z923 Personal history of irradiation: Secondary | ICD-10-CM | POA: Diagnosis not present

## 2017-04-07 DIAGNOSIS — C250 Malignant neoplasm of head of pancreas: Secondary | ICD-10-CM | POA: Diagnosis not present

## 2017-04-07 DIAGNOSIS — Z96652 Presence of left artificial knee joint: Secondary | ICD-10-CM | POA: Diagnosis present

## 2017-04-07 DIAGNOSIS — F329 Major depressive disorder, single episode, unspecified: Secondary | ICD-10-CM | POA: Diagnosis present

## 2017-04-07 DIAGNOSIS — E43 Unspecified severe protein-calorie malnutrition: Secondary | ICD-10-CM | POA: Insufficient documentation

## 2017-04-07 DIAGNOSIS — K315 Obstruction of duodenum: Secondary | ICD-10-CM

## 2017-04-07 DIAGNOSIS — E870 Hyperosmolality and hypernatremia: Secondary | ICD-10-CM | POA: Diagnosis not present

## 2017-04-07 DIAGNOSIS — K224 Dyskinesia of esophagus: Secondary | ICD-10-CM | POA: Diagnosis present

## 2017-04-07 DIAGNOSIS — Z85828 Personal history of other malignant neoplasm of skin: Secondary | ICD-10-CM | POA: Diagnosis not present

## 2017-04-07 DIAGNOSIS — Z794 Long term (current) use of insulin: Secondary | ICD-10-CM | POA: Diagnosis not present

## 2017-04-07 DIAGNOSIS — Z6827 Body mass index (BMI) 27.0-27.9, adult: Secondary | ICD-10-CM | POA: Diagnosis not present

## 2017-04-07 DIAGNOSIS — I129 Hypertensive chronic kidney disease with stage 1 through stage 4 chronic kidney disease, or unspecified chronic kidney disease: Secondary | ICD-10-CM | POA: Diagnosis present

## 2017-04-07 DIAGNOSIS — K219 Gastro-esophageal reflux disease without esophagitis: Secondary | ICD-10-CM | POA: Diagnosis not present

## 2017-04-07 DIAGNOSIS — K520 Gastroenteritis and colitis due to radiation: Secondary | ICD-10-CM | POA: Diagnosis present

## 2017-04-07 DIAGNOSIS — E1165 Type 2 diabetes mellitus with hyperglycemia: Secondary | ICD-10-CM | POA: Diagnosis not present

## 2017-04-07 DIAGNOSIS — M797 Fibromyalgia: Secondary | ICD-10-CM | POA: Diagnosis not present

## 2017-04-07 DIAGNOSIS — E785 Hyperlipidemia, unspecified: Secondary | ICD-10-CM | POA: Diagnosis not present

## 2017-04-07 DIAGNOSIS — E876 Hypokalemia: Secondary | ICD-10-CM | POA: Diagnosis not present

## 2017-04-07 DIAGNOSIS — N183 Chronic kidney disease, stage 3 (moderate): Secondary | ICD-10-CM | POA: Diagnosis present

## 2017-04-07 DIAGNOSIS — K2980 Duodenitis without bleeding: Secondary | ICD-10-CM | POA: Diagnosis present

## 2017-04-07 LAB — GLUCOSE, CAPILLARY
GLUCOSE-CAPILLARY: 85 mg/dL (ref 65–99)
GLUCOSE-CAPILLARY: 164 mg/dL — AB (ref 65–99)
GLUCOSE-CAPILLARY: 172 mg/dL — AB (ref 65–99)
Glucose-Capillary: 193 mg/dL — ABNORMAL HIGH (ref 65–99)

## 2017-04-07 LAB — BASIC METABOLIC PANEL
Anion gap: 3 — ABNORMAL LOW (ref 5–15)
BUN: 11 mg/dL (ref 6–20)
CALCIUM: 8 mg/dL — AB (ref 8.9–10.3)
CO2: 28 mmol/L (ref 22–32)
CREATININE: 1.04 mg/dL — AB (ref 0.44–1.00)
Chloride: 106 mmol/L (ref 101–111)
GFR calc Af Amer: >60 mL/min (ref >60)
GFR, EST NON AFRICAN AMERICAN: 52 mL/min — AB (ref >60)
GLUCOSE: 105 mg/dL — AB (ref 65–99)
Potassium: 3.7 mmol/L (ref 3.5–5.1)
Sodium: 137 mmol/L (ref 135–145)

## 2017-04-07 LAB — CBC
HCT: 22.6 % — ABNORMAL LOW (ref 36.0–46.0)
Hemoglobin: 7.5 g/dL — ABNORMAL LOW (ref 12.0–15.0)
MCH: 28.7 pg (ref 26.0–34.0)
MCHC: 33.2 g/dL (ref 30.0–36.0)
MCV: 86.6 fL (ref 78.0–100.0)
Platelets: 194 10*3/uL (ref 150–400)
RBC: 2.61 MIL/uL — ABNORMAL LOW (ref 3.87–5.11)
RDW: 21.7 % — AB (ref 11.5–15.5)
WBC: 4.7 10*3/uL (ref 4.0–10.5)

## 2017-04-07 MED ORDER — MAGNESIUM SULFATE 2 GM/50ML IV SOLN
2.0000 g | Freq: Once | INTRAVENOUS | Status: AC
  Administered 2017-04-07: 2 g via INTRAVENOUS
  Filled 2017-04-07: qty 50

## 2017-04-07 MED ORDER — OXYCODONE HCL 5 MG PO TABS
10.0000 mg | ORAL_TABLET | ORAL | Status: DC | PRN
  Administered 2017-04-07: 10 mg via ORAL
  Filled 2017-04-07: qty 2

## 2017-04-07 MED ORDER — SODIUM CHLORIDE 0.9 % IV SOLN: INTRAVENOUS | Status: DC

## 2017-04-07 MED ORDER — BOOST / RESOURCE BREEZE PO LIQD
1.0000 | Freq: Three times a day (TID) | ORAL | Status: DC
  Administered 2017-04-07 – 2017-04-08 (×2): 1 via ORAL

## 2017-04-07 MED ORDER — MORPHINE SULFATE (PF) 4 MG/ML IV SOLN: 1.0000 mg | INTRAVENOUS | Status: DC | PRN

## 2017-04-07 NOTE — Progress Notes (Signed)
The patient was seen this morning. She's been receiving radiotherapy with concurrent Xeloda for pancreatic adenocarcinoma. Her course has been prolonged by nausea and episodes of emesis refractory to scheduled zofran along with prn dosing of ativan, compazine. She received radiation intermittently during the last week or so. She was admitted due to findings concerning for extrinsic compression of the duodenum secondary to her pancreatic tumor. She was also found to have new liver lesions. She's been seen by Dr. Benay Spice and is considering her options, and is hoping to discuss options for duodenal stent with Dr. Ardis Hughs. We spent time discussing the findings, and the options for continuing radiotherapy for hopes of gaining better local control. Her tumor on personal review by Dr. Lisbeth Renshaw is not significantly larger, so the cause of her symptoms is somewhat puzzling. We discussed that mild inflammatory changes in the tissues that are within the field are possible reasons for her symptoms. We will plan to resume treatment today, and will discuss her case further in GI conference tomorrow as to how to proceed.      Carola Rhine, PAC

## 2017-04-07 NOTE — Progress Notes (Signed)
Initial Nutrition Assessment  DOCUMENTATION CODES:   Severe malnutrition in context of acute illness/injury  INTERVENTION:   -Boost Breeze TID each supplement provides 250 kcals and 9 grams of protein  -Monitor for diet advancement  NUTRITION DIAGNOSIS:   Malnutrition (severe) related to acute illness, nausea, vomiting (gastric outlet obstruction) as evidenced by severe depletion of body fat, energy intake < or equal to 50% for > or equal to 5 days, percent weight loss.  GOAL:   Patient will meet greater than or equal to 90% of their needs  MONITOR:   PO intake, Supplement acceptance, Diet advancement, Labs, Weight trends  REASON FOR ASSESSMENT:   Malnutrition Screening Tool   ASSESSMENT:   Pt is a 71 year old female with pancreatic cancer on radiation and pill chemotherapy, now with metastasis to liver and growing region in pancreas.   Pt reports n/v  2-3 times per day for the past 5-6 days and occasional diarrhea. Pt stopped taking chemotherapy pill 5-6 days ago due to suspecting it caused her n/v. Pt reports taste changes and that food doesn't take good anymore, that it tastes "off" now. Pt reports occasional abdominal pain.  Pt reports poor appetite and PO intake for the past week. Pt reports no issues swallowing. She reports eating 1-2x/day or not at all depending on how she felt. Pt ate toast with butter and jelly or a small portion of pot roast.   Pt has drank Ginger Ale, 2 cranberry juices, and tea this morning and reports tolerating it well. She said that she is not interested in drinking the broths. Spoke with pt about [employer] and she is open to trying it.   Pt reports losing 40-50lbs since March and especially since she began chemotherapy about 4 weeks ago. Per weight records in chart, pt weighed 158 lbs on 10/15, 156 lbs on 10/9, 160 lbs on 9/24, and 172 lbs on 8/6. This indicates a 8% weight loss in 2 months, severe for time frame.   Pt has radiation  scheduled this afternoon. Husband at bedside during visit.  Medications: Heparin, Novolog, Protonix  Labs: Glucose 105 (H), Cr 1.04 (H), Ca 8 (L)  IVF: D5 1/2 NS and 20 mEq KCl @ 75 306 kcal from dextrose   Nutrition Focused Physical Exam Findings: Severe fat depletion. Mild to moderate muscle depletion. No edema  Diet Order:  Diet clear liquid Room service appropriate? Yes; Fluid consistency: Thin  Skin:  Reviewed, no issues  Last BM:  04/07/17  Height:   Ht Readings from Last 1 Encounters:  04/06/17 5\' 3"  (1.6 m)    Weight:   Wt Readings from Last 1 Encounters:  04/06/17 158 lb 4.6 oz (71.8 kg)    Ideal Body Weight:  52.3 kg  BMI:  Body mass index is 28.04 kg/m.  Estimated Nutritional Needs:   Kcal:  2000-2200 kcals  Protein:  85-95 grams protein  Fluid:  >/= 2 L  EDUCATION NEEDS:   No education needs identified at this time  [place] Intern Pager: [phone] 04/07/2017 12:13 PM

## 2017-04-07 NOTE — Progress Notes (Signed)
[facility] Therapy Treatment Record Phone [phone]   Radiation Therapy was administered to [place] on: 04/07/2017  11:16 AM and was treatment # 23out of a planned course of 30treatments.  Radiation Treatment  1). Beam photons with 6-10 energy  2). Brachytherapy None  3). Stereotactic Radiosurgery None  4). Other Radiation None     Nialah Saravia, RT (T)

## 2017-04-07 NOTE — Care Management Note (Signed)
Case Management Note  Patient Details  Name: Patricia Austin MRN: [id number] Date of Birth: [birth year]  Subjective/Objective:71 y/o f admitted w/n/v. From home.                    Action/Plan:d/c plan home.   Expected Discharge Date:  04/10/17               Expected Discharge Plan:  Home/Self Care  In-House Referral:     Discharge planning Services  CM Consult  Post Acute Care Choice:    Choice offered to:     DME Arranged:    DME Agency:     HH Arranged:    HH Agency:     Status of Service:  In process, will continue to follow  If discussed at Long Length of Stay Meetings, dates discussed:    Additional Comments:  Dessa Phi, RN 04/07/2017, 2:40 PM

## 2017-04-07 NOTE — Consult Note (Signed)
Consultation  Referring Provider:  [facility]/ Dr Karleen Hampshire  Primary Care Physician:  Chevis Pretty, FNP Primary Gastroenterologist:  Dr. Hilarie Fredrickson, Dr. Carlean Purl ERCP, Dr. Ardis Hughs EUS  Reason for Consultation:  Gastric outlet obstruction/pancreatic cancer  HPI: Patricia Austin is a 71 y.o. female , Who was diagnosed with pancreatic adenocarcinoma in the spring of 2018 She had presented with biliary obstruction, underwent attempted ERCP with Dr. Carlean Purl which was unsuccessful due to tortuous spastic duodenum and large duodenal diverticulum. She subsequently had IR biliary drainage and then placement of a metal stent into the common bile duct by IR. She underwent EUS with Dr. Ardis Hughs in April 2018 again showing tortuous duodenum, at least 1 large duodenal diverticulum, and an irregular mass in the head and uncinate of the pancreas measuring 38 mm. This was biopsied. She also had a large gallstone, unable to evaluate for vascular invasion. She had been undergoing chemotherapy, under care of Dr. Benay Spice. She is currently being treated with radiation and has a treatments remaining. She has not had radiation over the past couple of weeks because of being ill with nausea vomiting etc. Patient had CT imaging in August 2018, showing enlargement of the pancreatic mass, biliary stent appeared to be in place with some narrowing in the central portionof the stent, and enlargement of the pancreatic mass abutting and encasing the SMA and partially encasing the portal vein. She had repeat imaging yesterday after she was admitted with nausea and vomiting and inability to keep down any by mouth's over the past week. CT shows multiple new liver metastases, the stent in the common bile duct appears to be patent. Pancreatic head mass measures 5.7 x 4.5 x 5 cm with increased mass effect on the proximal duodenum with progressive dilation of pylorus there is encasement and narrowing of the SMA and partial encasement of  the portal vein. She's also had a drop in hemoglobin from the 9-10 range down to the 7.5 range. Unfortunately patient has not experienced any significant increase in abdominal pain. She says she's been vomiting over the past couple of weeks, usually once or twice daily and generally just liquid, sometimes large amounts. She is able to keep small amounts of very soft foods down at times. She has not vomited any obvious blood and denies any melena or hematochezia.   Past Medical History:  Diagnosis Date  . Anxiety    "not all the time"   . Chronic lower back pain   . CKD (chronic kidney disease) stage 3, GFR 30-59 ml/min (HCC)   . Complication of anesthesia 2002   difficulty /w intubation, with plastic surgery , because she was feeling so anxious. 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 .   Pt. reports that she  is very sensitive to med. & is hard  to wake up.  . Depression    "not all the time"   . Difficult intubation 2002   with head surgery 2002 has done ok with later intubations  . Dysrhythmia    told that she sometimes has extra beats. Does not see cardiologist, had a stress test many yrs. ago>10 yrs. ...update 11-14-16 " i havent had that feelign in a long time "  . Esophageal dysmotility    "getting worse recenly" (12/15/2013)  . Fibromyalgia   . GERD (gastroesophageal reflux disease)   . Hypertension   . Nephritis ~ 1955  . Osteoarthritis    knees   . Pancreatic cancer ([place])   . Pancreatic  mass   . Renal insufficiency   . Seasonal allergies    "in the spring"  . Shortness of breath    walking long distances   . squamous cell carcinoma scalp    squamous, on scalp, & has had several surgeries on scalp, for plastics repair of scalp   . Type II diabetes mellitus ([place])    dx 1970"s    Past Surgical History:  Procedure Laterality Date  . ABDOMINAL HYSTERECTOMY     complete  . APPENDECTOMY    . BACK SURGERY  07/2016   L 4 and L 5 fusion done at [facility]   . CARPAL  TUNNEL RELEASE Right   . CARPAL TUNNEL RELEASE Left   . COLONOSCOPY    . CYST EXCISION  1990's?   scalp  . ESOPHAGOGASTRODUODENOSCOPY N/A 09/26/2016   Procedure: ESOPHAGOGASTRODUODENOSCOPY (EGD);  Surgeon: Gatha Mayer, MD;  Location: [facility] ENDOSCOPY;  Service: Endoscopy;  Laterality: N/A;  . EUS N/A 10/09/2016   Procedure: UPPER ENDOSCOPIC ULTRASOUND (EUS) LINEAR;  Surgeon: Milus Banister, MD;  Location: [facility] ENDOSCOPY;  Service: Endoscopy;  Laterality: N/A;  . IR BILIARY STENT(S) EXISTING ACCESS INC DILATION CATH EXCHANGE  10/09/2016  . IR CHOLANGIOGRAM EXISTING TUBE  10/15/2016  . IR FLUORO GUIDE PORT INSERTION RIGHT  11/19/2016  . IR INT EXT BILIARY DRAIN WITH CHOLANGIOGRAM  09/29/2016   2 gallstones  . IR REMOVAL BILIARY DRAIN  10/20/2016  . IR US GUIDE VASC ACCESS RIGHT  11/19/2016  . JOINT REPLACEMENT    . KYPHOPLASTY N/A 03/11/2017   Procedure: KYPHOPLASTY THORACIC TWELVE;  Surgeon: Ashok Pall, MD;  Location: [facility];  Service: Neurosurgery;  Laterality: N/A;  . LUMBAR DISC SURGERY  ~ 2012 X 2   double fusion ; last was 08-21-15  . RADIOLOGY WITH ANESTHESIA N/A 11/19/2016   Procedure: PORT PLACEMENT WITH GENERAL ANESTHESIA;  Surgeon: Arne Cleveland, MD;  Location: [facility] ORS;  Service: Anesthesiology;  Laterality: N/A;  . SKIN GRAFT  2002   scalp; "went to [facility] to repair OR earlier in the yearr"  . SQUAMOUS CELL CARCINOMA EXCISION  2002   "off scalp; cancerous area removed and stapled; Dr. Georgia Lopes"  . TONSILLECTOMY  age 67  . TOTAL KNEE ARTHROPLASTY Left 12/14/2013   Procedure: LEFT TOTAL KNEE ARTHROPLASTY;  Surgeon: Ninetta Lights, MD;  Location: [facility];  Service: Orthopedics;  Laterality: Left;  . TOTAL KNEE ARTHROPLASTY Left 12/15/2013    Prior to Admission medications   Medication Sig Start Date End Date Taking? Authorizing Provider  amLODipine (NORVASC) 5 MG tablet Take 1 tablet (5 mg total) by mouth daily. 11/24/16  Yes Martin, Mary-Margaret, FNP  diphenoxylate-atropine (LOMOTIL)  2.5-0.025 MG tablet Take 2 tablets by mouth 4 (four) times daily as needed for diarrhea or loose stools. 03/31/17  Yes Owens Shark, NP  DULoxetine (CYMBALTA) 60 MG capsule Take 1 capsule (60 mg total) by mouth daily. 01/01/17  Yes Domenic Polite, MD  furosemide (LASIX) 20 MG tablet Take 1-2 tablets (20-40 mg total) by mouth daily. Take 40mg  daily for 3days then take 20mg  every other day Patient taking differently: Take 20 mg by mouth every other day.  01/01/17 01/01/18 Yes Domenic Polite, MD  HUMALOG KWIKPEN 100 UNIT/ML KiwkPen Inject 0-15 Units into the skin 3 (three) times daily. 03/05/17  Yes [provider]  Insulin Glargine (TOUJEO SOLOSTAR) 300 UNIT/ML SOPN Inject 15 Units into the skin daily. Patient taking differently: Inject 25 Units into the skin at bedtime.  01/01/17  Yes Domenic Polite, MD  lidocaine-prilocaine (EMLA) cream Apply to port site one hour prior to use. Do not rub in. Cover with plastic. 11/10/16  Yes Ladell Pier, MD  ondansetron (ZOFRAN) 8 MG tablet Take 1 tablet (8 mg total) by mouth every 8 (eight) hours as needed for nausea or vomiting. 02/17/17  Yes Owens Shark, NP  Oxycodone HCl 10 MG TABS Take 1 tablet (10 mg total) by mouth every 4 (four) hours as needed. Patient taking differently: Take 10 mg by mouth every 4 (four) hours as needed (pain).  03/05/17  Yes Hayden Pedro, PA-C  pantoprazole (PROTONIX) 40 MG tablet Take 1 tablet (40 mg total) by mouth daily before breakfast. 09/26/16  Yes Gatha Mayer, MD  prochlorperazine (COMPAZINE) 10 MG tablet Take 1 tablet (10 mg total) by mouth every 6 (six) hours as needed for nausea or vomiting. 03/24/17  Yes Hayden Pedro, PA-C  acetaminophen (TYLENOL) 500 MG tablet Take 500-1,000 mg by mouth every 6 (six) hours as needed for moderate pain.    [provider]  capecitabine (XELODA) 500 MG tablet Take 3 tablets (1500mg ) by mouth BID, within 30 minutes of finishing a meal, on days of radiation  only 02/20/17   Ladell Pier, MD  carvedilol (COREG) 25 MG tablet TAKE  (1)  TABLET TWICE A DAY WITH MEALS (BREAKFAST AND SUPPER) Patient taking differently: Take 25 mg by mouth 2 (two) times daily with a meal.  07/18/16   Hassell Done, Mary-Margaret, FNP  glucose blood (ONE TOUCH ULTRA TEST) test strip Test up to qid. DX:E11.65 11/13/16   Hassell Done, Mary-Margaret, FNP  LORazepam (ATIVAN) 0.5 MG tablet Take 0.5 mg by mouth every 4 (four) hours as needed for anxiety.    [provider]    Current Facility-Administered Medications  Medication Dose Route Frequency Provider Last Rate Last Dose  . carvedilol (COREG) tablet 25 mg  25 mg Oral BID WC Velvet Bathe, MD   25 mg at 04/07/17 0855  . dextrose 5 % and 0.45 % NaCl with KCl 20 mEq/L infusion   Intravenous Continuous Velvet Bathe, MD 75 mL/hr at 04/06/17 1851    . DULoxetine (CYMBALTA) DR capsule 60 mg  60 mg Oral Daily Velvet Bathe, MD   60 mg at 04/07/17 0855  . heparin injection 5,000 Units  5,000 Units Subcutaneous Q8H Velvet Bathe, MD   5,000 Units at 04/07/17 0553  . insulin aspart (novoLOG) injection 0-5 Units  0-5 Units Subcutaneous QHS Velvet Bathe, MD      . insulin aspart (novoLOG) injection 0-9 Units  0-9 Units Subcutaneous TID WC Velvet Bathe, MD      . magnesium sulfate IVPB 2 g 50 mL  2 g Intravenous Once Hosie Poisson, MD      . morphine 4 MG/ML injection 1-2 mg  1-2 mg Intravenous Q4H PRN Hosie Poisson, MD      . ondansetron (ZOFRAN) tablet 4 mg  4 mg Oral Q6H PRN Velvet Bathe, MD       Or  . ondansetron (ZOFRAN) injection 4 mg  4 mg Intravenous Q6H PRN Velvet Bathe, MD   4 mg at 04/06/17 2358  . pantoprazole (PROTONIX) injection 40 mg  40 mg Intravenous Q24H Velvet Bathe, MD   40 mg at 04/06/17 2245  . sodium chloride flush (NS) 0.9 % injection 10-40 mL  10-40 mL Intracatheter PRN Velvet Bathe, MD      . zolpidem (AMBIEN) tablet 5 mg  5 mg Oral QHS PRN Schorr, Rhetta Mura, NP   5 mg at 04/06/17 2245    Facility-Administered Medications Ordered in Other Encounters  Medication Dose Route Frequency Provider Last Rate Last Dose  . sodium chloride flush (NS) 0.9 % injection 10 mL  10 mL Intravenous PRN Ladell Pier, MD   10 mL at 11/25/16 1510    Allergies as of 04/06/2017  . (No Known Allergies)    Family History  Problem Relation Age of Onset  . Early death Mother   . Thrombosis Father     Social History   Social History  . Marital status: Married    Spouse name: N/A  . Number of children: N/A  . Years of education: N/A   Occupational History  . Not on file.   Social History Main Topics  . Smoking status: Never Smoker  . Smokeless tobacco: Never Used  . Alcohol use No  . Drug use: No  . Sexual activity: No   Other Topics Concern  . Not on file   Social History Narrative  . No narrative on file    Review of Systems: Pertinent positive and negative review of systems were noted in the above HPI section.  All other review of systems was otherwise negative. Physical Exam: Vital signs in last 24 hours: Temp:  [97.9 F (36.6 C)-98.7 F (37.1 C)] 97.9 F (36.6 C) (10/16 0544) Pulse Rate:  [73-126] 73 (10/16 0544) Resp:  [18-20] 20 (10/16 0544) BP: (115-161)/(54-82) 123/58 (10/16 0544) SpO2:  [97 %-100 %] 97 % (10/16 0544) Weight:  [158 lb 4.6 oz (71.8 kg)] 158 lb 4.6 oz (71.8 kg) (10/15 1900) Last BM Date: 04/07/17 General:   Alert,  Well-developed, chronically ill-appearing older white female, pleasant and cooperative in NAD.Husband at bedside patient with alopecia Head:  Normocephalic and atraumatic. Eyes:  Sclera clear, no icterus.   Conjunctiva pink. Ears:  Normal auditory acuity. Nose:  No deformity, discharge,  or lesions. Mouth:  No deformity or lesions.   Neck:  Supple; no masses or thyromegaly. Lungs:  Clear throughout to auscultation.   No wheezes, crackles, or rhonchi. Heart:  Regular rate and rhythm; no murmurs, clicks, rubs,  or  gallops. Abdomen:  Soft,minimallytenderupper abdomen , no palpable mass or hepatosplenomegaly bowel sounds are hyperactive with obvious succussion splash Msk:  Symmetrical without gross deformities. . Pulses:  Normal pulses noted. Extremities:  Without clubbing or edema. Neurologic:  Alert and  oriented x4;  grossly normal neurologically. Skin:  Intact without significant lesions or rashes.. Psych:  Alert and cooperative. Normal mood and affect.  Intake/Output from previous day: 10/15 0701 - 10/16 0700 In: 1311.3 [I.V.:911.3; IV Piggyback:400] Out: 1 [Emesis/NG output:1] Intake/Output this shift: Total I/O In: 705 [P.O.:480; I.V.:225] Out: -   Lab Results:  Recent Labs  04/06/17 0751 04/06/17 1834 04/07/17 0422  WBC 5.6 7.1 4.7  HGB 9.2* 9.4* 7.5*  HCT 27.0* 27.5* 22.6*  PLT 241 306 194   BMET  Recent Labs  04/06/17 0751 04/06/17 1834 04/07/17 0422  NA 139  --  137  K 2.8*  --  3.7  CL 102  --  106  CO2 29  --  28  GLUCOSE 119*  --  105*  BUN 14  --  11  CREATININE 1.13* 1.14* 1.04*  CALCIUM 8.7*  --  8.0*   LFT  Recent Labs  04/06/17 0751  PROT 5.3*  ALBUMIN 2.2*  AST 31  ALT 14  ALKPHOS 158*  BILITOT 0.4   PT/INR No results for input(s): LABPROT, INR in the last 72 hours. Hepatitis Panel No results for input(s): HEPBSAG, HCVAB, HEPAIGM, HEPBIGM in the last 72 hours.    IMPRESSION:  #84 71 year old white female diagnosed with pancreatic cancer spring 2018, status post biliary stent placement per IR, who has been undergoing chemotherapy and radiation. Patient admitted just her today with two-week history of nausea and vomiting and inability to keep down by mouth's. Unfortunately CT imaging shows enlargement of the pancreatic head mass, multiple new liver metastases and increased mass effect from the pancreas on the proximal duodenum, and dilation of the pylorus consistent with at least partial gastric outlet obstruction. She also has encasement and  narrowing of the SMA and partial encasement of the portal vein.  #2 acute on chronic anemia-suspect she's been having some low-grade bleeding possible tumor invasion of the duodenum #3 adult-onset diabetes mellitus  Plan; Will discuss with Dr. Ardis Hughs who will review her films, and decide regarding feasibility of duodenal stent. Leave on clear liquids with supplements Discussed CT findings with patient and her husband but did not specifically discuss no evidence of enlargement of the pancreatic mass and new liver metastases.   Amy Esterwood  04/07/2017, 10:52 AM  ________________________________________________________________________  Velora Heckler GI MD note:  I personally examined the patient, reviewed the data and agree with the assessment and plan described above.  Her pancreatic cancer is progressing despite chemo/XRT, appears to have new liver mets and now has duodenal obstruction.  I explained that duodenal stent could potentially help if it is technically feasible to place. Given the difficulty noted during ERCP, EUS at even getting out of the duodenal bulb and also the apparent stricture length (discussed with radiology) I think it is unlikely that I will be able to place a stent. We agreed it is worth a try however and will plan for that tomorrow in endoscopy.  She understands there are risks such as perforation, bleeding, other, wants to proceed.   Owens Loffler, MD [facility] Gastroenterology Pager [phone]

## 2017-04-07 NOTE — Telephone Encounter (Signed)
Called the 4th fl;oor for room 1505, spoke with Joellen Jersey, RN, okay for radiation today travel via bed, asked to medicate for nausea or pain oprior to 130pm ,rad tx due at 1:40pm, thanked Nurse, mental health who gave verbalunderstanding\ 11:00 AM

## 2017-04-07 NOTE — Progress Notes (Signed)
PROGRESS NOTE    Patricia Austin  OZH:[id number] DOB: 1946-01-04 DOA: 04/06/2017 PCP: Chevis Pretty, FNP   Brief Narrative:Patricia Austin is a 71 y.o. female with medical history significant of Pancreatic cancer, with progression of disease despite chemotherapy and ongoing radiation therapy, comes in for persistent nausea, vomiting and abd pain , underwent CT abd and pelvis showing pancreatic mass having a mass effect on the duodenum with possible GOO. GI consulted and plan for duodenal stent in am.  Pt follows up with Dr Learta Codding for her chemo.   Assessment & Plan:   Active Problems:   Diabetes ([place])   Hypertension   GERD (gastroesophageal reflux disease)   Malignant neoplasm of head of pancreas (HCC)   Nausea and vomiting   Hypokalemia   Protein-calorie malnutrition, severe   Persistent nausea and vomiting possibly from GOO from mass effect of the pancreas on the duodenum.  Symptomatic management and plan for stent placement by GI in am.    Malignant neoplasm of the head of the pancreas:  S/p chemo and undergoing XRT currently.    Hypertension:  Well controlled.    Hypokalemia  And hypomagnesemia:  Replaced.   GERD; ON PPI.    DVT prophylaxis: scd's Code Status: full code.  Family Communication: husband at bedside.  Disposition Plan: SCD'S  Consultants:   Radiation oncology  Oncology  Gastroenterology.   Procedures: duodenal stent scheduled for tomorrow.  Antimicrobials:none.   Subjective: No new complaints.   Objective: Vitals:   04/06/17 1709 04/06/17 1900 04/06/17 2138 04/07/17 0544  BP: (!) 153/70  (!) 161/82 (!) 123/58  Pulse: 100  (!) 110 73  Resp: 18  20 20   Temp: 98.5 F (36.9 C)  98.7 F (37.1 C) 97.9 F (36.6 C)  TempSrc: Oral  Oral Oral  SpO2: 100%  100% 97%  Weight:  71.8 kg (158 lb 4.6 oz)    Height:  5\' 3"  (1.6 m)      Intake/Output Summary (Last 24 hours) at 04/07/17 1458 Last data filed at 04/07/17 1400  Gross per 24 hour    Intake          2796.25 ml  Output                1 ml  Net          2795.25 ml   Filed Weights   04/06/17 1900  Weight: 71.8 kg (158 lb 4.6 oz)    Examination:  General exam: Appears calm and comfortable  Respiratory system: Clear to auscultation. Respiratory effort normal. Cardiovascular system: S1 & S2 heard, RRR. No JVD, murmurs, rubs, gallops or clicks. No pedal edema. Gastrointestinal system: Abdomen is nondistended, soft with generalized tenderness.  Normal bowel sounds heard. Central nervous system: Alert and oriented. No focal neurological deficits. Extremities: Symmetric 5 x 5 power. Skin: No rashes, lesions or ulcers Psychiatry: Judgement and insight appear normal. Mood & affect appropriate.     Data Reviewed: I have personally reviewed following labs and imaging studies  CBC:  Recent Labs Lab 04/06/17 0751 04/06/17 1834 04/07/17 0422  WBC 5.6 7.1 4.7  HGB 9.2* 9.4* 7.5*  HCT 27.0* 27.5* 22.6*  MCV 85.7 85.9 86.6  PLT 241 306 629   Basic Metabolic Panel:  Recent Labs Lab 04/06/17 0751 04/06/17 1834 04/07/17 0422  NA 139  --  137  K 2.8*  --  3.7  CL 102  --  106  CO2 29  --  28  GLUCOSE  119*  --  105*  BUN 14  --  11  CREATININE 1.13* 1.14* 1.04*  CALCIUM 8.7*  --  8.0*  MG  --  1.4*  --   PHOS  --  2.7  --    GFR: Estimated Creatinine Clearance: 45.8 mL/min (A) (by C-G formula based on SCr of 1.04 mg/dL (H)). Liver Function Tests:  Recent Labs Lab 04/06/17 0751  AST 31  ALT 14  ALKPHOS 158*  BILITOT 0.4  PROT 5.3*  ALBUMIN 2.2*    Recent Labs Lab 04/06/17 0751  LIPASE 14   No results for input(s): AMMONIA in the last 168 hours. Coagulation Profile: No results for input(s): INR, PROTIME in the last 168 hours. Cardiac Enzymes: No results for input(s): CKTOTAL, CKMB, CKMBINDEX, TROPONINI in the last 168 hours. BNP (last 3 results) No results for input(s): PROBNP in the last 8760 hours. HbA1C: No results for input(s): HGBA1C  in the last 72 hours. CBG:  Recent Labs Lab 04/06/17 2306 04/07/17 0819 04/07/17 1230  GLUCAP 118* 85 164*   Lipid Profile: No results for input(s): CHOL, HDL, LDLCALC, TRIG, CHOLHDL, LDLDIRECT in the last 72 hours. Thyroid Function Tests: No results for input(s): TSH, T4TOTAL, FREET4, T3FREE, THYROIDAB in the last 72 hours. Anemia Panel: No results for input(s): VITAMINB12, FOLATE, FERRITIN, TIBC, IRON, RETICCTPCT in the last 72 hours. Sepsis Labs: No results for input(s): PROCALCITON, LATICACIDVEN in the last 168 hours.  No results found for this or any previous visit (from the past 240 hour(s)).       Radiology Studies: Ct Abdomen Pelvis W Contrast  Result Date: 04/06/2017 CLINICAL DATA:  History of pancreas cancer.  Followup. EXAM: CT ABDOMEN AND PELVIS WITH CONTRAST TECHNIQUE: Multidetector CT imaging of the abdomen and pelvis was performed using the standard protocol following bolus administration of intravenous contrast. CONTRAST:  162mL ISOVUE-300 IOPAMIDOL (ISOVUE-300) INJECTION 61% COMPARISON:  02/04/2017 FINDINGS: Lower chest: No pleural effusion identified. Calcified granuloma identified in the left lower lobe. Multiple tiny nodules are identified in both lung bases. Index nodule in the right lower lobe measuring 4 mm, image 15 of series 6. Unchanged from previous exam. Hepatobiliary: There has been interval development of multiple low-attenuation lesions in the liver compatible with metastatic disease. Index lesion within segment 7 measures 1.2 cm, image 11 of series 2. Segment 8 liver lesion measures 1.8 cm, image 14 of series 2. Posterior right lobe of liver lesion measures 2.5 cm, image 15 of series 2. Stent is identified within the common bile duct. Pneumobilia is noted compatible with biliary patency. Pancreas: The mass involving the head of pancreas measures 5.7 x 4.5 by 5.0 cm. On the previous examination this measured 5.5 x 4.4 x 5.7 cm. Increase in mass effect upon  the proximal duodenum with progressive dilatation of the pylorus and descending portion of the duodenum. Encasement and narrowing of the superior mesenteric artery is identified. There is also partial encasement of the portal vein and portal venous confluence with significant ventral displacement of the portal vein and portal venous confluence. Spleen: Calcified granulomas in the spleen. Adrenals/Urinary Tract: The adrenal glands are normal. Unremarkable appearance of both kidneys. Urinary bladder appears normal. Stomach/Bowel: The stomach is normal. There is progressive dilatation of the proximal duodenum. No pathologic dilatation of the mid and distal small bowel loops. Edema involving the wall of the transverse colon is identified. Distal colonic diverticular noted without acute inflammation Vascular/Lymphatic: Aortic atherosclerosis. No aneurysm. Portacaval node is decreased in size from previous  exam. Now 7 mm, image 23 of series 2. Previously 1.2 cm. No pelvic or inguinal adenopathy. Reproductive: Status post hysterectomy. No adnexal masses. Other: No abdominal wall hernia or abnormality. No abdominopelvic ascites. Musculoskeletal: No acute or significant osseous findings. IMPRESSION: 1. Interval progression of disease. 2. New multifocal liver metastases. 3. Similar appearance of small nodules identified within the lung bases. 4. Stable to slight increase in size of mass involving the head of pancreas. Progressive mass-effect upon the duodenum is identified. 5. Stable postsurgical changes involving L3 through L5. The T12 vertebra has been treated with bone cement. Electronically Signed   By: Kerby Moors M.D.   On: 04/06/2017 10:29        Scheduled Meds: . carvedilol  25 mg Oral BID WC  . DULoxetine  60 mg Oral Daily  . feeding supplement  1 Container Oral TID BM  . insulin aspart  0-5 Units Subcutaneous QHS  . insulin aspart  0-9 Units Subcutaneous TID WC  . pantoprazole (PROTONIX) IV  40 mg  Intravenous Q24H   Continuous Infusions: . dextrose 5 % and 0.45 % NaCl with KCl 20 mEq/L 75 mL/hr at 04/07/17 1319     LOS: 0 days    Time spent: 35 minutes.     Hosie Poisson, MD [facility] Pager [phone]   If 7PM-7AM, please contact night-coverage [email or web address] Password [email or web address] 04/07/2017, 2:58 PM

## 2017-04-08 ENCOUNTER — Encounter (HOSPITAL_COMMUNITY)
Admission: AD | Disposition: A | Discharge status: Home / Self Care | Payer: Self-pay | Source: Ambulatory Visit | Attending: Internal Medicine

## 2017-04-08 ENCOUNTER — Ambulatory Visit: Payer: Medicare Other

## 2017-04-08 ENCOUNTER — Inpatient Hospital Stay (HOSPITAL_COMMUNITY): Payer: Medicare Other | Admitting: Anesthesiology

## 2017-04-08 ENCOUNTER — Encounter (HOSPITAL_COMMUNITY): Payer: Self-pay | Admitting: *Deleted

## 2017-04-08 DIAGNOSIS — K219 Gastro-esophageal reflux disease without esophagitis: Secondary | ICD-10-CM

## 2017-04-08 DIAGNOSIS — I10 Essential (primary) hypertension: Secondary | ICD-10-CM

## 2017-04-08 DIAGNOSIS — C250 Malignant neoplasm of head of pancreas: Secondary | ICD-10-CM

## 2017-04-08 HISTORY — PX: ESOPHAGOGASTRODUODENOSCOPY (EGD) WITH PROPOFOL: SHX5813

## 2017-04-08 LAB — GLUCOSE, CAPILLARY
GLUCOSE-CAPILLARY: 129 mg/dL — AB (ref 65–99)
GLUCOSE-CAPILLARY: 150 mg/dL — AB (ref 65–99)
GLUCOSE-CAPILLARY: 125 mg/dL — AB (ref 65–99)
GLUCOSE-CAPILLARY: 77 mg/dL (ref 65–99)
GLUCOSE-CAPILLARY: 116 mg/dL — AB (ref 65–99)

## 2017-04-08 LAB — CBC
HEMATOCRIT: 22.4 % — AB (ref 36.0–46.0)
HEMOGLOBIN: 7.5 g/dL — AB (ref 12.0–15.0)
MCH: 29.3 pg (ref 26.0–34.0)
MCHC: 33.5 g/dL (ref 30.0–36.0)
MCV: 87.5 fL (ref 78.0–100.0)
Platelets: 166 10*3/uL (ref 150–400)
RBC: 2.56 MIL/uL — AB (ref 3.87–5.11)
RDW: 21.5 % — ABNORMAL HIGH (ref 11.5–15.5)
WBC: 3.2 10*3/uL — AB (ref 4.0–10.5)

## 2017-04-08 LAB — COMPREHENSIVE METABOLIC PANEL
ALBUMIN: 1.8 g/dL — AB (ref 3.5–5.0)
ALK PHOS: 139 U/L — AB (ref 38–126)
ALT: 12 U/L — ABNORMAL LOW (ref 14–54)
ANION GAP: 5 (ref 5–15)
AST: 28 U/L (ref 15–41)
BILIRUBIN TOTAL: 0.1 mg/dL — AB (ref 0.3–1.2)
BUN: 9 mg/dL (ref 6–20)
CALCIUM: 8.3 mg/dL — AB (ref 8.9–10.3)
CO2: 24 mmol/L (ref 22–32)
Chloride: 102 mmol/L (ref 101–111)
Creatinine, Ser: 1.11 mg/dL — ABNORMAL HIGH (ref 0.44–1.00)
GFR calc Af Amer: 56 mL/min — ABNORMAL LOW (ref >60)
GFR calc non Af Amer: 48 mL/min — ABNORMAL LOW (ref >60)
GLUCOSE: 148 mg/dL — AB (ref 65–99)
POTASSIUM: 4 mmol/L (ref 3.5–5.1)
SODIUM: 131 mmol/L — AB (ref 135–145)
TOTAL PROTEIN: 4.3 g/dL — AB (ref 6.5–8.1)

## 2017-04-08 LAB — PREPARE RBC (CROSSMATCH)

## 2017-04-08 LAB — ABO/RH: ABO/RH(D): A POS

## 2017-04-08 SURGERY — ESOPHAGOGASTRODUODENOSCOPY (EGD) WITH PROPOFOL
Anesthesia: General

## 2017-04-08 SURGERY — ESOPHAGOGASTRODUODENOSCOPY (EGD) WITH PROPOFOL
Anesthesia: Monitor Anesthesia Care

## 2017-04-08 MED ORDER — SUCCINYLCHOLINE CHLORIDE 200 MG/10ML IV SOSY
PREFILLED_SYRINGE | INTRAVENOUS | Status: AC
  Filled 2017-04-08: qty 10

## 2017-04-08 MED ORDER — MENTHOL 3 MG MT LOZG
1.0000 | LOZENGE | OROMUCOSAL | Status: DC | PRN
  Administered 2017-04-09: 3 mg via ORAL
  Filled 2017-04-08: qty 9

## 2017-04-08 MED ORDER — SODIUM CHLORIDE 0.9 % IV SOLN
Freq: Once | INTRAVENOUS | Status: AC
  Administered 2017-04-08: 12:00:00 via INTRAVENOUS

## 2017-04-08 MED ORDER — ONDANSETRON HCL 4 MG/2ML IJ SOLN
INTRAMUSCULAR | Status: AC
  Filled 2017-04-08: qty 2

## 2017-04-08 MED ORDER — CARVEDILOL 3.125 MG PO TABS
3.1250 mg | ORAL_TABLET | Freq: Two times a day (BID) | ORAL | Status: DC
  Administered 2017-04-08 – 2017-04-09 (×2): 3.125 mg via ORAL
  Filled 2017-04-08 (×2): qty 1

## 2017-04-08 MED ORDER — LIDOCAINE 2% (20 MG/ML) 5 ML SYRINGE
INTRAMUSCULAR | Status: AC
  Filled 2017-04-08: qty 5

## 2017-04-08 MED ORDER — LACTATED RINGERS IV SOLN
INTRAVENOUS | Status: DC | PRN
  Administered 2017-04-08: 14:00:00 via INTRAVENOUS

## 2017-04-08 MED ORDER — SUCCINYLCHOLINE CHLORIDE 200 MG/10ML IV SOSY
PREFILLED_SYRINGE | INTRAVENOUS | Status: DC | PRN
  Administered 2017-04-08: 100 mg via INTRAVENOUS

## 2017-04-08 MED ORDER — EPHEDRINE 5 MG/ML INJ
INTRAVENOUS | Status: AC
  Filled 2017-04-08: qty 10

## 2017-04-08 MED ORDER — SODIUM CHLORIDE 0.9 % IV SOLN
INTRAVENOUS | Status: DC
  Administered 2017-04-08 – 2017-04-09 (×2): via INTRAVENOUS

## 2017-04-08 MED ORDER — PROPOFOL 10 MG/ML IV BOLUS
INTRAVENOUS | Status: AC
  Filled 2017-04-08: qty 40

## 2017-04-08 MED ORDER — LIDOCAINE 2% (20 MG/ML) 5 ML SYRINGE
INTRAMUSCULAR | Status: DC | PRN
  Administered 2017-04-08: 100 mg via INTRAVENOUS

## 2017-04-08 MED ORDER — ONDANSETRON HCL 4 MG/2ML IJ SOLN
INTRAMUSCULAR | Status: DC | PRN
  Administered 2017-04-08: 4 mg via INTRAVENOUS

## 2017-04-08 MED ORDER — EPHEDRINE SULFATE-NACL 50-0.9 MG/10ML-% IV SOSY
PREFILLED_SYRINGE | INTRAVENOUS | Status: DC | PRN
  Administered 2017-04-08: 10 mg via INTRAVENOUS

## 2017-04-08 MED ORDER — PROPOFOL 10 MG/ML IV BOLUS
INTRAVENOUS | Status: AC
  Filled 2017-04-08: qty 20

## 2017-04-08 MED ORDER — PROPOFOL 10 MG/ML IV BOLUS
INTRAVENOUS | Status: DC | PRN
  Administered 2017-04-08: 150 mg via INTRAVENOUS

## 2017-04-08 SURGICAL SUPPLY — 15 items

## 2017-04-08 NOTE — Anesthesia Procedure Notes (Addendum)
Procedure Name: Intubation Date/Time: 04/08/2017 2:31 PM Performed by: Lind Covert Pre-anesthesia Checklist: Patient identified, Emergency Drugs available, Suction available, Patient being monitored and Timeout performed Patient Re-evaluated:Patient Re-evaluated prior to induction Oxygen Delivery Method: Circle system utilized Preoxygenation: Pre-oxygenation with 100% oxygen Induction Type: IV induction Ventilation: Mask ventilation without difficulty Laryngoscope Size: Mac, Glidescope and 4 Grade View: Grade II Tube type: Oral Tube size: 7.0 mm Number of attempts: 1 Airway Equipment and Method: Stylet Placement Confirmation: ETT inserted through vocal cords under direct vision,  positive ETCO2 and breath sounds checked- equal and bilateral Secured at: 21 cm Tube secured with: Tape Dental Injury: Teeth and Oropharynx as per pre-operative assessment  Difficulty Due To: Difficulty was anticipated Future Recommendations: Recommend- induction with short-acting agent, and alternative techniques readily available

## 2017-04-08 NOTE — Progress Notes (Addendum)
PROGRESS NOTE    Patricia Austin  CVE:[id number] DOB: [birth year] DOA: 04/06/2017 PCP: Chevis Pretty, FNP   Brief Narrative: 71 y.o. female with medical history significant of Pancreatic cancer, with progression of disease despite chemotherapy and ongoing radiation therapy, comes in for persistent nausea, vomiting and abd pain , underwent CT abd and pelvis showing new multiple liver mets  andpancreatic mass having a mass effect on the duodenum with possible GOO. GI consulted and plan for duodenal stent today.    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 - For now continue clear liquid dietto stent is place, she is nothing by mouth today for the procedure.  Malignant neoplasm of the head of the pancreasWith metastasis:  - S/p chemo and undergoing XRT currently.  - followed by Dr. Benay Spice  Syncope - Check with syncope this a.m., she is orthostatic, she is with anemia of hemoglobin of 7.5, she will be transfused, but has significantly lowered her Coreg dose.  Acute on chronic anemia - No evidence of overt bleeding, she is going for EGD today, receiving 1 unit PRBC  Hypertension:  - Patient orthostatic, Coreg has been lowered  Hypokalemia  And hypomagnesemia:  Replaced.   GERD - ON PPI.   Hypernatremia - sodium of 131 this a.m.,Secondary to half-normal saline, I have changed to normal saline, recheck BMP in a.m.   DVT prophylaxis: scd's Code Status: full code.  Family Communication: husband at bedside.  Disposition Plan: SCD'S  Consultants:   Radiation  oncology  Oncology  Gastroenterology.   Procedures: duodenal stent scheduled for tomorrow.  Antimicrobials:none.   Subjective: Patient has syncopal episode this a.m. At bedside commode, she had some stool incontinence, report generalized weakness  Objective: Vitals:   04/08/17 1150 04/08/17 1155 04/08/17 1209 04/08/17 1239  BP: (!) 97/48 (!) 73/36 (!) 111/47 (!) 124/55  Pulse:   64 66  Resp:   20 20  Temp:   98.1 F (36.7 C) 97.8 F (36.6 C)  TempSrc:   Oral Oral  SpO2:   100% 100%  Weight:      Height:        Intake/Output Summary (Last 24 hours) at 04/08/17 1307 Last data filed at 04/08/17 0700  Gross per 24 hour  Intake             2185 ml  Output                0 ml  Net             2185 ml   Filed Weights   04/06/17 1900  Weight: 71.8 kg (158 lb 4.6 oz)    Examination:  General exam: Appears calm and comfortable  Respiratory system: Clear to auscultation. Respiratory effort normal. Cardiovascular system: S1 & S2 heard, RRR. No JVD, murmurs, rubs, gallops or clicks. No pedal edema. Gastrointestinal system: Abdomen is nondistended, soft with generalized tenderness.  Normal bowel sounds heard. Central nervous system: Alert and oriented. No focal neurological deficits. Extremities: Symmetric 5 x 5 power. Skin: No rashes, lesions or ulcers Psychiatry: Judgement and insight appear normal. Mood &  affect appropriate.     Data Reviewed: I have personally reviewed following labs and imaging studies  CBC:  Recent Labs Lab 04/06/17 0751 04/06/17 1834 04/07/17 0422 04/08/17 0410  WBC 5.6 7.1 4.7 3.2*  HGB 9.2* 9.4* 7.5* 7.5*  HCT 27.0* 27.5* 22.6* 22.4*  MCV 85.7 85.9 86.6 87.5  PLT 241 306 194 130   Basic Metabolic Panel:  Recent Labs Lab 04/06/17 0751 04/06/17 1834 04/07/17 0422 04/08/17 0410  NA 139  --  137 131*  K 2.8*  --  3.7 4.0  CL 102  --  106 102  CO2 29  --  28 24  GLUCOSE 119*  --  105* 148*  BUN 14  --  11 9  CREATININE 1.13*  1.14* 1.04* 1.11*  CALCIUM 8.7*  --  8.0* 8.3*  MG  --  1.4*  --   --   PHOS  --  2.7  --   --    GFR: Estimated Creatinine Clearance: 42.9 mL/min (A) (by C-G formula based on SCr of 1.11 mg/dL (H)). Liver Function Tests:  Recent Labs Lab 04/06/17 0751 04/08/17 0410  AST 31 28  ALT 14 12*  ALKPHOS 158* 139*  BILITOT 0.4 0.1*  PROT 5.3* 4.3*  ALBUMIN 2.2* 1.8*    Recent Labs Lab 04/06/17 0751  LIPASE 14   No results for input(s): AMMONIA in the last 168 hours. Coagulation Profile: No results for input(s): INR, PROTIME in the last 168 hours. Cardiac Enzymes: No results for input(s): CKTOTAL, CKMB, CKMBINDEX, TROPONINI in the last 168 hours. BNP (last 3 results) No results for input(s): PROBNP in the last 8760 hours. HbA1C: No results for input(s): HGBA1C in the last 72 hours. CBG:  Recent Labs Lab 04/07/17 1635 04/07/17 2201 04/08/17 0549 04/08/17 0811 04/08/17 1233  GLUCAP 193* 172* 129* 150* 125*   Lipid Profile: No results for input(s): CHOL, HDL, LDLCALC, TRIG, CHOLHDL, LDLDIRECT in the last 72 hours. Thyroid Function Tests: No results for input(s): TSH, T4TOTAL, FREET4, T3FREE, THYROIDAB in the last 72 hours. Anemia Panel: No results for input(s): VITAMINB12, FOLATE, FERRITIN, TIBC, IRON, RETICCTPCT in the last 72 hours. Sepsis Labs: No results for input(s): PROCALCITON, LATICACIDVEN in the last 168 hours.  No results found for this or any previous visit (from the past 240 hour(s)).       Radiology Studies: No results found.      Scheduled Meds: . carvedilol  3.125 mg Oral BID WC  . DULoxetine  60 mg Oral Daily  . feeding supplement  1 Container Oral TID BM  . insulin aspart  0-5 Units Subcutaneous QHS  . insulin aspart  0-9 Units Subcutaneous TID WC  . pantoprazole (PROTONIX) IV  40 mg Intravenous Q24H   Continuous Infusions: . sodium chloride    . sodium chloride       LOS: 1 day    Time spent: 25 minutes.     Phillips Climes, MD [facility] Pager [phone]  If 7PM-7AM, please contact night-coverage [email or web address] Password [email or web address] 04/08/2017, 1:07 PM

## 2017-04-08 NOTE — Progress Notes (Signed)
Patient ID: Patricia Austin, female   DOB: 1946-04-25, 71 y.o.   MRN: [id number]     Progress Note   Subjective   Pt had a syncopal episode this am while trying to get from bedside commode - incontinent loose stool - non bloody  Pt feels fine now- husband says she has had several syncopal episodes at home past year - now using a wheel chair most of the time Vitals stable - mentating well - HGB 7.5 stable from yesterday - blood x 1 ordered EKG done -P   Objective   Vital signs in last 24 hours: Temp:  [97.5 F (36.4 C)-98.9 F (37.2 C)] 97.5 F (36.4 C) (10/17 0547) Pulse Rate:  [66-77] 71 (10/17 0710) Resp:  [16-20] 16 (10/17 0547) BP: (112-183)/(52-67) 112/54 (10/17 0710) SpO2:  [96 %-100 %] 100 % (10/17 0710) Last BM Date: 04/07/17 General: chronically ill    white female in NAD Heart:  Regular rate and rhythm; no murmurs Lungs: Respirations even and unlabored, lungs CTA bilaterally Abdomen:  Soft, nontender and nondistended. Normal bowel sounds. Extremities:  Without edema. Neurologic:  Alert and oriented,  grossly normal neurologically. Psych:  Cooperative. Normal mood and affect.  Intake/Output from previous day: 10/16 0701 - 10/17 0700 In: 2890 [P.O.:1080; I.V.:1810] Out: 0  Intake/Output this shift: No intake/output data recorded.  Lab Results:  Recent Labs  04/06/17 1834 04/07/17 0422 04/08/17 0410  WBC 7.1 4.7 3.2*  HGB 9.4* 7.5* 7.5*  HCT 27.5* 22.6* 22.4*  PLT 306 194 166   BMET  Recent Labs  04/06/17 0751 04/06/17 1834 04/07/17 0422 04/08/17 0410  NA 139  --  137 131*  K 2.8*  --  3.7 4.0  CL 102  --  106 102  CO2 29  --  28 24  GLUCOSE 119*  --  105* 148*  BUN 14  --  11 9  CREATININE 1.13* 1.14* 1.04* 1.11*  CALCIUM 8.7*  --  8.0* 8.3*   LFT  Recent Labs  04/08/17 0410  PROT 4.3*  ALBUMIN 1.8*  AST 28  ALT 12*  ALKPHOS 139*  BILITOT 0.1*   PT/INR No results for input(s): LABPROT, INR in the last 72  hours.  Studies/Results: Ct Abdomen Pelvis W Contrast  Result Date: 04/06/2017 CLINICAL DATA:  History of pancreas cancer.  Followup. EXAM: CT ABDOMEN AND PELVIS WITH CONTRAST TECHNIQUE: Multidetector CT imaging of the abdomen and pelvis was performed using the standard protocol following bolus administration of intravenous contrast. CONTRAST:  124mL ISOVUE-300 IOPAMIDOL (ISOVUE-300) INJECTION 61% COMPARISON:  02/04/2017 FINDINGS: Lower chest: No pleural effusion identified. Calcified granuloma identified in the left lower lobe. Multiple tiny nodules are identified in both lung bases. Index nodule in the right lower lobe measuring 4 mm, image 15 of series 6. Unchanged from previous exam. Hepatobiliary: There has been interval development of multiple low-attenuation lesions in the liver compatible with metastatic disease. Index lesion within segment 7 measures 1.2 cm, image 11 of series 2. Segment 8 liver lesion measures 1.8 cm, image 14 of series 2. Posterior right lobe of liver lesion measures 2.5 cm, image 15 of series 2. Stent is identified within the common bile duct. Pneumobilia is noted compatible with biliary patency. Pancreas: The mass involving the head of pancreas measures 5.7 x 4.5 by 5.0 cm. On the previous examination this measured 5.5 x 4.4 x 5.7 cm. Increase in mass effect upon the proximal duodenum with progressive dilatation of the pylorus and descending portion of  the duodenum. Encasement and narrowing of the superior mesenteric artery is identified. There is also partial encasement of the portal vein and portal venous confluence with significant ventral displacement of the portal vein and portal venous confluence. Spleen: Calcified granulomas in the spleen. Adrenals/Urinary Tract: The adrenal glands are normal. Unremarkable appearance of both kidneys. Urinary bladder appears normal. Stomach/Bowel: The stomach is normal. There is progressive dilatation of the proximal duodenum. No pathologic  dilatation of the mid and distal small bowel loops. Edema involving the wall of the transverse colon is identified. Distal colonic diverticular noted without acute inflammation Vascular/Lymphatic: Aortic atherosclerosis. No aneurysm. Portacaval node is decreased in size from previous exam. Now 7 mm, image 23 of series 2. Previously 1.2 cm. No pelvic or inguinal adenopathy. Reproductive: Status post hysterectomy. No adnexal masses. Other: No abdominal wall hernia or abnormality. No abdominopelvic ascites. Musculoskeletal: No acute or significant osseous findings. IMPRESSION: 1. Interval progression of disease. 2. New multifocal liver metastases. 3. Similar appearance of small nodules identified within the lung bases. 4. Stable to slight increase in size of mass involving the head of pancreas. Progressive mass-effect upon the duodenum is identified. 5. Stable postsurgical changes involving L3 through L5. The T12 vertebra has been treated with bone cement. Electronically Signed   By: Kerby Moors M.D.   On: 04/06/2017 10:29       Assessment / Plan:    #1 71 yo female with metastatic pancreatic Cancer- new multiple liver mets , and GOO secondary to pancreatic head mass ,? Invasion into duodenum   #2 acute on chronic anemia- no overt bleeding - transfusing x 1 this am #3 Syncopal episode this am - likely vaso vagal - will check orthostatics #4 malnutrition   Plan; Plan to proceed with EGD and possible duodenal stent today  Broached subject of venting PEG  If unable to place stent - thus far tolerating liquids - neither pt or husband had opinion on PEG - will think about it.    Contact  Dachelle Molzahn, P.A.-C               [phone]      Active Problems:   Diabetes ([place])   Hypertension   GERD (gastroesophageal reflux disease)   Malignant neoplasm of head of pancreas (HCC)   Nausea and vomiting   Hypokalemia   Protein-calorie malnutrition, severe     LOS: 1 day   Patricia Austin   04/08/2017, 9:29 AM

## 2017-04-08 NOTE — Op Note (Signed)
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 Estimated blood loss:                            None. Estimated Blood Loss:     Estimated blood loss: none. Procedure:                Pre-Anesthesia Assessment:                           - Prior to the procedure, a History and Physical                            was performed, and patient medications and                            allergies were reviewed. The patient's tolerance of                            previous anesthesia was also reviewed. The risks                            and benefits of the procedure and the sedation                            options and risks were discussed with the patient.                            All questions were answered, and informed consent                            was obtained. Prior Anticoagulants: The patient has                            taken no previous anticoagulant or  antiplatelet                            agents. ASA Grade Assessment: III - A patient with                            severe systemic disease. After reviewing the risks                            and benefits, the patient was deemed in                            satisfactory condition to undergo the procedure.                           After obtaining informed consent, the endoscope was                            passed under direct vision. Throughout the                            procedure, the patient's blood pressure, pulse, and                            oxygen saturations were monitored continuously. The                            [id number] [id number]) scope was introduced through the                            mouth, and advanced to the second part of duodenum.                            The upper GI endoscopy was accomplished without                            difficulty. The patient tolerated the procedure                            well. Scope In: Scope Out: Findings:      The esophagus was normal.      The stomach was normal.      There were several shallow ulcers in the distal duodenal bulb with       surrounding mild duodenitis. There was significant anatomic deformity of       the duodenum with spasticity and tortuosity. I could not advance the       scope within proximity of the proximal end of the duodenal stricture.       There was no chance at duodenal stent placement. Impression:               - Unable to even attempt duodenal stent placement                            due to signifcant tortuosity, spasticity of the  proximal and second duodenum.                           - Several shallow ulcers in the distal duodenal                            bulb, consistent with radiation enteritis.                           - There were no retained gastric secretions; this                            is not a complete obstruction. Moderate Sedation:      N/A- Per Anesthesia Care Recommendation:           - Return patient to hospital ward for ongoing care.                           - Full liquid diet and she should never advance to                            more solid food.                           - Would consider holding further radiation and                            chemotherapy since disease seems to be progressing.                            - Continue present medications. Procedure Code(s):        --- Professional ---                           [phone], Esophagogastroduodenoscopy, flexible,                            transoral; diagnostic, including collection of                            specimen(s) by brushing or washing, when performed                            (separate procedure) Diagnosis Code(s):        --- Professional ---                           R11.2, Nausea with vomiting, unspecified CPT copyright 2016 American Medical Association. All rights reserved. The codes documented in this report are preliminary and upon coder review may  be revised to meet current compliance requirements. Milus Banister, MD 04/08/2017 3:04:48 PM This report has been signed electronically. Number of Addenda: 0

## 2017-04-08 NOTE — Transfer of Care (Signed)
Immediate Anesthesia Transfer of Care Note  Patient: Patricia Austin  Procedure(s) Performed: ESOPHAGOGASTRODUODENOSCOPY (EGD) WITH PROPOFOL (N/A )  Patient Location: PACU  Anesthesia Type:General  Level of Consciousness: sedated  Airway & Oxygen Therapy: Patient Spontanous Breathing and Patient connected to face mask oxygen  Post-op Assessment: Report given to RN and Post -op Vital signs reviewed and stable  Post vital signs: Reviewed and stable  Last Vitals:  Vitals:   04/08/17 1239 04/08/17 1358  BP: (!) 124/55 (!) 128/49  Pulse: 66 62  Resp: 20 17  Temp: 36.6 C 36.4 C  SpO2: 100% 100%    Last Pain:  Vitals:   04/08/17 1358  TempSrc: Oral  PainSc:       Patients Stated Pain Goal: 0 (72/90/21 1155)  Complications: No apparent anesthesia complications

## 2017-04-08 NOTE — Progress Notes (Signed)
Patient had a syncopal episode this am. She had a incontinent diarrhea episode this am while trying to get to the [facility]. Staff RN and NT were there assisting her back to bed with the help of her husband when patient suddenly went limp, unresponsive, and eyes fixed. We were able to get her back on the [facility], thus she never hit the the floor. After about 2 minutes and sternal rub patient began slowly responding. There was no loss of pulse. Other staff were called to help get patient back in bed. She also had another incontinent loose stool. When patient asked about the events she does not remember passing out. Vitals taken, patient has returned to normal mentation, NP on call was notified. Will continue to monitor patient.

## 2017-04-08 NOTE — Anesthesia Preprocedure Evaluation (Addendum)
Anesthesia Evaluation  Patient identified by MRN, date of birth, ID band Patient awake    Reviewed: Allergy & Precautions, NPO status , Patient's Chart, lab work & pertinent test results  History of Anesthesia Complications (+) DIFFICULT AIRWAY  Airway Mallampati: II  TM Distance: >3 FB     Dental   Pulmonary shortness of breath,    breath sounds clear to auscultation       Cardiovascular hypertension, + dysrhythmias  Rhythm:Regular Rate:Normal     Neuro/Psych    GI/Hepatic Neg liver ROS, GERD  ,Progressive pancreatic head cancer with duodenal obstruction.   Endo/Other  diabetes  Renal/GU Renal disease     Musculoskeletal   Abdominal   Peds  Hematology  (+) anemia , S/p PRBC's x 1unit   Anesthesia Other Findings   Reproductive/Obstetrics                             Lab Results  Component Value Date   WBC 3.2 (L) 04/08/2017   HGB 7.5 (L) 04/08/2017   HCT 22.4 (L) 04/08/2017   MCV 87.5 04/08/2017   PLT 166 04/08/2017   Lab Results  Component Value Date   CREATININE 1.11 (H) 04/08/2017   BUN 9 04/08/2017   NA 131 (L) 04/08/2017   K 4.0 04/08/2017   CL 102 04/08/2017   CO2 24 04/08/2017    Anesthesia Physical  Anesthesia Plan  ASA: IV  Anesthesia Plan: General   Post-op Pain Management:    Induction: Intravenous and Rapid sequence  PONV Risk Score and Plan: 3 and Ondansetron, Dexamethasone, Propofol infusion and Treatment may vary due to age or medical condition  Airway Management Planned: Oral ETT and Video Laryngoscope Planned  Additional Equipment:   Intra-op Plan:   Post-operative Plan: Extubation in OR and Possible Post-op intubation/ventilation  Informed Consent: I have reviewed the patients History and Physical, chart, labs and discussed the procedure including the risks, benefits and alternatives for the proposed anesthesia with the patient or authorized  representative who has indicated his/her understanding and acceptance.   Dental advisory given  Plan Discussed with: CRNA and Anesthesiologist  Anesthesia Plan Comments:        Anesthesia Quick Evaluation

## 2017-04-08 NOTE — Progress Notes (Signed)
I called the patient's room and her husband answered. Since she's going for procedure and had a rough course with her symptoms overnight we will hold off on radiation today and make decisions tomorrow on how to proceed.    Carola Rhine, PAC

## 2017-04-09 ENCOUNTER — Telehealth: Payer: Self-pay | Admitting: Nurse Practitioner

## 2017-04-09 ENCOUNTER — Ambulatory Visit: Payer: Medicare Other

## 2017-04-09 ENCOUNTER — Encounter (HOSPITAL_COMMUNITY): Payer: Self-pay | Admitting: Gastroenterology

## 2017-04-09 DIAGNOSIS — E1165 Type 2 diabetes mellitus with hyperglycemia: Secondary | ICD-10-CM

## 2017-04-09 DIAGNOSIS — Z794 Long term (current) use of insulin: Secondary | ICD-10-CM

## 2017-04-09 DIAGNOSIS — K311 Adult hypertrophic pyloric stenosis: Principal | ICD-10-CM

## 2017-04-09 DIAGNOSIS — R197 Diarrhea, unspecified: Secondary | ICD-10-CM

## 2017-04-09 LAB — BASIC METABOLIC PANEL
Anion gap: 4 — ABNORMAL LOW (ref 5–15)
BUN: 11 mg/dL (ref 6–20)
CHLORIDE: 108 mmol/L (ref 101–111)
CO2: 25 mmol/L (ref 22–32)
Calcium: 8.6 mg/dL — ABNORMAL LOW (ref 8.9–10.3)
Creatinine, Ser: 1.24 mg/dL — ABNORMAL HIGH (ref 0.44–1.00)
GFR calc Af Amer: 49 mL/min — ABNORMAL LOW (ref >60)
GFR calc non Af Amer: 42 mL/min — ABNORMAL LOW (ref >60)
GLUCOSE: 106 mg/dL — AB (ref 65–99)
POTASSIUM: 4.2 mmol/L (ref 3.5–5.1)
Sodium: 137 mmol/L (ref 135–145)

## 2017-04-09 LAB — CBC
HEMATOCRIT: 30.3 % — AB (ref 36.0–46.0)
HEMOGLOBIN: 10.1 g/dL — AB (ref 12.0–15.0)
MCH: 29.4 pg (ref 26.0–34.0)
MCHC: 33.3 g/dL (ref 30.0–36.0)
MCV: 88.1 fL (ref 78.0–100.0)
Platelets: 207 10*3/uL (ref 150–400)
RBC: 3.44 MIL/uL — AB (ref 3.87–5.11)
RDW: 20.2 % — ABNORMAL HIGH (ref 11.5–15.5)
WBC: 5.2 10*3/uL (ref 4.0–10.5)

## 2017-04-09 LAB — GLUCOSE, CAPILLARY
GLUCOSE-CAPILLARY: 108 mg/dL — AB (ref 65–99)
GLUCOSE-CAPILLARY: 178 mg/dL — AB (ref 65–99)

## 2017-04-09 MED ORDER — ONDANSETRON HCL 4 MG PO TABS: 4.0000 mg | ORAL_TABLET | Freq: Four times a day (QID) | ORAL | 0 refills | Status: DC | PRN

## 2017-04-09 MED ORDER — ONDANSETRON HCL 4 MG PO TABS
4.0000 mg | ORAL_TABLET | Freq: Two times a day (BID) | ORAL | Status: DC
  Administered 2017-04-09: 4 mg via ORAL
  Filled 2017-04-09: qty 1

## 2017-04-09 MED ORDER — BOOST / RESOURCE BREEZE PO LIQD: 1.0000 | Freq: Three times a day (TID) | ORAL | 0 refills | Status: AC

## 2017-04-09 MED ORDER — CARVEDILOL 3.125 MG PO TABS: 3.1250 mg | ORAL_TABLET | Freq: Two times a day (BID) | ORAL | 0 refills | Status: DC

## 2017-04-09 MED ORDER — HEPARIN SOD (PORK) LOCK FLUSH 100 UNIT/ML IV SOLN
500.0000 [IU] | INTRAVENOUS | Status: AC | PRN
  Administered 2017-04-09: 500 [IU]

## 2017-04-09 MED ORDER — LOPERAMIDE HCL 2 MG PO CAPS
2.0000 mg | ORAL_CAPSULE | ORAL | Status: DC | PRN
  Administered 2017-04-09: 2 mg via ORAL
  Filled 2017-04-09: qty 1

## 2017-04-09 MED ORDER — LOPERAMIDE HCL 2 MG PO CAPS: 2.0000 mg | ORAL_CAPSULE | ORAL | 0 refills | Status: AC | PRN

## 2017-04-09 NOTE — Discharge Instructions (Signed)
Follow with Primary MD Chevis Pretty, FNP in 7 days   Get CBC, CMP, checked  by Primary MD next visit.    Activity: As tolerated with Full fall precautions use walker/cane & assistance as needed   Disposition Home    Diet: full liquid diet, do not advance  For Heart failure patients - Check your Weight same time everyday, if you gain over 2 pounds, or you develop in leg swelling, experience more shortness of breath or chest pain, call your Primary MD immediately. Follow Cardiac Low Salt Diet and 1.5 lit/day fluid restriction.   On your next visit with your primary care physician please Get Medicines reviewed and adjusted.   Please request your Prim.MD to go over all Hospital Tests and Procedure/Radiological results at the follow up, please get all Hospital records sent to your Prim MD by signing hospital release before you go home.   If you experience worsening of your admission symptoms, develop shortness of breath, life threatening emergency, suicidal or homicidal thoughts you must seek medical attention immediately by calling 911 or calling your MD immediately  if symptoms less severe.  You Must read complete instructions/literature along with all the possible adverse reactions/side effects for all the Medicines you take and that have been prescribed to you. Take any new Medicines after you have completely understood and accpet all the possible adverse reactions/side effects.   Do not drive, operating heavy machinery, perform activities at heights, swimming or participation in water activities or provide baby sitting services if your were admitted for syncope or siezures until you have seen by Primary MD or a Neurologist and advised to do so again.  Do not drive when taking Pain medications.    Do not take more than prescribed Pain, Sleep and Anxiety Medications  Special Instructions: If you have smoked or chewed Tobacco  in the last 2 yrs please stop smoking, stop any  regular Alcohol  and or any Recreational drug use.  Wear Seat belts while driving.   Please note  You were cared for by a hospitalist during your hospital stay. If you have any questions about your discharge medications or the care you received while you were in the hospital after you are discharged, you can call the unit and asked to speak with the hospitalist on call if the hospitalist that took care of you is not available. Once you are discharged, your primary care physician will handle any further medical issues. Please note that NO REFILLS for any discharge medications will be authorized once you are discharged, as it is imperative that you return to your primary care physician (or establish a relationship with a primary care physician if you do not have one) for your aftercare needs so that they can reassess your need for medications and monitor your lab values.

## 2017-04-09 NOTE — Progress Notes (Signed)
Shift event note:  Notified by RN regarding pt having syncopal episode while being assisted back to bed from [facility]. Pt was being cleaned up s/p 2 incontinent loose stools. RN reports pt briefly unresponsive w/ brisk return to baseline. VSS after episode. At bedside pt noted lying in bed in NAD. She awakens easily and is oriented x 3. She does not remember episode but states fainting spells not new. Husband confirms multiple episodes of "fainting" over last several weeks. Also admits diarrhea not new as well. BBS CTA. abd soft, nt w/ + BS in all quads. Pt MAE x 4. There is no facial droop or slurred speech. CN II-XII appear grossly intact. BP slightly elevated at 183/67, HR-71, RR-16 w/ 02 sast of 96% on r/a. Pt remains afebrile.  Assessment/Plan: 1. Syncopal episode: Rapid return to baseline. Exam completely non-focal. VSS. Apparently recurring issue per pt and husband. Will obtain EKG to assure no arrhythmia or other EKG changes. No indication for imaging at this time. Will defer further changes or additions to rounding MD. Will continue to monitor closely on med-surg pending EKG.   Jeryl Columbia, NP-C [facility] Pager [phone]

## 2017-04-09 NOTE — Progress Notes (Signed)
Patient and her husband given discharge, follow up, and medication instructions, verbalized understanding, IV team RN to deacess port a cath prior to DC home, prescriptions given to patient, family will transport home

## 2017-04-09 NOTE — Progress Notes (Signed)
     [facility] Progress Note  CC:  Pancreatic cancer  Subjective:  Feeling better.  Tolerating full liquids.  Duodenal stent unable to be placed on 10/17 due to deformity and spasticity of the duodenum.  Objective:  Vital signs in last 24 hours: Temp:  [97.6 F (36.4 C)-98.6 F (37 C)] 97.8 F (36.6 C) (10/18 0527) Pulse Rate:  [62-73] 72 (10/18 0527) Resp:  [17-20] 18 (10/18 0527) BP: (73-172)/(36-115) 115/48 (10/18 0527) SpO2:  [96 %-100 %] 96 % (10/18 0527) Weight:  [158 lb (71.7 kg)] 158 lb (71.7 kg) (10/17 1358) Last BM Date: 04/09/17 General:  Alert, chronically ill-appearing, in NAD Heart:  Regular rate and rhythm; no murmurs Pulm:  CTAB.  No increased WOB. Abdomen:  Soft, non-distended.  BS present.  Non-tender. Extremities:  Without edema. Neurologic:  Alert and oriented x 4;  grossly normal neurologically. Psych:  Alert and cooperative. Normal mood and affect.  Lab Results:  Recent Labs  04/07/17 0422 04/08/17 0410 04/09/17 0317  WBC 4.7 3.2* 5.2  HGB 7.5* 7.5* 10.1*  HCT 22.6* 22.4* 30.3*  PLT 194 166 207   BMET  Recent Labs  04/07/17 0422 04/08/17 0410 04/09/17 0317  NA 137 131* 137  K 3.7 4.0 4.2  CL 106 102 108  CO2 28 24 25   GLUCOSE 105* 148* 106*  BUN 11 9 11   CREATININE 1.04* 1.11* 1.24*  CALCIUM 8.0* 8.3* 8.6*   LFT  Recent Labs  04/08/17 0410  PROT 4.3*  ALBUMIN 1.8*  AST 28  ALT 12*  ALKPHOS 139*  BILITOT 0.1*   Assessment / Plan: #1 71 yo female with metastatic pancreatic Cancer- new multiple liver mets, and partial GOO secondary to pancreatic head mass,? Invasion into duodenum.  Dr. Ardis Hughs unable to place duodenal stent on 10/17 due to deformity/tortuosity of duodenum.  Tolerating full liquids and should never advance past this.  Will order zofran every 12 hours scheduled for nausea.      #2 acute on chronic anemia- no overt bleeding.  Hgb improved to 10.1 grams after one unit PRBC's. #3 malnutrition   *Ok  for discharge from GI standpoint and follow-up with oncology.    LOS: 2 days   ZEHR, JESSICA D.  04/09/2017, 9:15 AM  Pager number [phone]  ________________________________________________________________________  Velora Heckler GI MD note:  I personally examined the patient, reviewed the data and agree with the assessment and plan described above.  She should not advance past full liquids.    Please call or page with any further questions or concerns.    Owens Loffler, MD [facility] Pager [phone]

## 2017-04-09 NOTE — Anesthesia Postprocedure Evaluation (Signed)
Anesthesia Post Note  Patient: Patricia Austin  Procedure(s) Performed: ESOPHAGOGASTRODUODENOSCOPY (EGD) WITH PROPOFOL (N/A )     Patient location during evaluation: PACU Anesthesia Type: General Level of consciousness: awake and alert Pain management: pain level controlled Vital Signs Assessment: post-procedure vital signs reviewed and stable Respiratory status: spontaneous breathing, nonlabored ventilation, respiratory function stable and patient connected to nasal cannula oxygen Cardiovascular status: blood pressure returned to baseline and stable Postop Assessment: no apparent nausea or vomiting Anesthetic complications: no    Last Vitals:  Vitals:   04/08/17 2032 04/09/17 0527  BP: (!) 116/50 (!) 115/48  Pulse: 73 72  Resp: 18 18  Temp: 37 C 36.6 C  SpO2: 100% 96%    Last Pain:  Vitals:   04/09/17 0831  TempSrc:   PainSc: 0-No pain                 Tiajuana Amass

## 2017-04-09 NOTE — Progress Notes (Signed)
I spoke with Dr. Benay Spice this morning and since she was unable to be stented, he is thinking more palliative approach with her and he is going to see her back in a few weeks to see how she's fairing. He would like [place] to stop XRT, and the patient is in agreement. She may benefit from discussion with palliative care as well.    Carola Rhine, PAC

## 2017-04-09 NOTE — Discharge Summary (Signed)
Patricia Austin, is a 71 y.o. female  DOB 03/21/1946  MRN [id number].  Admission date:  04/06/2017  Admitting Physician  A Melven Sartorius., MD  Discharge Date:  04/09/2017   Primary MD  Chevis Pretty, FNP  Recommendations for primary care physician for things to follow:  - please check CBC, BMP during next visit - Patient to continue full liquid diet, do not advance any further  Admission Diagnosis  GI outlet obstruction duodenal obstruction   Discharge Diagnosis  GI outlet obstruction duodenal obstruction    Active Problems:   Diabetes ([place])   Hypertension   GERD (gastroesophageal reflux disease)   Malignant neoplasm of head of pancreas (HCC)   Nausea and vomiting   Hypokalemia   Protein-calorie malnutrition, severe      Past Medical History:  Diagnosis Date  . Anxiety    "not all the time"   . Chronic lower back pain   . CKD (chronic kidney disease) stage 3, GFR 30-59 ml/min (HCC)   . Complication of anesthesia 2002   difficulty /w intubation, with plastic surgery , because she was feeling so anxious. 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 .   Pt. reports that she  is very sensitive to med. & is hard  to wake up.  . Depression    "not all the time"   . Difficult intubation 2002   with head surgery 2002 has done ok with later intubations  . Dysrhythmia    told that she sometimes has extra beats. Does not see cardiologist, had a stress test many yrs. ago>10 yrs. ...update 11-14-16 " i havent had that feelign in a long time "  . Esophageal dysmotility    "getting worse recenly" (12/15/2013)  . Fibromyalgia   . GERD (gastroesophageal reflux disease)   . Hypertension   . Nephritis ~ 1955  . Osteoarthritis    knees   . Pancreatic cancer ([place])   . Pancreatic mass   . Renal insufficiency   . Seasonal allergies    "in the spring"  . Shortness of breath    walking long  distances   . squamous cell carcinoma scalp    squamous, on scalp, & has had several surgeries on scalp, for plastics repair of scalp   . Type II diabetes mellitus ([place])    dx 1970"s    Past Surgical History:  Procedure Laterality Date  . ABDOMINAL HYSTERECTOMY     complete  . APPENDECTOMY    . BACK SURGERY  07/2016   L 4 and L 5 fusion done [facility]   . CARPAL TUNNEL RELEASE Right   . CARPAL TUNNEL RELEASE Left   . COLONOSCOPY    . CYST EXCISION  1990's?   scalp  . ESOPHAGOGASTRODUODENOSCOPY N/A 09/26/2016   Procedure: ESOPHAGOGASTRODUODENOSCOPY (EGD);  Surgeon: Gatha Mayer, MD;  Location: [facility] ENDOSCOPY;  Service: Endoscopy;  Laterality: N/A;  . EUS N/A 10/09/2016   Procedure: UPPER ENDOSCOPIC ULTRASOUND (EUS) LINEAR;  Surgeon: Milus Banister, MD;  Location: [facility] ENDOSCOPY;  Service: Endoscopy;  Laterality: N/A;  . IR BILIARY STENT(S) EXISTING ACCESS INC DILATION CATH EXCHANGE  10/09/2016  . IR CHOLANGIOGRAM EXISTING TUBE  10/15/2016  . IR FLUORO GUIDE PORT INSERTION RIGHT  11/19/2016  . IR INT EXT BILIARY DRAIN WITH CHOLANGIOGRAM  09/29/2016   2 gallstones  . IR REMOVAL BILIARY DRAIN  10/20/2016  . IR US GUIDE VASC ACCESS RIGHT  11/19/2016  . JOINT REPLACEMENT    . KYPHOPLASTY N/A 03/11/2017   Procedure: KYPHOPLASTY THORACIC TWELVE;  Surgeon: Ashok Pall, MD;  Location: [place];  Service: Neurosurgery;  Laterality: N/A;  . LUMBAR DISC SURGERY  ~ 2012 X 2   double fusion ; last was 08-21-15  . RADIOLOGY WITH ANESTHESIA N/A 11/19/2016   Procedure: PORT PLACEMENT WITH GENERAL ANESTHESIA;  Surgeon: Arne Cleveland, MD;  Location: [facility] ORS;  Service: Anesthesiology;  Laterality: N/A;  . SKIN GRAFT  2002   scalp; "went [facility] to repair OR earlier in the yearr"  . SQUAMOUS CELL CARCINOMA EXCISION  2002   "off scalp; cancerous area removed and stapled; Dr. Georgia Lopes"  . TONSILLECTOMY  age 41  . TOTAL KNEE ARTHROPLASTY Left 12/14/2013   Procedure: LEFT TOTAL KNEE ARTHROPLASTY;  Surgeon: Ninetta Lights, MD;  Location: [place];  Service: Orthopedics;  Laterality: Left;  . TOTAL KNEE ARTHROPLASTY Left 12/15/2013       History of present illness and  Hospital Course:     Kindly see H&P for history of present illness and admission details, please review complete Labs, Consult reports and Test reports for all details in brief  HPI  from the history and physical done on the day of admission 04/06/2017 HPI: Patricia Austin is a 71 y.o. female with medical history significant of Pancreatic cancer now with metastasis to liver and growing pancreatic head impinging on duodenum. Pt reports persistent nausea and emesis with difficulty eating/tolerating food. Has not eaten well for the last 1 week. Problems has been persistent and getting worse. Patient was seen at the oncologist office and transitioned here for symptom management and GI consult for further recommendations regarding gastric outlet issue at duodenum.     Hospital Course  71 y.o.femalewith medical history significant of Pancreatic cancer, with progression of disease despite chemotherapy and ongoing radiation therapy, comes in for persistent nausea, vomiting and abd pain , underwent CT abd and pelvis showing new multiple liver mets  andpancreatic mass having a mass effect on the duodenum with possible GOO. GI consulted and she went for EGD with possible stent placement 04/08/2017, unfortunately GI were unable to insert stent, recommendation for full liquid diet.  Gastric outlet obstruction secondary to pancreatic head mass - As evident on CT abdomen and pelvis, Unclear if compression versusinvasion into duodenum are not. - she was seen by GI, went for endoscopy with possible duodenal stent placement 04/08/2017, unfortunately unable to place stent due to deformity and spasticity of the duodenum, recommendation for full liquid diet, did not advance.  Malignant neoplasm of the head of the pancreasWith metastasis:  - she is followed by  Dr. Ammie Dalton, recommendatiois to discontinue Xelod.  Syncope - she had syncope during hospital stay, she was orthostatic, hemoglobin that day was 7.5, she was transfused, stopped amlodipine, lowered Coreg from 25mg   twice a day to 3.25mg   twice a day  Acute on chronic anemia - No evidence of overt bleeding, EGD significant for radiation ulcers, she is on PPI, hemoglobin as low as 7.5, received  1 unit PRBC withgood response, hemoglobin is 10.1 on discharge  Hypertension:  - Patient orthostatic, Coreg has been lowered, amlodipine has been stopped  Hypokalemia  And hypomagnesemia:  Replaced.   GERD - ON PPI.   Hypernatremia - sodium of 131 this a.m.,Secondary to half-normal saline, I have changed to normal saline, corrected, it is 137 on discharge  Diabetes mellitus - Given poor oral intake, her numbers were accepted on sliding scale, no requirement for long term 1 during hospital stay, so [place]  has been held on discharge      Discharge Condition:  stable   Follow UP  Follow-up Information    Hassell Done, Mary-Margaret, FNP Follow up in 1 week(s).   Specialty:  Family Medicine Contact information: [place] [address] [phone]             Discharge Instructions  and  Discharge Medications    Discharge Instructions    Discharge instructions    Complete by:  As directed    Follow with Primary MD Chevis Pretty, FNP in 7 days   Get CBC, CMP, checked  by Primary MD next visit.    Activity: As tolerated with Full fall precautions use walker/cane & assistance as needed   Disposition Home    Diet: full liquid diet, do not advance  For Heart failure patients - Check your Weight same time everyday, if you gain over 2 pounds, or you develop in leg swelling, experience more shortness of breath or chest pain, call your Primary MD immediately. Follow Cardiac Low Salt Diet and 1.5 lit/day fluid restriction.   On your next visit with  your primary care physician please Get Medicines reviewed and adjusted.   Please request your Prim.MD to go over all Hospital Tests and Procedure/Radiological results at the follow up, please get all Hospital records sent to your Prim MD by signing hospital release before you go home.   If you experience worsening of your admission symptoms, develop shortness of breath, life threatening emergency, suicidal or homicidal thoughts you must seek medical attention immediately by calling 911 or calling your MD immediately  if symptoms less severe.  You Must read complete instructions/literature along with all the possible adverse reactions/side effects for all the Medicines you take and that have been prescribed to you. Take any new Medicines after you have completely understood and accpet all the possible adverse reactions/side effects.   Do not drive, operating heavy machinery, perform activities at heights, swimming or participation in water activities or provide baby sitting services if your were admitted for syncope or siezures until you have seen by Primary MD or a Neurologist and advised to do so again.  Do not drive when taking Pain medications.    Do not take more than prescribed Pain, Sleep and Anxiety Medications  Special Instructions: If you have smoked or chewed Tobacco  in the last 2 yrs please stop smoking, stop any regular Alcohol  and or any Recreational drug use.  Wear Seat belts while driving.   Please note  You were cared for by a hospitalist during your hospital stay. If you have any questions about your discharge medications or the care you received while you were in the hospital after you are discharged, you can call the unit and asked to speak with the hospitalist on call if the hospitalist that took care of you is not available. Once you are discharged, your primary care physician will handle any further medical issues. Please note  that NO REFILLS for any discharge  medications will be authorized once you are discharged, as it is imperative that you return to your primary care physician (or establish a relationship with a primary care physician if you do not have one) for your aftercare needs so that they can reassess your need for medications and monitor your lab values.   Increase activity slowly    Complete by:  As directed      Allergies as of 04/09/2017   No Known Allergies     Medication List    STOP taking these medications   amLODipine 5 MG tablet Commonly known as:  NORVASC   capecitabine 500 MG tablet Commonly known as:  XELODA   furosemide 20 MG tablet Commonly known as:  LASIX   Insulin Glargine 300 UNIT/ML Sopn Commonly known as:  TOUJEO SOLOSTAR     TAKE these medications   acetaminophen 500 MG tablet Commonly known as:  TYLENOL Take 500-1,000 mg by mouth every 6 (six) hours as needed for moderate pain.   carvedilol 3.125 MG tablet Commonly known as:  COREG Take 1 tablet (3.125 mg total) by mouth 2 (two) times daily with a meal. What changed:  medication strength  how much to take  how to take this  when to take this  additional instructions   diphenoxylate-atropine 2.5-0.025 MG tablet Commonly known as:  LOMOTIL Take 2 tablets by mouth 4 (four) times daily as needed for diarrhea or loose stools.   DULoxetine 60 MG capsule Commonly known as:  CYMBALTA Take 1 capsule (60 mg total) by mouth daily.   feeding supplement Liqd Take 1 Container by mouth 3 (three) times daily between meals.   glucose blood test strip Commonly known as:  ONE TOUCH ULTRA TEST Test up to qid. DX:E11.65   HUMALOG KWIKPEN 100 UNIT/ML KiwkPen Generic drug:  insulin lispro Inject 0-15 Units into the skin 3 (three) times daily.   lidocaine-prilocaine cream Commonly known as:  EMLA Apply to port site one hour prior to use. Do not rub in. Cover with plastic.   loperamide 2 MG capsule Commonly known as:  IMODIUM Take 1 capsule (2  mg total) by mouth as needed for diarrhea or loose stools.   LORazepam 0.5 MG tablet Commonly known as:  ATIVAN Take 0.5 mg by mouth every 4 (four) hours as needed for anxiety.   ondansetron 8 MG tablet Commonly known as:  ZOFRAN Take 1 tablet (8 mg total) by mouth every 8 (eight) hours as needed for nausea or vomiting. What changed:  Another medication with the same name was added. Make sure you understand how and when to take each.   ondansetron 4 MG tablet Commonly known as:  ZOFRAN Take 1 tablet (4 mg total) by mouth every 6 (six) hours as needed for nausea. What changed:  You were already taking a medication with the same name, and this prescription was added. Make sure you understand how and when to take each.   Oxycodone HCl 10 MG Tabs Take 1 tablet (10 mg total) by mouth every 4 (four) hours as needed. What changed:  reasons to take this   pantoprazole 40 MG tablet Commonly known as:  PROTONIX Take 1 tablet (40 mg total) by mouth daily before breakfast.   prochlorperazine 10 MG tablet Commonly known as:  COMPAZINE Take 1 tablet (10 mg total) by mouth every 6 (six) hours as needed for nausea or vomiting.         Diet  and Activity recommendation: See Discharge Instructions above   Consults obtained -  GI Oncology Radiation oncology   Major procedures and Radiology Reports - PLEASE review detailed and final reports for all details, in brief -   Upper GI endoscopy Indications:              Nausea with vomiting, duodenal obstruction from                            growing pancreatic adenocarcinoma Providers:                Milus Banister, MD, Cleda Daub, RN, Elspeth Cho Tech., Technician, Derrek Gu. Alday CRNA,                            CRNA Referring MD:              Medicines:                General Anesthesia Complications:            No immediate complications. Estimated blood loss:                             None. Estimated Blood Loss:     Estimated blood loss: none. Procedure:                Pre-Anesthesia Assessment:                           - Prior to the procedure, a History and Physical                            was performed, and patient medications and                            allergies were reviewed. The patient's tolerance of                            previous anesthesia was also reviewed. The risks                            and benefits of the procedure and the sedation                            options and risks were discussed with the patient.                            All questions were answered, and informed consent                            was obtained. Prior Anticoagulants: The patient has                            taken no previous anticoagulant or antiplatelet  agents. ASA Grade Assessment: III - A patient with                            severe systemic disease. After reviewing the risks                            and benefits, the patient was deemed in                            satisfactory condition to undergo the procedure.                           After obtaining informed consent, the endoscope was                            passed under direct vision. Throughout the                            procedure, the patient's blood pressure, pulse, and                            oxygen saturations were monitored continuously. The                            [id number] scope was introduced through the                            mouth, and advanced to the second part of duodenum.                            The upper GI endoscopy was accomplished without                            difficulty. The patient tolerated the procedure                            well. Scope In: Scope Out: Findings:      The esophagus was normal.      The stomach was normal.      There were several shallow ulcers in the distal duodenal bulb with        surrounding mild duodenitis. There was significant anatomic deformity of       the duodenum with spasticity and tortuosity. I could not advance the       scope within proximity of the proximal end of the duodenal stricture.       There was no chance at duodenal stent placement. Impression:               - Unable to even attempt duodenal stent placement                            due to signifcant tortuosity, spasticity of the                            proximal and second duodenum.                           -  Several shallow ulcers in the distal duodenal                            bulb, consistent with radiation enteritis.                           - There were no retained gastric secretions; this                            is not a complete obstruction. Moderate Sedation:      N/A- Per Anesthesia Care Recommendation:           - Return patient to hospital ward for ongoing care.                           - Full liquid diet and she should never advance to                            more solid food.                           - Would consider holding further radiation and                            chemotherapy since disease seems to be progressing.                           - Continue present medications. Procedure Code(s):        --- Professional ---                           [phone], Esophagogastroduodenoscopy, flexible,                            transoral; diagnostic, including collection of                            specimen(s) by brushing or washing, when performed                            (separate procedure)    Dg Thoracolumabar Spine  Result Date: 03/11/2017 CLINICAL DATA:  T12 kyphoplasty. FLUOROSCOPY TIME:  2 minutes 7 seconds. EXAM: DG C-ARM 61-120 MIN; THORACOLUMBAR SPINE - 2 VIEW COMPARISON:  CT scan of December 31, 2016. FINDINGS: Two intraoperative fluoroscopic images of the thoracolumbar spine demonstrate the patient to be status post kyphoplasty of T12. Contrast appears to  be well situated within the vertebral body. Good alignment of vertebral bodies is noted. IMPRESSION: Status post T12 kyphoplasty. Electronically Signed   By: Marijo Conception, M.D.   On: 03/11/2017 15:54   Ct Abdomen Pelvis W Contrast  Result Date: 04/06/2017 CLINICAL DATA:  History of pancreas cancer.  Followup. EXAM: CT ABDOMEN AND PELVIS WITH CONTRAST TECHNIQUE: Multidetector CT imaging of the abdomen and pelvis was performed using the standard protocol following bolus administration of intravenous contrast. CONTRAST:  133mL ISOVUE-300 IOPAMIDOL (ISOVUE-300) INJECTION 61% COMPARISON:  02/04/2017 FINDINGS: Lower chest: No pleural effusion identified. Calcified granuloma  identified in the left lower lobe. Multiple tiny nodules are identified in both lung bases. Index nodule in the right lower lobe measuring 4 mm, image 15 of series 6. Unchanged from previous exam. Hepatobiliary: There has been interval development of multiple low-attenuation lesions in the liver compatible with metastatic disease. Index lesion within segment 7 measures 1.2 cm, image 11 of series 2. Segment 8 liver lesion measures 1.8 cm, image 14 of series 2. Posterior right lobe of liver lesion measures 2.5 cm, image 15 of series 2. Stent is identified within the common bile duct. Pneumobilia is noted compatible with biliary patency. Pancreas: The mass involving the head of pancreas measures 5.7 x 4.5 by 5.0 cm. On the previous examination this measured 5.5 x 4.4 x 5.7 cm. Increase in mass effect upon the proximal duodenum with progressive dilatation of the pylorus and descending portion of the duodenum. Encasement and narrowing of the superior mesenteric artery is identified. There is also partial encasement of the portal vein and portal venous confluence with significant ventral displacement of the portal vein and portal venous confluence. Spleen: Calcified granulomas in the spleen. Adrenals/Urinary Tract: The adrenal glands are normal.  Unremarkable appearance of both kidneys. Urinary bladder appears normal. Stomach/Bowel: The stomach is normal. There is progressive dilatation of the proximal duodenum. No pathologic dilatation of the mid and distal small bowel loops. Edema involving the wall of the transverse colon is identified. Distal colonic diverticular noted without acute inflammation Vascular/Lymphatic: Aortic atherosclerosis. No aneurysm. Portacaval node is decreased in size from previous exam. Now 7 mm, image 23 of series 2. Previously 1.2 cm. No pelvic or inguinal adenopathy. Reproductive: Status post hysterectomy. No adnexal masses. Other: No abdominal wall hernia or abnormality. No abdominopelvic ascites. Musculoskeletal: No acute or significant osseous findings. IMPRESSION: 1. Interval progression of disease. 2. New multifocal liver metastases. 3. Similar appearance of small nodules identified within the lung bases. 4. Stable to slight increase in size of mass involving the head of pancreas. Progressive mass-effect upon the duodenum is identified. 5. Stable postsurgical changes involving L3 through L5. The T12 vertebra has been treated with bone cement. Electronically Signed   By: Kerby Moors M.D.   On: 04/06/2017 10:29   Dg C-arm 1-60 Min  Result Date: 03/11/2017 CLINICAL DATA:  T12 kyphoplasty. FLUOROSCOPY TIME:  2 minutes 7 seconds. EXAM: DG C-ARM 61-120 MIN; THORACOLUMBAR SPINE - 2 VIEW COMPARISON:  CT scan of December 31, 2016. FINDINGS: Two intraoperative fluoroscopic images of the thoracolumbar spine demonstrate the patient to be status post kyphoplasty of T12. Contrast appears to be well situated within the vertebral body. Good alignment of vertebral bodies is noted. IMPRESSION: Status post T12 kyphoplasty. Electronically Signed   By: Marijo Conception, M.D.   On: 03/11/2017 15:54    Micro Results  No results found for this or any previous visit (from the past 240 hour(s)).     Today   Subjective:   Heena Whitford today  has no headache,no chest orabdominal pain,no further syncope, report generalized weakness, tolerating full liquid diet.  Objective:   Blood pressure (!) 115/48, pulse 72, temperature 97.8 F (36.6 C), temperature source Oral, resp. rate 18, height 5\' 3"  (1.6 m), weight 71.7 kg (158 lb), SpO2 96 %.   Intake/Output Summary (Last 24 hours) at 04/09/17 1217 Last data filed at 04/09/17 0450  Gross per 24 hour  Intake          1819.25 ml  Output  0 ml  Net          1819.25 ml    Exam Awake Alert, Oriented x 3, No new F.N deficits, Normal affect Symmetrical Chest wall movement, Good air movement bilaterally, CTAB RRR,No Gallops,Rubs or new Murmurs, No Parasternal Heave +ve B.Sounds, Abd Soft,Mild epigastric tenderness, No rebound -guarding or rigidity. No Cyanosis, Clubbing or edema, No new Rash or bruise  Data Review   CBC w Diff: Lab Results  Component Value Date   WBC 5.2 04/09/2017   HGB 10.1 (L) 04/09/2017   HGB 8.9 (L) 03/31/2017   HCT 30.3 (L) 04/09/2017   HCT 26.9 (L) 03/31/2017   PLT 207 04/09/2017   PLT 230 03/31/2017   PLT 301 12/29/2016   LYMPHOPCT 3.6 (L) 03/31/2017   MONOPCT 10.5 03/31/2017   EOSPCT 3.4 03/31/2017   BASOPCT 0.3 03/31/2017    CMP: Lab Results  Component Value Date   NA 137 04/09/2017   NA 137 03/16/2017   K 4.2 04/09/2017   K 3.5 03/16/2017   CL 108 04/09/2017   CO2 25 04/09/2017   CO2 27 03/16/2017   BUN 11 04/09/2017   BUN 14.6 03/16/2017   CREATININE 1.24 (H) 04/09/2017   CREATININE 1.0 03/16/2017   GLU 140 (H) 12/08/2016   PROT 4.3 (L) 04/08/2017   PROT 5.6 (L) 03/16/2017   ALBUMIN 1.8 (L) 04/08/2017   ALBUMIN 2.2 (L) 03/16/2017   BILITOT 0.1 (L) 04/08/2017   BILITOT 0.39 03/16/2017   ALKPHOS 139 (H) 04/08/2017   ALKPHOS 322 (H) 03/16/2017   AST 28 04/08/2017   AST 22 03/16/2017   ALT 12 (L) 04/08/2017   ALT <6 03/16/2017  .   Total Time in preparing paper work, data evaluation and todays exam - 35  minutes  ELGERGAWY, DAWOOD M.D on 04/09/2017 at 12:17 PM  [facility]   Office  [phone]

## 2017-04-09 NOTE — Progress Notes (Signed)
IP PROGRESS NOTE  Subjective:   Patricia Austin was admitted with symptoms of gastric outlet obstruction. She feels better and is tolerating liquids. Dr. Ardis Hughs attempted to place a duodenal stent yesterday, but the stent could not be placed secondary to severe obstruction of the duodenum.  She complains of diarrhea.  Objective: Vital signs in last 24 hours: Blood pressure (!) 115/48, pulse 72, temperature 97.8 F (36.6 C), temperature source Oral, resp. rate 18, height 5\' 3"  (1.6 m), weight 158 lb (71.7 kg), SpO2 96 %.  Intake/Output from previous day: 10/17 0701 - 10/18 0700 In: 1819.3 [I.V.:1428.8; Blood:390.5] Out: 0   Physical Exam: not performed today    Lab Results:  Recent Labs  04/08/17 0410 04/09/17 0317  WBC 3.2* 5.2  HGB 7.5* 10.1*  HCT 22.4* 30.3*  PLT 166 207    BMET  Recent Labs  04/08/17 0410 04/09/17 0317  NA 131* 137  K 4.0 4.2  CL 102 108  CO2 24 25  GLUCOSE 148* 106*  BUN 9 11  CREATININE 1.11* 1.24*  CALCIUM 8.3* 8.6*     Medications: I have reviewed the patient's current medications.  Assessment/Plan:  1. Adenocarcinoma the pancreas-pancreas head/uncinatemass noted on a CT abdomen/pelvis 09/20/2016, status post an EUS biopsy 10/09/2016 confirming adenocarcinoma  CT 09/20/2016-pancreas head/uncinatemass, possible contact with the superior mesenteric artery, biliary dilatation, small low lung nodules  CTs chest, abdomen, and pelvis 10/27/2016-enlarging pancreas head mass with vascular abutment, no evidence of metastatic disease  Cycle 1 gemcitabine/Abraxane 11/25/2016  Cycle 2 gemcitabine/Abraxane 12/08/2016  Cycle 3 gemcitabine/Abraxane 12/22/2016  Cycle 4 gemcitabine/Abraxane 01/13/2017  Cycle 5 gemcitabine/Abraxane 01/26/2017  CT 02/04/2017-stable pancreas mass with abutment of the aorta, superior mesenteric artery, portal vein, and superior Mr. brain. No evidence of liver metastases. Stable right lower lobe  nodule.  Radiation/Xeloda-beginning09/10/2016  CT 04/06/17 - interval progression of disease, new multifocal liver metastases; stable to slight increase in size of mass involving the head of pancreas, progressive mass-effect upon the duodenum is identified.   2. Biliary obstruction secondary to #1, status post placement of a wall stent and external biliary drain 10/09/2016  Biliary drain removed 10/20/2016  3. Diabetes-admitted with marked hyperglycemia 10/27/2016  4. History of squamous cell carcinoma of the scalp  5. Lumbar fusion surgery February 2018  6. 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 History of Abdominal/back pain secondary to pancreas cancer  8. Cholangitis 10/07/2016  9. Confusionduring hospital admission 10/27/2016-etiology unclear, MRI brain negative for metastatic disease on 10/29/2016, improved  10. Port-A-Cath placement 11/19/2016  11. Admission 12/30/2016 with dyspnea, chest CT consistent with pulmonary edema  12. Right leg weakness-MRI lumbar spine August 2018-no evidence of metastatic disease; superior endplate fracture at Z61. Loss of height 10%. No retropulsed bone. Consistent with a benign osteoporotic fracture.  T12 kyphoplasty 03/11/2017   13.  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 She has developed progressive local and metastatic disease while on Xeloda/radiation. I recommend discontinuing the current treatment.  Dr. Ardis Hughs attempted to place a duodenal stent yesterday, but the duodenal obstruction was too severe for placement of a stent. She is tolerating liquids. A liquid diet as recommended. Patricia Austin would like to continue a solid diet. We discussed a gastrojejunostomy, but I think it would be difficult  for her to undergo surgery with her current performance status and  metastatic pancreas cancer. I recommend continuing a liquid diet and she agrees.  She is stable for discharge from an oncology standpoint. She has a follow-up appointment at the [facility] 04/30/2017. She will call us if she would like to return sooner.  Recommendations: 1. Discontinue Xeloda/radiation 2. Nutrition consult for liquid diet recommendations 3. Outpatient follow-up at the [facility] as scheduled    LOS: 2 days   Donneta Romberg, MD   04/09/2017, 9:36 AM

## 2017-04-09 NOTE — Progress Notes (Signed)
NUTRITION NOTE  Pt seen for full assessment on 04/07/17. Consult received today for education: Duodenal obstruction secondary to cancer, needs liquid diet.  Pt in bed with husband at bedside. Used hospital menu as a Agricultural consultant as well as handout from the Academy of [employer] ("Full Liquid Nutrition Therapy"). Pt states she was informed a few days ago that she will never be able to advance above full liquids and rationale for this was provided to her at that time.   Discussed full liquids as well as clear liquids for pt and provided her with ways to still be able to enjoy favorite foods. For example, she states that she greatly enjoys the pumpkin pie she makes; encouraged her to scoop out a portion of the pie filling and to mix with cream or milk to make it into the consistency of pudding or yogurt. Pt is agreeable to these recommendations and states that she is less concerned and feels less restricted following discussion with RD.  Pt denies further nutrition-related questions or concerns at this time.     Jarome Matin, MS, RD, LDN, [facility] Inpatient Clinical Dietitian Pager # [phone] After hours/weekend pager # [phone]

## 2017-04-10 ENCOUNTER — Ambulatory Visit: Payer: Medicare Other

## 2017-04-10 NOTE — Telephone Encounter (Signed)
appt scheduled Pt notified 

## 2017-04-12 LAB — BPAM RBC
BLOOD PRODUCT EXPIRATION DATE: 201810282359
Blood Product Expiration Date: 201810262359
Blood Product Expiration Date: 201810282359
ISSUE DATE / TIME: 201810171209
UNIT TYPE AND RH: 6200
UNIT TYPE AND RH: 6200
Unit Type and Rh: 6200

## 2017-04-12 LAB — TYPE AND SCREEN
ABO/RH(D): A POS
ANTIBODY SCREEN: NEGATIVE
UNIT DIVISION: 0
Unit division: 0
Unit division: 0

## 2017-04-13 ENCOUNTER — Ambulatory Visit: Payer: Medicare Other

## 2017-04-13 ENCOUNTER — Encounter: Payer: Self-pay | Admitting: Radiation Oncology

## 2017-04-13 ENCOUNTER — Ambulatory Visit (INDEPENDENT_AMBULATORY_CARE_PROVIDER_SITE_OTHER): Payer: Medicare Other | Admitting: Nurse Practitioner

## 2017-04-13 ENCOUNTER — Encounter: Payer: Self-pay | Admitting: Nurse Practitioner

## 2017-04-13 VITALS — BP 158/90 | HR 100 | Temp 97.7°F

## 2017-04-13 DIAGNOSIS — C250 Malignant neoplasm of head of pancreas: Secondary | ICD-10-CM

## 2017-04-13 DIAGNOSIS — Z09 Encounter for follow-up examination after completed treatment for conditions other than malignant neoplasm: Secondary | ICD-10-CM | POA: Diagnosis not present

## 2017-04-13 DIAGNOSIS — K311 Adult hypertrophic pyloric stenosis: Secondary | ICD-10-CM | POA: Diagnosis not present

## 2017-04-13 DIAGNOSIS — Z23 Encounter for immunization: Secondary | ICD-10-CM

## 2017-04-13 NOTE — Progress Notes (Signed)
   Subjective:    Patient ID: Patricia Austin, female    DOB: 1946-01-31, 71 y.o.   MRN: [id number]  HPI Patient in today for hospital follow up. SHe has diagnosis of pancreatic cancer and they have recently seen some spots on her liver. She was recently in hospital with abdominal pain, nausea and vomiting. She was diagnosed with gastric outlet obstruction from pancreatic mass. Is now on liquid diet because she cannot get solid foods down. She is very weak and is unable to do much of anything. Her husband is her caregiver. They have stopped her chemo and radiation for now. Has follow up with oncologist on the 8th of November. She needs some repeat labs today.    Review of Systems  Constitutional: Positive for appetite change (only liquids now).  HENT: Negative.   Respiratory: Negative.   Cardiovascular: Negative.   Gastrointestinal: Positive for abdominal distention. Negative for constipation, diarrhea, nausea and vomiting.  Genitourinary: Negative.   Neurological: Positive for weakness.  Psychiatric/Behavioral: Negative.   All other systems reviewed and are negative.      Objective:   Physical Exam  Constitutional: She is oriented to person, place, and time. She appears well-developed and well-nourished. She appears distressed (mild).  Cardiovascular: Normal rate.   Pulmonary/Chest: Effort normal and breath sounds normal.  Abdominal: Soft. Bowel sounds are normal. She exhibits distension (mild). She exhibits no mass. There is no tenderness. There is no guarding.  Neurological: She is alert and oriented to person, place, and time.  Skin: Skin is warm and dry.  Psychiatric: She has a normal mood and affect. Her behavior is normal. Judgment and thought content normal.    BP (!) 158/90   Pulse 100   Temp 97.7 F (36.5 C) (Oral)        Assessment & Plan:   1. Malignant neoplasm of head of pancreas ([place])   2. Gastric outlet obstruction   3. Hospital discharge follow-up    Orders  Placed This Encounter  Procedures  . CBC with Differential/Platelet  . [facility] records reviewed [place] precautions rto as needed  Mary-Margaret Hassell Done, FNP

## 2017-04-13 NOTE — Patient Instructions (Signed)
Full Liquid Diet A full liquid diet may be used:  To help you transition from a clear liquid diet to a soft diet.  When your body is healing and can only tolerate foods that are easy to digest.  Before or after certain a procedure, test, or surgery (such as stomach or intestinal surgeries).  If you have trouble swallowing or chewing. A full liquid diet includes fluids and foods that are liquid or will become liquid at room temperature. The full liquid diet gives you the proteins, fluids, salts, and minerals that you need for energy. If you continue this diet for more than 72 hours, talk to your health care provider about how many calories you need to consume. If you continue the diet for more than 5 days, talk to your health care provider about taking a multivitamin or a nutritional supplement. What do I need to know about a full liquid diet?  You may have any liquid.  You may have any food that becomes a liquid at room temperature. The food is considered a liquid if it can be poured off a spoon at room temperature.  Drink one serving of citrus or vitamin C-enriched fruit juice daily. What foods can I eat? Grains  Any grain food that can be pureed in soup (such as crackers, pasta, and rice). Hot cereal (such as farina or oatmeal) that has been blended. Talk to your health care provider or dietitian about these foods. Vegetables  Pulp-free tomato or vegetable juice. Vegetables pureed in soup. Fruits  Fruit juice, including nectars and juices with pulp. Meats and Other Protein Sources  Eggs in custard, eggnog mix, and eggs used in ice cream or pudding. Strained meats, like in baby food, may be allowed. Consult your health care provider. Dairy  Milk and milk-based beverages, including milk shakes and instant breakfast mixes. Smooth yogurt. Pureed cottage cheese. Avoid these foods if they are not well tolerated. Beverages  All beverages, including liquid nutritional supplements. Ask your  health care provider if you can have carbonated beverages. They may not be well tolerated. Condiments  Iodized salt, pepper, spices, and flavorings. Cocoa powder. Vinegar, ketchup, yellow mustard, smooth sauces (such as hollandaise, cheese sauce, or white sauce), and soy sauce. Sweets and Desserts  Custard, smooth pudding. Flavored gelatin. Tapioca, junket. Plain ice cream, sherbet, fruit ices. Frozen ice pops, frozen fudge pops, pudding pops, and other frozen bars with cream. Syrups, including chocolate syrup. Sugar, honey, jelly. Fats and Oils  Margarine, butter, cream, sour cream, and oils. Other  Broth and cream soups. Strained, broth-based soups. The items listed above may not be a complete list of recommended foods or beverages. Contact your dietitian for more options.  What foods can I not eat? Grains  All breads. Grains are not allowed unless they are pureed into soup. Vegetables  Vegetables are not allowed unless they are juiced, or cooked and pureed into soup. Fruits  Fruits are not allowed unless they are juiced. Meats and Other Protein Sources  Any meat or fish. Cooked or raw eggs. Nut butters. Dairy  Cheese. Condiments  Stone ground mustards. Fats and Oils  Fats that are coarse or chunky. Sweets and Desserts  Ice cream or other frozen desserts that have any solids in them or on top, such as nuts, chocolate chips, and pieces of cookies. Cakes. Cookies. Candy. Others  Soups with chunks or pieces in them. The items listed above may not be a complete list of foods and beverages to avoid.   Contact your dietitian for more information.  This information is not intended to replace advice given to you by your health care provider. Make sure you discuss any questions you have with your health care provider. Document Released: 06/09/2005 Document Revised: 11/15/2015 Document Reviewed: 04/14/2013 Elsevier Interactive Patient Education  2017 Elsevier Inc.  

## 2017-04-14 ENCOUNTER — Ambulatory Visit: Payer: Medicare Other

## 2017-04-14 ENCOUNTER — Other Ambulatory Visit: Payer: Self-pay | Admitting: Nurse Practitioner

## 2017-04-14 LAB — CBC WITH DIFFERENTIAL/PLATELET
Basophils Absolute: 0 10*3/uL (ref 0.0–0.2)
Basos: 0 %
EOS (ABSOLUTE): 0 10*3/uL (ref 0.0–0.4)
EOS: 1 %
HEMATOCRIT: 33.7 % — AB (ref 34.0–46.6)
HEMOGLOBIN: 11.2 g/dL (ref 11.1–15.9)
IMMATURE GRANS (ABS): 0 10*3/uL (ref 0.0–0.1)
IMMATURE GRANULOCYTES: 0 %
Lymphocytes Absolute: 0.7 10*3/uL (ref 0.7–3.1)
Lymphs: 12 %
MCH: 29.4 pg (ref 26.6–33.0)
MCHC: 33.2 g/dL (ref 31.5–35.7)
MCV: 89 fL (ref 79–97)
MONOCYTES: 5 %
Monocytes Absolute: 0.3 10*3/uL (ref 0.1–0.9)
NEUTROS PCT: 82 %
Neutrophils Absolute: 4.7 10*3/uL (ref 1.4–7.0)
Platelets: 194 10*3/uL (ref 150–379)
RBC: 3.81 x10E6/uL (ref 3.77–5.28)
RDW: 20.8 % — AB (ref 12.3–15.4)
WBC: 5.8 10*3/uL (ref 3.4–10.8)

## 2017-04-14 LAB — CMP14+EGFR
ALBUMIN: 2.4 g/dL — AB (ref 3.5–4.8)
ALT: 10 IU/L (ref 0–32)
AST: 24 IU/L (ref 0–40)
Albumin/Globulin Ratio: 0.9 — ABNORMAL LOW (ref 1.2–2.2)
Alkaline Phosphatase: 291 IU/L — ABNORMAL HIGH (ref 39–117)
BUN/Creatinine Ratio: 24 (ref 12–28)
BUN: 19 mg/dL (ref 8–27)
Bilirubin Total: 0.3 mg/dL (ref 0.0–1.2)
CALCIUM: 8.8 mg/dL (ref 8.7–10.3)
CO2: 23 mmol/L (ref 20–29)
CREATININE: 0.78 mg/dL (ref 0.57–1.00)
Chloride: 101 mmol/L (ref 96–106)
GFR calc Af Amer: 87 mL/min/{1.73_m2} (ref >59)
GFR, EST NON AFRICAN AMERICAN: 76 mL/min/{1.73_m2} (ref >59)
GLOBULIN, TOTAL: 2.6 g/dL (ref 1.5–4.5)
Glucose: 225 mg/dL — ABNORMAL HIGH (ref 65–99)
Potassium: 4.5 mmol/L (ref 3.5–5.2)
SODIUM: 137 mmol/L (ref 134–144)
Total Protein: 5 g/dL — ABNORMAL LOW (ref 6.0–8.5)

## 2017-04-14 MED ORDER — INSULIN GLARGINE 300 UNIT/ML ~~LOC~~ SOPN: 10.0000 [IU] | PEN_INJECTOR | Freq: Every day | SUBCUTANEOUS | Status: AC

## 2017-04-15 ENCOUNTER — Ambulatory Visit: Payer: Medicare Other

## 2017-04-16 ENCOUNTER — Ambulatory Visit: Payer: Medicare Other

## 2017-04-16 ENCOUNTER — Other Ambulatory Visit: Payer: Self-pay | Admitting: Nurse Practitioner

## 2017-04-16 DIAGNOSIS — C250 Malignant neoplasm of head of pancreas: Secondary | ICD-10-CM

## 2017-04-17 ENCOUNTER — Ambulatory Visit: Payer: Medicare Other

## 2017-04-20 ENCOUNTER — Telehealth: Payer: Self-pay | Admitting: Oncology

## 2017-04-20 NOTE — Telephone Encounter (Signed)
Scheduled appt per 10/29 sch msg - patient is aware of the time to come in.

## 2017-04-22 ENCOUNTER — Other Ambulatory Visit (HOSPITAL_BASED_OUTPATIENT_CLINIC_OR_DEPARTMENT_OTHER): Payer: Medicare Other

## 2017-04-22 ENCOUNTER — Other Ambulatory Visit: Payer: Self-pay

## 2017-04-22 ENCOUNTER — Ambulatory Visit: Payer: Self-pay | Admitting: Oncology

## 2017-04-22 ENCOUNTER — Ambulatory Visit (HOSPITAL_BASED_OUTPATIENT_CLINIC_OR_DEPARTMENT_OTHER): Payer: Medicare Other

## 2017-04-22 ENCOUNTER — Ambulatory Visit (HOSPITAL_BASED_OUTPATIENT_CLINIC_OR_DEPARTMENT_OTHER): Payer: Medicare Other | Admitting: Oncology

## 2017-04-22 ENCOUNTER — Other Ambulatory Visit: Payer: Self-pay | Admitting: *Deleted

## 2017-04-22 ENCOUNTER — Telehealth: Payer: Self-pay | Admitting: *Deleted

## 2017-04-22 VITALS — BP 172/98 | HR 112 | Temp 98.8°F | Resp 18 | Ht 63.0 in

## 2017-04-22 DIAGNOSIS — C250 Malignant neoplasm of head of pancreas: Secondary | ICD-10-CM | POA: Diagnosis not present

## 2017-04-22 DIAGNOSIS — K311 Adult hypertrophic pyloric stenosis: Secondary | ICD-10-CM | POA: Diagnosis not present

## 2017-04-22 DIAGNOSIS — C257 Malignant neoplasm of other parts of pancreas: Secondary | ICD-10-CM

## 2017-04-22 DIAGNOSIS — R110 Nausea: Secondary | ICD-10-CM

## 2017-04-22 LAB — CBC WITH DIFFERENTIAL/PLATELET
BASO%: 0.4 % (ref 0.0–2.0)
Basophils Absolute: 0 10*3/uL (ref 0.0–0.1)
EOS ABS: 0 10*3/uL (ref 0.0–0.5)
EOS%: 0 % (ref 0.0–7.0)
HEMATOCRIT: 33.3 % — AB (ref 34.8–46.6)
HEMOGLOBIN: 11 g/dL — AB (ref 11.6–15.9)
LYMPH#: 0.5 10*3/uL — AB (ref 0.9–3.3)
LYMPH%: 6.3 % — AB (ref 14.0–49.7)
MCH: 29.5 pg (ref 25.1–34.0)
MCHC: 33 g/dL (ref 31.5–36.0)
MCV: 89.6 fL (ref 79.5–101.0)
MONO#: 0.5 10*3/uL (ref 0.1–0.9)
MONO%: 7 % (ref 0.0–14.0)
NEUT%: 86.3 % — ABNORMAL HIGH (ref 38.4–76.8)
NEUTROS ABS: 6.6 10*3/uL — AB (ref 1.5–6.5)
PLATELETS: 267 10*3/uL (ref 145–400)
RBC: 3.72 10*6/uL (ref 3.70–5.45)
RDW: 21 % — AB (ref 11.2–14.5)
WBC: 7.7 10*3/uL (ref 3.9–10.3)

## 2017-04-22 LAB — COMPREHENSIVE METABOLIC PANEL
ALBUMIN: 2.1 g/dL — AB (ref 3.5–5.0)
ALK PHOS: 565 U/L — AB (ref 40–150)
ALT: 19 U/L (ref 0–55)
AST: 40 U/L — ABNORMAL HIGH (ref 5–34)
Anion Gap: 10 mEq/L (ref 3–11)
BUN: 13.2 mg/dL (ref 7.0–26.0)
CALCIUM: 9.5 mg/dL (ref 8.4–10.4)
CO2: 29 mEq/L (ref 22–29)
Chloride: 98 mEq/L (ref 98–109)
Creatinine: 0.9 mg/dL (ref 0.6–1.1)
Glucose: 228 mg/dl — ABNORMAL HIGH (ref 70–140)
POTASSIUM: 4 meq/L (ref 3.5–5.1)
Sodium: 136 mEq/L (ref 136–145)
Total Bilirubin: 0.57 mg/dL (ref 0.20–1.20)
Total Protein: 5.9 g/dL — ABNORMAL LOW (ref 6.4–8.3)

## 2017-04-22 MED ORDER — ONDANSETRON HCL 8 MG PO TABS: 8.0000 mg | ORAL_TABLET | Freq: Three times a day (TID) | ORAL | 1 refills | Status: DC | PRN

## 2017-04-22 MED ORDER — ONDANSETRON HCL 8 MG PO TABS: 8.0000 mg | ORAL_TABLET | Freq: Three times a day (TID) | ORAL | 1 refills | Status: AC | PRN

## 2017-04-22 MED ORDER — LORAZEPAM 0.5 MG PO TABS: 0.5000 mg | ORAL_TABLET | ORAL | 1 refills | Status: AC | PRN

## 2017-04-22 MED ORDER — PROCHLORPERAZINE MALEATE 10 MG PO TABS: 10.0000 mg | ORAL_TABLET | Freq: Four times a day (QID) | ORAL | 1 refills | Status: AC | PRN

## 2017-04-22 MED ORDER — PANTOPRAZOLE SODIUM 40 MG PO TBEC: 40.0000 mg | DELAYED_RELEASE_TABLET | Freq: Every day | ORAL | 3 refills | Status: DC

## 2017-04-22 MED ORDER — ONDANSETRON HCL 8 MG PO TABS
ORAL_TABLET | ORAL | Status: AC
  Filled 2017-04-22: qty 1

## 2017-04-22 MED ORDER — PROCHLORPERAZINE MALEATE 10 MG PO TABS: 10.0000 mg | ORAL_TABLET | Freq: Four times a day (QID) | ORAL | 1 refills | Status: DC | PRN

## 2017-04-22 MED ORDER — PANTOPRAZOLE SODIUM 40 MG PO TBEC: 40.0000 mg | DELAYED_RELEASE_TABLET | Freq: Every day | ORAL | 3 refills | Status: AC

## 2017-04-22 MED ORDER — SODIUM CHLORIDE 0.9% FLUSH
10.0000 mL | INTRAVENOUS | Status: AC | PRN
  Administered 2017-04-22: 10 mL via INTRAVENOUS
  Filled 2017-04-22: qty 10

## 2017-04-22 MED ORDER — HEPARIN SOD (PORK) LOCK FLUSH 100 UNIT/ML IV SOLN
500.0000 [IU] | Freq: Once | INTRAVENOUS | Status: AC
  Administered 2017-04-22: 500 [IU] via INTRAVENOUS
  Filled 2017-04-22: qty 5

## 2017-04-22 MED ORDER — ONDANSETRON HCL 8 MG PO TABS
8.0000 mg | ORAL_TABLET | Freq: Once | ORAL | Status: AC
  Administered 2017-04-22: 8 mg via ORAL

## 2017-04-22 NOTE — Progress Notes (Signed)
[place] OFFICE PROGRESS NOTE   Diagnosis: Pancreas cancer   INTERVAL HISTORY:   Patricia Austin was discharged from the hospital on 04/09/2017 after an admission with gastric outlet obstruction. Dr. Yates Decamp performed an upper endoscopy while she was in the hospital. This confirmed severe duodenal obstruction that precluded placement of a stent. She is following a liquid diet. Despite a liquid diet she has intermittent episodes of vomiting. She and her husband have discussed hospice care with the [facility] hospice team.  Objective:  Vital signs in last 24 hours:  Blood pressure (!) 172/98, pulse (!) 112, temperature 98.8 F (37.1 C), temperature source Oral, resp. rate 18, height 5\' 3"  (1.6 m), SpO2 99 %.    HEENT: No thrush Resp: Lungs clear bilaterally Cardio: Regular rate and rhythm GI: Tender in the right upper abdomen, no mass Vascular: No leg edema Neuro: Diffuse weakness of the right leg   Portacath/PICC-without erythema  Lab Results:  Lab Results  Component Value Date   WBC 7.7 04/22/2017   HGB 11.0 (L) 04/22/2017   HCT 33.3 (L) 04/22/2017   MCV 89.6 04/22/2017   PLT 267 04/22/2017   NEUTROABS 6.6 (H) 04/22/2017    CMP     Component Value Date/Time   NA 136 04/22/2017 1014   K 4.0 04/22/2017 1014   CL 101 04/13/2017 1537   CO2 29 04/22/2017 1014   GLUCOSE 228 (H) 04/22/2017 1014   BUN 13.2 04/22/2017 1014   CREATININE 0.9 04/22/2017 1014   CALCIUM 9.5 04/22/2017 1014   PROT 5.9 (L) 04/22/2017 1014   ALBUMIN 2.1 (L) 04/22/2017 1014   AST 40 (H) 04/22/2017 1014   ALT 19 04/22/2017 1014   ALKPHOS 565 (H) 04/22/2017 1014   BILITOT 0.57 04/22/2017 1014   GFRNONAA 76 04/13/2017 1537   GFRAA 87 04/13/2017 1537     Medications: I have reviewed the patient's current medications.  Assessment/Plan: 1. Adenocarcinoma the pancreas-pancreas head/uncinatemass noted on a CT abdomen/pelvis 09/20/2016, status post an EUS biopsy 10/09/2016  confirming adenocarcinoma  CT 09/20/2016-pancreas head/uncinatemass, possible contact with the superior mesenteric artery, biliary dilatation, small low lung nodules  CTs chest, abdomen, and pelvis 10/27/2016-enlarging pancreas head mass with vascular abutment, no evidence of metastatic disease  Cycle 1 gemcitabine/Abraxane 11/25/2016  Cycle 2 gemcitabine/Abraxane 12/08/2016  Cycle 3 gemcitabine/Abraxane 12/22/2016  Cycle 4 gemcitabine/Abraxane 01/13/2017  Cycle 5 gemcitabine/Abraxane 01/26/2017  CT 02/04/2017-stable pancreas mass with abutment of the aorta, superior mesenteric artery, portal vein, and superior Mr. brain. No evidence of liver metastases. Stable right lower lobe nodule.  Radiation/Xeloda-beginning09/10/2016  CT 04/06/17 - interval progression of disease, new multifocal liver metastases; stable to slight increase in size of mass involving the head of pancreas, progressive mass-effect upon the duodenum is identified.   2. Biliary obstruction secondary to #1, status post placement of a wall stent and external biliary drain 10/09/2016  Biliary drain removed 10/20/2016  3. Diabetes-admitted with marked hyperglycemia 10/27/2016  4. History of squamous cell carcinoma of the scalp  5. Lumbar fusion surgery February 2018  6. 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 History of Abdominal/back pain secondary to pancreas cancer  8. Cholangitis 10/07/2016  9. Confusionduring hospital admission 10/27/2016-etiology unclear, MRI brain negative for metastatic disease on 10/29/2016, improved  10. Port-A-Cath placement 11/19/2016  11. Admission 12/30/2016 with dyspnea, chest CT consistent with pulmonary edema  12. Right leg weakness-MRI lumbar spine August 2018-no evidence of metastatic disease; superior endplate fracture at  T12. Loss of height 10%. No retropulsed bone. Consistent with a benign  osteoporotic fracture.  T12 kyphoplasty 03/11/2017   13.  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 appears unchanged. She has persistent symptoms of gastric outlet obstruction. She will continue a liquid diet. We discussed placement of a palliative gastrostomy tube if the vomiting becomes more frequent. Her performance status is poor. She is not a candidate for further systemic therapy.  We discussed hospice care. She agrees to a [facility] referral. She can be followed with Hospice by myself or her primary physician.  We discussed CPR and ACLS issues. She will be placed on a no CODE BLUE status.  We refilled prescriptions for anti-emetics.  [name] will return for an office visit in 2 weeks.  25 minutes were spent with the patient today. The majority of the time was used for counseling and coordination of care.  Donneta Romberg, MD  04/22/2017  12:00 PM

## 2017-04-22 NOTE — Addendum Note (Signed)
Addended by: Brien Few on: 04/22/2017 03:26 PM   Modules accepted: Orders

## 2017-04-22 NOTE — Telephone Encounter (Signed)
Spoke with Cassandra with [facility], they received an order from pt's PCP. Dr. Evette Doffing is listed as the provider. Informed her that pt will not receive any further treatment.

## 2017-04-23 ENCOUNTER — Telehealth: Payer: Self-pay | Admitting: Oncology

## 2017-04-23 NOTE — Progress Notes (Signed)
  Radiation Oncology         ([phone] ________________________________  Name: Patricia Austin MRN: [id number]  Date: 04/13/2017  DOB: 06/23/1946  End of Treatment Note  Diagnosis:   Pancreatic cancer     Indication for treatment::  curative       Radiation treatment dates:   02/25/17 - 04/07/17  Site/dose:   The abdomen was treated using an IMRT technique, 6 MV photons. Initially, she was planned to receive a 45 Gy plan, then followed by a 9 Gy boost  Narrative: The patient tolerated radiation treatment initially, but she began experiencing worsening GI issues as she proceeded. She was hospitalized and ultimately decided to discontinue treatment.  Plan: The patient follow-up prn.  ________________________________  Jodelle Gross, M.D., Ph.D.

## 2017-04-23 NOTE — Telephone Encounter (Signed)
Patient is aware of appt added per 10/31 los - per patient request - go ahead and cancel 11/8 appts.

## 2017-04-30 ENCOUNTER — Ambulatory Visit: Payer: Self-pay | Admitting: Oncology

## 2017-04-30 ENCOUNTER — Telehealth: Payer: Self-pay | Admitting: *Deleted

## 2017-04-30 ENCOUNTER — Other Ambulatory Visit: Payer: Self-pay

## 2017-04-30 NOTE — Telephone Encounter (Signed)
Received phone call from [facility] nurse stating that patient is having a hard time with nausea.  Hospice nurse is needing verbal order for 25mg  phenergan suppository every 4 hours and liquid lorazepam 1mg  every 3 hours as needed.  Verbal order given per Evelina Dun, FNP

## 2017-05-06 ENCOUNTER — Ambulatory Visit: Payer: Self-pay | Admitting: Oncology

## 2017-05-07 ENCOUNTER — Other Ambulatory Visit: Payer: Self-pay | Admitting: *Deleted

## 2017-05-07 DIAGNOSIS — Z789 Other specified health status: Secondary | ICD-10-CM

## 2017-05-07 NOTE — Progress Notes (Signed)
DNR scanned into chart  

## 2017-05-11 NOTE — Progress Notes (Signed)
PT Evaluation Addendum for G-Codes    25-Mar-2017 1201  PT G-Codes **NOT FOR INPATIENT CLASS**  Functional Assessment Tool Used AM-PAC 6 Clicks Basic Mobility  Functional Limitation Mobility: Walking and moving around  Mobility: Walking and Moving Around Current Status [phone]) CK  Mobility: Walking and Moving Around Goal Status [phone]) CJ    Rolinda Roan, PT, DPT Acute Rehabilitation Services Pager: [phone]

## 2017-05-23 DEATH — deceased

## 2017-05-29 ENCOUNTER — Other Ambulatory Visit: Payer: Self-pay | Admitting: Nurse Practitioner

## 2018-06-16 IMAGING — CT CT HEAD W/O CM
3 of 4 series · 15 of 47 positions shown, 18 images · non-contrast
Comparison: 06/02/2013

CLINICAL DATA: Weakness. Left-sided numbness and tingling for 3
days. History of diabetes and hypertension.

EXAM:
CT HEAD WITHOUT CONTRAST
TECHNIQUE: Contiguous axial images were obtained from the base of the skull
through the vertex without intravenous contrast.

[Series 2: head wo · axial · 0.49mm/px · z∈[+60,+190]mm · 9 of 32 slices shown, 12 images]
[im 3/32  brain]
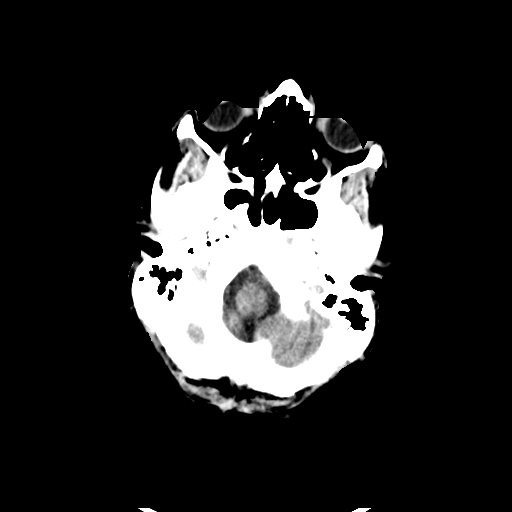
[im 3/32  bone]
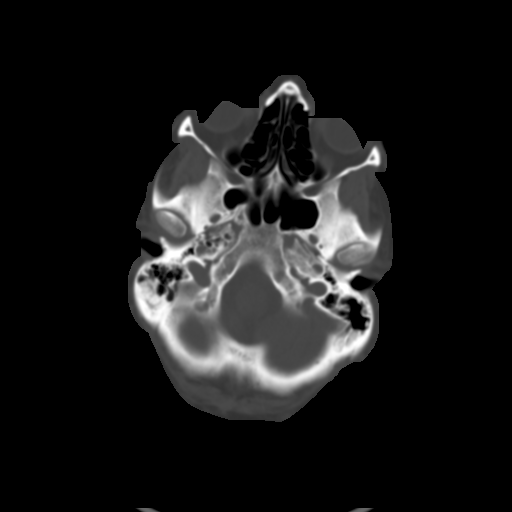
[im 7/32  brain]
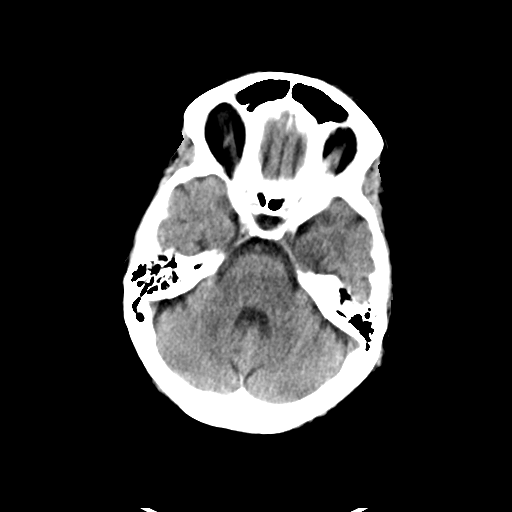
[im 9/32  brain]
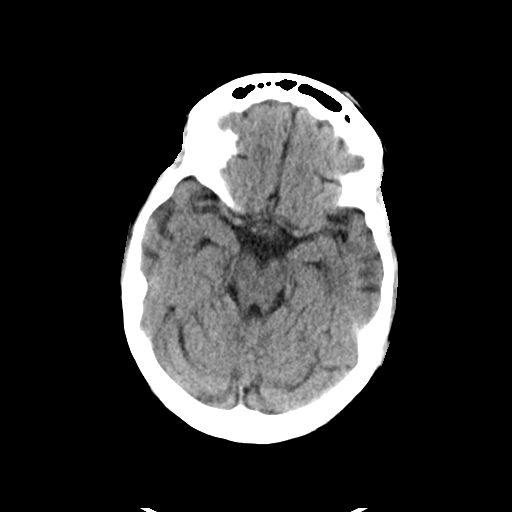
[im 14/32  brain]
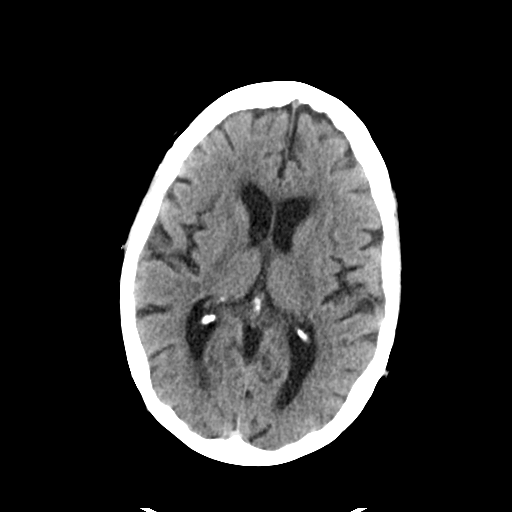
[im 16/32  brain]
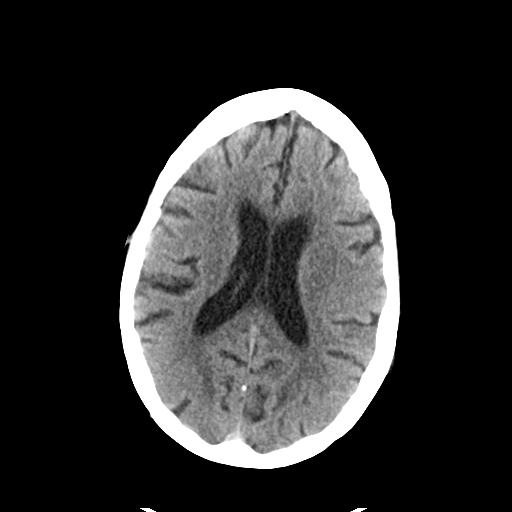
[im 16/32  bone]
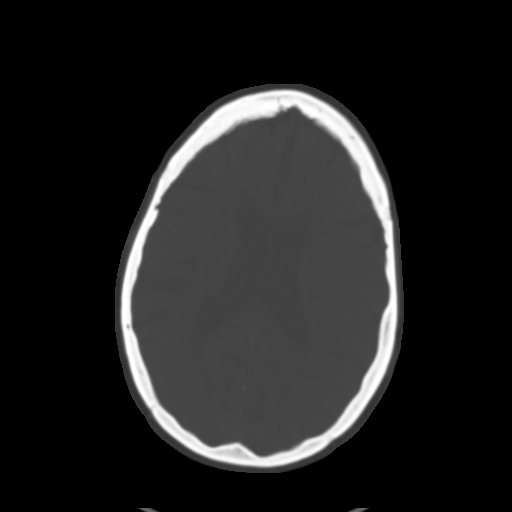
[im 18/32  brain]
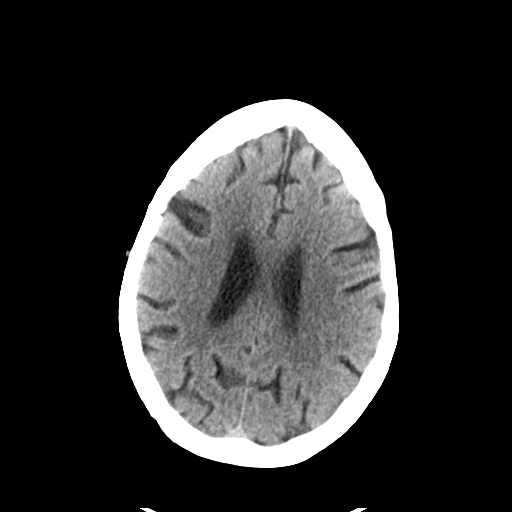
[im 23/32  brain]
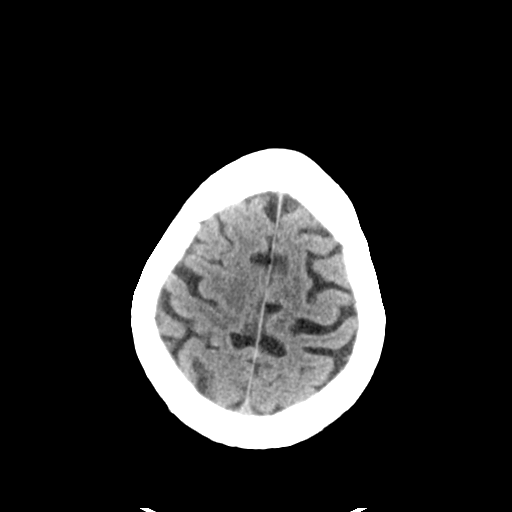
[im 25/32  brain]
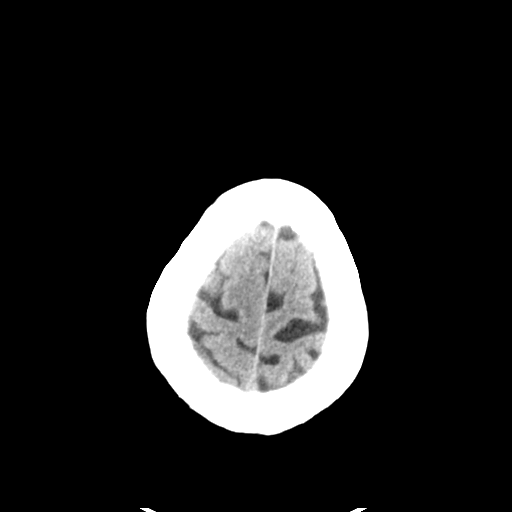
[im 29/32  brain]
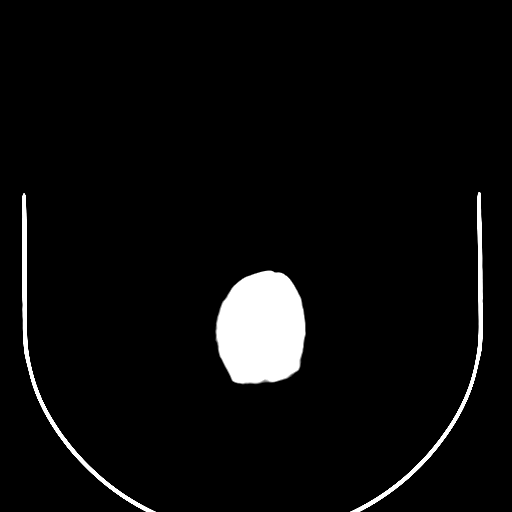
[im 29/32  bone]
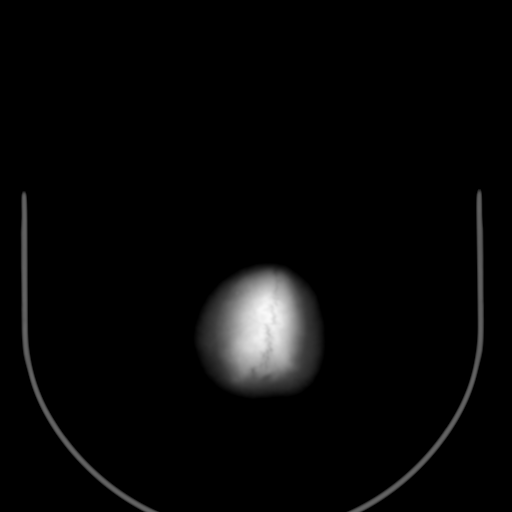

[Series 4: coronal soft tissue · coronal · 0.31mm/px · 3 of 67 slices shown]
[im 23/67  brain]
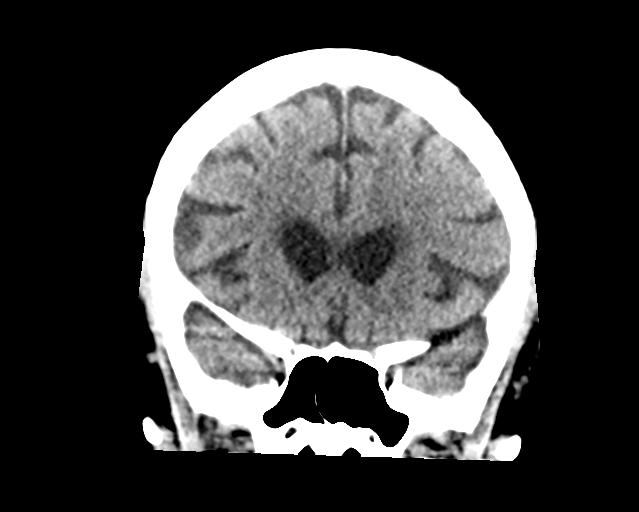
[im 30/67  brain]
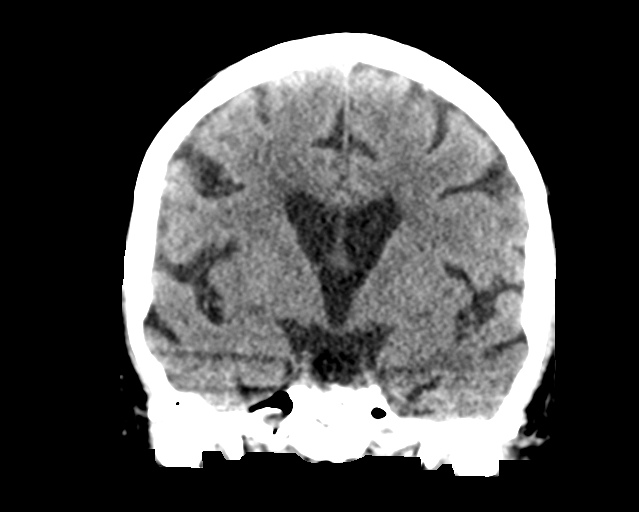
[im 37/67  brain]
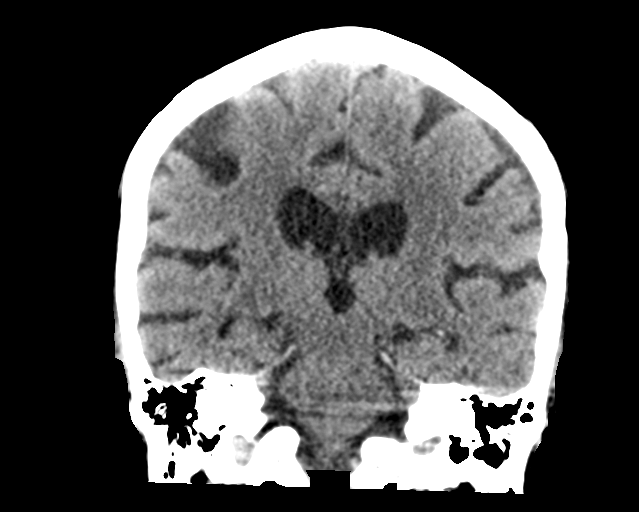

[Series 5: sagittal soft tissue · sagittal · 0.31mm/px · 3 of 64 slices shown]
[im 22/64  brain]
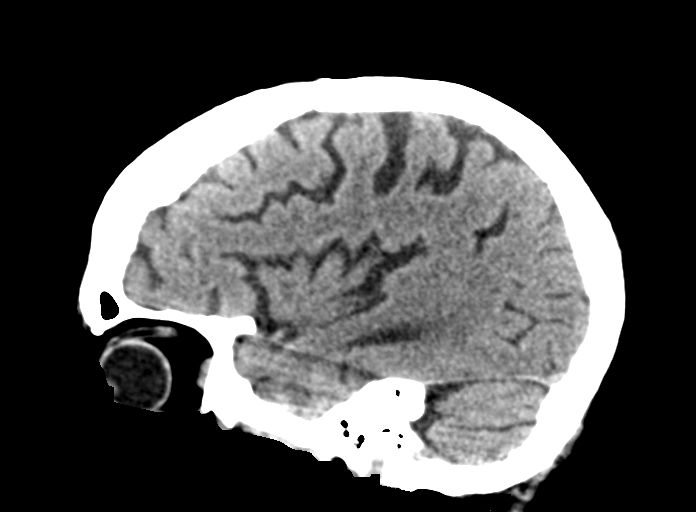
[im 32/64  brain]
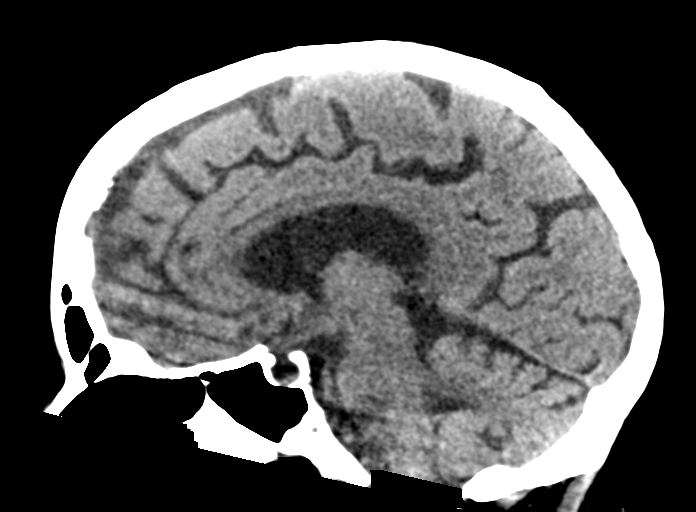
[im 43/64  brain]
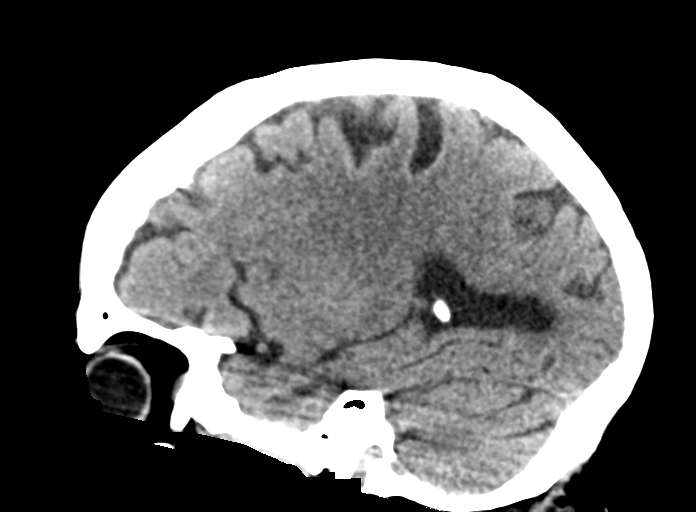

[15 of 47 positions shown; findings below may reference images not displayed]

FINDINGS: Brain: There is no evidence of acute cortical infarct, intracranial
hemorrhage, mass, midline shift, or extra-axial fluid collection.
Mild cerebral atrophy may have minimally progressed. Periventricular
white matter hypodensities also appear slightly increased and are
nonspecific but compatible with mild chronic small vessel ischemic
disease.

Vascular: Mild calcified atherosclerosis at the skullbase. No
hyperdense vessel.

Skull: No fracture or focal osseous lesion.

Sinuses/Orbits: Visualized paranasal sinuses are clear. Minimal
chronic air cell opacification at the mastoid tips. Unremarkable
orbits.

Other: None.
IMPRESSION: 1. No evidence of acute intracranial abnormality.
2. Mild chronic small vessel ischemic disease.

## 2018-06-26 IMAGING — DX DG CHEST 1V PORT
1 series · 1 of 1 positions shown · non-contrast
Comparison: Chest radiograph 08/20/2016

CLINICAL DATA: Fever

EXAM:
PORTABLE CHEST 1 VIEW

[chest ap]
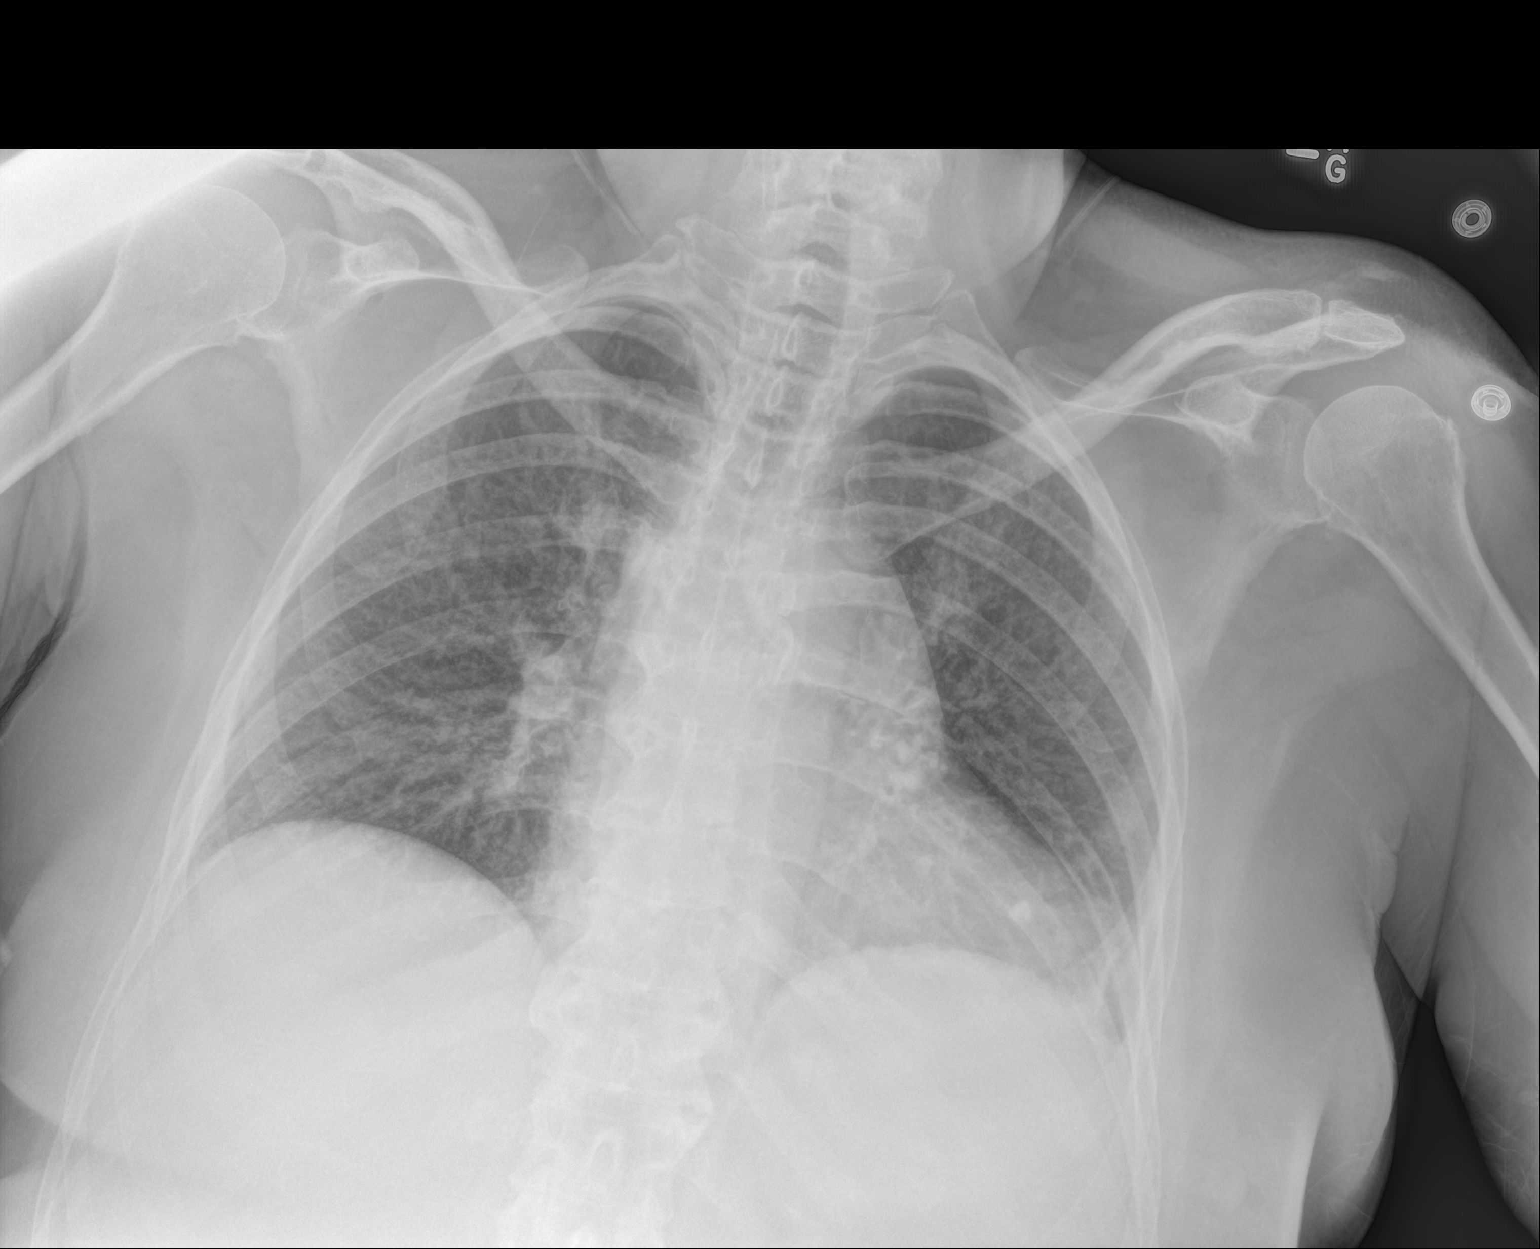

[1 of 1 positions shown; findings below may reference images not displayed]

FINDINGS: Cardiomediastinal silhouette remains mildly enlarged. There are
diffuse peribronchial opacities, likely indicating pulmonary edema.
Unchanged left basilar calcified granuloma. No pneumothorax or
sizable pleural effusion.
IMPRESSION: Shallow lung inflation with mild pulmonary edema.
